# Patient Record
Sex: Female | Born: 1945
Health system: Southern US, Community
[De-identification: ages and names within clinical notes are randomized; demographics above are authoritative.]

## PROBLEM LIST (undated history)

## (undated) DIAGNOSIS — I10 Essential (primary) hypertension: Secondary | ICD-10-CM

## (undated) DIAGNOSIS — E785 Hyperlipidemia, unspecified: Secondary | ICD-10-CM

## (undated) DIAGNOSIS — S82891A Other fracture of right lower leg, initial encounter for closed fracture: Secondary | ICD-10-CM

## (undated) DIAGNOSIS — M8580 Other specified disorders of bone density and structure, unspecified site: Secondary | ICD-10-CM

## (undated) DIAGNOSIS — M109 Gout, unspecified: Secondary | ICD-10-CM

## (undated) DIAGNOSIS — E119 Type 2 diabetes mellitus without complications: Secondary | ICD-10-CM

## (undated) HISTORY — DX: Type 2 diabetes mellitus without complications: E11.9

## (undated) HISTORY — DX: Essential (primary) hypertension: I10

## (undated) HISTORY — DX: Other fracture of right lower leg, initial encounter for closed fracture: S82.891A

## (undated) HISTORY — DX: Other specified disorders of bone density and structure, unspecified site: M85.80

## (undated) HISTORY — PX: ABDOMINAL HYSTERECTOMY: SHX81

## (undated) HISTORY — DX: Hyperlipidemia, unspecified: E78.5

## (undated) HISTORY — DX: Gout, unspecified: M10.9

---

## 1998-11-10 ENCOUNTER — Other Ambulatory Visit: Admission: RE | Admit: 1998-11-10 | Discharge: 1998-11-10 | Payer: Self-pay

## 1999-05-25 ENCOUNTER — Ambulatory Visit (HOSPITAL_COMMUNITY): Admission: RE | Admit: 1999-05-25 | Discharge: 1999-05-25 | Payer: Self-pay | Admitting: General Surgery

## 1999-05-25 ENCOUNTER — Encounter: Payer: Self-pay | Admitting: General Surgery

## 2001-12-09 ENCOUNTER — Inpatient Hospital Stay (HOSPITAL_COMMUNITY): Admission: EM | Admit: 2001-12-09 | Discharge: 2001-12-12 | Payer: Self-pay | Admitting: *Deleted

## 2001-12-09 ENCOUNTER — Encounter: Payer: Self-pay | Admitting: Cardiology

## 2001-12-11 ENCOUNTER — Encounter: Payer: Self-pay | Admitting: Cardiology

## 2003-12-13 ENCOUNTER — Other Ambulatory Visit: Admission: RE | Admit: 2003-12-13 | Discharge: 2003-12-13 | Payer: Self-pay | Admitting: Family Medicine

## 2004-12-31 DIAGNOSIS — M109 Gout, unspecified: Secondary | ICD-10-CM

## 2004-12-31 HISTORY — DX: Gout, unspecified: M10.9

## 2013-05-15 ENCOUNTER — Other Ambulatory Visit: Payer: Self-pay | Admitting: *Deleted

## 2013-05-15 MED ORDER — ALLOPURINOL 300 MG PO TABS: 300.0000 mg | ORAL_TABLET | Freq: Every day | ORAL | Status: DC

## 2013-05-18 MED ORDER — ALLOPURINOL 300 MG PO TABS: 300.0000 mg | ORAL_TABLET | Freq: Every day | ORAL | Status: DC

## 2013-05-21 ENCOUNTER — Other Ambulatory Visit: Payer: Self-pay

## 2013-05-21 MED ORDER — ALLOPURINOL 300 MG PO TABS: 300.0000 mg | ORAL_TABLET | Freq: Every day | ORAL | Status: DC

## 2013-06-27 ENCOUNTER — Other Ambulatory Visit: Payer: Self-pay | Admitting: Physician Assistant

## 2013-06-29 ENCOUNTER — Other Ambulatory Visit: Payer: Self-pay

## 2013-06-29 NOTE — Telephone Encounter (Signed)
Rf denied. Pt needs appt Says seen by me on 6/13 and I was not in office that day and no record in chart

## 2013-06-29 NOTE — Telephone Encounter (Signed)
Last seen 6/13  [facility]

## 2013-07-07 ENCOUNTER — Other Ambulatory Visit: Payer: Self-pay | Admitting: *Deleted

## 2013-09-23 ENCOUNTER — Other Ambulatory Visit: Payer: Self-pay | Admitting: *Deleted

## 2013-09-23 MED ORDER — LISINOPRIL 20 MG PO TABS: ORAL_TABLET | ORAL | Status: DC

## 2013-09-23 NOTE — Telephone Encounter (Signed)
LAST OV 06/05/12. NTBS. MED NOT IN EPIC BUT IN PAPER CHART FOR ZESTRIL 20MG  ONE TABLET TWICE DAILY. THANKS.

## 2013-10-22 ENCOUNTER — Encounter: Payer: Self-pay | Admitting: Nurse Practitioner

## 2013-10-22 ENCOUNTER — Ambulatory Visit (INDEPENDENT_AMBULATORY_CARE_PROVIDER_SITE_OTHER): Payer: Medicare Other | Admitting: Nurse Practitioner

## 2013-10-22 ENCOUNTER — Encounter (INDEPENDENT_AMBULATORY_CARE_PROVIDER_SITE_OTHER): Payer: Self-pay

## 2013-10-22 VITALS — BP 156/86 | HR 70 | Temp 97.9°F | Ht 64.0 in | Wt 199.0 lb

## 2013-10-22 DIAGNOSIS — I10 Essential (primary) hypertension: Secondary | ICD-10-CM

## 2013-10-22 DIAGNOSIS — E785 Hyperlipidemia, unspecified: Secondary | ICD-10-CM

## 2013-10-22 DIAGNOSIS — R7309 Other abnormal glucose: Secondary | ICD-10-CM

## 2013-10-22 DIAGNOSIS — E559 Vitamin D deficiency, unspecified: Secondary | ICD-10-CM

## 2013-10-22 DIAGNOSIS — M109 Gout, unspecified: Secondary | ICD-10-CM

## 2013-10-22 DIAGNOSIS — R7989 Other specified abnormal findings of blood chemistry: Secondary | ICD-10-CM

## 2013-10-22 MED ORDER — LISINOPRIL 40 MG PO TABS: 40.0000 mg | ORAL_TABLET | Freq: Every day | ORAL | Status: DC

## 2013-10-22 MED ORDER — ALLOPURINOL 300 MG PO TABS: 300.0000 mg | ORAL_TABLET | Freq: Every day | ORAL | Status: DC

## 2013-10-22 MED ORDER — FENOFIBRATE 145 MG PO TABS: 145.0000 mg | ORAL_TABLET | Freq: Every day | ORAL | Status: DC

## 2013-10-22 NOTE — Progress Notes (Signed)
Subjective:    Patient ID: Katelyn Ballard, female    DOB: September 03, 1945, 68 y.o.   MRN: [id number]  Hyperlipidemia This is a chronic problem. The current episode started more than 1 year ago. The problem is uncontrolled. Recent lipid tests were reviewed and are high. Exacerbating diseases include obesity. She has no history of diabetes or liver disease. Factors aggravating her hyperlipidemia include fatty foods. Pertinent negatives include no focal sensory loss, leg pain, myalgias or shortness of breath. Current antihyperlipidemic treatment includes fibric acid derivatives. The current treatment provides mild improvement of lipids. Risk factors for coronary artery disease include hypertension, obesity, post-menopausal and dyslipidemia.  Hypertension This is a chronic problem. The current episode started more than 1 year ago. The problem has been waxing and waning since onset. The problem is uncontrolled. Pertinent negatives include no anxiety, palpitations, peripheral edema or shortness of breath. Risk factors for coronary artery disease include obesity, post-menopausal state, dyslipidemia and family history. Past treatments include ACE inhibitors (patient suppose to beon 2 tablets a day an dhas only been taking one because afraid she would run out of meds.). The current treatment provides mild improvement. There is no history of a thyroid problem. There is no history of sleep apnea.   Gout Pt takes Allopurinol daily-Pt states her left pinky finger is slightly tender and pain in left ankle that feels like her "gout acting up". Pt has been out of medication for the last two months.   Review of Systems  Respiratory: Negative for shortness of breath.   Cardiovascular: Negative for palpitations.  Musculoskeletal: Negative for myalgias.  All other systems reviewed and are negative.       Objective:   Physical Exam  Vitals reviewed. Constitutional: She is oriented to person, place, and time. She  appears well-developed and well-nourished.  HENT:  Head: Normocephalic.  Right Ear: External ear normal.  Left Ear: External ear normal.  Mouth/Throat: Oropharynx is clear and moist.  Eyes: Pupils are equal, round, and reactive to light.  Neck: Normal range of motion. Neck supple. No thyromegaly present.  Cardiovascular: Normal rate, regular rhythm, normal heart sounds and intact distal pulses.   Pulmonary/Chest: Effort normal and breath sounds normal.  Abdominal: Soft. Bowel sounds are normal. She exhibits no distension. There is no tenderness.  Musculoskeletal: Normal range of motion. She exhibits no edema and no tenderness.  Neurological: She is alert and oriented to person, place, and time.  Skin: Skin is warm and dry.  Psychiatric: She has a normal mood and affect. Her behavior is normal. Judgment and thought content normal.     BP 156/86  Pulse 70  Temp(Src) 97.9 F (36.6 C) (Oral)  Ht 5\' 4"  (1.626 m)  Wt 199 lb (90.266 kg)  BMI 34.14 kg/m2       Assessment & Plan:   1. Hypertension   2. Hyperlipidemia LDL goal < 100   3. Gout   4. Unspecified vitamin D deficiency    Orders Placed This Encounter  Procedures  . CMP14+EGFR  . NMR, lipoprofile  . Vit D  25 hydroxy (rtn osteoporosis monitoring)   Meds ordered this encounter  Medications  . DISCONTD: fenofibrate (TRICOR) 145 MG tablet    Sig: Take 145 mg by mouth daily.  Marland Kitchen allopurinol (ZYLOPRIM) 300 MG tablet    Sig: Take 1 tablet (300 mg total) by mouth daily.    Dispense:  90 tablet    Refill:  1    Order Specific Question:  Supervising  Provider    Answer:  Ernestina Penna [[id number]]  . fenofibrate (TRICOR) 145 MG tablet    Sig: Take 1 tablet (145 mg total) by mouth daily.    Dispense:  90 tablet    Refill:  1    Order Specific Question:  Supervising Provider    Answer:  Ernestina Penna [[id number]]  . lisinopril (PRINIVIL,ZESTRIL) 40 MG tablet    Sig: Take 1 tablet (40 mg total) by mouth daily.    Dispense:  90  tablet    Refill:  1    Order Specific Question:  Supervising Provider    Answer:  Deborra Medina    Continue all meds Labs pending Diet and exercise encouraged Health maintenance reviewed Follow up in 3 months  Mary-Margaret Daphine Deutscher, FNP

## 2013-10-22 NOTE — Patient Instructions (Signed)

## 2013-10-23 LAB — CMP14+EGFR
Albumin: 4.2 g/dL (ref 3.6–4.8)
Alkaline Phosphatase: 79 IU/L (ref 39–117)
BUN/Creatinine Ratio: 27 — ABNORMAL HIGH (ref 11–26)
BUN: 20 mg/dL (ref 8–27)
CO2: 21 mmol/L (ref 18–29)
Calcium: 9.6 mg/dL (ref 8.6–10.2)
Chloride: 102 mmol/L (ref 97–108)
Creatinine, Ser: 0.73 mg/dL (ref 0.57–1.00)
GFR calc Af Amer: 97 mL/min/{1.73_m2} (ref >59)
Globulin, Total: 2.9 g/dL (ref 1.5–4.5)
Glucose: 229 mg/dL — ABNORMAL HIGH (ref 65–99)
Total Bilirubin: 0.3 mg/dL (ref 0.0–1.2)
Total Protein: 7.1 g/dL (ref 6.0–8.5)

## 2013-10-23 LAB — NMR, LIPOPROFILE
Cholesterol: 239 mg/dL — ABNORMAL HIGH (ref <200)
HDL Particle Number: 33.4 umol/L (ref >=30.5)
LDLC SERPL CALC-MCNC: 152 mg/dL — ABNORMAL HIGH (ref <100)
LP-IR Score: 59 — ABNORMAL HIGH (ref <=45)
Triglycerides by NMR: 264 mg/dL — ABNORMAL HIGH (ref <150)

## 2013-10-26 ENCOUNTER — Other Ambulatory Visit: Payer: Self-pay | Admitting: Nurse Practitioner

## 2013-10-26 MED ORDER — VITAMIN D (ERGOCALCIFEROL) 1.25 MG (50000 UNIT) PO CAPS: 50000.0000 [IU] | ORAL_CAPSULE | ORAL | Status: DC

## 2013-10-26 MED ORDER — ATORVASTATIN CALCIUM 40 MG PO TABS: 40.0000 mg | ORAL_TABLET | Freq: Every day | ORAL | Status: DC

## 2013-10-27 ENCOUNTER — Telehealth: Payer: Self-pay | Admitting: Nurse Practitioner

## 2013-10-27 NOTE — Telephone Encounter (Signed)
What was on before- no other statin in chart

## 2013-10-31 ENCOUNTER — Telehealth: Payer: Self-pay | Admitting: Nurse Practitioner

## 2013-11-02 NOTE — Telephone Encounter (Signed)
Patient on tricor not antara according to chart which is she taking?

## 2013-11-03 ENCOUNTER — Other Ambulatory Visit: Payer: Self-pay | Admitting: Nurse Practitioner

## 2013-11-03 NOTE — Telephone Encounter (Signed)
Left details on vm. Ask her to call back with medication list and name of provider who prescribed for her,( Antara.)

## 2013-11-04 ENCOUNTER — Other Ambulatory Visit: Payer: Self-pay | Admitting: Nurse Practitioner

## 2013-11-04 MED ORDER — METFORMIN HCL 500 MG PO TABS: 500.0000 mg | ORAL_TABLET | Freq: Two times a day (BID) | ORAL | Status: DC

## 2013-11-04 NOTE — Addendum Note (Signed)
Addended by: Orma Render F on: 11/04/2013 08:31 AM   Modules accepted: Orders

## 2013-11-24 ENCOUNTER — Ambulatory Visit (INDEPENDENT_AMBULATORY_CARE_PROVIDER_SITE_OTHER): Payer: Medicare Other | Admitting: Pharmacist Clinician (PhC)/ Clinical Pharmacy Specialist

## 2013-11-24 DIAGNOSIS — E785 Hyperlipidemia, unspecified: Secondary | ICD-10-CM

## 2013-11-24 DIAGNOSIS — E1065 Type 1 diabetes mellitus with hyperglycemia: Secondary | ICD-10-CM

## 2013-11-24 DIAGNOSIS — IMO0002 Reserved for concepts with insufficient information to code with codable children: Secondary | ICD-10-CM

## 2013-11-24 NOTE — Progress Notes (Signed)
Subjective:    Patient ID: Katelyn Ballard, female    DOB: 29-Jun-1945, 68 y.o.   MRN: [id number]  HPI:  Diagnosed with type 2 DM one month ago.  Mother passed away from complications secondary to diabetes.  Strong family history present.    Review of Systems  Constitutional: Negative.   HENT: Negative.   Eyes: Negative.   Respiratory: Negative.   Cardiovascular: Negative.   Endocrine: Positive for polydipsia and polyuria.  Musculoskeletal: Positive for myalgias.  Skin: Negative.   Allergic/Immunologic: Negative.   Neurological: Negative.        Objective:   Physical Exam  Constitutional: She is oriented to person, place, and time. She appears well-developed and well-nourished.  Cardiovascular: Normal rate and regular rhythm.   Pulmonary/Chest: Effort normal and breath sounds normal.  Neurological: She is alert and oriented to person, place, and time.  Skin: Skin is warm and dry.  Psychiatric: She has a normal mood and affect. Her behavior is normal. Judgment and thought content normal.          Assessment & Plan:   Diabetes Follow-Up Visit Chief Complaint:  No chief complaint on file.    Exam Regularity:  RRR Edema:  neg  Polyuria:  pos  Polydipsia:  pos Polyphagia:  neg  BMI:  There is no weight on file to calculate BMI.   Weight changes:  Stable obese General Appearance:  alert, oriented, no acute distress and obese Mood/Affect:  normal  HPI:  New onset type 2 DM  Low fat/carbohydrate diet?  No Nicotine Abuse?  No Medication Compliance?  Yes Exercise?  No Alcohol Abuse?  No  Home BG Monitoring:  Checking 0 times a day. Average:  n/a  High: n/a  Low:  N/a    Lab Results  Component Value Date   HGBA1C 9.2% 11/04/2013    No results found for this basename: MICROALBUR, [id number]    Lab Results  Component Value Date   CHOL 239* 10/22/2013      Assessment: 1.  Diabetes:  Newly Diagnosed and knows very little about diabetes. 2.  Blood Pressure. At  goal and taking an ACEI 3.  Lipids.  Elevated LDL-P on fenofibrate.  Will need evaluation for statin therapy at next visit 4.  Foot Care.  Reviewed daily foot care with patient.  No current problems 5.  Dental Care.  Some missing teeth needs to see a dentist 6.  Eye Care/Exam.  Recommended annual exams  Recommendations: 1.  Patient is counseled on appropriate foot care. 2.  BP goal < 130/80. 3.  LDL goal of < 100, HDL > 40 and TG < 150. 4.  Eye Exam yearly and Dental Exam every 6 months. 5.  Dietary recommendations:  1800 calorie diet to start with.  Extensively counseled patient on CHO and meal planning for over 30 minutes. 6.  Physical Activity recommendations:  Patient has gout and foot problems.  Will do chair exercises 15 minutes bid 7.  Medication recommendations at this time are as follows:  Continue metformin 500mg  bid.  Patient to keep a food diary with blood glucose readings and bring in to me in two weeks for follow up and evaluation of changes that need to be made.  She was given a Patent examiner today with teaching. 8.  Return to clinic in 2 wks   Time spent counseling patient:  45 min  Physician time spent with patient:    Referring provider:  Daphine Deutscher   PharmD:  [facility] Pharmacist

## 2013-12-15 ENCOUNTER — Ambulatory Visit (INDEPENDENT_AMBULATORY_CARE_PROVIDER_SITE_OTHER): Payer: Medicare Other | Admitting: Pharmacist Clinician (PhC)/ Clinical Pharmacy Specialist

## 2013-12-15 ENCOUNTER — Encounter: Payer: Self-pay | Admitting: Pharmacist Clinician (PhC)/ Clinical Pharmacy Specialist

## 2013-12-15 ENCOUNTER — Telehealth: Payer: Self-pay | Admitting: Pharmacist Clinician (PhC)/ Clinical Pharmacy Specialist

## 2013-12-15 VITALS — BP 155/72 | Wt 192.0 lb

## 2013-12-15 DIAGNOSIS — E785 Hyperlipidemia, unspecified: Secondary | ICD-10-CM

## 2013-12-15 DIAGNOSIS — I10 Essential (primary) hypertension: Secondary | ICD-10-CM

## 2013-12-15 DIAGNOSIS — E119 Type 2 diabetes mellitus without complications: Secondary | ICD-10-CM

## 2013-12-15 MED ORDER — AMLODIPINE BESYLATE 2.5 MG PO TABS: 2.5000 mg | ORAL_TABLET | Freq: Every day | ORAL | Status: DC

## 2013-12-15 NOTE — Addendum Note (Signed)
Addended by: Orma Render F on: 12/15/2013 11:25 AM   Modules accepted: Orders

## 2013-12-15 NOTE — Progress Notes (Addendum)
   Subjective:    Patient ID: Katelyn Ballard, female    DOB: [birth year], 68 y.o.   MRN: [id number]  HPI:  New Diabetic follow up today.  Patient did not bring food diary or blood glucose readings.    Review of Systems  Constitutional: Negative.   HENT: Negative.   Eyes: Negative.   Respiratory: Negative.   Gastrointestinal: Negative.   Musculoskeletal: Negative.   Skin: Negative.   Neurological: Negative.   Psychiatric/Behavioral: Negative.        Objective:   Physical Exam  Constitutional: She is oriented to person, place, and time. She appears well-developed and well-nourished.  Neurological: She is alert and oriented to person, place, and time.  Skin: Skin is warm and dry.  Psychiatric: She has a normal mood and affect. Her behavior is normal. Judgment and thought content normal.          Assessment & Plan:   Diabetes Follow-Up Visit Chief Complaint:  No chief complaint on file.    Exam Regularity:  RRR Neg  Edema:  neg  Polyuria:  Neg   Polydipsia:  neg Polyphagia:  neg  BMI:  There is no weight on file to calculate BMI.   Weight changes:  stable General Appearance:  alert, oriented, no acute distress Mood/Affect:  normal  HPI:  New onset type 2 diabetes mellitus  Low fat/carbohydrate diet?  Yes Nicotine Abuse?  No Medication Compliance?  Yes Exercise?  No Alcohol Abuse?  No  Home BG Monitoring:  Checking 2 times a day. Average:  145mg /dL  High: 235mg /dL  Low:  111mg /dL   Lab Results  Component Value Date   HGBA1C 9.2% 11/04/2013    No results found for this basename: MICROALBUR, [id number]    Lab Results  Component Value Date   CHOL 239* 10/22/2013      Assessment: 1.  Diabetes.  Huge improvement in diet and CHO monitoring  2.  Blood Pressure.  At goal 3.  Lipids.  Pending needs to be checked  4.  Foot Care.  Reviewed daily care 5.  Dental Care.  annaul 6.  Eye Care/Exam.  annual  Recommendations: 1.  Patient is counseled on  appropriate foot care. 2.  BP goal < 130/80. 3.  LDL goal of < 100, HDL > 40 and TG < 150. 4.  Eye Exam yearly and Dental Exam every 6 months. 5.  Dietary recommendations:  Continue CHO meal planning 6.  Physical Activity recommendations:  15-20 minutes of exercise a day 7.  Medication recommendations at this time are as follows:  Continue same 8.  Return to clinic in 4-6 wks 9.  Will re-check lipids in January and start statin therapy if elevated.  Plan to use pravastatin since she has had myalgias with lipitor  And crestor. 10.  Added amlodipine 2.5mg  since systolic readings are staying between 155-162.  Diastolics are 70-75.   Time spent counseling patient:  35 minutes  Physician time spent with patient:  0 Referring provider:  Gennette Pac   PharmD:  [facility] Pharmacist

## 2013-12-17 LAB — NMR, LIPOPROFILE
Cholesterol: 175 mg/dL (ref <200)
HDL Cholesterol by NMR: 38 mg/dL — ABNORMAL LOW (ref >=40)
HDL Particle Number: 30.6 umol/L (ref >=30.5)
LDL Particle Number: 1905 nmol/L — ABNORMAL HIGH (ref <1000)
LDL Size: 20.7 nm (ref >20.5)
LDLC SERPL CALC-MCNC: 108 mg/dL — ABNORMAL HIGH (ref <100)
LP-IR Score: 58 — ABNORMAL HIGH (ref <=45)
Small LDL Particle Number: 873 nmol/L — ABNORMAL HIGH (ref <=527)
Triglycerides by NMR: 146 mg/dL (ref <150)

## 2013-12-17 LAB — HEMOGLOBIN A1C
Est. average glucose Bld gHb Est-mCnc: 203 mg/dL
Hgb A1c MFr Bld: 8.7 % — ABNORMAL HIGH (ref 4.8–5.6)

## 2013-12-17 LAB — BASIC METABOLIC PANEL
BUN/Creatinine Ratio: 27 — ABNORMAL HIGH (ref 11–26)
Calcium: 9.2 mg/dL (ref 8.6–10.2)
Chloride: 108 mmol/L (ref 97–108)
GFR calc non Af Amer: 70 mL/min/{1.73_m2} (ref >59)
Glucose: 119 mg/dL — ABNORMAL HIGH (ref 65–99)
Potassium: 4.1 mmol/L (ref 3.5–5.2)

## 2013-12-22 NOTE — Telephone Encounter (Signed)
Excellent improvement in all lab parameters.  A1C droppped after only one month since last check.  Lipids significantly improved since last check.  Will continue diet and medications and re-check in 10-12 weeks

## 2014-01-22 ENCOUNTER — Encounter: Payer: Self-pay | Admitting: Nurse Practitioner

## 2014-01-22 ENCOUNTER — Ambulatory Visit (INDEPENDENT_AMBULATORY_CARE_PROVIDER_SITE_OTHER): Payer: Medicare Other | Admitting: Nurse Practitioner

## 2014-01-22 ENCOUNTER — Encounter (INDEPENDENT_AMBULATORY_CARE_PROVIDER_SITE_OTHER): Payer: Self-pay

## 2014-01-22 VITALS — BP 146/79 | HR 65 | Temp 98.1°F | Ht 64.0 in | Wt 186.0 lb

## 2014-01-22 DIAGNOSIS — E785 Hyperlipidemia, unspecified: Secondary | ICD-10-CM

## 2014-01-22 DIAGNOSIS — M109 Gout, unspecified: Secondary | ICD-10-CM

## 2014-01-22 DIAGNOSIS — I10 Essential (primary) hypertension: Secondary | ICD-10-CM

## 2014-01-22 MED ORDER — ALLOPURINOL 300 MG PO TABS: 300.0000 mg | ORAL_TABLET | Freq: Every day | ORAL | Status: DC

## 2014-01-22 MED ORDER — FENOFIBRATE 145 MG PO TABS: 145.0000 mg | ORAL_TABLET | Freq: Every day | ORAL | Status: DC

## 2014-01-22 MED ORDER — METFORMIN HCL 500 MG PO TABS: 500.0000 mg | ORAL_TABLET | Freq: Two times a day (BID) | ORAL | Status: DC

## 2014-01-22 MED ORDER — AMLODIPINE BESYLATE 2.5 MG PO TABS: 2.5000 mg | ORAL_TABLET | Freq: Every day | ORAL | Status: DC

## 2014-01-22 MED ORDER — LISINOPRIL 40 MG PO TABS: 40.0000 mg | ORAL_TABLET | Freq: Every day | ORAL | Status: DC

## 2014-01-22 NOTE — Progress Notes (Signed)
Subjective:    Patient ID: Katelyn Ballard, female    DOB: 1945/02/09, 69 y.o.   MRN: [id number]  Patient here today for follow up- no complaints and no changes since last visit.  Hyperlipidemia This is a chronic problem. The current episode started more than 1 year ago. The problem is uncontrolled. Recent lipid tests were reviewed and are high. Exacerbating diseases include obesity. She has no history of diabetes or liver disease. Factors aggravating her hyperlipidemia include fatty foods. Pertinent negatives include no chest pain, focal sensory loss, leg pain, myalgias or shortness of breath. Current antihyperlipidemic treatment includes fibric acid derivatives. The current treatment provides mild improvement of lipids. Risk factors for coronary artery disease include hypertension, obesity, post-menopausal and dyslipidemia.  Hypertension This is a chronic problem. The current episode started more than 1 year ago. The problem has been waxing and waning since onset. The problem is uncontrolled. Pertinent negatives include no anxiety, chest pain, palpitations, peripheral edema or shortness of breath. Risk factors for coronary artery disease include obesity, post-menopausal state, dyslipidemia and family history. Past treatments include ACE inhibitors (patient suppose to beon 2 tablets a day an dhas only been taking one because afraid she would run out of meds.). The current treatment provides mild improvement. There is no history of a thyroid problem. There is no history of sleep apnea.  Diabetes She presents for her follow-up diabetic visit. She has type 2 diabetes mellitus. No MedicAlert identification noted. Her disease course has been stable. There are no hypoglycemic associated symptoms. Pertinent negatives for diabetes include no chest pain, no foot paresthesias, no polydipsia, no polyphagia, no polyuria, no visual change and no weight loss. There are no hypoglycemic complications. Symptoms are  stable. There are no diabetic complications. Risk factors for coronary artery disease include diabetes mellitus, dyslipidemia, family history, hypertension and post-menopausal. Current diabetic treatment includes oral agent (monotherapy). She is compliant with treatment most of the time. She has not had a previous visit with a dietician. She rarely participates in exercise. There is no change in her home blood glucose trend. Her breakfast blood glucose is taken between 8-9 am. Her breakfast blood glucose range is generally 110-130 mg/dl. An ACE inhibitor/angiotensin II receptor blocker is being taken. She does not see a podiatrist.Eye exam is not current.  GOUT Taking allopurinol daily- no recent flareups     Review of Systems  Constitutional: Negative for weight loss.  Respiratory: Negative for shortness of breath.   Cardiovascular: Negative for chest pain and palpitations.  Endocrine: Negative for polydipsia, polyphagia and polyuria.  Musculoskeletal: Negative for myalgias.  All other systems reviewed and are negative.       Objective:   Physical Exam  Vitals reviewed. Constitutional: She is oriented to person, place, and time. She appears well-developed and well-nourished.  HENT:  Head: Normocephalic.  Right Ear: External ear normal.  Left Ear: External ear normal.  Mouth/Throat: Oropharynx is clear and moist.  Eyes: Pupils are equal, round, and reactive to light.  Neck: Normal range of motion. Neck supple. No thyromegaly present.  Cardiovascular: Normal rate, regular rhythm, normal heart sounds and intact distal pulses.   Pulmonary/Chest: Effort normal and breath sounds normal.  Abdominal: Soft. Bowel sounds are normal. She exhibits no distension. There is no tenderness.  Musculoskeletal: Normal range of motion. She exhibits no edema and no tenderness.  Neurological: She is alert and oriented to person, place, and time.  Skin: Skin is warm and dry.  Psychiatric: She has a normal  mood and affect. Her behavior is normal. Judgment and thought content normal.     BP 146/79  Pulse 65  Temp(Src) 98.1 F (36.7 C) (Oral)  Ht 5\' 4"  (1.626 m)  Wt 186 lb (84.369 kg)  BMI 31.91 kg/m2       Assessment & Plan:    1. Hypertension   2. Hyperlipidemia LDL goal < 100   3. Gout     Meds ordered this encounter  Medications  . fenofibrate (TRICOR) 145 MG tablet    Sig: Take 1 tablet (145 mg total) by mouth daily.    Dispense:  90 tablet    Refill:  1    Order Specific Question:  Supervising Provider    Answer:  Ernestina PennaMOORE, DONALD W [[id number]]  . amLODipine (NORVASC) 2.5 MG tablet    Sig: Take 1 tablet (2.5 mg total) by mouth daily.    Dispense:  90 tablet    Refill:  1    Order Specific Question:  Supervising Provider    Answer:  Ernestina PennaMOORE, DONALD W [[id number]]  . allopurinol (ZYLOPRIM) 300 MG tablet    Sig: Take 1 tablet (300 mg total) by mouth daily.    Dispense:  90 tablet    Refill:  1    Order Specific Question:  Supervising Provider    Answer:  Ernestina PennaMOORE, DONALD W [[id number]]  . lisinopril (PRINIVIL,ZESTRIL) 40 MG tablet    Sig: Take 1 tablet (40 mg total) by mouth daily.    Dispense:  90 tablet    Refill:  1    Order Specific Question:  Supervising Provider    Answer:  Ernestina PennaMOORE, DONALD W [[id number]]  . metFORMIN (GLUCOPHAGE) 500 MG tablet    Sig: Take 1 tablet (500 mg total) by mouth 2 (two) times daily with a meal.    Dispense:  180 tablet    Refill:  1    Order Specific Question:  Supervising Provider    Answer:  Ernestina PennaMOORE, DONALD W [[id number]]    Labs discussed at appointment Because hgba1c trending down- no changes o meds today - will recheck in 3 months Health maintenance reviewed Diet and exercise encouraged Continue all meds Follow up  In 3 months   Mary-Margaret Daphine DeutscherMartin, FNP

## 2014-01-22 NOTE — Patient Instructions (Signed)

## 2014-02-10 ENCOUNTER — Ambulatory Visit (INDEPENDENT_AMBULATORY_CARE_PROVIDER_SITE_OTHER): Payer: Medicare Other | Admitting: Nurse Practitioner

## 2014-02-10 ENCOUNTER — Encounter (INDEPENDENT_AMBULATORY_CARE_PROVIDER_SITE_OTHER): Payer: Medicare Other

## 2014-02-10 ENCOUNTER — Encounter: Payer: Self-pay | Admitting: Nurse Practitioner

## 2014-02-10 VITALS — BP 153/83 | HR 74 | Temp 98.9°F | Ht 65.0 in | Wt 190.0 lb

## 2014-02-10 DIAGNOSIS — J209 Acute bronchitis, unspecified: Secondary | ICD-10-CM

## 2014-02-10 MED ORDER — HYDROCODONE-HOMATROPINE 5-1.5 MG/5ML PO SYRP: 5.0000 mL | ORAL_SOLUTION | Freq: Three times a day (TID) | ORAL | Status: DC | PRN

## 2014-02-10 MED ORDER — AZITHROMYCIN 250 MG PO TABS
ORAL_TABLET | ORAL | Status: DC
Start: 2014-02-10 — End: 2014-05-03

## 2014-02-10 NOTE — Progress Notes (Signed)
   Subjective:    Patient ID: Katelyn Katelyn, female    DOB: 07/27/1945, 69 y.o.   MRN: [id number]  HPI Patyient in c/o cough that is worse at night- productive at times- started 3 days ago. Mucinex OTC  Helps some.    Review of Systems  Constitutional: Negative for fever, chills and appetite change.  HENT: Positive for congestion, ear pain and rhinorrhea. Negative for sore throat.   Respiratory: Positive for cough (productive at times).   Cardiovascular: Negative.   Gastrointestinal: Negative.   All other systems reviewed and are negative.       Objective:   Physical Exam  Constitutional: She is oriented to person, place, and time. She appears well-developed and well-nourished.  HENT:  Right Ear: Hearing, tympanic membrane, external ear and ear canal normal.  Left Ear: Hearing, tympanic membrane, external ear and ear canal normal.  Nose: Mucosal edema and rhinorrhea present. Right sinus exhibits no maxillary sinus tenderness and no frontal sinus tenderness. Left sinus exhibits no maxillary sinus tenderness and no frontal sinus tenderness.  Mouth/Throat: Uvula is midline, oropharynx is clear and moist and mucous membranes are normal.  Neck: Normal range of motion. Neck supple.  Cardiovascular: Normal rate, regular rhythm and normal heart sounds.   Pulmonary/Chest: Effort normal and breath sounds normal.  Lymphadenopathy:    She has no cervical adenopathy.  Neurological: She is alert and oriented to person, place, and time.  Skin: Skin is warm and dry.  Psychiatric: She has a normal mood and affect. Her behavior is normal. Judgment and thought content normal.   BP 153/83  Pulse 74  Temp(Src) 98.9 F (37.2 C) (Oral)  Ht 5\' 5"  (1.651 m)  Wt 190 lb (86.183 kg)  BMI 31.62 kg/m2        Assessment & Plan:   1. Acute bronchitis    Meds ordered this encounter  Medications  . azithromycin (ZITHROMAX Z-PAK) 250 MG tablet    Sig: As directed    Dispense:  6 each   Refill:  0    Order Specific Question:  Supervising Provider    Answer:  Ernestina PennaMOORE, DONALD W [[id number]]  . HYDROcodone-homatropine (HYCODAN) 5-1.5 MG/5ML syrup    Sig: Take 5 mLs by mouth every 8 (eight) hours as needed for cough.    Dispense:  120 mL    Refill:  0    Order Specific Question:  Supervising Provider    Answer:  Ernestina PennaMOORE, DONALD W [[id number]]   1. Take meds as prescribed 2. Use a cool mist humidifier especially during the winter months and when heat has been humid. 3. Use saline nose sprays frequently 4. Saline irrigations of the nose can be very helpful if done frequently.  * 4X daily for 1 week*  * Use of a nettie pot can be helpful with this. Follow directions with this* 5. Drink plenty of fluids 6. Keep thermostat turn down low 7.For any cough or congestion  Use plain Mucinex- regular strength or max strength is fine   * Children- consult with Pharmacist for dosing 8. For fever or aces or pains- take tylenol or ibuprofen appropriate for age and weight.  * for fevers greater than 101 orally you may alternate ibuprofen and tylenol every  3 hours.   Mary-Margaret Daphine DeutscherMartin, FNP

## 2014-02-10 NOTE — Patient Instructions (Signed)

## 2014-04-23 ENCOUNTER — Other Ambulatory Visit: Payer: Self-pay | Admitting: Nurse Practitioner

## 2014-04-26 NOTE — Telephone Encounter (Signed)
Last seen 02/10/14 MMM  Last Vit D level 10/22/13 14.8 low

## 2014-05-03 ENCOUNTER — Encounter: Payer: Self-pay | Admitting: Nurse Practitioner

## 2014-05-03 ENCOUNTER — Ambulatory Visit (INDEPENDENT_AMBULATORY_CARE_PROVIDER_SITE_OTHER): Payer: Medicare Other | Admitting: Nurse Practitioner

## 2014-05-03 VITALS — BP 139/84 | HR 71 | Temp 98.6°F | Ht 65.0 in | Wt 193.0 lb

## 2014-05-03 DIAGNOSIS — I10 Essential (primary) hypertension: Secondary | ICD-10-CM

## 2014-05-03 DIAGNOSIS — E119 Type 2 diabetes mellitus without complications: Secondary | ICD-10-CM | POA: Insufficient documentation

## 2014-05-03 DIAGNOSIS — M109 Gout, unspecified: Secondary | ICD-10-CM

## 2014-05-03 DIAGNOSIS — E559 Vitamin D deficiency, unspecified: Secondary | ICD-10-CM

## 2014-05-03 DIAGNOSIS — E785 Hyperlipidemia, unspecified: Secondary | ICD-10-CM

## 2014-05-03 DIAGNOSIS — L659 Nonscarring hair loss, unspecified: Secondary | ICD-10-CM

## 2014-05-03 LAB — POCT GLYCOSYLATED HEMOGLOBIN (HGB A1C): Hemoglobin A1C: 6.8

## 2014-05-03 MED ORDER — ALLOPURINOL 300 MG PO TABS: 300.0000 mg | ORAL_TABLET | Freq: Every day | ORAL | Status: DC

## 2014-05-03 MED ORDER — AMLODIPINE BESYLATE 2.5 MG PO TABS: 2.5000 mg | ORAL_TABLET | Freq: Every day | ORAL | Status: DC

## 2014-05-03 MED ORDER — FENOFIBRATE 145 MG PO TABS: 145.0000 mg | ORAL_TABLET | Freq: Every day | ORAL | Status: DC

## 2014-05-03 MED ORDER — METFORMIN HCL 500 MG PO TABS: 500.0000 mg | ORAL_TABLET | Freq: Two times a day (BID) | ORAL | Status: DC

## 2014-05-03 MED ORDER — LISINOPRIL 40 MG PO TABS: 40.0000 mg | ORAL_TABLET | Freq: Every day | ORAL | Status: DC

## 2014-05-03 MED ORDER — VITAMIN D (ERGOCALCIFEROL) 1.25 MG (50000 UNIT) PO CAPS: ORAL_CAPSULE | ORAL | Status: DC

## 2014-05-03 NOTE — Patient Instructions (Signed)

## 2014-05-03 NOTE — Progress Notes (Signed)
Subjective:    Patient ID: Katelyn Ballard, female    DOB: 07/27/1945, 69 y.o.   MRN: [id number]  Patient here today for follow up- no complaints and no changes since last visit.  Hyperlipidemia This is a chronic problem. The current episode started more than 1 year ago. The problem is uncontrolled. Recent lipid tests were reviewed and are high. Exacerbating diseases include obesity. She has no history of diabetes or liver disease. Factors aggravating her hyperlipidemia include fatty foods. Pertinent negatives include no chest pain, focal sensory loss, leg pain, myalgias or shortness of breath. Current antihyperlipidemic treatment includes fibric acid derivatives. The current treatment provides mild improvement of lipids. Risk factors for coronary artery disease include hypertension, obesity, post-menopausal and dyslipidemia.  Hypertension This is a chronic problem. The current episode started more than 1 year ago. The problem has been waxing and waning since onset. The problem is uncontrolled. Pertinent negatives include no anxiety, chest pain, palpitations, peripheral edema or shortness of breath. Risk factors for coronary artery disease include obesity, post-menopausal state, dyslipidemia and family history. Past treatments include ACE inhibitors (patient suppose to beon 2 tablets a day an dhas only been taking one because afraid she would run out of meds.). The current treatment provides mild improvement. There is no history of a thyroid problem. There is no history of sleep apnea.  Diabetes She presents for her follow-up diabetic visit. She has type 2 diabetes mellitus. No MedicAlert identification noted. Her disease course has been stable. There are no hypoglycemic associated symptoms. Pertinent negatives for diabetes include no chest pain, no foot paresthesias, no polydipsia, no polyphagia, no polyuria, no visual change and no weight loss. There are no hypoglycemic complications. Symptoms are  stable. There are no diabetic complications. Risk factors for coronary artery disease include diabetes mellitus, dyslipidemia, family history, hypertension and post-menopausal. Current diabetic treatment includes oral agent (monotherapy). She is compliant with treatment most of the time. Diabetic current diet: not watching diet. She has not had a previous visit with a dietician. She rarely participates in exercise. There is no change in her home blood glucose trend. Her breakfast blood glucose is taken between 8-9 am. Her breakfast blood glucose range is generally 140-180 mg/dl. An ACE inhibitor/angiotensin II receptor blocker is being taken. She does not see a podiatrist.Eye exam is not current.  GOUT Taking allopurinol daily- no recent flareups     Review of Systems  Constitutional: Negative for weight loss.  Respiratory: Negative for shortness of breath.   Cardiovascular: Negative for chest pain and palpitations.  Endocrine: Negative for polydipsia, polyphagia and polyuria.  Musculoskeletal: Negative for myalgias.  All other systems reviewed and are negative.      Objective:   Physical Exam  Vitals reviewed. Constitutional: She is oriented to person, place, and time. She appears well-developed and well-nourished.  HENT:  Head: Normocephalic.  Right Ear: External ear normal.  Left Ear: External ear normal.  Mouth/Throat: Oropharynx is clear and moist.  Eyes: Pupils are equal, round, and reactive to light.  Neck: Normal range of motion. Neck supple. No thyromegaly present.  Cardiovascular: Normal rate, regular rhythm, normal heart sounds and intact distal pulses.   Pulmonary/Chest: Effort normal and breath sounds normal.  Abdominal: Soft. Bowel sounds are normal. She exhibits no distension. There is no tenderness.  Musculoskeletal: Normal range of motion. She exhibits no edema and no tenderness.  Neurological: She is alert and oriented to person, place, and time.  Skin: Skin is warm  and dry.  Psychiatric: She has a normal mood and affect. Her behavior is normal. Judgment and thought content normal.     BP 139/84  Pulse 71  Temp(Src) 98.6 F (37 C) (Oral)  Ht 5' 5"  (1.651 m)  Wt 193 lb (87.544 kg)  BMI 32.12 kg/m2    Results for orders placed in visit on 05/03/14  POCT GLYCOSYLATED HEMOGLOBIN (HGB A1C)      Result Value Ref Range   Hemoglobin A1C 6.8%         Assessment & Plan:    1. Type II or unspecified type diabetes mellitus without mention of complication, not stated as uncontrolled   2. Hyperlipidemia LDL goal < 100   3. Hypertension   4. Gout   5. Hair loss   6. Unspecified vitamin D deficiency    Orders Placed This Encounter  Procedures  . CMP14+EGFR  . NMR, lipoprofile  . Thyroid Panel With TSH  . POCT glycosylated hemoglobin (Hb A1C)   Meds ordered this encounter  Medications  . allopurinol (ZYLOPRIM) 300 MG tablet    Sig: Take 1 tablet (300 mg total) by mouth daily.    Dispense:  90 tablet    Refill:  1    Order Specific Question:  Supervising Provider    Answer:  Chipper Herb [[id number]]  . amLODipine (NORVASC) 2.5 MG tablet    Sig: Take 1 tablet (2.5 mg total) by mouth daily.    Dispense:  90 tablet    Refill:  1    Order Specific Question:  Supervising Provider    Answer:  Chipper Herb [[id number]]  . fenofibrate (TRICOR) 145 MG tablet    Sig: Take 1 tablet (145 mg total) by mouth daily.    Dispense:  90 tablet    Refill:  1    Order Specific Question:  Supervising Provider    Answer:  Chipper Herb [[id number]]  . lisinopril (PRINIVIL,ZESTRIL) 40 MG tablet    Sig: Take 1 tablet (40 mg total) by mouth daily.    Dispense:  90 tablet    Refill:  1    Order Specific Question:  Supervising Provider    Answer:  Chipper Herb [[id number]]  . Vitamin D, Ergocalciferol, (DRISDOL) 50000 UNITS CAPS capsule    Sig: TAKE ONE CAPSULE BY MOUTH ONCE A WEEK    Dispense:  12 capsule    Refill:  1    Order Specific Question:  Supervising  Provider    Answer:  Chipper Herb [[id number]]  . metFORMIN (GLUCOPHAGE) 500 MG tablet    Sig: Take 1 tablet (500 mg total) by mouth 2 (two) times daily with a meal.    Dispense:  180 tablet    Refill:  1    Order Specific Question:  Supervising Provider    Answer:  Chipper Herb [[id number]]   Refuses colonoscopy Patient to make appointment for eye exam Labs pending Health maintenance reviewed Diet and exercise encouraged- need to watch carbs Continue all meds Follow up  In 3 months   Nubieber, FNP

## 2014-05-04 LAB — NMR, LIPOPROFILE
CHOLESTEROL: 136 mg/dL (ref <200)
HDL CHOLESTEROL BY NMR: 37 mg/dL — AB (ref >=40)
HDL PARTICLE NUMBER: 30.5 umol/L (ref >=30.5)
LDL PARTICLE NUMBER: 1351 nmol/L — AB (ref <1000)
LDL Size: 20.8 nm (ref >20.5)
LDLC SERPL CALC-MCNC: 79 mg/dL (ref <100)
LP-IR SCORE: 84 — AB (ref <=45)
Small LDL Particle Number: 778 nmol/L — ABNORMAL HIGH (ref <=527)
Triglycerides by NMR: 100 mg/dL (ref <150)

## 2014-05-04 LAB — CMP14+EGFR
ALBUMIN: 4 g/dL (ref 3.6–4.8)
ALK PHOS: 60 IU/L (ref 39–117)
ALT: 13 IU/L (ref 0–32)
AST: 10 IU/L (ref 0–40)
Albumin/Globulin Ratio: 1.5 (ref 1.1–2.5)
BUN / CREAT RATIO: 27 — AB (ref 11–26)
BUN: 20 mg/dL (ref 8–27)
CALCIUM: 9.1 mg/dL (ref 8.7–10.3)
CHLORIDE: 111 mmol/L — AB (ref 97–108)
CO2: 22 mmol/L (ref 18–29)
Creatinine, Ser: 0.75 mg/dL (ref 0.57–1.00)
GFR calc Af Amer: 94 mL/min/{1.73_m2} (ref >59)
GFR calc non Af Amer: 82 mL/min/{1.73_m2} (ref >59)
GLOBULIN, TOTAL: 2.7 g/dL (ref 1.5–4.5)
Glucose: 144 mg/dL — ABNORMAL HIGH (ref 65–99)
Potassium: 4.1 mmol/L (ref 3.5–5.2)
Sodium: 146 mmol/L — ABNORMAL HIGH (ref 134–144)
Total Bilirubin: 0.2 mg/dL (ref 0.0–1.2)
Total Protein: 6.7 g/dL (ref 6.0–8.5)

## 2014-05-04 LAB — THYROID PANEL WITH TSH
FREE THYROXINE INDEX: 2.2 (ref 1.2–4.9)
T3 Uptake Ratio: 29 % (ref 24–39)
T4 TOTAL: 7.6 ug/dL (ref 4.5–12.0)
TSH: 1.61 u[IU]/mL (ref 0.450–4.500)

## 2014-05-10 ENCOUNTER — Telehealth: Payer: Self-pay | Admitting: Nurse Practitioner

## 2014-05-11 NOTE — Telephone Encounter (Signed)
Left message to to call back also mailled copy

## 2014-06-10 ENCOUNTER — Other Ambulatory Visit: Payer: Self-pay

## 2014-06-10 NOTE — Telephone Encounter (Signed)
Last seen 05/03/14 MMM  This med not on EPIC list

## 2014-06-11 MED ORDER — IBUPROFEN 600 MG PO TABS: 600.0000 mg | ORAL_TABLET | Freq: Four times a day (QID) | ORAL | Status: DC | PRN

## 2014-08-06 ENCOUNTER — Ambulatory Visit (INDEPENDENT_AMBULATORY_CARE_PROVIDER_SITE_OTHER): Payer: Medicare Other | Admitting: Nurse Practitioner

## 2014-08-06 ENCOUNTER — Encounter: Payer: Self-pay | Admitting: Nurse Practitioner

## 2014-08-06 VITALS — BP 139/82 | HR 69 | Temp 97.7°F | Ht 65.0 in | Wt 196.2 lb

## 2014-08-06 DIAGNOSIS — M1A00X0 Idiopathic chronic gout, unspecified site, without tophus (tophi): Secondary | ICD-10-CM

## 2014-08-06 DIAGNOSIS — I159 Secondary hypertension, unspecified: Secondary | ICD-10-CM

## 2014-08-06 DIAGNOSIS — Z713 Dietary counseling and surveillance: Secondary | ICD-10-CM

## 2014-08-06 DIAGNOSIS — M1A9XX0 Chronic gout, unspecified, without tophus (tophi): Secondary | ICD-10-CM

## 2014-08-06 DIAGNOSIS — E785 Hyperlipidemia, unspecified: Secondary | ICD-10-CM

## 2014-08-06 DIAGNOSIS — E119 Type 2 diabetes mellitus without complications: Secondary | ICD-10-CM

## 2014-08-06 DIAGNOSIS — I158 Other secondary hypertension: Secondary | ICD-10-CM

## 2014-08-06 DIAGNOSIS — Z13820 Encounter for screening for osteoporosis: Secondary | ICD-10-CM

## 2014-08-06 DIAGNOSIS — Z6832 Body mass index (BMI) 32.0-32.9, adult: Secondary | ICD-10-CM

## 2014-08-06 LAB — POCT GLYCOSYLATED HEMOGLOBIN (HGB A1C): HEMOGLOBIN A1C: 7.4

## 2014-08-06 NOTE — Patient Instructions (Signed)

## 2014-08-06 NOTE — Progress Notes (Signed)
Subjective:    Patient ID: Katelyn Ballard, female    DOB: Mar 10, 1945, 69 y.o.   MRN: [id number]  Patient here today for follow up- no complaints and no changes since last visit.  Hyperlipidemia This is a chronic problem. The current episode started more than 1 year ago. The problem is uncontrolled. Recent lipid tests were reviewed and are high. Exacerbating diseases include obesity. She has no history of diabetes or liver disease. Factors aggravating her hyperlipidemia include fatty foods. Pertinent negatives include no chest pain, focal sensory loss, leg pain, myalgias or shortness of breath. Current antihyperlipidemic treatment includes fibric acid derivatives. The current treatment provides mild improvement of lipids. Risk factors for coronary artery disease include hypertension, obesity, post-menopausal and dyslipidemia.  Hypertension This is a chronic problem. The current episode started more than 1 year ago. The problem has been waxing and waning since onset. The problem is uncontrolled. Pertinent negatives include no anxiety, chest pain, palpitations, peripheral edema or shortness of breath. Risk factors for coronary artery disease include obesity, post-menopausal state, dyslipidemia and family history. Past treatments include ACE inhibitors (patient suppose to beon 2 tablets a day an dhas only been taking one because afraid she would run out of meds.). The current treatment provides mild improvement. There is no history of a thyroid problem. There is no history of sleep apnea.  Diabetes She presents for her follow-up diabetic visit. She has type 2 diabetes mellitus. No MedicAlert identification noted. Her disease course has been stable. There are no hypoglycemic associated symptoms. Pertinent negatives for diabetes include no chest pain, no foot paresthesias, no polydipsia, no polyphagia, no polyuria, no visual change and no weight loss. There are no hypoglycemic complications. Symptoms are  stable. There are no diabetic complications. Risk factors for coronary artery disease include diabetes mellitus, dyslipidemia, family history, hypertension and post-menopausal. Current diabetic treatment includes oral agent (monotherapy). She is compliant with treatment most of the time. Diabetic current diet: not watching diet. She has not had a previous visit with a dietician. She rarely participates in exercise. There is no change in her home blood glucose trend. Her breakfast blood glucose is taken between 8-9 am. Her breakfast blood glucose range is generally 140-180 mg/dl. An ACE inhibitor/angiotensin II receptor blocker is being taken. She does not see a podiatrist.Eye exam is not current.  GOUT Taking allopurinol daily- no recent flareups     Review of Systems  Constitutional: Negative for weight loss.  Respiratory: Negative for shortness of breath.   Cardiovascular: Negative for chest pain and palpitations.  Endocrine: Negative for polydipsia, polyphagia and polyuria.  Genitourinary:       Occasional urinary incontinence - only occurs when sheis not near a restroom when she has to go.  Musculoskeletal: Negative for myalgias.  All other systems reviewed and are negative.      Objective:   Physical Exam  Vitals reviewed. Constitutional: She is oriented to person, place, and time. She appears well-developed and well-nourished.  HENT:  Head: Normocephalic.  Right Ear: External ear normal.  Left Ear: External ear normal.  Mouth/Throat: Oropharynx is clear and moist.  Eyes: Pupils are equal, round, and reactive to light.  Neck: Normal range of motion. Neck supple. No thyromegaly present.  Cardiovascular: Normal rate, regular rhythm, normal heart sounds and intact distal pulses.   Pulmonary/Chest: Effort normal and breath sounds normal.  Abdominal: Soft. Bowel sounds are normal. She exhibits no distension. There is no tenderness.  Musculoskeletal: Normal range of motion. She  exhibits no edema and no tenderness.  Neurological: She is alert and oriented to person, place, and time.  Skin: Skin is warm and dry.  Psychiatric: She has a normal mood and affect. Her behavior is normal. Judgment and thought content normal.     BP 139/82  Pulse 69  Temp(Src) 97.7 F (36.5 C) (Oral)  Ht 5' 5"  (1.651 m)  Wt 196 lb 3.2 oz (88.996 kg)  BMI 32.65 kg/m2    Results for orders placed in visit on 08/06/14  POCT GLYCOSYLATED HEMOGLOBIN (HGB A1C)      Result Value Ref Range   Hemoglobin A1C 7.4         Assessment & Plan:    1. Hyperlipidemia with target LDL less than 100   2. Secondary hypertension, unspecified   3. Type II or unspecified type diabetes mellitus without mention of complication, not stated as uncontrolled   4. Chronic gout without tophus, unspecified cause, unspecified site   5. Screening for osteoporosis   6. BMI 32.0-32.9,adult   7. Weight loss counseling, encounter for     Orders Placed This Encounter  Procedures  . DG Bone Density    Standing Status: Future     Number of Occurrences:      Standing Expiration Date: 10/06/2015    Order Specific Question:  Reason for Exam (SYMPTOM  OR DIAGNOSIS REQUIRED)    Answer:  screening    Order Specific Question:  Preferred imaging location?    Answer:  Internal  . CMP14+EGFR  . NMR, lipoprofile  . POCT glycosylated hemoglobin (Hb A1C)    hemoccult cards given to patient with directions Labs pending Health maintenance reviewed Diet and exercise encouraged- little stricter carb counting Continue all meds Follow up  In 3 months   Harnett, FNP

## 2014-08-07 ENCOUNTER — Other Ambulatory Visit: Payer: Self-pay | Admitting: Nurse Practitioner

## 2014-08-07 LAB — CMP14+EGFR
ALT: 14 IU/L (ref 0–32)
AST: 9 IU/L (ref 0–40)
Albumin/Globulin Ratio: 1.3 (ref 1.1–2.5)
Albumin: 4 g/dL (ref 3.5–4.8)
Alkaline Phosphatase: 63 IU/L (ref 39–117)
BUN/Creatinine Ratio: 23 (ref 11–26)
BUN: 21 mg/dL (ref 8–27)
CALCIUM: 9.6 mg/dL (ref 8.7–10.3)
CHLORIDE: 103 mmol/L (ref 97–108)
CO2: 24 mmol/L (ref 18–29)
Creatinine, Ser: 0.93 mg/dL (ref 0.57–1.00)
GFR calc Af Amer: 72 mL/min/{1.73_m2} (ref >59)
GFR, EST NON AFRICAN AMERICAN: 62 mL/min/{1.73_m2} (ref >59)
Globulin, Total: 3.1 g/dL (ref 1.5–4.5)
Glucose: 142 mg/dL — ABNORMAL HIGH (ref 65–99)
POTASSIUM: 4.2 mmol/L (ref 3.5–5.2)
Sodium: 143 mmol/L (ref 134–144)
Total Bilirubin: 0.3 mg/dL (ref 0.0–1.2)
Total Protein: 7.1 g/dL (ref 6.0–8.5)

## 2014-08-07 LAB — NMR, LIPOPROFILE
Cholesterol: 228 mg/dL — ABNORMAL HIGH (ref 100–199)
HDL Cholesterol by NMR: 33 mg/dL — ABNORMAL LOW (ref >39)
HDL Particle Number: 33.9 umol/L (ref >=30.5)
LDL Particle Number: 2204 nmol/L — ABNORMAL HIGH (ref <1000)
LDL Size: 19.8 nm (ref >20.5)
LDLC SERPL CALC-MCNC: 145 mg/dL — ABNORMAL HIGH (ref 0–99)
LP-IR Score: 77 — ABNORMAL HIGH (ref <=45)
Small LDL Particle Number: 1649 nmol/L — ABNORMAL HIGH (ref <=527)
Triglycerides by NMR: 252 mg/dL — ABNORMAL HIGH (ref 0–149)

## 2014-08-07 MED ORDER — ATORVASTATIN CALCIUM 40 MG PO TABS: 40.0000 mg | ORAL_TABLET | Freq: Every day | ORAL | Status: DC

## 2014-08-10 ENCOUNTER — Telehealth: Payer: Self-pay | Admitting: Family Medicine

## 2014-08-10 NOTE — Telephone Encounter (Signed)
Message copied by Azalee CourseFULP, ASHLEY on Tue Aug 10, 2014 10:06 AM ------      Message from: Bennie PieriniMARTIN, MARY-MARGARET      Created: Sat Aug 07, 2014  8:58 AM       Hgba1c discussed at appointment      Kidney and liver function stable      ldl particle numbers look terrible as well as trig- stay on fenofibrate- needs to be on statin- lipitor rx sent to pharmacy      Strict low fat diet recheck in 3 months ------

## 2014-08-12 ENCOUNTER — Encounter: Payer: Self-pay | Admitting: Family Medicine

## 2014-09-02 ENCOUNTER — Encounter: Payer: Self-pay | Admitting: *Deleted

## 2014-09-21 ENCOUNTER — Telehealth: Payer: Self-pay | Admitting: Family Medicine

## 2014-10-19 ENCOUNTER — Telehealth: Payer: Self-pay | Admitting: Family Medicine

## 2014-10-19 NOTE — Telephone Encounter (Signed)
Pt aware.

## 2014-10-19 NOTE — Telephone Encounter (Signed)
Pt still has refills at [facility].

## 2014-10-27 ENCOUNTER — Ambulatory Visit: Payer: Medicare Other

## 2014-10-27 ENCOUNTER — Other Ambulatory Visit: Payer: Medicare Other

## 2014-11-18 ENCOUNTER — Ambulatory Visit: Payer: Medicare Other | Admitting: Nurse Practitioner

## 2014-11-19 ENCOUNTER — Ambulatory Visit (INDEPENDENT_AMBULATORY_CARE_PROVIDER_SITE_OTHER): Payer: Medicare Other | Admitting: Nurse Practitioner

## 2014-11-19 ENCOUNTER — Encounter: Payer: Self-pay | Admitting: Nurse Practitioner

## 2014-11-19 VITALS — BP 138/85 | HR 68 | Temp 97.4°F | Ht 66.0 in | Wt 193.0 lb

## 2014-11-19 DIAGNOSIS — E559 Vitamin D deficiency, unspecified: Secondary | ICD-10-CM

## 2014-11-19 DIAGNOSIS — Z23 Encounter for immunization: Secondary | ICD-10-CM

## 2014-11-19 DIAGNOSIS — Z13820 Encounter for screening for osteoporosis: Secondary | ICD-10-CM

## 2014-11-19 DIAGNOSIS — E119 Type 2 diabetes mellitus without complications: Secondary | ICD-10-CM

## 2014-11-19 DIAGNOSIS — E785 Hyperlipidemia, unspecified: Secondary | ICD-10-CM

## 2014-11-19 DIAGNOSIS — M1A00X0 Idiopathic chronic gout, unspecified site, without tophus (tophi): Secondary | ICD-10-CM

## 2014-11-19 DIAGNOSIS — I10 Essential (primary) hypertension: Secondary | ICD-10-CM

## 2014-11-19 LAB — POCT GLYCOSYLATED HEMOGLOBIN (HGB A1C)

## 2014-11-19 MED ORDER — AMLODIPINE BESYLATE 2.5 MG PO TABS: 2.5000 mg | ORAL_TABLET | Freq: Every day | ORAL | Status: DC

## 2014-11-19 MED ORDER — FENOFIBRATE 145 MG PO TABS: 145.0000 mg | ORAL_TABLET | Freq: Every day | ORAL | Status: DC

## 2014-11-19 MED ORDER — ATORVASTATIN CALCIUM 40 MG PO TABS: 40.0000 mg | ORAL_TABLET | Freq: Every day | ORAL | Status: DC

## 2014-11-19 MED ORDER — VITAMIN D (ERGOCALCIFEROL) 1.25 MG (50000 UNIT) PO CAPS: ORAL_CAPSULE | ORAL | Status: DC

## 2014-11-19 MED ORDER — METFORMIN HCL 1000 MG PO TABS: 1000.0000 mg | ORAL_TABLET | Freq: Two times a day (BID) | ORAL | Status: DC

## 2014-11-19 MED ORDER — LISINOPRIL 40 MG PO TABS: 40.0000 mg | ORAL_TABLET | Freq: Every day | ORAL | Status: DC

## 2014-11-19 MED ORDER — ALLOPURINOL 300 MG PO TABS: 300.0000 mg | ORAL_TABLET | Freq: Every day | ORAL | Status: DC

## 2014-11-19 NOTE — Patient Instructions (Signed)

## 2014-11-19 NOTE — Progress Notes (Signed)
Subjective:    Patient ID: Katelyn Ballard, female    DOB: 09-05-1945, 69 y.o.   MRN: [id number]  Patient here today for follow up- no complaints and no changes since last visit.  Hyperlipidemia This is a chronic problem. The problem is controlled. Recent lipid tests were reviewed and are normal. Exacerbating diseases include diabetes and obesity. She has no history of hypothyroidism. Pertinent negatives include no chest pain, myalgias or shortness of breath. Current antihyperlipidemic treatment includes statins and fibric acid derivatives. The current treatment provides moderate improvement of lipids. Compliance problems include adherence to diet and adherence to exercise.  Risk factors for coronary artery disease include diabetes mellitus, dyslipidemia, hypertension, obesity and post-menopausal.  Hypertension This is a chronic problem. The current episode started more than 1 year ago. The problem is unchanged. The problem is controlled. Pertinent negatives include no chest pain, palpitations, peripheral edema or shortness of breath. Risk factors for coronary artery disease include diabetes mellitus, dyslipidemia, family history, obesity and post-menopausal state. Past treatments include calcium channel blockers and ACE inhibitors. The current treatment provides moderate improvement. Compliance problems include diet and exercise.   Diabetes She presents for her follow-up diabetic visit. She has type 2 diabetes mellitus. No MedicAlert identification noted. Pertinent negatives for diabetes include no chest pain, no polydipsia, no polyphagia, no polyuria and no visual change. Risk factors for coronary artery disease include diabetes mellitus, dyslipidemia, family history, obesity, post-menopausal and hypertension. Current diabetic treatment includes oral agent (monotherapy). Her weight is stable. When asked about meal planning, she reported none. She never participates in exercise. Her breakfast blood  glucose is taken between 8-9 am. Her breakfast blood glucose range is generally 140-180 mg/dl. Her highest blood glucose is >200 mg/dl. Her overall blood glucose range is 140-180 mg/dl. An ACE inhibitor/angiotensin II receptor blocker is being taken. She does not see a podiatrist.Eye exam is not current.  GOUT Taking allopurinol daily- no recent flareups     Review of Systems  Respiratory: Negative for shortness of breath.   Cardiovascular: Negative for chest pain and palpitations.  Endocrine: Negative for polydipsia, polyphagia and polyuria.  Genitourinary:       Occasional urinary incontinence - only occurs when sheis not near a restroom when she has to go.  Musculoskeletal: Negative for myalgias.  All other systems reviewed and are negative.      Objective:   Physical Exam  Constitutional: She is oriented to person, place, and time. She appears well-developed and well-nourished.  HENT:  Nose: Nose normal.  Mouth/Throat: Oropharynx is clear and moist.  Eyes: EOM are normal.  Neck: Trachea normal, normal range of motion and full passive range of motion without pain. Neck supple. No JVD present. Carotid bruit is not present. No thyromegaly present.  Cardiovascular: Normal rate, regular rhythm, normal heart sounds and intact distal pulses.  Exam reveals no gallop and no friction rub.   No murmur heard. Pulmonary/Chest: Effort normal and breath sounds normal.  Abdominal: Soft. Bowel sounds are normal. She exhibits no distension and no mass. There is no tenderness.  Musculoskeletal: Normal range of motion.  Lymphadenopathy:    She has no cervical adenopathy.  Neurological: She is alert and oriented to person, place, and time. She has normal reflexes.  Skin: Skin is warm and dry.  Psychiatric: She has a normal mood and affect. Her behavior is normal. Judgment and thought content normal.     BP 138/85 mmHg  Pulse 68  Temp(Src) 97.4 F (36.3 C) (Oral)  Ht 5' 6"  (1.676 m)  Wt 193  lb (87.544 kg)  BMI 31.17 kg/m2    Results for orders placed or performed in visit on 11/19/14  POCT glycosylated hemoglobin (Hb A1C)  Result Value Ref Range   Hemoglobin A1C 7.9%        Assessment & Plan:     ICD-9-CM ICD-10-CM   1. Hyperlipidemia with target LDL less than 100 272.4 E78.5 NMR, lipoprofile     fenofibrate (TRICOR) 145 MG tablet     atorvastatin (LIPITOR) 40 MG tablet  2. Essential hypertension 401.9 I10 CMP14+EGFR     amLODipine (NORVASC) 2.5 MG tablet     lisinopril (PRINIVIL,ZESTRIL) 40 MG tablet  3. Type 2 diabetes mellitus without complication 117.35 A70.1 POCT glycosylated hemoglobin (Hb A1C)     metFORMIN (GLUCOPHAGE) 1000 MG tablet  4. Idiopathic chronic gout without tophus, unspecified site 274.02 [id number] allopurinol (ZYLOPRIM) 300 MG tablet  5. Vitamin D deficiency 268.9 E55.9 Vitamin D, Ergocalciferol, (DRISDOL) 50000 UNITS CAPS capsule  6. Screening for osteoporosis V82.81 Z13.820 DG Bone Density  increase metformin today  Low fat diet and exercise Labs pending hemoccult cards given to patient with directions Health maintenance reviewed Follow up in 3 months  Buckner, FNP

## 2014-11-20 LAB — CMP14+EGFR
ALT: 16 IU/L (ref 0–32)
AST: 11 IU/L (ref 0–40)
Albumin/Globulin Ratio: 1.3 (ref 1.1–2.5)
Albumin: 3.9 g/dL (ref 3.5–4.8)
Alkaline Phosphatase: 72 IU/L (ref 39–117)
BUN/Creatinine Ratio: 21 (ref 11–26)
BUN: 17 mg/dL (ref 8–27)
CALCIUM: 9.3 mg/dL (ref 8.7–10.3)
CO2: 24 mmol/L (ref 18–29)
Chloride: 99 mmol/L (ref 97–108)
Creatinine, Ser: 0.81 mg/dL (ref 0.57–1.00)
GFR calc Af Amer: 85 mL/min/{1.73_m2} (ref >59)
GFR calc non Af Amer: 74 mL/min/{1.73_m2} (ref >59)
GLUCOSE: 166 mg/dL — AB (ref 65–99)
Globulin, Total: 3 g/dL (ref 1.5–4.5)
POTASSIUM: 4.1 mmol/L (ref 3.5–5.2)
Sodium: 138 mmol/L (ref 134–144)
Total Bilirubin: 0.3 mg/dL (ref 0.0–1.2)
Total Protein: 6.9 g/dL (ref 6.0–8.5)

## 2014-11-20 LAB — NMR, LIPOPROFILE
Cholesterol: 151 mg/dL (ref 100–199)
HDL CHOLESTEROL BY NMR: 33 mg/dL — AB (ref >39)
HDL Particle Number: 27.8 umol/L — ABNORMAL LOW (ref >=30.5)
LDL Particle Number: 1344 nmol/L — ABNORMAL HIGH (ref <1000)
LDL Size: 20.4 nm (ref >20.5)
LDL-C: 89 mg/dL (ref 0–99)
LP-IR Score: 79 — ABNORMAL HIGH (ref <=45)
Small LDL Particle Number: 868 nmol/L — ABNORMAL HIGH (ref <=527)
Triglycerides by NMR: 147 mg/dL (ref 0–149)

## 2014-11-22 ENCOUNTER — Telehealth: Payer: Self-pay | Admitting: Family Medicine

## 2014-11-22 NOTE — Telephone Encounter (Signed)
-----   Message from Mary-Margaret Martin, FNP sent at 11/20/2014  8:09 AM EST ----- °Hgba1c discussed at appointment °Kidney and liver function stable °Cholesterol is improving °Continue current meds- low fat diet and exercise and recheck in 3 months ° °

## 2014-11-22 NOTE — Telephone Encounter (Signed)
Patient aware.

## 2014-11-23 ENCOUNTER — Telehealth: Payer: Self-pay | Admitting: Nurse Practitioner

## 2014-11-23 NOTE — Telephone Encounter (Signed)
-----   Message from [facility] Martin, FNP sent at 11/20/2014  8:09 AM EST ----- Hgba1c discussed at appointment Kidney and liver function stable Cholesterol is improving Continue current meds- low fat diet and exercise and recheck in 3 months

## 2014-11-23 NOTE — Telephone Encounter (Signed)
-----   Message from Mary-Margaret Martin, FNP sent at 11/20/2014  8:09 AM EST ----- °Hgba1c discussed at appointment °Kidney and liver function stable °Cholesterol is improving °Continue current meds- low fat diet and exercise and recheck in 3 months ° °

## 2014-11-26 LAB — HM DIABETES EYE EXAM

## 2014-12-22 ENCOUNTER — Other Ambulatory Visit: Payer: Self-pay | Admitting: Family Medicine

## 2015-01-25 ENCOUNTER — Ambulatory Visit (INDEPENDENT_AMBULATORY_CARE_PROVIDER_SITE_OTHER): Payer: Medicare Other

## 2015-01-25 ENCOUNTER — Ambulatory Visit (INDEPENDENT_AMBULATORY_CARE_PROVIDER_SITE_OTHER): Payer: Medicare Other | Admitting: Family

## 2015-01-25 ENCOUNTER — Encounter: Payer: Self-pay | Admitting: Family

## 2015-01-25 VITALS — BP 145/80 | HR 71 | Temp 97.3°F | Ht 66.0 in | Wt 191.8 lb

## 2015-01-25 DIAGNOSIS — M25511 Pain in right shoulder: Secondary | ICD-10-CM | POA: Diagnosis not present

## 2015-01-25 DIAGNOSIS — R200 Anesthesia of skin: Secondary | ICD-10-CM

## 2015-01-25 DIAGNOSIS — M5412 Radiculopathy, cervical region: Secondary | ICD-10-CM

## 2015-01-25 MED ORDER — KETOROLAC TROMETHAMINE 30 MG/ML IJ SOLN
30.0000 mg | Freq: Once | INTRAMUSCULAR | Status: AC
  Administered 2015-01-25: 30 mg via INTRAMUSCULAR

## 2015-01-25 MED ORDER — MELOXICAM 7.5 MG PO TABS
7.5000 mg | ORAL_TABLET | Freq: Every day | ORAL | Status: DC
Start: 2015-01-25 — End: 2015-03-23

## 2015-01-25 NOTE — Patient Instructions (Signed)

## 2015-01-25 NOTE — Progress Notes (Signed)
   Subjective:    Patient ID: Katelyn Katelyn, female    DOB: 30-Mar-1945, 70 y.o.   MRN: [id number]  Neck Pain  Associated symptoms include numbness and tingling. Pertinent negatives include no headaches.  Shoulder Pain  The pain is present in the right shoulder and neck. This is a chronic problem. The current episode started 1 to 4 weeks ago. There has been no history of extremity trauma. The problem occurs intermittently. The problem has been waxing and waning. The quality of the pain is described as aching. The pain is at a severity of 7/10. The pain is moderate. Associated symptoms include numbness and tingling. Pertinent negatives include no inability to bear weight, itching, joint swelling or stiffness. The symptoms are aggravated by lying down. She has tried NSAIDS for the symptoms. The treatment provided mild relief. Her past medical history is significant for diabetes, gout and osteoarthritis.      Review of Systems  Constitutional: Negative.   HENT: Negative.   Eyes: Negative.   Respiratory: Negative.  Negative for shortness of breath.   Cardiovascular: Negative.  Negative for palpitations.  Gastrointestinal: Negative.   Endocrine: Negative.   Genitourinary: Negative.   Musculoskeletal: Positive for gout and neck pain. Negative for stiffness.  Skin: Negative for itching.  Neurological: Positive for tingling and numbness. Negative for headaches.  Hematological: Negative.   Psychiatric/Behavioral: Negative.   All other systems reviewed and are negative.      Objective:   Physical Exam  Constitutional: She is oriented to person, place, and time. She appears well-developed and well-nourished. No distress.  HENT:  Head: Normocephalic and atraumatic.  Right Ear: External ear normal.  Left Ear: External ear normal.  Nose: Nose normal.  Mouth/Throat: Oropharynx is clear and moist.  Eyes: Pupils are equal, round, and reactive to light.  Neck: Normal range of motion. Neck  supple. No thyromegaly present.  Cardiovascular: Normal rate, regular rhythm, normal heart sounds and intact distal pulses.   No murmur heard. Pulmonary/Chest: Effort normal and breath sounds normal. No respiratory distress. She has no wheezes.  Abdominal: Soft. Bowel sounds are normal. She exhibits no distension. There is no tenderness.  Musculoskeletal: Normal range of motion. She exhibits no edema or tenderness.  Neurological: She is alert and oriented to person, place, and time. She has normal reflexes. No cranial nerve deficit.  Skin: Skin is warm and dry.  Psychiatric: She has a normal mood and affect. Her behavior is normal. Judgment and thought content normal.  Vitals reviewed.    BP 145/80 mmHg  Pulse 71  Temp(Src) 97.3 F (36.3 C) (Oral)  Ht 5\' 6"  (1.676 m)  Wt 191 lb 12.8 oz (87 kg)  BMI 30.97 kg/m2  X-ray- neck show arthritis changes,  Shoulder- WNL Preliminary reading by Jannifer Rodneyhristy Harshita Bernales, FNP [facility]      Assessment & Plan:  1. Right shoulder pain - DG Shoulder Right; Future - DG Cervical Spine Complete; Future  2. Numbness of fingers - DG Shoulder Right; Future - DG Cervical Spine Complete; Future  3. Cervical radiculitis -Rest -Ice -No other NSAID's while taking Mobic- Don't start Mobic until tomorrow - ketorolac (TORADOL) 30 MG/ML injection 30 mg; Inject 1 mL (30 mg total) into the muscle once. - meloxicam (MOBIC) 7.5 MG tablet; Take 1 tablet (7.5 mg total) by mouth daily.  Dispense: 30 tablet; Refill: 0  Jannifer Rodneyhristy Navaya Wiatrek, FNP

## 2015-03-02 ENCOUNTER — Encounter: Payer: Self-pay | Admitting: Nurse Practitioner

## 2015-03-02 ENCOUNTER — Ambulatory Visit (INDEPENDENT_AMBULATORY_CARE_PROVIDER_SITE_OTHER): Payer: Medicare Other | Admitting: Nurse Practitioner

## 2015-03-02 VITALS — BP 144/83 | HR 68 | Temp 97.0°F | Ht 66.0 in | Wt 197.0 lb

## 2015-03-02 DIAGNOSIS — M1A9XX0 Chronic gout, unspecified, without tophus (tophi): Secondary | ICD-10-CM

## 2015-03-02 DIAGNOSIS — Z13820 Encounter for screening for osteoporosis: Secondary | ICD-10-CM

## 2015-03-02 DIAGNOSIS — I10 Essential (primary) hypertension: Secondary | ICD-10-CM | POA: Diagnosis not present

## 2015-03-02 DIAGNOSIS — E119 Type 2 diabetes mellitus without complications: Secondary | ICD-10-CM | POA: Diagnosis not present

## 2015-03-02 DIAGNOSIS — E785 Hyperlipidemia, unspecified: Secondary | ICD-10-CM | POA: Diagnosis not present

## 2015-03-02 LAB — POCT UA - MICROALBUMIN: Microalbumin Ur, POC: 20 mg/L

## 2015-03-02 LAB — POCT GLYCOSYLATED HEMOGLOBIN (HGB A1C): HEMOGLOBIN A1C: 7.7

## 2015-03-02 MED ORDER — GLIMEPIRIDE 2 MG PO TABS
2.0000 mg | ORAL_TABLET | Freq: Every day | ORAL | Status: DC
Start: 2015-03-02 — End: 2015-06-27

## 2015-03-02 NOTE — Patient Instructions (Signed)
Exercise to Stay Healthy Exercise helps you become and stay healthy. EXERCISE IDEAS AND TIPS Choose exercises that:  You enjoy.  Fit into your day. You do not need to exercise really hard to be healthy. You can do exercises at a slow or medium level and stay healthy. You can:  Stretch before and after working out.  Try yoga, Pilates, or tai chi.  Lift weights.  Walk fast, swim, jog, run, climb stairs, bicycle, dance, or rollerskate.  Take aerobic classes. Exercises that burn about 150 calories:  Running 1  miles in 15 minutes.  Playing volleyball for 45 to 60 minutes.  Washing and waxing a car for 45 to 60 minutes.  Playing touch football for 45 minutes.  Walking 1  miles in 35 minutes.  Pushing a stroller 1  miles in 30 minutes.  Playing basketball for 30 minutes.  Raking leaves for 30 minutes.  Bicycling 5 miles in 30 minutes.  Walking 2 miles in 30 minutes.  Dancing for 30 minutes.  Shoveling snow for 15 minutes.  Swimming laps for 20 minutes.  Walking up stairs for 15 minutes.  Bicycling 4 miles in 15 minutes.  Gardening for 30 to 45 minutes.  Jumping rope for 15 minutes.  Washing windows or floors for 45 to 60 minutes. Document Released: 01/19/2011 Document Revised: 03/10/2012 Document Reviewed: 01/19/2011 [employer] Patient Information 2015 [employer]. This information is not intended to replace advice given to you by your health care provider. Make sure you discuss any questions you have with your health care provider.  

## 2015-03-02 NOTE — Progress Notes (Signed)
Subjective:    Patient ID: Katelyn Ballard, female    DOB: 11/23/1945, 70 y.o.   MRN: [id number]  Patient here today for follow up- no complaints and no changes since last visit.  Hyperlipidemia This is a chronic problem. The problem is controlled. Recent lipid tests were reviewed and are normal. Exacerbating diseases include diabetes and obesity. She has no history of hypothyroidism. Pertinent negatives include no chest pain, myalgias or shortness of breath. Current antihyperlipidemic treatment includes statins and fibric acid derivatives. The current treatment provides moderate improvement of lipids. Compliance problems include adherence to diet and adherence to exercise.  Risk factors for coronary artery disease include diabetes mellitus, dyslipidemia, hypertension, obesity and post-menopausal.  Hypertension This is a chronic problem. The current episode started more than 1 year ago. The problem is unchanged. The problem is controlled. Pertinent negatives include no chest pain, palpitations, peripheral edema or shortness of breath. Risk factors for coronary artery disease include diabetes mellitus, dyslipidemia, family history, obesity and post-menopausal state. Past treatments include calcium channel blockers and ACE inhibitors. The current treatment provides moderate improvement. Compliance problems include diet and exercise.   Diabetes She presents for her follow-up diabetic visit. She has type 2 diabetes mellitus. No MedicAlert identification noted. Pertinent negatives for diabetes include no chest pain, no polydipsia, no polyphagia, no polyuria and no visual change. Risk factors for coronary artery disease include diabetes mellitus, dyslipidemia, family history, obesity, post-menopausal and hypertension. Current diabetic treatment includes oral agent (monotherapy). Her weight is stable. When asked about meal planning, she reported none. She never participates in exercise. Her breakfast blood  glucose is taken between 8-9 am. Her breakfast blood glucose range is generally 140-180 mg/dl. Her highest blood glucose is >200 mg/dl. Her overall blood glucose range is 140-180 mg/dl. An ACE inhibitor/angiotensin II receptor blocker is being taken. She does not see a podiatrist.Eye exam is not current.  GOUT Taking allopurinol daily- no recent flareups     Review of Systems  Constitutional: Negative.   HENT: Negative.   Respiratory: Negative for shortness of breath.   Cardiovascular: Negative for chest pain and palpitations.  Endocrine: Negative for polydipsia, polyphagia and polyuria.  Genitourinary:       Occasional urinary incontinence - only occurs when sheis not near a restroom when she has to go.  Musculoskeletal: Negative for myalgias.  All other systems reviewed and are negative.      Objective:   Physical Exam  Constitutional: She is oriented to person, place, and time. She appears well-developed and well-nourished.  HENT:  Nose: Nose normal.  Mouth/Throat: Oropharynx is clear and moist.  Eyes: EOM are normal.  Neck: Trachea normal, normal range of motion and full passive range of motion without pain. Neck supple. No JVD present. Carotid bruit is not present. No thyromegaly present.  Cardiovascular: Normal rate, regular rhythm, normal heart sounds and intact distal pulses.  Exam reveals no gallop and no friction rub.   No murmur heard. Pulmonary/Chest: Effort normal and breath sounds normal.  Abdominal: Soft. Bowel sounds are normal. She exhibits no distension and no mass. There is no tenderness.  Musculoskeletal: Normal range of motion.  Lymphadenopathy:    She has no cervical adenopathy.  Neurological: She is alert and oriented to person, place, and time. She has normal reflexes.  Skin: Skin is warm and dry.  Psychiatric: She has a normal mood and affect. Her behavior is normal. Judgment and thought content normal.    BP 144/83 mmHg  Pulse 68  Temp(Src) 97 F  (36.1 C) (Oral)  Ht 5' 6" (1.676 m)  Wt 197 lb (89.359 kg)  BMI 31.81 kg/m2      Results for orders placed or performed in visit on 03/02/15  POCT glycosylated hemoglobin (Hb A1C)  Result Value Ref Range   Hemoglobin A1C 7.7   POCT UA - Microalbumin  Result Value Ref Range   Microalbumin Ur, POC 20 mg/L   Creatinine, POC  mg/dL   Albumin/Creatinine Ratio, Urine, POC         Assessment & Plan:   1. Hyperlipidemia with target LDL less than 100 Low fat diet - NMR, lipoprofile  2. Essential hypertension Do not add slat to diet - CMP14+EGFR  3. Type 2 diabetes mellitus without complication Added amaryl today - POCT glycosylated hemoglobin (Hb A1C) - POCT UA - Microalbumin - Microalbumin, urine - glimepiride (AMARYL) 2 MG tablet; Take 1 tablet (2 mg total) by mouth daily before breakfast.  Dispense: 90 tablet; Refill: 1  4. Chronic gout without tophus, unspecified cause, unspecified site   Ordered dexascan Labs pending Health maintenance reviewed Diet and exercise encouraged Continue all meds Follow up  In 3 months   [place], FNP

## 2015-03-03 LAB — NMR, LIPOPROFILE
CHOLESTEROL: 127 mg/dL (ref 100–199)
HDL CHOLESTEROL BY NMR: 36 mg/dL — AB (ref >39)
HDL Particle Number: 30.4 umol/L — ABNORMAL LOW (ref >=30.5)
LDL Particle Number: 1133 nmol/L — ABNORMAL HIGH (ref <1000)
LDL SIZE: 19.8 nm (ref >20.5)
LDL-C: 65 mg/dL (ref 0–99)
LP-IR SCORE: 81 — AB (ref <=45)
Small LDL Particle Number: 859 nmol/L — ABNORMAL HIGH (ref <=527)
TRIGLYCERIDES BY NMR: 129 mg/dL (ref 0–149)

## 2015-03-03 LAB — MICROALBUMIN, URINE: Microalbumin, Urine: 8.7 ug/mL (ref 0.0–17.0)

## 2015-03-03 LAB — CMP14+EGFR
ALK PHOS: 69 IU/L (ref 39–117)
ALT: 12 IU/L (ref 0–32)
AST: 7 IU/L (ref 0–40)
Albumin/Globulin Ratio: 1.3 (ref 1.1–2.5)
Albumin: 3.9 g/dL (ref 3.5–4.8)
BILIRUBIN TOTAL: 0.3 mg/dL (ref 0.0–1.2)
BUN/Creatinine Ratio: 31 — ABNORMAL HIGH (ref 11–26)
BUN: 23 mg/dL (ref 8–27)
CHLORIDE: 104 mmol/L (ref 97–108)
CO2: 24 mmol/L (ref 18–29)
Calcium: 9.4 mg/dL (ref 8.7–10.3)
Creatinine, Ser: 0.74 mg/dL (ref 0.57–1.00)
GFR calc non Af Amer: 82 mL/min/{1.73_m2} (ref >59)
GFR, EST AFRICAN AMERICAN: 95 mL/min/{1.73_m2} (ref >59)
GLUCOSE: 151 mg/dL — AB (ref 65–99)
Globulin, Total: 3.1 g/dL (ref 1.5–4.5)
POTASSIUM: 4.2 mmol/L (ref 3.5–5.2)
SODIUM: 143 mmol/L (ref 134–144)
TOTAL PROTEIN: 7 g/dL (ref 6.0–8.5)

## 2015-03-04 ENCOUNTER — Telehealth: Payer: Self-pay | Admitting: Nurse Practitioner

## 2015-03-04 ENCOUNTER — Encounter: Payer: Self-pay | Admitting: Nurse Practitioner

## 2015-03-04 NOTE — Telephone Encounter (Signed)
-----   Message from [facility] IncMary-Margaret Martin, FNP sent at 03/03/2015  4:19 PM EST ----- microalbumin negative Hgba1c discussed at appointment Kidney and liver function stable Cholesterol looks ok Continue current meds- low fat diet and exercise and recheck in 3 months

## 2015-03-11 NOTE — Telephone Encounter (Signed)
Patient aware.

## 2015-03-23 ENCOUNTER — Ambulatory Visit (INDEPENDENT_AMBULATORY_CARE_PROVIDER_SITE_OTHER): Payer: Medicare Other | Admitting: Pharmacist

## 2015-03-23 ENCOUNTER — Ambulatory Visit (INDEPENDENT_AMBULATORY_CARE_PROVIDER_SITE_OTHER): Payer: Medicare Other

## 2015-03-23 VITALS — Ht 64.0 in | Wt 195.0 lb

## 2015-03-23 DIAGNOSIS — Z780 Asymptomatic menopausal state: Secondary | ICD-10-CM

## 2015-03-23 DIAGNOSIS — Z79899 Other long term (current) drug therapy: Secondary | ICD-10-CM | POA: Diagnosis not present

## 2015-03-23 DIAGNOSIS — M8580 Other specified disorders of bone density and structure, unspecified site: Secondary | ICD-10-CM | POA: Diagnosis not present

## 2015-03-23 DIAGNOSIS — Z13820 Encounter for screening for osteoporosis: Secondary | ICD-10-CM

## 2015-03-23 MED ORDER — CALCIUM CARBONATE-VITAMIN D 500-200 MG-UNIT PO TABS: 1.0000 | ORAL_TABLET | Freq: Two times a day (BID) | ORAL | Status: DC

## 2015-03-23 NOTE — Progress Notes (Signed)
Patient ID: Ballard Russellamela C Jarnigan, female   DOB: 1945-04-13, 70 y.o.   MRN: [id number]  [facility] Current Height: Height: 5\' 4"  (162.6 cm)      Max Lifetime Height:  5'  6" Current Weight: Weight: 195 lb (88.451 kg)       Ethnicity:Caucasian   HPI: Does pt already have a diagnosis of:  Osteopenia?  No Osteoporosis?  No  Back Pain?  No       Kyphosis?  Yes Prior fracture?  No Med(s) for Osteoporosis/Osteopenia:  none Med(s) previously tried for Osteoporosis/Osteopenia:  none                                                             PMH: Age at menopause:  About 70yo Hysterectomy?  Yes Oophorectomy?  Removed 1 ovary HRT? Yes - Former.  Type/duration: for 1 year - estorgen patch Steroid Use?  No Thyroid med?  No History of cancer?  No History of digestive disorders (ie Crohn's)?  No Current or previous eating disorders?  No Last Vitamin D Result:  14.8 (10/22/2013) Last GFR Result:  82 (03/02/2015)   FH/SH: Family history of osteoporosis?  No Parent with history of hip fracture?  No Family history of breast cancer?  No Exercise?  No - patient reports hurting in leg and back since starting atorvastatin 40mg  about 3 months ago.  She is also taking fenofibrate daily. Smoking?  No Alcohol?  No    Calcium Assessment Calcium Intake  # of servings/day  Calcium mg  Milk (8 oz) 0  x  300  = 0  Yogurt (4 oz) 0 x  200 = 0  Cheese (1 oz) 1 x  200 = 200mg   Other Calcium sources   250mg   Ca supplement 0 = 0   Estimated calcium intake per day 450mg     DEXA Results Date of Test T-Score for AP Spine L1-L4 T-Score for Total Left Hip T-Score for Total Right Hip  03/23/2015 -0.3 -0.6 -0.5                   FRAX 10 year estimate: Total FX risk:  9.6%  (consider medication if >/= 20%) Hip FX risk:  1.4%  (consider medication if >/= 3%)  Assessment: Osteopenia  Leg / muscle pain - possibly related to statin and/or statin + fenofibrate combination  Recommendations: 1.    Discussed BMD  / DEXA results and discussed fracture risk. 2.  recommend calcium 1200mg  daily through supplementation or diet.  3.  recommend weight bearing exercise - 30 minutes at least 4 days per week as able 4.  Counseled and educated about fall risk and prevention. 5.  Patient instructed to hold fenofibrate to see pain improves.  If no change then will consider holding statin.   6.  RTC in 2 weeks to reassess medications and do AWV  Recheck DEXA:  2 years  Time spent counseling patient:  35 minutes   Henrene Pastorammy Courtni Balash, PharmD, CPP

## 2015-03-23 NOTE — Patient Instructions (Signed)
Hold fenofibrate for next 2 weeks - if pain is getter better / improving then continue to not take fenofibrate.  If pain does not improve call me and we will discuss stopping or holding atorvastatin / Lipitor or other alternatives.  Cherre Robins [phone]               Exercise for Strong Bones  Exercise is important to build and maintain strong bones / bone density.  There are 2 types of exercises that are important to building and maintaining strong bones:  Weight- bearing and muscle-stregthening.  Weight-bearing Exercises  These exercises include activities that make you move against gravity while staying upright. Weight-bearing exercises can be high-impact or low-impact.  High-impact weight-bearing exercises help build bones and keep them strong. If you have broken a bone due to osteoporosis or are at risk of breaking a bone, you may need to avoid high-impact exercises. If you're not sure, you should check with your healthcare provider.  Examples of high-impact weight-bearing exercises are: Dancing  Doing high-impact aerobics  Hiking  Jogging/running  Jumping Rope  Stair climbing  Tennis  Low-impact weight-bearing exercises can also help keep bones strong and are a safe alternative if you cannot do high-impact exercises.   Examples of low-impact weight-bearing exercises are: Using elliptical training machines  Doing low-impact aerobics  Using stair-step machines  Fast walking on a treadmill or outside   Muscle-Strengthening Exercises These exercises include activities where you move your body, a weight or some other resistance against gravity. They are also known as resistance exercises and include: Lifting weights  Using elastic exercise bands  Using weight machines  Lifting your own body weight  Functional movements, such as standing and rising up on your toes  Yoga and Pilates can also improve strength, balance and flexibility. However, certain positions may not be  safe for people with osteoporosis or those at increased risk of broken bones. For example, exercises that have you bend forward may increase the chance of breaking a bone in the spine.   Non-Impact Exercises There are other types of exercises that can help prevent falls.  Non-impact exercises can help you to improve balance, posture and how well you move in everyday activities. Some of these exercises include: Balance exercises that strengthen your legs and test your balance, such as Tai Chi, can decrease your risk of falls.  Posture exercises that improve your posture and reduce rounded or "sloping" shoulders can help you decrease the chance of breaking a bone, especially in the spine.  Functional exercises that improve how well you move can help you with everyday activities and decrease your chance of falling and breaking a bone. For example, if you have trouble getting up from a chair or climbing stairs, you should do these activities as exercises.   **A physical therapist can teach you balance, posture and functional exercises. He/she can also help you learn which exercises are safe and appropriate for you.  [facility] has a physical therapy office in [place] in front of our office and referrals can be made for assessments and treatment as needed and strength and balance training.  If you would like to have an assessment with [facility] and our physical therapy team please let a nurse or provider know.   Fall Prevention and Home Safety Falls cause injuries and can affect all age groups. It is possible to use preventive measures to significantly decrease the likelihood of falls. There are many simple measures which can make your  home safer and prevent falls. OUTDOORS  Repair cracks and edges of walkways and driveways.  Remove high doorway thresholds.  Trim shrubbery on the main path into your home.  Have good outside lighting.  Clear walkways of tools, rocks, debris, and clutter.  Check that  handrails are not broken and are securely fastened. Both sides of steps should have handrails.  Have leaves, snow, and ice cleared regularly.  Use sand or salt on walkways during winter months.  In the garage, clean up grease or oil spills. BATHROOM  Install night lights.  Install grab bars by the toilet and in the tub and shower.  Use non-skid mats or decals in the tub or shower.  Place a plastic non-slip stool in the shower to sit on, if needed.  Keep floors dry and clean up all water on the floor immediately.  Remove soap buildup in the tub or shower on a regular basis.  Secure bath mats with non-slip, double-sided rug tape.  Remove throw rugs and tripping hazards from the floors. BEDROOMS  Install night lights.  Make sure a bedside light is easy to reach.  Do not use oversized bedding.  Keep a telephone by your bedside.  Have a firm chair with side arms to use for getting dressed.  Remove throw rugs and tripping hazards from the floor. KITCHEN  Keep handles on pots and pans turned toward the center of the stove. Use back burners when possible.  Clean up spills quickly and allow time for drying.  Avoid walking on wet floors.  Avoid hot utensils and knives.  Position shelves so they are not too high or low.  Place commonly used objects within easy reach.  If necessary, use a sturdy step stool with a grab bar when reaching.  Keep electrical cables out of the way.  Do not use floor polish or wax that makes floors slippery. If you must use wax, use non-skid floor wax.  Remove throw rugs and tripping hazards from the floor. STAIRWAYS  Never leave objects on stairs.  Place handrails on both sides of stairways and use them. Fix any loose handrails. Make sure handrails on both sides of the stairways are as long as the stairs.  Check carpeting to make sure it is firmly attached along stairs. Make repairs to worn or loose carpet promptly.  Avoid placing  throw rugs at the top or bottom of stairways, or properly secure the rug with carpet tape to prevent slippage. Get rid of throw rugs, if possible.  Have an electrician put in a light switch at the top and bottom of the stairs. OTHER FALL PREVENTION TIPS  Wear low-heel or rubber-soled shoes that are supportive and fit well. Wear closed toe shoes.  When using a stepladder, make sure it is fully opened and both spreaders are firmly locked. Do not climb a closed stepladder.  Add color or contrast paint or tape to grab bars and handrails in your home. Place contrasting color strips on first and last steps.  Learn and use mobility aids as needed. Install an electrical emergency response system.  Turn on lights to avoid dark areas. Replace light bulbs that burn out immediately. Get light switches that glow.  Arrange furniture to create clear pathways. Keep furniture in the same place.  Firmly attach carpet with non-skid or double-sided tape.  Eliminate uneven floor surfaces.  Select a carpet pattern that does not visually hide the edge of steps.  Be aware of all pets. OTHER HOME  SAFETY TIPS  Set the water temperature for 120 F (48.8 C).  Keep emergency numbers on or near the telephone.  Keep smoke detectors on every level of the home and near sleeping areas. Document Released: 12/07/2002 Document Revised: 06/17/2012 Document Reviewed: 03/07/2012 [facility] Patient Information 2015 [place], [place]. This information is not intended to replace advice given to you by your health care provider. Make sure you discuss any questions you have with your health care provider.

## 2015-04-05 ENCOUNTER — Other Ambulatory Visit: Payer: Self-pay | Admitting: Nurse Practitioner

## 2015-04-08 ENCOUNTER — Ambulatory Visit (INDEPENDENT_AMBULATORY_CARE_PROVIDER_SITE_OTHER): Payer: Medicare Other | Admitting: Pharmacist

## 2015-04-08 ENCOUNTER — Encounter: Payer: Self-pay | Admitting: Pharmacist

## 2015-04-08 VITALS — BP 136/82 | HR 72 | Ht 63.75 in | Wt 193.0 lb

## 2015-04-08 DIAGNOSIS — M1A9XX0 Chronic gout, unspecified, without tophus (tophi): Secondary | ICD-10-CM | POA: Diagnosis not present

## 2015-04-08 DIAGNOSIS — Z1211 Encounter for screening for malignant neoplasm of colon: Secondary | ICD-10-CM

## 2015-04-08 DIAGNOSIS — M791 Myalgia, unspecified site: Secondary | ICD-10-CM

## 2015-04-08 DIAGNOSIS — R7989 Other specified abnormal findings of blood chemistry: Secondary | ICD-10-CM

## 2015-04-08 DIAGNOSIS — E559 Vitamin D deficiency, unspecified: Secondary | ICD-10-CM | POA: Insufficient documentation

## 2015-04-08 DIAGNOSIS — Z0000 Encounter for general adult medical examination without abnormal findings: Secondary | ICD-10-CM | POA: Diagnosis not present

## 2015-04-08 MED ORDER — MIRABEGRON ER 25 MG PO TB24: 25.0000 mg | ORAL_TABLET | Freq: Every day | ORAL | Status: DC

## 2015-04-08 MED ORDER — METFORMIN HCL ER 500 MG PO TB24: 1000.0000 mg | ORAL_TABLET | Freq: Two times a day (BID) | ORAL | Status: DC

## 2015-04-08 NOTE — Progress Notes (Signed)
Patient ID: Katelyn Katelyn, female   DOB: 06-07-1945, 70 y.o.   MRN: [id number]    Subjective:   Katelyn Katelyn is a 70 y.o. female who presents for an Initial Medicare Annual Wellness Visit.  Patient stopped fenofibrate but did not see much a a difference in her muscle pain.  So 3 days ago she also stopped atorvastatin.  Feels that she is see some improvement.   Current Medications (verified) Outpatient Encounter Prescriptions as of 04/08/2015  Medication Sig  . allopurinol (ZYLOPRIM) 300 MG tablet Take 1 tablet (300 mg total) by mouth daily.  . calcium-vitamin D (OSCAL WITH D) 500-200 MG-UNIT per tablet Take 1 tablet by mouth 2 (two) times daily.  Marland Kitchen. glimepiride (AMARYL) 2 MG tablet Take 1 tablet (2 mg total) by mouth daily before breakfast.  . lisinopril (PRINIVIL,ZESTRIL) 40 MG tablet Take 1 tablet (40 mg total) by mouth daily.  . metFORMIN (GLUCOPHAGE) 1000 MG tablet Take 1 tablet (1,000 mg total) by mouth 2 (two) times daily with a meal.  . ONE TOUCH ULTRA TEST test strip USE ONE STRIP TO CHECK GLUCOSE TWICE DAILY TO THREE TIMES DAILY  . Vitamin D, Ergocalciferol, (DRISDOL) 50000 UNITS CAPS capsule TAKE ONE CAPSULE BY MOUTH ONCE A WEEK  . amLODipine (NORVASC) 2.5 MG tablet Take 1 tablet (2.5 mg total) by mouth daily.  . fenofibrate (TRICOR) 145 MG tablet Take 1 tablet (145 mg total) by mouth daily. (Patient not taking: Reported on 04/08/2015)  . ibuprofen (ADVIL,MOTRIN) 600 MG tablet Take 1 tablet (600 mg total) by mouth every 6 (six) hours as needed. (Patient not taking: Reported on 04/08/2015)  . [DISCONTINUED] atorvastatin (LIPITOR) 40 MG tablet Take 1 tablet (40 mg total) by mouth daily. (Patient not taking: Reported on 04/08/2015)    Allergies (verified) Penicillins   History: Past Medical History  Diagnosis Date  . Hyperlipidemia   . Hypertension   . Diabetes mellitus without complication   . Osteopenia   . Ankle fracture, right     1990's  . Gout 2006   Past Surgical  History  Procedure Laterality Date  . Abdominal hysterectomy     Family History  Problem Relation Age of Onset  . Diabetes Mother   . Heart disease Mother   . Kidney disease Mother     blockage of renal artery  . Dementia Mother   . Heart disease Father   . Heart disease Sister   . Diabetes Sister   . Diabetes Sister    Social History   Occupational History  . Not on file.   Social History Main Topics  . Smoking status: Former Smoker    Quit date: 12/31/1992  . Smokeless tobacco: Never Used  . Alcohol Use: No  . Drug Use: No  . Sexual Activity: No    Do you feel safe at home?  Yes  Dietary issues and exercise activities: Current Exercise Habits:: The patient does not participate in regular exercise at present  Current Dietary habits:  Has been limiting breads and potatoes.  Decreased sweet.   Increasing vegetables some.     Objective:    Today's Vitals   04/08/15 0841  BP: 136/82  Pulse: 72  Height: 5' 3.75" (1.619 m)  Weight: 193 lb (87.544 kg)   Body mass index is 33.4 kg/(m^2).  Activities of Daily Living In your present state of health, do you have any difficulty performing the following activities: 04/08/2015 03/02/2015  Is the patient deaf or have difficulty  hearing? N N  Hearing N N  Vision N N  Difficulty concentrating or making decisions N N  Walking or climbing stairs? N N  Doing errands, shopping? N N  Preparing Food and eating ? N -  Using the Toilet? N -  In the past six months, have you accidently leaked urine? Y -  Do you have problems with loss of bowel control? Y -  Managing your Medications? N -  Managing your Finances? N -  Housekeeping or managing your Housekeeping? N -   Patient reports leaking bladder especially at night. Also has frequent loose stools that she believes is related to metformin  bid.   Are there smokers in your home (other than you)? No   Cardiac Risk Factors include: advanced age (>33men, >72  women);diabetes mellitus;dyslipidemia;family history of premature cardiovascular disease;hypertension;obesity (BMI >30kg/m2);sedentary lifestyle  Depression Screen PHQ 2/9 Scores 04/08/2015 03/02/2015 01/25/2015 11/19/2014  PHQ - 2 Score 0 0 0 0    Fall Risk Fall Risk  04/08/2015 03/02/2015 01/25/2015 11/19/2014 08/06/2014  Falls in the past year? No No No No No    Cognitive Function: MMSE - Mini Mental State Exam 04/08/2015  Orientation to time 5  Orientation to Place 5  Registration 3  Attention/ Calculation 5  Recall 3  Language- name 2 objects 2  Language- repeat 1  Language- follow 3 step command 3  Language- read & follow direction 1  Write a sentence 1  Copy design 1  Total score 30    Immunizations and Health Maintenance Immunization History  Administered Date(s) Administered  . Pneumococcal Conjugate-13 11/19/2014   There are no preventive care reminders to display for this patient.  Patient Care Team: Bennie Pierini, FNP as PCP - General (Nurse Practitioner)  Indicate any recent Medical Services you may have received from other than [facility] providers in the past year (date may be approximate).    Assessment:    Annual Wellness Visit  Decreased bladder control Low vitamin D - last checked 2014 Gout - need UA check Type 2 DM - controlled Myalgias - related to statin   Screening Tests Health Maintenance  Topic Date Due  . ZOSTAVAX  05/20/2015 (Originally 07/25/2004)  . COLON CANCER SCREENING ANNUAL FOBT  06/02/2015 (Originally 07/25/1994)  . TETANUS/TDAP  06/02/2015 (Originally 07/26/1963)  . COLONOSCOPY  11/20/2015 (Originally 07/25/1994)  . MAMMOGRAM  03/01/2016 (Originally 01/16/2015)  . INFLUENZA VACCINE  08/01/2015  . HEMOGLOBIN A1C  09/02/2015  . PNA vac Low Risk Adult (2 of 2 - PPSV23) 11/20/2015  . OPHTHALMOLOGY EXAM  11/29/2015  . FOOT EXAM  03/01/2016  . URINE MICROALBUMIN  03/01/2016  . DEXA SCAN  03/22/2017        Plan:   During the course of  the visit Katelyn Ballard was educated and counseled about the following appropriate screening and preventive services:   Vaccines to include Pneumoccal, Influenza, Hepatitis B, Td, Zostavax - no Tdap or Zostavax today due to cost  Colorectal cancer screening - refused colonoscopy but given FOBT cards today in office (zostavax cost was $203 and Boostix was $61)  Cardiovascular disease screening  Diabetes screening  Bone Denisty / Osteoporosis Screening - UTD  Mammogram - referral made to [facility] today  Glaucoma screening / Diabetic Eye Exam- requested notes from last appt with Dr Conley Rolls 11/28/2014  Nutrition counseling - .  Increase non-starchy vegetables - carrots, green  bean, squash, zucchini, tomatoes, onions, peppers,  spinach and other green leafy vegetables, cabbage,  lettuce, cucumbers, asparagus, okra (not fried),  eggplant  limit sugar and processed foods (cakes, cookies, ice  cream, crackers and chips)  Increase fresh fruit but limit serving sizes 1/2 cup or  about the size of tennis or baseball  limit red meat to no more than 1-2 times per week  (serving size about the size of your palm)  Choose whole grains / lean proteins - whole wheat  bread, quinoa, whole grain rice (1/2 cup), fish,  chicken, [place]   Advanced Directives - packet provided.   Start myrbetriq  1 tablet daily for bladder control - patient given #14 samples to try.   Change metformin to XR  2 tablet bid with food to see if loose stools decrease  Start ASA  1 tablet daily  Continue to hold atrovastain and fenofibrate - lipid panel pending - will consider other therapy option after results available.   Patient has not had acute gout episode in over 10 years - checking UA today.  May consider decreasing allopurinol.   Orders Placed This Encounter  Procedures  . Vit D  25 hydroxy (rtn osteoporosis monitoring)  . Uric acid  . CK     Patient Instructions (the written plan) were given to the  patient.   Katelyn Katelyn, [facility]   04/08/2015

## 2015-04-08 NOTE — Patient Instructions (Addendum)
Increase non-starchy vegetables - carrots, green bean, squash, zucchini, tomatoes, onions, peppers, spinach, kale and other green leafy vegetables, cabbage, lettuce, cucumbers, asparagus, okra (not fried), eggplant limit sugar and processed foods (cakes, cookies, ice cream, crackers and chips) Increase fresh fruit but limit serving sizes 1/2 cup or about the size of tennis or baseball limit red meat to no more than 1-2 times per week (serving size about the size of your palm) Choose whole grains / lean proteins - whole wheat bread, quinoa, whole grain rice (1/2 cup), fish, chicken, [place]  Try to increase exercise - start with 5 to 10 minutes a day and work up to 30 minutes a day.   Try Myrbetriq 59m 1 tablet once a day for bladder control - i have given you 2 weeks of samples - call me if if helps and will call in prescription  Start aspirin 810m- take 1 tablet daily for stroke prevention  [facility].   Please return stool test to office after you complete.  Preventive Care for Adults A healthy lifestyle and preventive care can promote health and wellness. Preventive health guidelines for women include the following key practices.  A routine yearly physical is a good way to check with your health care provider about your health and preventive screening. It is a chance to share any concerns and updates on your health and to receive a thorough exam.  Visit your dentist for a routine exam and preventive care every 6 months. Brush your teeth twice a day and floss once a day. Good oral hygiene prevents tooth decay and gum disease.  The frequency of eye exams is based on your age, health, family medical history, use of contact lenses, and other factors. Follow your health care provider's recommendations for frequency of eye exams.  Eat a healthy diet. Foods like vegetables, fruits, whole grains, low-fat dairy products, and lean protein foods  contain the nutrients you need without too many calories. Decrease your intake of foods high in solid fats, added sugars, and salt. Eat the right amount of calories for you.Get information about a proper diet from your health care provider, if necessary.  Regular physical exercise is one of the most important things you can do for your health. Most adults should get at least 150 minutes of moderate-intensity exercise (any activity that increases your heart rate and causes you to sweat) each week. In addition, most adults need muscle-strengthening exercises on 2 or more days a week.  Maintain a healthy weight. The body mass index (BMI) is a screening tool to identify possible weight problems. It provides an estimate of body fat based on height and weight. Your health care provider can find your BMI and can help you achieve or maintain a healthy weight.For adults 20 years and older:  A BMI below 18.5 is considered underweight.  A BMI of 18.5 to 24.9 is normal.  A BMI of 25 to 29.9 is considered overweight.  A BMI of 30 and above is considered obese.  Maintain normal blood lipids and cholesterol levels by exercising and minimizing your intake of saturated fat. Eat a balanced diet with plenty of fruit and vegetables. Blood tests for lipids and cholesterol should begin at age 6427nd be repeated every 5 years. If your lipid or cholesterol levels are high, you are over 50, or you are at high risk for heart disease, you may need your cholesterol levels checked more frequently.Ongoing high  lipid and cholesterol levels should be treated with medicines if diet and exercise are not working.  If you smoke, find out from your health care provider how to quit. If you do not use tobacco, do not start.  Lung cancer screening is recommended for adults aged 89-80 years who are at high risk for developing lung cancer because of a history of smoking. A yearly low-dose CT scan of the lungs is recommended for people  who have at least a 30-pack-year history of smoking and are a current smoker or have quit within the past 15 years. A pack year of smoking is smoking an average of 1 pack of cigarettes a day for 1 year (for example: 1 pack a day for 30 years or 2 packs a day for 15 years). Yearly screening should continue until the smoker has stopped smoking for at least 15 years. Yearly screening should be stopped for people who develop a health problem that would prevent them from having lung cancer treatment.  If you are pregnant, do not drink alcohol. If you are breastfeeding, be very cautious about drinking alcohol. If you are not pregnant and choose to drink alcohol, do not have more than 1 drink per day. One drink is considered to be 12 ounces (355 mL) of beer, 5 ounces (148 mL) of wine, or 1.5 ounces (44 mL) of liquor.  Avoid use of street drugs. Do not share needles with anyone. Ask for help if you need support or instructions about stopping the use of drugs.  High blood pressure causes heart disease and increases the risk of stroke. Your blood pressure should be checked at least every 1 to 2 years. Ongoing high blood pressure should be treated with medicines if weight loss and exercise do not work.  If you are 79-5 years old, ask your health care provider if you should take aspirin to prevent strokes.  Diabetes screening involves taking a blood sample to check your fasting blood sugar level. This should be done once every 3 years, after age 27, if you are within normal weight and without risk factors for diabetes. Testing should be considered at a younger age or be carried out more frequently if you are overweight and have at least 1 risk factor for diabetes.  Breast cancer screening is essential preventive care for women. You should practice "breast self-awareness." This means understanding the normal appearance and feel of your breasts and may include breast self-examination. Any changes detected, no matter  how small, should be reported to a health care provider. Women in their 60s and 30s should have a clinical breast exam (CBE) by a health care provider as part of a regular health exam every 1 to 3 years. After age 35, women should have a CBE every year. Starting at age 54, women should consider having a mammogram (breast X-ray test) every year. Women who have a family history of breast cancer should talk to their health care provider about genetic screening. Women at a high risk of breast cancer should talk to their health care providers about having an MRI and a mammogram every year.  Breast cancer gene (BRCA)-related cancer risk assessment is recommended for women who have family members with BRCA-related cancers. BRCA-related cancers include breast, ovarian, tubal, and peritoneal cancers. Having family members with these cancers may be associated with an increased risk for harmful changes (mutations) in the breast cancer genes BRCA1 and BRCA2. Results of the assessment will determine the need for genetic counseling and  BRCA1 and BRCA2 testing.  Routine pelvic exams to screen for cancer are no longer recommended for nonpregnant women who are considered low risk for cancer of the pelvic organs (ovaries, uterus, and vagina) and who do not have symptoms. Ask your health care provider if a screening pelvic exam is right for you.  If you have had past treatment for cervical cancer or a condition that could lead to cancer, you need Pap tests and screening for cancer for at least 20 years after your treatment. If Pap tests have been discontinued, your risk factors (such as having a new sexual partner) need to be reassessed to determine if screening should be resumed. Some women have medical problems that increase the chance of getting cervical cancer. In these cases, your health care provider may recommend more frequent screening and Pap tests.  The HPV test is an additional test that may be used for cervical  cancer screening. The HPV test looks for the virus that can cause the cell changes on the cervix. The cells collected during the Pap test can be tested for HPV. The HPV test could be used to screen women aged 50 years and older, and should be used in women of any age who have unclear Pap test results. After the age of 74, women should have HPV testing at the same frequency as a Pap test.  Colorectal cancer can be detected and often prevented. Most routine colorectal cancer screening begins at the age of 43 years and continues through age 54 years. However, your health care provider may recommend screening at an earlier age if you have risk factors for colon cancer. On a yearly basis, your health care provider may provide home test kits to check for hidden blood in the stool. Use of a small camera at the end of a tube, to directly examine the colon (sigmoidoscopy or colonoscopy), can detect the earliest forms of colorectal cancer. Talk to your health care provider about this at age 42, when routine screening begins. Direct exam of the colon should be repeated every 5-10 years through age 49 years, unless early forms of pre-cancerous polyps or small growths are found.  People who are at an increased risk for hepatitis B should be screened for this virus. You are considered at high risk for hepatitis B if:  You were born in a country where hepatitis B occurs often. Talk with your health care provider about which countries are considered high risk.  Your parents were born in a high-risk country and you have not received a shot to protect against hepatitis B (hepatitis B vaccine).  You have HIV or AIDS.  You use needles to inject street drugs.  You live with, or have sex with, someone who has hepatitis B.  You get hemodialysis treatment.  You take certain medicines for conditions like cancer, organ transplantation, and autoimmune conditions.  Hepatitis C blood testing is recommended for all people  born from [birth year] through 1965 and any individual with known risks for hepatitis C.  Practice safe sex. Use condoms and avoid high-risk sexual practices to reduce the spread of sexually transmitted infections (STIs). STIs include gonorrhea, chlamydia, syphilis, trichomonas, herpes, HPV, and human immunodeficiency virus (HIV). Herpes, HIV, and HPV are viral illnesses that have no cure. They can result in disability, cancer, and death.  You should be screened for sexually transmitted illnesses (STIs) including gonorrhea and chlamydia if:  You are sexually active and are younger than 24 years.  You are older  than 24 years and your health care provider tells you that you are at risk for this type of infection.  Your sexual activity has changed since you were last screened and you are at an increased risk for chlamydia or gonorrhea. Ask your health care provider if you are at risk.  If you are at risk of being infected with HIV, it is recommended that you take a prescription medicine daily to prevent HIV infection. This is called preexposure prophylaxis (PrEP). You are considered at risk if:  You are a heterosexual woman, are sexually active, and are at increased risk for HIV infection.  You take drugs by injection.  You are sexually active with a partner who has HIV.  Talk with your health care provider about whether you are at high risk of being infected with HIV. If you choose to begin PrEP, you should first be tested for HIV. You should then be tested every 3 months for as long as you are taking PrEP.  Osteoporosis is a disease in which the bones lose minerals and strength with aging. This can result in serious bone fractures or breaks. The risk of osteoporosis can be identified using a bone density scan. Women ages 37 years and over and women at risk for fractures or osteoporosis should discuss screening with their health care providers. Ask your health care provider whether you should take a  calcium supplement or vitamin D to reduce the rate of osteoporosis.  Menopause can be associated with physical symptoms and risks. Hormone replacement therapy is available to decrease symptoms and risks. You should talk to your health care provider about whether hormone replacement therapy is right for you.  Use sunscreen. Apply sunscreen liberally and repeatedly throughout the day. You should seek shade when your shadow is shorter than you. Protect yourself by wearing long sleeves, pants, a wide-brimmed hat, and sunglasses year round, whenever you are outdoors.  Once a month, do a whole body skin exam, using a mirror to look at the skin on your back. Tell your health care provider of new moles, moles that have irregular borders, moles that are larger than a pencil eraser, or moles that have changed in shape or color.  Stay current with required vaccines (immunizations).  Influenza vaccine. All adults should be immunized every year.  Tetanus, diphtheria, and acellular pertussis (Td, Tdap) vaccine. Pregnant women should receive 1 dose of Tdap vaccine during each pregnancy. The dose should be obtained regardless of the length of time since the last dose. Immunization is preferred during the 27th-36th week of gestation. An adult who has not previously received Tdap or who does not know her vaccine status should receive 1 dose of Tdap. This initial dose should be followed by tetanus and diphtheria toxoids (Td) booster doses every 10 years. Adults with an unknown or incomplete history of completing a 3-dose immunization series with Td-containing vaccines should begin or complete a primary immunization series including a Tdap dose. Adults should receive a Td booster every 10 years.  Varicella vaccine. An adult without evidence of immunity to varicella should receive 2 doses or a second dose if she has previously received 1 dose. Pregnant females who do not have evidence of immunity should receive the first  dose after pregnancy. This first dose should be obtained before leaving the health care facility. The second dose should be obtained 4-8 weeks after the first dose.  Human papillomavirus (HPV) vaccine. Females aged 13-26 years who have not received the vaccine previously  should obtain the 3-dose series. The vaccine is not recommended for use in pregnant females. However, pregnancy testing is not needed before receiving a dose. If a female is found to be pregnant after receiving a dose, no treatment is needed. In that case, the remaining doses should be delayed until after the pregnancy. Immunization is recommended for any person with an immunocompromised condition through the age of 32 years if she did not get any or all doses earlier. During the 3-dose series, the second dose should be obtained 4-8 weeks after the first dose. The third dose should be obtained 24 weeks after the first dose and 16 weeks after the second dose.  Zoster vaccine. One dose is recommended for adults aged 55 years or older unless certain conditions are present.  Measles, mumps, and rubella (MMR) vaccine. Adults born before [birth year] generally are considered immune to measles and mumps. Adults born in [birth year] or later should have 1 or more doses of MMR vaccine unless there is a contraindication to the vaccine or there is laboratory evidence of immunity to each of the three diseases. A routine second dose of MMR vaccine should be obtained at least 28 days after the first dose for students attending postsecondary schools, health care workers, or international travelers. People who received inactivated measles vaccine or an unknown type of measles vaccine during 1963-1967 should receive 2 doses of MMR vaccine. People who received inactivated mumps vaccine or an unknown type of mumps vaccine before 1979 and are at high risk for mumps infection should consider immunization with 2 doses of MMR vaccine. For females of childbearing age, rubella  immunity should be determined. If there is no evidence of immunity, females who are not pregnant should be vaccinated. If there is no evidence of immunity, females who are pregnant should delay immunization until after pregnancy. Unvaccinated health care workers born before [birth year] who lack laboratory evidence of measles, mumps, or rubella immunity or laboratory confirmation of disease should consider measles and mumps immunization with 2 doses of MMR vaccine or rubella immunization with 1 dose of MMR vaccine.  Pneumococcal 13-valent conjugate (PCV13) vaccine. When indicated, a person who is uncertain of her immunization history and has no record of immunization should receive the PCV13 vaccine. An adult aged 32 years or older who has certain medical conditions and has not been previously immunized should receive 1 dose of PCV13 vaccine. This PCV13 should be followed with a dose of pneumococcal polysaccharide (PPSV23) vaccine. The PPSV23 vaccine dose should be obtained at least 8 weeks after the dose of PCV13 vaccine. An adult aged 77 years or older who has certain medical conditions and previously received 1 or more doses of PPSV23 vaccine should receive 1 dose of PCV13. The PCV13 vaccine dose should be obtained 1 or more years after the last PPSV23 vaccine dose.  Pneumococcal polysaccharide (PPSV23) vaccine. When PCV13 is also indicated, PCV13 should be obtained first. All adults aged 77 years and older should be immunized. An adult younger than age 48 years who has certain medical conditions should be immunized. Any person who resides in a nursing home or long-term care facility should be immunized. An adult smoker should be immunized. People with an immunocompromised condition and certain other conditions should receive both PCV13 and PPSV23 vaccines. People with human immunodeficiency virus (HIV) infection should be immunized as soon as possible after diagnosis. Immunization during chemotherapy or radiation  therapy should be avoided. Routine use of PPSV23 vaccine is not recommended for American Indians,  [place], or people younger than 65 years unless there are medical conditions that require PPSV23 vaccine. When indicated, people who have unknown immunization and have no record of immunization should receive PPSV23 vaccine. One-time revaccination 5 years after the first dose of PPSV23 is recommended for people aged 19-64 years who have chronic kidney failure, nephrotic syndrome, asplenia, or immunocompromised conditions. People who received 1-2 doses of PPSV23 before age 41 years should receive another dose of PPSV23 vaccine at age 50 years or later if at least 5 years have passed since the previous dose. Doses of PPSV23 are not needed for people immunized with PPSV23 at or after age 25 years.  Meningococcal vaccine. Adults with asplenia or persistent complement component deficiencies should receive 2 doses of quadrivalent meningococcal conjugate (MenACWY-D) vaccine. The doses should be obtained at least 2 months apart. Microbiologists working with certain meningococcal bacteria, [place] recruits, people at risk during an outbreak, and people who travel to or live in countries with a high rate of meningitis should be immunized. A first-year college student up through age 53 years who is living in a residence hall should receive a dose if she did not receive a dose on or after her 16th birthday. Adults who have certain high-risk conditions should receive one or more doses of vaccine.  Hepatitis A vaccine. Adults who wish to be protected from this disease, have certain high-risk conditions, work with hepatitis A-infected animals, work in hepatitis A research labs, or travel to or work in countries with a high rate of hepatitis A should be immunized. Adults who were previously unvaccinated and who anticipate close contact with an international adoptee during the first 60 days after arrival in the [place]  from a country with a high rate of hepatitis A should be immunized.  Hepatitis B vaccine. Adults who wish to be protected from this disease, have certain high-risk conditions, may be exposed to blood or other infectious body fluids, are household contacts or sex partners of hepatitis B positive people, are clients or workers in certain care facilities, or travel to or work in countries with a high rate of hepatitis B should be immunized.  Haemophilus influenzae type b (Hib) vaccine. A previously unvaccinated person with asplenia or sickle cell disease or having a scheduled splenectomy should receive 1 dose of Hib vaccine. Regardless of previous immunization, a recipient of a hematopoietic stem cell transplant should receive a 3-dose series 6-12 months after her successful transplant. Hib vaccine is not recommended for adults with HIV infection. Preventive Services / Frequency Ages 10 years and over  Blood pressure check.** / Every 1 to 2 years.  Lipid and cholesterol check.** / Every 5 years beginning at age 15 years.  Lung cancer screening. / Every year if you are aged 51-80 years and have a 30-pack-year history of smoking and currently smoke or have quit within the past 15 years. Yearly screening is stopped once you have quit smoking for at least 15 years or develop a health problem that would prevent you from having lung cancer treatment.  Clinical breast exam.** / Every year after age 60 years.  BRCA-related cancer risk assessment.** / For women who have family members with a BRCA-related cancer (breast, ovarian, tubal, or peritoneal cancers).  Mammogram.** / Every year beginning at age 50 years and continuing for as long as you are in good health. Consult with your health care provider.  Pap test.** / Every 3 years starting at age 75 years through age 75  or 70 years with 3 consecutive normal Pap tests. Testing can be stopped between 65 and 70 years with 3 consecutive normal Pap tests and no  abnormal Pap or HPV tests in the past 10 years.  HPV screening.** / Every 3 years from ages 44 years through ages 63 or 52 years with a history of 3 consecutive normal Pap tests. Testing can be stopped between 65 and 70 years with 3 consecutive normal Pap tests and no abnormal Pap or HPV tests in the past 10 years.  Fecal occult blood test (FOBT) of stool. / Every year beginning at age 20 years and continuing until age 50 years. You may not need to do this test if you get a colonoscopy every 10 years.  Flexible sigmoidoscopy or colonoscopy.** / Every 5 years for a flexible sigmoidoscopy or every 10 years for a colonoscopy beginning at age 39 years and continuing until age 73 years.  Hepatitis C blood test.** / For all people born from [birth year] through 1966 and any individual with known risks for hepatitis C.  Osteoporosis screening.** / A one-time screening for women ages 80 years and over and women at risk for fractures or osteoporosis.  Skin self-exam. / Monthly.  Influenza vaccine. / Every year.  Tetanus, diphtheria, and acellular pertussis (Tdap/Td) vaccine.** / 1 dose of Td every 10 years.  Varicella vaccine.** / Consult your health care provider.  Zoster vaccine.** / 1 dose for adults aged 79 years or older.  Pneumococcal 13-valent conjugate (PCV13) vaccine.** / Consult your health care provider.  Pneumococcal polysaccharide (PPSV23) vaccine.** / 1 dose for all adults aged 109 years and older.  Meningococcal vaccine.** / Consult your health care provider.  Hepatitis A vaccine.** / Consult your health care provider.  Hepatitis B vaccine.** / Consult your health care provider.  Haemophilus influenzae type b (Hib) vaccine.** / Consult your health care provider. ** Family history and personal history of risk and conditions may change your health care provider's recommendations. Document Released: 02/12/2002 Document Revised: 05/03/2014 Document Reviewed: 05/14/2011 [facility] Patient  Information 2015 [place], [place]. This information is not intended to replace advice given to you by your health care provider. Make sure you discuss any questions you have with your health care provider.

## 2015-04-09 LAB — CK: Total CK: 115 U/L (ref 24–173)

## 2015-04-09 LAB — VITAMIN D 25 HYDROXY (VIT D DEFICIENCY, FRACTURES): Vit D, 25-Hydroxy: 41.7 ng/mL (ref 30.0–100.0)

## 2015-04-09 LAB — URIC ACID: URIC ACID: 4.1 mg/dL (ref 2.5–7.1)

## 2015-04-15 ENCOUNTER — Telehealth: Payer: Self-pay | Admitting: Pharmacist

## 2015-04-15 NOTE — Telephone Encounter (Signed)
All labs were WNL.  Recommend retry another statin since could not tolerate atorvastatin.  Suggest crestor 10mg  1 tablet daily. Tried to call patient - left message to call office to discuss labs.

## 2015-04-18 NOTE — Telephone Encounter (Signed)
Tried to call again - left message to call office for lab results and to discuss starting statin therapy.

## 2015-04-22 ENCOUNTER — Other Ambulatory Visit: Payer: Self-pay | Admitting: Pharmacist

## 2015-04-22 MED ORDER — PRAVASTATIN SODIUM 40 MG PO TABS: 40.0000 mg | ORAL_TABLET | Freq: Every day | ORAL | Status: DC

## 2015-04-22 NOTE — Telephone Encounter (Signed)
Unable to reach patient - letter sent with lab results adn Rx for pravastatin sent to pharmacy.

## 2015-05-23 ENCOUNTER — Encounter: Payer: Self-pay | Admitting: *Deleted

## 2015-06-27 ENCOUNTER — Encounter: Payer: Self-pay | Admitting: Nurse Practitioner

## 2015-06-27 ENCOUNTER — Ambulatory Visit (INDEPENDENT_AMBULATORY_CARE_PROVIDER_SITE_OTHER): Payer: Medicare Other | Admitting: Nurse Practitioner

## 2015-06-27 ENCOUNTER — Ambulatory Visit (INDEPENDENT_AMBULATORY_CARE_PROVIDER_SITE_OTHER): Payer: Medicare Other

## 2015-06-27 VITALS — BP 111/70 | HR 67 | Temp 97.6°F | Ht 63.75 in | Wt 191.2 lb

## 2015-06-27 DIAGNOSIS — Z87891 Personal history of nicotine dependence: Secondary | ICD-10-CM

## 2015-06-27 DIAGNOSIS — E785 Hyperlipidemia, unspecified: Secondary | ICD-10-CM | POA: Diagnosis not present

## 2015-06-27 DIAGNOSIS — I10 Essential (primary) hypertension: Secondary | ICD-10-CM | POA: Diagnosis not present

## 2015-06-27 DIAGNOSIS — Z720 Tobacco use: Secondary | ICD-10-CM

## 2015-06-27 DIAGNOSIS — E119 Type 2 diabetes mellitus without complications: Secondary | ICD-10-CM

## 2015-06-27 DIAGNOSIS — M1A00X0 Idiopathic chronic gout, unspecified site, without tophus (tophi): Secondary | ICD-10-CM | POA: Diagnosis not present

## 2015-06-27 LAB — POCT GLYCOSYLATED HEMOGLOBIN (HGB A1C): Hemoglobin A1C: 7.8

## 2015-06-27 MED ORDER — FENOFIBRATE 145 MG PO TABS: 145.0000 mg | ORAL_TABLET | Freq: Every day | ORAL | Status: DC

## 2015-06-27 MED ORDER — GLIMEPIRIDE 4 MG PO TABS: 4.0000 mg | ORAL_TABLET | Freq: Every day | ORAL | Status: DC

## 2015-06-27 MED ORDER — ALLOPURINOL 300 MG PO TABS: 300.0000 mg | ORAL_TABLET | Freq: Every day | ORAL | Status: DC

## 2015-06-27 MED ORDER — PRAVASTATIN SODIUM 40 MG PO TABS: 40.0000 mg | ORAL_TABLET | Freq: Every day | ORAL | Status: DC

## 2015-06-27 MED ORDER — AMLODIPINE BESYLATE 2.5 MG PO TABS: 2.5000 mg | ORAL_TABLET | Freq: Every day | ORAL | Status: DC

## 2015-06-27 MED ORDER — LISINOPRIL 40 MG PO TABS: 40.0000 mg | ORAL_TABLET | Freq: Every day | ORAL | Status: DC

## 2015-06-27 MED ORDER — METFORMIN HCL 1000 MG PO TABS: 1000.0000 mg | ORAL_TABLET | Freq: Two times a day (BID) | ORAL | Status: DC

## 2015-06-27 NOTE — Progress Notes (Signed)
Subjective:    Patient ID: Katelyn Ballard, female    DOB: 1945/04/08, 70 y.o.   MRN: [id number]  Patient here today for follow up- no complaints and no changes since last visit.  Hyperlipidemia This is a chronic problem. The problem is controlled. Recent lipid tests were reviewed and are normal. Exacerbating diseases include diabetes and obesity. She has no history of hypothyroidism. Pertinent negatives include no chest pain, myalgias or shortness of breath. Current antihyperlipidemic treatment includes statins and fibric acid derivatives. The current treatment provides moderate improvement of lipids. Compliance problems include adherence to diet and adherence to exercise.  Risk factors for coronary artery disease include diabetes mellitus, dyslipidemia, hypertension, obesity and post-menopausal.  Diabetes She presents for her follow-up diabetic visit. She has type 2 diabetes mellitus. No MedicAlert identification noted. Pertinent negatives for diabetes include no chest pain, no polydipsia, no polyphagia, no polyuria and no visual change. Risk factors for coronary artery disease include diabetes mellitus, dyslipidemia, family history, obesity, post-menopausal and hypertension. Current diabetic treatment includes oral agent (monotherapy). Her weight is stable. When asked about meal planning, she reported none. She never participates in exercise. Her breakfast blood glucose is taken between 8-9 am. Her breakfast blood glucose range is generally 140-180 mg/dl. Her highest blood glucose is >200 mg/dl. Her overall blood glucose range is 140-180 mg/dl. An ACE inhibitor/angiotensin II receptor blocker is being taken. She does not see a podiatrist.Eye exam is not current.  Hypertension This is a chronic problem. The current episode started more than 1 year ago. The problem is unchanged. The problem is controlled. Pertinent negatives include no chest pain, palpitations, peripheral edema or shortness of breath.  Risk factors for coronary artery disease include diabetes mellitus, dyslipidemia, family history, obesity and post-menopausal state. Past treatments include calcium channel blockers and ACE inhibitors. The current treatment provides moderate improvement. Compliance problems include diet and exercise.   GOUT Taking allopurinol daily- no recent flareups  Urinary frequency myrbetriq working well to keep symptoms under control but makes her stomach hurt si she stopped taking- does not want anything else at this time.    Review of Systems  Constitutional: Negative.   HENT: Negative.   Respiratory: Negative for shortness of breath.   Cardiovascular: Negative for chest pain and palpitations.  Endocrine: Negative for polydipsia, polyphagia and polyuria.  Genitourinary:       Occasional urinary incontinence - only occurs when sheis not near a restroom when she has to go.  Musculoskeletal: Negative for myalgias.  All other systems reviewed and are negative.      Objective:   Physical Exam  Constitutional: She is oriented to person, place, and time. She appears well-developed and well-nourished.  HENT:  Nose: Nose normal.  Mouth/Throat: Oropharynx is clear and moist.  Eyes: EOM are normal.  Neck: Trachea normal, normal range of motion and full passive range of motion without pain. Neck supple. No JVD present. Carotid bruit is not present. No thyromegaly present.  Cardiovascular: Normal rate, regular rhythm, normal heart sounds and intact distal pulses.  Exam reveals no gallop and no friction rub.   No murmur heard. Pulmonary/Chest: Effort normal and breath sounds normal.  Abdominal: Soft. Bowel sounds are normal. She exhibits no distension and no mass. There is no tenderness.  Musculoskeletal: Normal range of motion.  Lymphadenopathy:    She has no cervical adenopathy.  Neurological: She is alert and oriented to person, place, and time. She has normal reflexes.  Skin: Skin is warm and dry.  Psychiatric: She has a normal mood and affect. Her behavior is normal. Judgment and thought content normal.    BP 111/70 mmHg  Pulse 67  Temp(Src) 97.6 F (36.4 C) (Oral)  Ht 5' 3.75" (1.619 m)  Wt 191 lb 3.2 oz (86.728 kg)  BMI 33.09 kg/m2      Results for orders placed or performed in visit on 06/27/15  POCT glycosylated hemoglobin (Hb A1C)  Result Value Ref Range   Hemoglobin A1C 7.8    Chest x ray- no acute or chronic findings-Preliminary reading by Ronnald Collum, FNP  [facility]   EKG- Kerry Hough, FNP     Assessment & Plan:   1. Hyperlipidemia with target LDL less than 100 Low fat diet - Lipid panel - fenofibrate (TRICOR) 145 MG tablet; Take 1 tablet (145 mg total) by mouth daily.  Dispense: 90 tablet; Refill: 1 - pravastatin (PRAVACHOL) 40 MG tablet; Take 1 tablet (40 mg total) by mouth daily.  Dispense: 90 tablet; Refill: 1  2. Essential hypertension Do not add slat todiet - CMP14+EGFR - EKG 12-Lead - lisinopril (PRINIVIL,ZESTRIL) 40 MG tablet; Take 1 tablet (40 mg total) by mouth daily.  Dispense: 90 tablet; Refill: 1 - amLODipine (NORVASC) 2.5 MG tablet; Take 1 tablet (2.5 mg total) by mouth daily.  Dispense: 90 tablet; Refill: 1  3. Type 2 diabetes mellitus without complication Watch carbs in diet Increased amaryl form 2 mg daily to 79m daily - POCT glycosylated hemoglobin (Hb A1C) - metFORMIN (GLUCOPHAGE) 1000 MG tablet; Take 1 tablet (1,000 mg total) by mouth 2 (two) times daily with a meal.  Dispense: 180 tablet; Refill: 1 - glimepiride (AMARYL) 4 MG tablet; Take 1 tablet (4 mg total) by mouth daily with breakfast.  Dispense: 90 tablet; Refill: 1  4. Smoking hx - DG Chest 2 View; Future  5. Idiopathic chronic gout without tophus, unspecified site - allopurinol (ZYLOPRIM) 300 MG tablet; Take 1 tablet (300 mg total) by mouth daily.  Dispense: 90 tablet; Refill: 1    Labs pending Health maintenance reviewed Diet and exercise  encouraged Continue all meds Follow up  In 3 months   [place] FNP

## 2015-06-27 NOTE — Patient Instructions (Signed)

## 2015-06-28 ENCOUNTER — Other Ambulatory Visit: Payer: Self-pay | Admitting: Nurse Practitioner

## 2015-06-28 LAB — CMP14+EGFR
ALK PHOS: 65 IU/L (ref 39–117)
ALT: 13 IU/L (ref 0–32)
AST: 9 IU/L (ref 0–40)
Albumin/Globulin Ratio: 1.3 (ref 1.1–2.5)
Albumin: 4.2 g/dL (ref 3.5–4.8)
BUN/Creatinine Ratio: 23 (ref 11–26)
BUN: 23 mg/dL (ref 8–27)
Bilirubin Total: 0.3 mg/dL (ref 0.0–1.2)
CO2: 20 mmol/L (ref 18–29)
Calcium: 9.5 mg/dL (ref 8.7–10.3)
Chloride: 105 mmol/L (ref 97–108)
Creatinine, Ser: 1.01 mg/dL — ABNORMAL HIGH (ref 0.57–1.00)
GFR calc Af Amer: 65 mL/min/{1.73_m2} (ref >59)
GFR calc non Af Amer: 57 mL/min/{1.73_m2} — ABNORMAL LOW (ref >59)
GLUCOSE: 166 mg/dL — AB (ref 65–99)
Globulin, Total: 3.3 g/dL (ref 1.5–4.5)
POTASSIUM: 4.6 mmol/L (ref 3.5–5.2)
Sodium: 141 mmol/L (ref 134–144)
Total Protein: 7.5 g/dL (ref 6.0–8.5)

## 2015-06-28 LAB — LIPID PANEL
Chol/HDL Ratio: 4.7 ratio units — ABNORMAL HIGH (ref 0.0–4.4)
Cholesterol, Total: 178 mg/dL (ref 100–199)
HDL: 38 mg/dL — ABNORMAL LOW (ref >39)
LDL Calculated: 105 mg/dL — ABNORMAL HIGH (ref 0–99)
Triglycerides: 174 mg/dL — ABNORMAL HIGH (ref 0–149)
VLDL CHOLESTEROL CAL: 35 mg/dL (ref 5–40)

## 2015-07-30 ENCOUNTER — Other Ambulatory Visit: Payer: Self-pay | Admitting: Nurse Practitioner

## 2015-08-06 ENCOUNTER — Other Ambulatory Visit: Payer: Self-pay | Admitting: Nurse Practitioner

## 2015-08-08 NOTE — Telephone Encounter (Signed)
Last seen 06/27/15 MMM   Last Vit D  04/08/15  41.7

## 2015-10-04 ENCOUNTER — Encounter: Payer: Self-pay | Admitting: Nurse Practitioner

## 2015-10-04 ENCOUNTER — Ambulatory Visit (INDEPENDENT_AMBULATORY_CARE_PROVIDER_SITE_OTHER): Payer: Medicare Other | Admitting: Nurse Practitioner

## 2015-10-04 VITALS — BP 136/84 | HR 64 | Temp 97.2°F | Ht 63.75 in | Wt 193.4 lb

## 2015-10-04 DIAGNOSIS — E119 Type 2 diabetes mellitus without complications: Secondary | ICD-10-CM

## 2015-10-04 DIAGNOSIS — I159 Secondary hypertension, unspecified: Secondary | ICD-10-CM

## 2015-10-04 DIAGNOSIS — M1A9XX0 Chronic gout, unspecified, without tophus (tophi): Secondary | ICD-10-CM | POA: Diagnosis not present

## 2015-10-04 DIAGNOSIS — R7989 Other specified abnormal findings of blood chemistry: Secondary | ICD-10-CM

## 2015-10-04 DIAGNOSIS — R0689 Other abnormalities of breathing: Secondary | ICD-10-CM | POA: Diagnosis not present

## 2015-10-04 DIAGNOSIS — R0989 Other specified symptoms and signs involving the circulatory and respiratory systems: Secondary | ICD-10-CM

## 2015-10-04 DIAGNOSIS — Z6833 Body mass index (BMI) 33.0-33.9, adult: Secondary | ICD-10-CM

## 2015-10-04 DIAGNOSIS — R350 Frequency of micturition: Secondary | ICD-10-CM

## 2015-10-04 DIAGNOSIS — E785 Hyperlipidemia, unspecified: Secondary | ICD-10-CM | POA: Diagnosis not present

## 2015-10-04 LAB — POCT GLYCOSYLATED HEMOGLOBIN (HGB A1C): HEMOGLOBIN A1C: 7.9

## 2015-10-04 MED ORDER — GLIMEPIRIDE 4 MG PO TABS: ORAL_TABLET | ORAL | Status: DC

## 2015-10-04 NOTE — Patient Instructions (Signed)
Choking °Choking occurs when a food or object gets stuck in the throat or trachea, blocking the airway. If the airway is partly blocked, coughing will usually cause the food or object to come out. If the airway is completely blocked, immediate action is needed to help it come out. A complete airway blockage is life threatening because it causes breathing to stop. Choking is a true medical emergency that requires fast, appropriate action by anyone available. °SIGNS OF AIRWAY BLOCKAGE °There is a partial airway blockage if you or the person who is choking is:  °· Able to breathe and speak. °· Coughing loudly. °· Making loud noises. °There is a complete airway blockage if you or the person who is choking is:  °· Unable to breathe. °· Making soft or high-pitched sounds while breathing. °· Unable to cough or coughing weakly, ineffectively, or silently. °· Unable to cry, speak, or make sounds. °· Turning blue. °· Holding the neck with both arms. This is the universal sign of choking. °WHAT TO DO IF CHOKING OCCURS °If there is a partial airway blockage, allow coughing to clear the airway. Do not try to drink until the food or object comes out. If someone else has a partial airway blockage, do not interfere. Stay with him or her and watch for signs of complete airway blockage until the food or object comes out.  °If there is a complete airway blockage or if there is a partial airway blockage and the food or object does not come out, perform abdominal thrusts (also referred to as the Heimlich maneuver). Abdominal thrusts are used to create an artificial cough to try to clear the airway. Performing abdominal thrusts is part of a series of steps that should be done to help someone who is choking. Abdominal thrusts are usually done by someone else, but if you are alone, you can perform abdominal thrusts on yourself. Follow the procedure below that best fits your situation.  °IF SOMEONE ELSE IS CHOKING: °For a conscious adult:    °1. Ask the person whether he or she is choking. If the person nods, continue to step 2. °2. Stand or kneel behind the person and lean him or her forward slightly. °3. Make a fist with 1 hand, put your arms around the person, and grasp your fist with your other hand. Place the thumb side of your fist in the person's abdomen, just below the ribs. °4. Press inward and upward with both hands. °5. Repeat this maneuver until the object comes out and the person is able to breathe or until the person loses consciousness. °For an unconscious adult: °1. Shout for help. If someone responds, have him or her call local emergency services (911 in [place] If no one responds, call local emergency services yourself if possible. °2. Begin CPR, starting with compressions. Every time you open the airway to give rescue breaths, open the person's mouth. If you can see the food or object and it can be easily pulled out, remove it with your fingers. °3. After 5 cycles or 2 minutes of CPR, call local emergency services (911 in [place] if you or someone else did not already call. °For a conscious adult who is obese or in the later stages of pregnancy: °Abdominal thrusts may not be effective when helping people who are in the later stages of pregnancy or who are obese. In these instances, chest thrusts can be used.  °1. Ask the person whether he or she is choking. If the person   nods and has signs of complete airway blockage, continue to step 2. °2. Stand behind the person and wrap your arms around his or her chest (with your arms under the person's armpits). °3. Make a fist with 1 hand. Place the thumb side of your fist on the middle of the person's breastbone. °4. Grab your fist with your other hand and thrust backward. Continue this until the object comes out or until the person becomes unconscious. °For an unconscious adult who is obese or in the later stages of pregnancy:  °1. Shout for help. If someone responds, have him or her call local  emergency services (911 in [place] If no one responds, call local emergency services yourself if possible. °2. Begin CPR, starting with compressions. Every time you open the airway to give rescue breaths, open the person's mouth. If you can see the food or object and it can be easily pulled out, remove it with your fingers. °3. After 5 cycles or 2 minutes of CPR, call local emergency services (911 in [place] if you or someone else did not already call. °Note that abdominal thrusts (below the rib cage) should be used for a pregnant woman if possible. This should be possible until the later stages of pregnancy when there is no longer enough room between the enlarging uterus and the rib cage to perform the maneuver. At that point, chest thrusts must be used as described. °IF YOU ARE CHOKING: °1. Call local emergency services (911 in [place] if near a landline. Do not worry about communicating what is happening. Do not hang up the phone. Someone may be sent to help you anyway. °2. Make a fist with 1 hand. Put the thumb side of the fist against your stomach, just above the belly button and well below the breastbone. If you are pregnant or obese, put your fist on your chest instead, just below the breastbone and just above your lowest ribs. °3. Hold your fist with your other hand and bend over a hard surface, such as a table or chair. °4. Forcefully push your fist in and up. °5. Continue to do this until the food or object comes out. °PREVENTION  °To be prepared if choking occurs, learn how to correctly perform abdominal thrusts and give CPR by taking a certified first-aid training course.  °SEEK IMMEDIATE MEDICAL CARE IF: °· You have a fever after choking stops. °· You have problems breathing after choking stops. °· You received the Heimlich maneuver. °MAKE SURE YOU: °· Understand these instructions. °· Watch your condition. °· Get help right away if you are not doing well or get worse. °Document Released: 01/24/2005 Document  Revised: 05/03/2014 Document Reviewed: 07/29/2012 °[employer]® Patient Information ©2015 [employer]. This information is not intended to replace advice given to you by your health care provider. Make sure you discuss any questions you have with your health care provider. ° °

## 2015-10-04 NOTE — Progress Notes (Signed)
Subjective:    Patient ID: Katelyn Ballard, female    DOB: 08/19/1945, 70 y.o.   MRN: [id number]  Patient here today for follow up- no complaints and no changes since last visit.  Diabetes She presents for her follow-up diabetic visit. She has type 2 diabetes mellitus. No MedicAlert identification noted. Pertinent negatives for diabetes include no chest pain, no polydipsia, no polyphagia, no polyuria and no visual change. Risk factors for coronary artery disease include diabetes mellitus, dyslipidemia, family history, obesity, post-menopausal and hypertension. Current diabetic treatment includes oral agent (monotherapy). Her weight is stable. When asked about meal planning, she reported none. She never participates in exercise. Her breakfast blood glucose is taken between 8-9 am. Her breakfast blood glucose range is generally 140-180 mg/dl. Her highest blood glucose is >200 mg/dl. Her overall blood glucose range is 140-180 mg/dl. An ACE inhibitor/angiotensin II receptor blocker is being taken. She does not see a podiatrist.Eye exam is not current.  Hyperlipidemia This is a chronic problem. The problem is controlled. Recent lipid tests were reviewed and are normal. Exacerbating diseases include diabetes and obesity. She has no history of hypothyroidism. Pertinent negatives include no chest pain, myalgias or shortness of breath. Current antihyperlipidemic treatment includes statins and fibric acid derivatives. The current treatment provides moderate improvement of lipids. Compliance problems include adherence to diet and adherence to exercise.  Risk factors for coronary artery disease include diabetes mellitus, dyslipidemia, hypertension, obesity and post-menopausal.  Hypertension This is a chronic problem. The current episode started more than 1 year ago. The problem is unchanged. The problem is controlled. Pertinent negatives include no chest pain, palpitations, peripheral edema or shortness of breath.  Risk factors for coronary artery disease include diabetes mellitus, dyslipidemia, family history, obesity and post-menopausal state. Past treatments include calcium channel blockers and ACE inhibitors. The current treatment provides moderate improvement. Compliance problems include diet and exercise.   GOUT Taking allopurinol daily- no recent flareups  Urinary frequency myrbetriq working well to keep symptoms under control but makes her stomach hurt si she stopped taking- does not want anything else at this time.    Review of Systems  Constitutional: Negative.   HENT: Negative.   Respiratory: Negative for shortness of breath.   Cardiovascular: Negative for chest pain and palpitations.  Endocrine: Negative for polydipsia, polyphagia and polyuria.  Genitourinary:       Occasional urinary incontinence - only occurs when sheis not near a restroom when she has to go.  Musculoskeletal: Negative for myalgias.  All other systems reviewed and are negative.      Objective:   Physical Exam  Constitutional: She is oriented to person, place, and time. She appears well-developed and well-nourished.  HENT:  Nose: Nose normal.  Mouth/Throat: Oropharynx is clear and moist.  Eyes: EOM are normal.  Neck: Trachea normal, normal range of motion and full passive range of motion without pain. Neck supple. No JVD present. Carotid bruit is not present. No thyromegaly present.  Cardiovascular: Normal rate, regular rhythm, normal heart sounds and intact distal pulses.  Exam reveals no gallop and no friction rub.   No murmur heard. Pulmonary/Chest: Effort normal and breath sounds normal.  Abdominal: Soft. Bowel sounds are normal. She exhibits no distension and no mass. There is no tenderness.  Musculoskeletal: Normal range of motion.  Lymphadenopathy:    She has no cervical adenopathy.  Neurological: She is alert and oriented to person, place, and time. She has normal reflexes.  Skin: Skin is warm and dry.  Psychiatric: She has a normal mood and affect. Her behavior is normal. Judgment and thought content normal.    BP 136/84 mmHg  Pulse 64  Temp(Src) 97.2 F (36.2 C) (Oral)  Ht 5' 3.75" (1.619 m)  Wt 193 lb 6.4 oz (87.726 kg)  BMI 33.47 kg/m2      Results for orders placed or performed in visit on 10/04/15  POCT glycosylated hemoglobin (Hb A1C)  Result Value Ref Range   Hemoglobin A1C 7.9        Assessment & Plan:   1. Secondary hypertension, unspecified Do not add salt to diet - CMP14+EGFR - Lipid panel  2. Type 2 diabetes mellitus without complication, without long-term current use of insulin (HCC) Stricter carb counting- Increased amaryl to 1 qam  And 1/2 q in evening - POCT glycosylated hemoglobin (Hb A1C) - glimepiride (AMARYL) 4 MG tablet; 1 po Qam and 1/2 po In evening  Dispense: 135 tablet; Refill: 1  3. Hyperlipidemia with target LDL less than 100 low fat diet  4. Chronic gout without tophus, unspecified cause, unspecified site Low purine diet  5. Low serum vitamin D  6. Urinary frequency  7. BMI 33.0-33.9,adult Discussed diet and exercise for person with BMI >25 Will recheck weight in 3-6 months  8. Choking episode occurring during daytime Keep diary of episodes- if start occurring frequently let me know Chew all foods well before swallowing  Keep appointment for mammogram Refuses flu shot Labs pending Health maintenance reviewed Diet and exercise encouraged Continue all meds Follow up  In 3 months   [place], FNP

## 2015-10-05 LAB — CMP14+EGFR
ALBUMIN: 4 g/dL (ref 3.5–4.8)
ALT: 16 IU/L (ref 0–32)
AST: 9 IU/L (ref 0–40)
Albumin/Globulin Ratio: 1.3 (ref 1.1–2.5)
Alkaline Phosphatase: 63 IU/L (ref 39–117)
BUN / CREAT RATIO: 24 (ref 11–26)
BUN: 19 mg/dL (ref 8–27)
Bilirubin Total: 0.3 mg/dL (ref 0.0–1.2)
CALCIUM: 9.3 mg/dL (ref 8.7–10.3)
CO2: 22 mmol/L (ref 18–29)
Chloride: 104 mmol/L (ref 97–108)
Creatinine, Ser: 0.8 mg/dL (ref 0.57–1.00)
GFR calc Af Amer: 86 mL/min/{1.73_m2} (ref >59)
GFR calc non Af Amer: 74 mL/min/{1.73_m2} (ref >59)
GLOBULIN, TOTAL: 3.2 g/dL (ref 1.5–4.5)
Glucose: 175 mg/dL — ABNORMAL HIGH (ref 65–99)
POTASSIUM: 4.4 mmol/L (ref 3.5–5.2)
SODIUM: 143 mmol/L (ref 134–144)
Total Protein: 7.2 g/dL (ref 6.0–8.5)

## 2015-10-05 LAB — LIPID PANEL
Chol/HDL Ratio: 4.9 ratio units — ABNORMAL HIGH (ref 0.0–4.4)
Cholesterol, Total: 172 mg/dL (ref 100–199)
HDL: 35 mg/dL — ABNORMAL LOW (ref >39)
LDL Calculated: 106 mg/dL — ABNORMAL HIGH (ref 0–99)
Triglycerides: 153 mg/dL — ABNORMAL HIGH (ref 0–149)
VLDL Cholesterol Cal: 31 mg/dL (ref 5–40)

## 2015-10-26 ENCOUNTER — Encounter: Payer: Self-pay | Admitting: *Deleted

## 2015-12-01 ENCOUNTER — Telehealth: Payer: Self-pay | Admitting: Nurse Practitioner

## 2015-12-01 MED ORDER — FENOFIBRATE 160 MG PO TABS: 160.0000 mg | ORAL_TABLET | Freq: Every day | ORAL | Status: DC

## 2015-12-01 NOTE — Telephone Encounter (Signed)
Insurance will cover fenofibrate 160 mg not fenofibrate 145mg . Will you approve change to 160mg ? Prescription setup to sign if approved and note made to pharmacist to discontinue 145mg .

## 2015-12-01 NOTE — Telephone Encounter (Signed)
medication changed and rx sent to pharmacy

## 2015-12-01 NOTE — Telephone Encounter (Signed)
Patient was aware that we would send in new prescription if approved.

## 2015-12-07 ENCOUNTER — Other Ambulatory Visit: Payer: Self-pay | Admitting: *Deleted

## 2015-12-07 DIAGNOSIS — Z1211 Encounter for screening for malignant neoplasm of colon: Secondary | ICD-10-CM

## 2016-01-05 ENCOUNTER — Other Ambulatory Visit: Payer: Self-pay | Admitting: Nurse Practitioner

## 2016-01-11 ENCOUNTER — Other Ambulatory Visit: Payer: Self-pay | Admitting: Nurse Practitioner

## 2016-01-13 ENCOUNTER — Encounter: Payer: Self-pay | Admitting: Nurse Practitioner

## 2016-01-13 ENCOUNTER — Other Ambulatory Visit: Payer: Self-pay | Admitting: Nurse Practitioner

## 2016-01-13 ENCOUNTER — Ambulatory Visit (INDEPENDENT_AMBULATORY_CARE_PROVIDER_SITE_OTHER): Payer: Medicare Other | Admitting: Nurse Practitioner

## 2016-01-13 DIAGNOSIS — Z6833 Body mass index (BMI) 33.0-33.9, adult: Secondary | ICD-10-CM | POA: Diagnosis not present

## 2016-01-13 DIAGNOSIS — E785 Hyperlipidemia, unspecified: Secondary | ICD-10-CM | POA: Diagnosis not present

## 2016-01-13 DIAGNOSIS — M1A9XX0 Chronic gout, unspecified, without tophus (tophi): Secondary | ICD-10-CM

## 2016-01-13 DIAGNOSIS — R7989 Other specified abnormal findings of blood chemistry: Secondary | ICD-10-CM

## 2016-01-13 DIAGNOSIS — E119 Type 2 diabetes mellitus without complications: Secondary | ICD-10-CM | POA: Diagnosis not present

## 2016-01-13 DIAGNOSIS — I10 Essential (primary) hypertension: Secondary | ICD-10-CM

## 2016-01-13 LAB — POCT GLYCOSYLATED HEMOGLOBIN (HGB A1C): HEMOGLOBIN A1C: 6.7

## 2016-01-13 MED ORDER — GLIMEPIRIDE 4 MG PO TABS: ORAL_TABLET | ORAL | Status: DC

## 2016-01-13 MED ORDER — PRAVASTATIN SODIUM 40 MG PO TABS: 40.0000 mg | ORAL_TABLET | Freq: Every day | ORAL | Status: DC

## 2016-01-13 MED ORDER — FENOFIBRATE 160 MG PO TABS: 160.0000 mg | ORAL_TABLET | Freq: Every day | ORAL | Status: DC

## 2016-01-13 MED ORDER — ALLOPURINOL 300 MG PO TABS: 300.0000 mg | ORAL_TABLET | Freq: Every day | ORAL | Status: DC

## 2016-01-13 MED ORDER — METFORMIN HCL 1000 MG PO TABS: 1000.0000 mg | ORAL_TABLET | Freq: Two times a day (BID) | ORAL | Status: DC

## 2016-01-13 MED ORDER — LISINOPRIL 40 MG PO TABS: 40.0000 mg | ORAL_TABLET | Freq: Every day | ORAL | Status: DC

## 2016-01-13 MED ORDER — AMLODIPINE BESYLATE 2.5 MG PO TABS: 2.5000 mg | ORAL_TABLET | Freq: Every day | ORAL | Status: DC

## 2016-01-13 NOTE — Patient Instructions (Signed)

## 2016-01-13 NOTE — Progress Notes (Signed)
Subjective:    Patient ID: Katelyn Ballard, female    DOB: [birth year], 71 y.o.   MRN: [id number]  Patient here today for follow up- no complaints and no changes since last visit.  Outpatient Encounter Prescriptions as of 01/13/2016  Medication Sig  . allopurinol (ZYLOPRIM) 300 MG tablet TAKE ONE TABLET BY MOUTH ONCE DAILY  . amLODipine (NORVASC) 2.5 MG tablet TAKE ONE TABLET BY MOUTH ONCE DAILY  . calcium-vitamin D (OSCAL WITH D) 500-200 MG-UNIT per tablet Take 1 tablet by mouth 2 (two) times daily.  . fenofibrate 160 MG tablet Take 1 tablet (160 mg total) by mouth daily.  Marland Kitchen glimepiride (AMARYL) 4 MG tablet 1 po Qam and 1/2 po In evening  . ibuprofen (ADVIL,MOTRIN) 600 MG tablet TAKE ONE TABLET BY MOUTH EVERY 6 HOURS AS NEEDED  . lisinopril (PRINIVIL,ZESTRIL) 40 MG tablet Take 1 tablet (40 mg total) by mouth daily.  . metFORMIN (GLUCOPHAGE) 1000 MG tablet Take 1 tablet (1,000 mg total) by mouth 2 (two) times daily with a meal.  . ONE TOUCH ULTRA TEST test strip USE ONE STRIP TO CHECK GLUCOSE 2 TO 3 TIMES DAILY  . pravastatin (PRAVACHOL) 40 MG tablet TAKE ONE TABLET BY MOUTH ONCE DAILY  . Vitamin D, Ergocalciferol, (DRISDOL) 50000 units CAPS capsule TAKE ONE CAPSULE BY MOUTH ONCE A WEEK  . [DISCONTINUED] mirabegron ER (MYRBETRIQ) 25 MG TB24 tablet Take 1 tablet (25 mg total) by mouth daily.   No facility-administered encounter medications on file as of 01/13/2016.     Hyperlipidemia This is a chronic problem. The problem is controlled. Recent lipid tests were reviewed and are normal. Exacerbating diseases include diabetes and obesity. She has no history of hypothyroidism. Pertinent negatives include no chest pain, myalgias or shortness of breath. Current antihyperlipidemic treatment includes statins and fibric acid derivatives. The current treatment provides moderate improvement of lipids. Compliance problems include adherence to diet and adherence to exercise.  Risk factors for coronary  artery disease include diabetes mellitus, dyslipidemia, hypertension, obesity and post-menopausal.  Hypertension This is a chronic problem. The current episode started more than 1 year ago. The problem is unchanged. The problem is controlled. Pertinent negatives include no chest pain, palpitations, peripheral edema or shortness of breath. Risk factors for coronary artery disease include diabetes mellitus, dyslipidemia, family history, obesity and post-menopausal state. Past treatments include calcium channel blockers and ACE inhibitors. The current treatment provides moderate improvement. Compliance problems include diet and exercise.   Diabetes She presents for her follow-up diabetic visit. She has type 2 diabetes mellitus. No MedicAlert identification noted. Pertinent negatives for diabetes include no chest pain, no polydipsia, no polyphagia, no polyuria and no visual change. Risk factors for coronary artery disease include diabetes mellitus, dyslipidemia, family history, obesity, post-menopausal and hypertension. Current diabetic treatment includes oral agent (monotherapy). Her weight is stable. When asked about meal planning, she reported none. She never participates in exercise. Her breakfast blood glucose is taken between 8-9 am. Her breakfast blood glucose range is generally 140-180 mg/dl. Her highest blood glucose is >200 mg/dl. Her overall blood glucose range is 140-180 mg/dl. An ACE inhibitor/angiotensin II receptor blocker is being taken. She does not see a podiatrist.Eye exam is not current.  GOUT Taking allopurinol daily- no recent flareups  Low vitamin d On vit d 50000iu weekly- no side effects    Review of Systems  Constitutional: Negative.   HENT: Negative.   Respiratory: Negative for shortness of breath.   Cardiovascular: Negative for chest pain  and palpitations.  Endocrine: Negative for polydipsia, polyphagia and polyuria.  Genitourinary:       Occasional urinary incontinence -  only occurs when sheis not near a restroom when she has to go.  Musculoskeletal: Negative for myalgias.  All other systems reviewed and are negative.      Objective:   Physical Exam  Constitutional: She is oriented to person, place, and time. She appears well-developed and well-nourished.  HENT:  Nose: Nose normal.  Mouth/Throat: Oropharynx is clear and moist.  Eyes: EOM are normal.  Neck: Trachea normal, normal range of motion and full passive range of motion without pain. Neck supple. No JVD present. Carotid bruit is not present. No thyromegaly present.  Cardiovascular: Normal rate, regular rhythm, normal heart sounds and intact distal pulses.  Exam reveals no gallop and no friction rub.   No murmur heard. Pulmonary/Chest: Effort normal and breath sounds normal.  Abdominal: Soft. Bowel sounds are normal. She exhibits no distension and no mass. There is no tenderness.  Musculoskeletal: Normal range of motion.  Lymphadenopathy:    She has no cervical adenopathy.  Neurological: She is alert and oriented to person, place, and time. She has normal reflexes.  Skin: Skin is warm and dry.  Psychiatric: She has a normal mood and affect. Her behavior is normal. Judgment and thought content normal.    BP 127/79 mmHg  Pulse 77  Temp(Src) 97.9 F (36.6 C) (Oral)  Ht 5' 3.75" (1.619 m)  Wt 201 lb (91.173 kg)  BMI 34.78 kg/m2            Assessment & Plan:   1. Type 2 diabetes mellitus without complication, without long-term current use of insulin (HCC) Continue carb counting - metFORMIN (GLUCOPHAGE) 1000 MG tablet; Take 1 tablet (1,000 mg total) by mouth 2 (two) times daily with a meal.  Dispense: 180 tablet; Refill: 1 - glimepiride (AMARYL) 4 MG tablet; 1 po Qam and 1/2 po In evening  Dispense: 135 tablet; Refill: 1 - POCT glycosylated hemoglobin (Hb A1C)  2. Hyperlipidemia with target LDL less than 100 Low fat diet - fenofibrate 160 MG tablet; Take 1 tablet (160 mg total) by  mouth daily.  Dispense: 90 tablet; Refill: 0 - pravastatin (PRAVACHOL) 40 MG tablet; Take 1 tablet (40 mg total) by mouth daily.  Dispense: 90 tablet; Refill: 1 - Lipid panel  3. Low serum vitamin D  4. BMI 33.0-33.9,adult Discussed diet and exercise for person with BMI >25 Will recheck weight in 3-6 months   5. Chronic gout without tophus, unspecified cause, unspecified site - allopurinol (ZYLOPRIM) 300 MG tablet; Take 1 tablet (300 mg total) by mouth daily.  Dispense: 90 tablet; Refill: 1  6. Essential hypertension Do not add salt to diet - lisinopril (PRINIVIL,ZESTRIL) 40 MG tablet; Take 1 tablet (40 mg total) by mouth daily.  Dispense: 90 tablet; Refill: 1 - amLODipine (NORVASC) 2.5 MG tablet; Take 1 tablet (2.5 mg total) by mouth daily.  Dispense: 90 tablet; Refill: 1 - CMP14+EGFR    Labs pending Health maintenance reviewed Diet and exercise encouraged Continue all meds Follow up  In 3 month   [place], FNP

## 2016-01-14 LAB — CMP14+EGFR
ALK PHOS: 54 IU/L (ref 39–117)
ALT: 16 IU/L (ref 0–32)
AST: 13 IU/L (ref 0–40)
Albumin/Globulin Ratio: 1.3 (ref 1.1–2.5)
Albumin: 4 g/dL (ref 3.5–4.8)
BUN/Creatinine Ratio: 25 (ref 11–26)
BUN: 21 mg/dL (ref 8–27)
Bilirubin Total: 0.2 mg/dL (ref 0.0–1.2)
CALCIUM: 9.2 mg/dL (ref 8.7–10.3)
CO2: 21 mmol/L (ref 18–29)
CREATININE: 0.85 mg/dL (ref 0.57–1.00)
Chloride: 105 mmol/L (ref 96–106)
GFR calc Af Amer: 80 mL/min/{1.73_m2} (ref >59)
GFR, EST NON AFRICAN AMERICAN: 69 mL/min/{1.73_m2} (ref >59)
GLOBULIN, TOTAL: 3 g/dL (ref 1.5–4.5)
GLUCOSE: 123 mg/dL — AB (ref 65–99)
Potassium: 4.4 mmol/L (ref 3.5–5.2)
Sodium: 143 mmol/L (ref 134–144)
Total Protein: 7 g/dL (ref 6.0–8.5)

## 2016-01-14 LAB — LIPID PANEL
CHOL/HDL RATIO: 4 ratio (ref 0.0–4.4)
CHOLESTEROL TOTAL: 155 mg/dL (ref 100–199)
HDL: 39 mg/dL — AB (ref >39)
LDL CALC: 87 mg/dL (ref 0–99)
TRIGLYCERIDES: 143 mg/dL (ref 0–149)
VLDL CHOLESTEROL CAL: 29 mg/dL (ref 5–40)

## 2016-02-03 ENCOUNTER — Telehealth: Payer: Self-pay | Admitting: Nurse Practitioner

## 2016-02-03 MED ORDER — FENOFIBRATE 160 MG PO TABS: 160.0000 mg | ORAL_TABLET | Freq: Every day | ORAL | Status: DC

## 2016-02-03 NOTE — Telephone Encounter (Signed)
Stp and advised new rx sent as requested.

## 2016-02-03 NOTE — Telephone Encounter (Signed)
Fenofibrate changed

## 2016-02-06 ENCOUNTER — Ambulatory Visit (INDEPENDENT_AMBULATORY_CARE_PROVIDER_SITE_OTHER): Payer: Medicare Other | Admitting: Family Medicine

## 2016-02-06 ENCOUNTER — Encounter: Payer: Self-pay | Admitting: Family Medicine

## 2016-02-06 VITALS — BP 122/73 | HR 87 | Temp 98.3°F | Ht 63.75 in | Wt 196.4 lb

## 2016-02-06 DIAGNOSIS — L03115 Cellulitis of right lower limb: Secondary | ICD-10-CM

## 2016-02-06 DIAGNOSIS — L409 Psoriasis, unspecified: Secondary | ICD-10-CM

## 2016-02-06 MED ORDER — CLOBETASOL PROPIONATE 0.05 % EX OINT: 1.0000 "application " | TOPICAL_OINTMENT | Freq: Two times a day (BID) | CUTANEOUS | Status: DC

## 2016-02-06 MED ORDER — DOXYCYCLINE HYCLATE 100 MG PO TABS: 100.0000 mg | ORAL_TABLET | Freq: Two times a day (BID) | ORAL | Status: DC

## 2016-02-06 NOTE — Progress Notes (Signed)
Subjective:  Patient ID: Katelyn Ballard, female    DOB: 1945/09/28  Age: 71 y.o. MRN: [id number]  CC: Foot Pain   HPI Katelyn Ballard presents for swelling in the right foot. Has had a peeling eruption on the sole not responding to fungal tx for about 8 years   History Katelyn Ballard has a past medical history of Hyperlipidemia; Hypertension; Diabetes mellitus without complication (HCC); Osteopenia; Ankle fracture, right; and Gout (2006).   She has past surgical history that includes Abdominal hysterectomy.   Her family history includes Dementia in her mother; Diabetes in her mother, sister, and sister; Heart disease in her father, mother, and sister; Kidney disease in her mother.She reports that she quit smoking about 23 years ago. She has never used smokeless tobacco. She reports that she does not drink alcohol or use illicit drugs.    ROS Review of Systems  Constitutional: Negative for fever.  HENT: Negative for congestion, rhinorrhea and sore throat.   Respiratory: Negative for cough and shortness of breath.   Cardiovascular: Negative for chest pain and palpitations.  Gastrointestinal: Negative for abdominal pain.  Musculoskeletal: Negative for myalgias and arthralgias.    Objective:  BP 122/73 mmHg  Pulse 87  Temp(Src) 98.3 F (36.8 C) (Oral)  Ht 5' 3.75" (1.619 m)  Wt 196 lb 6.4 oz (89.086 kg)  BMI 33.99 kg/m2  SpO2 96%  BP Readings from Last 3 Encounters:  02/06/16 122/73  01/13/16 127/79  10/04/15 136/84    Wt Readings from Last 3 Encounters:  02/06/16 196 lb 6.4 oz (89.086 kg)  01/13/16 201 lb (91.173 kg)  10/04/15 193 lb 6.4 oz (87.726 kg)     Physical Exam  Constitutional: She is oriented to person, place, and time. She appears well-developed and well-nourished. No distress.  HENT:  Head: Normocephalic and atraumatic.  Eyes: Conjunctivae are normal. Pupils are equal, round, and reactive to light.  Neck: Normal range of motion. Neck supple. No  thyromegaly present.  Cardiovascular: Normal rate, regular rhythm and normal heart sounds.   No murmur heard. Pulmonary/Chest: Effort normal and breath sounds normal. No respiratory distress. She has no wheezes. She has no rales.  Abdominal: Soft. Bowel sounds are normal. She exhibits no distension. There is no tenderness. Guarding: scaly eruption on sole of both feet. Right has level  1 decubitus with erythemaat arch 4 cm.  Musculoskeletal: Normal range of motion.  Lymphadenopathy:    She has no cervical adenopathy.  Neurological: She is alert and oriented to person, place, and time.  Skin: Skin is warm and dry. Rash noted.  Psychiatric: She has a normal mood and affect. Her behavior is normal. Judgment and thought content normal.     Lab Results  Component Value Date   GLUCOSE 123* 01/13/2016   CHOL 155 01/13/2016   TRIG 143 01/13/2016   HDL 39* 01/13/2016   LDLCALC 87 01/13/2016   ALT 16 01/13/2016   AST 13 01/13/2016   NA 143 01/13/2016   K 4.4 01/13/2016   CL 105 01/13/2016   CREATININE 0.85 01/13/2016   BUN 21 01/13/2016   CO2 21 01/13/2016   TSH 1.610 05/03/2014   HGBA1C 6.7 01/13/2016    No results found.  Assessment & Plan:   Katelyn Ballard was seen today for foot pain.  Diagnoses and all orders for this visit:  Psoriasis (a type of skin inflammation)  Cellulitis of foot, right  Other orders -     clobetasol ointment (TEMOVATE) 0.05 %; Apply  1 application topically 2 (two) times daily. -     doxycycline (VIBRA-TABS) 100 MG tablet; Take 1 tablet (100 mg total) by mouth 2 (two) times daily.    Apply eucerin twice daily between applications of the clobetasol cream.   I am having Katelyn Ballard start on clobetasol ointment and doxycycline. I am also having her maintain her calcium-vitamin D, ibuprofen, Vitamin D (Ergocalciferol), ONE TOUCH ULTRA TEST, lisinopril, metFORMIN, glimepiride, pravastatin, amLODipine, allopurinol, and fenofibrate.  Meds ordered this  encounter  Medications  . clobetasol ointment (TEMOVATE) 0.05 %    Sig: Apply 1 application topically 2 (two) times daily.    Dispense:  60 g    Refill:  1  . doxycycline (VIBRA-TABS) 100 MG tablet    Sig: Take 1 tablet (100 mg total) by mouth 2 (two) times daily.    Dispense:  20 tablet    Refill:  0     Follow-up: No Follow-up on file.  Mechele Claude, M.D.

## 2016-02-06 NOTE — Patient Instructions (Signed)
Apply eucerin twice daily between applications of the clobetasol cream.

## 2016-02-07 ENCOUNTER — Telehealth: Payer: Self-pay | Admitting: Family Medicine

## 2016-02-20 ENCOUNTER — Encounter: Payer: Self-pay | Admitting: Family Medicine

## 2016-02-20 ENCOUNTER — Ambulatory Visit (INDEPENDENT_AMBULATORY_CARE_PROVIDER_SITE_OTHER): Payer: Medicare Other | Admitting: Family Medicine

## 2016-02-20 VITALS — BP 152/82 | HR 88 | Temp 97.0°F | Ht 63.75 in | Wt 198.4 lb

## 2016-02-20 DIAGNOSIS — L409 Psoriasis, unspecified: Secondary | ICD-10-CM

## 2016-02-20 DIAGNOSIS — M1A9XX0 Chronic gout, unspecified, without tophus (tophi): Secondary | ICD-10-CM | POA: Diagnosis not present

## 2016-02-20 DIAGNOSIS — I159 Secondary hypertension, unspecified: Secondary | ICD-10-CM

## 2016-02-20 DIAGNOSIS — E1142 Type 2 diabetes mellitus with diabetic polyneuropathy: Secondary | ICD-10-CM | POA: Diagnosis not present

## 2016-02-20 DIAGNOSIS — M10071 Idiopathic gout, right ankle and foot: Secondary | ICD-10-CM

## 2016-02-20 MED ORDER — FLUCONAZOLE 100 MG PO TABS: 100.0000 mg | ORAL_TABLET | Freq: Every day | ORAL | Status: DC

## 2016-02-20 NOTE — Progress Notes (Signed)
Subjective:  Patient ID: Katelyn Ballard, female    DOB: [birth year]  Age: 71 y.o. MRN: [id number]  CC: Foot Pain   HPI Katelyn Ballard presents for recheck of foot lesion. The redness has gone down significantly and the itching is much better but both remain somewhat. She does not wear diabetic shoes. She tried some several years ago and didn't like them. However she is willing to try again.   History Katelyn Ballard has a past medical history of Hyperlipidemia; Hypertension; Diabetes mellitus without complication (HCC); Osteopenia; Ankle fracture, right; and Gout (2006).   She has past surgical history that includes Abdominal hysterectomy.   Her family history includes Dementia in her mother; Diabetes in her mother, sister, and sister; Heart disease in her father, mother, and sister; Kidney disease in her mother.She reports that she quit smoking about 23 years ago. She has never used smokeless tobacco. She reports that she does not drink alcohol or use illicit drugs.    ROS Review of Systems  Constitutional: Negative for fever.  HENT: Negative for congestion and sore throat.   Respiratory: Negative for cough and shortness of breath.   Cardiovascular: Negative for chest pain and palpitations.  Musculoskeletal: Negative for myalgias, joint swelling and arthralgias.    Objective:  BP 152/82 mmHg  Pulse 88  Temp(Src) 97 F (36.1 C) (Oral)  Ht 5' 3.75" (1.619 m)  Wt 198 lb 6.4 oz (89.994 kg)  BMI 34.33 kg/m2  SpO2 95%  BP Readings from Last 3 Encounters:  02/20/16 152/82  02/06/16 122/73  01/13/16 127/79    Wt Readings from Last 3 Encounters:  02/20/16 198 lb 6.4 oz (89.994 kg)  02/06/16 196 lb 6.4 oz (89.086 kg)  01/13/16 201 lb (91.173 kg)     Physical Exam  Constitutional: She is oriented to person, place, and time. She appears well-developed and well-nourished. No distress.  HENT:  Head: Normocephalic and atraumatic.  Eyes: Conjunctivae are normal. Pupils are equal,  round, and reactive to light.  Neck: Normal range of motion. Neck supple. No thyromegaly present.  Cardiovascular: Normal rate, regular rhythm and normal heart sounds.   No murmur heard. Pulmonary/Chest: Effort normal and breath sounds normal. No respiratory distress. She has no wheezes. She has no rales.  Abdominal: Soft. Bowel sounds are normal. She exhibits no distension. There is no tenderness.  Musculoskeletal: Normal range of motion.  Lymphadenopathy:    She has no cervical adenopathy.  Neurological: She is alert and oriented to person, place, and time.  Skin: Skin is warm and dry. Rash (through the arch of thright foot there is some mild areas of erythema. This is diminished fromm previous exam. Additionally, there is resolution of the scaling noted before. The foot has a full range of motion.) noted. There is erythema.  Psychiatric: She has a normal mood and affect. Her behavior is normal. Judgment and thought content normal.     Lab Results  Component Value Date   GLUCOSE 123* 01/13/2016   CHOL 155 01/13/2016   TRIG 143 01/13/2016   HDL 39* 01/13/2016   LDLCALC 87 01/13/2016   ALT 16 01/13/2016   AST 13 01/13/2016   NA 143 01/13/2016   K 4.4 01/13/2016   CL 105 01/13/2016   CREATININE 0.85 01/13/2016   BUN 21 01/13/2016   CO2 21 01/13/2016   TSH 1.610 05/03/2014   HGBA1C 6.7 01/13/2016    No results found.  Assessment & Plan:   Katelyn Ballard was seen today for  foot pain.  Diagnoses and all orders for this visit:  Acute idiopathic gout of right foot  Chronic gout without tophus, unspecified cause, unspecified site  Secondary hypertension, unspecified  Psoriasis (a type of skin inflammation)  Type 2 diabetes mellitus with diabetic polyneuropathy, without long-term current use of insulin (HCC) -     PR DIABETIC CUSTOM MOLDED SHOE -     DME Other see comment  Other orders -     fluconazole (DIFLUCAN) 100 MG tablet; Take 1 tablet (100 mg total) by mouth  daily.   Improving rash on foot from psoriasis. Still some secondary yeast   I have discontinued Katelyn Ballard's doxycycline. I am also having her start on fluconazole. Additionally, I am having her maintain her calcium-vitamin D, ibuprofen, Vitamin D (Ergocalciferol), ONE TOUCH ULTRA TEST, lisinopril, metFORMIN, glimepiride, pravastatin, amLODipine, allopurinol, fenofibrate, and clobetasol ointment.  Meds ordered this encounter  Medications  . fluconazole (DIFLUCAN) 100 MG tablet    Sig: Take 1 tablet (100 mg total) by mouth daily.    Dispense:  15 tablet    Refill:  0     Follow-up: Return in about 2 months (around 04/19/2016).  Mechele Claude, M.D.

## 2016-02-20 NOTE — Patient Instructions (Signed)
Decrease clobetasol to once a day

## 2016-03-26 ENCOUNTER — Other Ambulatory Visit: Payer: Self-pay | Admitting: Nurse Practitioner

## 2016-04-25 ENCOUNTER — Encounter: Payer: Self-pay | Admitting: Nurse Practitioner

## 2016-04-25 ENCOUNTER — Ambulatory Visit (INDEPENDENT_AMBULATORY_CARE_PROVIDER_SITE_OTHER): Payer: Medicare Other | Admitting: Nurse Practitioner

## 2016-04-25 VITALS — BP 127/79 | HR 80 | Temp 97.7°F | Ht 63.0 in | Wt 202.0 lb

## 2016-04-25 DIAGNOSIS — Z6833 Body mass index (BMI) 33.0-33.9, adult: Secondary | ICD-10-CM | POA: Diagnosis not present

## 2016-04-25 DIAGNOSIS — E785 Hyperlipidemia, unspecified: Secondary | ICD-10-CM

## 2016-04-25 DIAGNOSIS — Z1159 Encounter for screening for other viral diseases: Secondary | ICD-10-CM | POA: Diagnosis not present

## 2016-04-25 DIAGNOSIS — M1A9XX0 Chronic gout, unspecified, without tophus (tophi): Secondary | ICD-10-CM

## 2016-04-25 DIAGNOSIS — I159 Secondary hypertension, unspecified: Secondary | ICD-10-CM

## 2016-04-25 DIAGNOSIS — E1142 Type 2 diabetes mellitus with diabetic polyneuropathy: Secondary | ICD-10-CM | POA: Diagnosis not present

## 2016-04-25 DIAGNOSIS — Z1212 Encounter for screening for malignant neoplasm of rectum: Secondary | ICD-10-CM | POA: Diagnosis not present

## 2016-04-25 DIAGNOSIS — E559 Vitamin D deficiency, unspecified: Secondary | ICD-10-CM | POA: Diagnosis not present

## 2016-04-25 LAB — BAYER DCA HB A1C WAIVED: HB A1C: 6.7 % (ref <7.0)

## 2016-04-25 MED ORDER — ALLOPURINOL 300 MG PO TABS: 300.0000 mg | ORAL_TABLET | Freq: Every day | ORAL | Status: DC

## 2016-04-25 MED ORDER — LISINOPRIL 40 MG PO TABS: 40.0000 mg | ORAL_TABLET | Freq: Every day | ORAL | Status: DC

## 2016-04-25 MED ORDER — METFORMIN HCL 1000 MG PO TABS: 1000.0000 mg | ORAL_TABLET | Freq: Two times a day (BID) | ORAL | Status: DC

## 2016-04-25 MED ORDER — AMLODIPINE BESYLATE 2.5 MG PO TABS: 2.5000 mg | ORAL_TABLET | Freq: Every day | ORAL | Status: DC

## 2016-04-25 MED ORDER — GLIMEPIRIDE 4 MG PO TABS: ORAL_TABLET | ORAL | Status: DC

## 2016-04-25 MED ORDER — PRAVASTATIN SODIUM 40 MG PO TABS: 40.0000 mg | ORAL_TABLET | Freq: Every day | ORAL | Status: DC

## 2016-04-25 MED ORDER — FENOFIBRATE 160 MG PO TABS: 160.0000 mg | ORAL_TABLET | Freq: Every day | ORAL | Status: DC

## 2016-04-25 NOTE — Patient Instructions (Signed)
Health Maintenance, Female Adopting a healthy lifestyle and getting preventive care can go a long way to promote health and wellness. Talk with your health care provider about what schedule of regular examinations is right for you. This is a good chance for you to check in with your provider about disease prevention and staying healthy. In between checkups, there are plenty of things you can do on your own. Experts have done a lot of research about which lifestyle changes and preventive measures are most likely to keep you healthy. Ask your health care provider for more information. WEIGHT AND DIET  Eat a healthy diet  Be sure to include plenty of vegetables, fruits, low-fat dairy products, and lean protein.  Do not eat a lot of foods high in solid fats, added sugars, or salt.  Get regular exercise. This is one of the most important things you can do for your health.  Most adults should exercise for at least 150 minutes each week. The exercise should increase your heart rate and make you sweat (moderate-intensity exercise).  Most adults should also do strengthening exercises at least twice a week. This is in addition to the moderate-intensity exercise.  Maintain a healthy weight  Body mass index (BMI) is a measurement that can be used to identify possible weight problems. It estimates body fat based on height and weight. Your health care provider can help determine your BMI and help you achieve or maintain a healthy weight.  For females 20 years of age and older:   A BMI below 18.5 is considered underweight.  A BMI of 18.5 to 24.9 is normal.  A BMI of 25 to 29.9 is considered overweight.  A BMI of 30 and above is considered obese.  Watch levels of cholesterol and blood lipids  You should start having your blood tested for lipids and cholesterol at 72 years of age, then have this test every 5 years.  You may need to have your cholesterol levels checked more often if:  Your lipid  or cholesterol levels are high.  You are older than 72 years of age.  You are at high risk for heart disease.  CANCER SCREENING   Lung Cancer  Lung cancer screening is recommended for adults 55-80 years old who are at high risk for lung cancer because of a history of smoking.  A yearly low-dose CT scan of the lungs is recommended for people who:  Currently smoke.  Have quit within the past 15 years.  Have at least a 30-pack-year history of smoking. A pack year is smoking an average of one pack of cigarettes a day for 1 year.  Yearly screening should continue until it has been 15 years since you quit.  Yearly screening should stop if you develop a health problem that would prevent you from having lung cancer treatment.  Breast Cancer  Practice breast self-awareness. This means understanding how your breasts normally appear and feel.  It also means doing regular breast self-exams. Let your health care provider know about any changes, no matter how small.  If you are in your 20s or 30s, you should have a clinical breast exam (CBE) by a health care provider every 1-3 years as part of a regular health exam.  If you are 40 or older, have a CBE every year. Also consider having a breast X-ray (mammogram) every year.  If you have a family history of breast cancer, talk to your health care provider about genetic screening.  If you   are at high risk for breast cancer, talk to your health care provider about having an MRI and a mammogram every year.  Breast cancer gene (BRCA) assessment is recommended for women who have family members with BRCA-related cancers. BRCA-related cancers include:  Breast.  Ovarian.  Tubal.  Peritoneal cancers.  Results of the assessment will determine the need for genetic counseling and BRCA1 and BRCA2 testing. Cervical Cancer Your health care provider may recommend that you be screened regularly for cancer of the pelvic organs (ovaries, uterus, and  vagina). This screening involves a pelvic examination, including checking for microscopic changes to the surface of your cervix (Pap test). You may be encouraged to have this screening done every 3 years, beginning at age 21.  For women ages 30-65, health care providers may recommend pelvic exams and Pap testing every 3 years, or they may recommend the Pap and pelvic exam, combined with testing for human papilloma virus (HPV), every 5 years. Some types of HPV increase your risk of cervical cancer. Testing for HPV may also be done on women of any age with unclear Pap test results.  Other health care providers may not recommend any screening for nonpregnant women who are considered low risk for pelvic cancer and who do not have symptoms. Ask your health care provider if a screening pelvic exam is right for you.  If you have had past treatment for cervical cancer or a condition that could lead to cancer, you need Pap tests and screening for cancer for at least 20 years after your treatment. If Pap tests have been discontinued, your risk factors (such as having a new sexual partner) need to be reassessed to determine if screening should resume. Some women have medical problems that increase the chance of getting cervical cancer. In these cases, your health care provider may recommend more frequent screening and Pap tests. Colorectal Cancer  This type of cancer can be detected and often prevented.  Routine colorectal cancer screening usually begins at 72 years of age and continues through 72 years of age.  Your health care provider may recommend screening at an earlier age if you have risk factors for colon cancer.  Your health care provider may also recommend using home test kits to check for hidden blood in the stool.  A small camera at the end of a tube can be used to examine your colon directly (sigmoidoscopy or colonoscopy). This is done to check for the earliest forms of colorectal  cancer.  Routine screening usually begins at age 50.  Direct examination of the colon should be repeated every 5-10 years through 72 years of age. However, you may need to be screened more often if early forms of precancerous polyps or small growths are found. Skin Cancer  Check your skin from head to toe regularly.  Tell your health care provider about any new moles or changes in moles, especially if there is a change in a mole's shape or color.  Also tell your health care provider if you have a mole that is larger than the size of a pencil eraser.  Always use sunscreen. Apply sunscreen liberally and repeatedly throughout the day.  Protect yourself by wearing long sleeves, pants, a wide-brimmed hat, and sunglasses whenever you are outside. HEART DISEASE, DIABETES, AND HIGH BLOOD PRESSURE   High blood pressure causes heart disease and increases the risk of stroke. High blood pressure is more likely to develop in:  People who have blood pressure in the high end   of the normal range (130-139/85-89 mm Hg).  People who are overweight or obese.  People who are African American.  If you are 59-40 years of age, have your blood pressure checked every 3-5 years. If you are 53 years of age or older, have your blood pressure checked every year. You should have your blood pressure measured twice--once when you are at a hospital or clinic, and once when you are not at a hospital or clinic. Record the average of the two measurements. To check your blood pressure when you are not at a hospital or clinic, you can use:  An automated blood pressure machine at a pharmacy.  A home blood pressure monitor.  If you are between 41 years and 39 years old, ask your health care provider if you should take aspirin to prevent strokes.  Have regular diabetes screenings. This involves taking a blood sample to check your fasting blood sugar level.  If you are at a normal weight and have a low risk for diabetes,  have this test once every three years after 72 years of age.  If you are overweight and have a high risk for diabetes, consider being tested at a younger age or more often. PREVENTING INFECTION  Hepatitis B  If you have a higher risk for hepatitis B, you should be screened for this virus. You are considered at high risk for hepatitis B if:  You were born in a country where hepatitis B is common. Ask your health care provider which countries are considered high risk.  Your parents were born in a high-risk country, and you have not been immunized against hepatitis B (hepatitis B vaccine).  You have HIV or AIDS.  You use needles to inject street drugs.  You live with someone who has hepatitis B.  You have had sex with someone who has hepatitis B.  You get hemodialysis treatment.  You take certain medicines for conditions, including cancer, organ transplantation, and autoimmune conditions. Hepatitis C  Blood testing is recommended for:  Everyone born from [birth year] through 1966.  Anyone with known risk factors for hepatitis C. Sexually transmitted infections (STIs)  You should be screened for sexually transmitted infections (STIs) including gonorrhea and chlamydia if:  You are sexually active and are younger than 72 years of age.  You are older than 72 years of age and your health care provider tells you that you are at risk for this type of infection.  Your sexual activity has changed since you were last screened and you are at an increased risk for chlamydia or gonorrhea. Ask your health care provider if you are at risk.  If you do not have HIV, but are at risk, it may be recommended that you take a prescription medicine daily to prevent HIV infection. This is called pre-exposure prophylaxis (PrEP). You are considered at risk if:  You are sexually active and do not regularly use condoms or know the HIV status of your partner(s).  You take drugs by injection.  You are sexually  active with a partner who has HIV. Talk with your health care provider about whether you are at high risk of being infected with HIV. If you choose to begin PrEP, you should first be tested for HIV. You should then be tested every 3 months for as long as you are taking PrEP.  PREGNANCY   If you are premenopausal and you may become pregnant, ask your health care provider about preconception counseling.  If you may  become pregnant, take 400 to 800 micrograms (mcg) of folic acid every day.  If you want to prevent pregnancy, talk to your health care provider about birth control (contraception). OSTEOPOROSIS AND MENOPAUSE   Osteoporosis is a disease in which the bones lose minerals and strength with aging. This can result in serious bone fractures. Your risk for osteoporosis can be identified using a bone density scan.  If you are 61 years of age or older, or if you are at risk for osteoporosis and fractures, ask your health care provider if you should be screened.  Ask your health care provider whether you should take a calcium or vitamin D supplement to lower your risk for osteoporosis.  Menopause may have certain physical symptoms and risks.  Hormone replacement therapy may reduce some of these symptoms and risks. Talk to your health care provider about whether hormone replacement therapy is right for you.  HOME CARE INSTRUCTIONS   Schedule regular health, dental, and eye exams.  Stay current with your immunizations.   Do not use any tobacco products including cigarettes, chewing tobacco, or electronic cigarettes.  If you are pregnant, do not drink alcohol.  If you are breastfeeding, limit how much and how often you drink alcohol.  Limit alcohol intake to no more than 1 drink per day for nonpregnant women. One drink equals 12 ounces of beer, 5 ounces of wine, or 1 ounces of hard liquor.  Do not use street drugs.  Do not share needles.  Ask your health care provider for help if  you need support or information about quitting drugs.  Tell your health care provider if you often feel depressed.  Tell your health care provider if you have ever been abused or do not feel safe at home.   This information is not intended to replace advice given to you by your health care provider. Make sure you discuss any questions you have with your health care provider.   Document Released: 07/02/2011 Document Revised: 01/07/2015 Document Reviewed: 11/18/2013 Elsevier Interactive Patient Education [employer].

## 2016-04-25 NOTE — Progress Notes (Signed)
Subjective:    Patient ID: Katelyn Ballard, female    DOB: 08/08/1945, 71 y.o.   MRN: [id number]  Patient here today for follow up- no complaints and no changes since last visit.  Outpatient Encounter Prescriptions as of 04/25/2016  Medication Sig  . allopurinol (ZYLOPRIM) 300 MG tablet Take 1 tablet (300 mg total) by mouth daily.  Marland Kitchen amLODipine (NORVASC) 2.5 MG tablet Take 1 tablet (2.5 mg total) by mouth daily.  . calcium-vitamin D (OSCAL WITH D) 500-200 MG-UNIT per tablet Take 1 tablet by mouth 2 (two) times daily.  . clobetasol ointment (TEMOVATE) 2.45 % Apply 1 application topically 2 (two) times daily.  . fenofibrate 160 MG tablet Take 1 tablet (160 mg total) by mouth daily.  . fluconazole (DIFLUCAN) 100 MG tablet Take 1 tablet (100 mg total) by mouth daily.  Marland Kitchen glimepiride (AMARYL) 4 MG tablet 1 po Qam and 1/2 po In evening  . ibuprofen (ADVIL,MOTRIN) 600 MG tablet TAKE ONE TABLET BY MOUTH EVERY 6 HOURS AS NEEDED  . lisinopril (PRINIVIL,ZESTRIL) 40 MG tablet Take 1 tablet (40 mg total) by mouth daily.  . metFORMIN (GLUCOPHAGE) 1000 MG tablet Take 1 tablet (1,000 mg total) by mouth 2 (two) times daily with a meal.  . ONE TOUCH ULTRA TEST test strip USE ONE STRIP TO CHECK GLUCOSE 2 TO 3 TIMES DAILY  . pravastatin (PRAVACHOL) 40 MG tablet Take 1 tablet (40 mg total) by mouth daily.  . Vitamin D, Ergocalciferol, (DRISDOL) 50000 units CAPS capsule TAKE ONE CAPSULE BY MOUTH ONCE A WEEK   No facility-administered encounter medications on file as of 04/25/2016.     Hyperlipidemia This is a chronic problem. The problem is controlled. Recent lipid tests were reviewed and are normal. Exacerbating diseases include diabetes and obesity. She has no history of hypothyroidism. Pertinent negatives include no chest pain, myalgias or shortness of breath. Current antihyperlipidemic treatment includes statins and fibric acid derivatives. The current treatment provides moderate improvement of lipids.  Compliance problems include adherence to diet and adherence to exercise.  Risk factors for coronary artery disease include diabetes mellitus, dyslipidemia, hypertension, obesity and post-menopausal.  Hypertension This is a chronic problem. The current episode started more than 1 year ago. The problem is unchanged. The problem is controlled. Pertinent negatives include no chest pain, palpitations, peripheral edema or shortness of breath. Risk factors for coronary artery disease include diabetes mellitus, dyslipidemia, family history, obesity and post-menopausal state. Past treatments include calcium channel blockers and ACE inhibitors. The current treatment provides moderate improvement. Compliance problems include diet and exercise.   Diabetes She presents for her follow-up diabetic visit. She has type 2 diabetes mellitus. No MedicAlert identification noted. Pertinent negatives for diabetes include no chest pain, no polydipsia, no polyphagia, no polyuria and no visual change. Risk factors for coronary artery disease include diabetes mellitus, dyslipidemia, family history, obesity, post-menopausal and hypertension. Current diabetic treatment includes oral agent (monotherapy). Her weight is stable. When asked about meal planning, she reported none. She never participates in exercise. Her breakfast blood glucose is taken between 8-9 am. Her breakfast blood glucose range is generally 140-180 mg/dl. Her highest blood glucose is >200 mg/dl. Her overall blood glucose range is 140-180 mg/dl. An ACE inhibitor/angiotensin II receptor blocker is being taken. She does not see a podiatrist.Eye exam is not current.  GOUT Taking allopurinol daily- no recent flareups  Low vitamin d On vit d 50000iu weekly- no side effects    Review of Systems  Constitutional: Negative.  HENT: Negative.   Respiratory: Negative for shortness of breath.   Cardiovascular: Negative for chest pain and palpitations.  Endocrine: Negative  for polydipsia, polyphagia and polyuria.  Genitourinary:       Occasional urinary incontinence - only occurs when sheis not near a restroom when she has to go.  Musculoskeletal: Negative for myalgias.  All other systems reviewed and are negative.      Objective:   Physical Exam  Constitutional: She is oriented to person, place, and time. She appears well-developed and well-nourished.  HENT:  Nose: Nose normal.  Mouth/Throat: Oropharynx is clear and moist.  Eyes: EOM are normal.  Neck: Trachea normal, normal range of motion and full passive range of motion without pain. Neck supple. No JVD present. Carotid bruit is not present. No thyromegaly present.  Cardiovascular: Normal rate, regular rhythm, normal heart sounds and intact distal pulses.  Exam reveals no gallop and no friction rub.   No murmur heard. Pulmonary/Chest: Effort normal and breath sounds normal.  Abdominal: Soft. Bowel sounds are normal. She exhibits no distension and no mass. There is no tenderness.  Musculoskeletal: Normal range of motion.  Lymphadenopathy:    She has no cervical adenopathy.  Neurological: She is alert and oriented to person, place, and time. She has normal reflexes.  Skin: Skin is warm and dry.  Psychiatric: She has a normal mood and affect. Her behavior is normal. Judgment and thought content normal.   BP 127/79 mmHg  Pulse 80  Temp(Src) 97.7 F (36.5 C) (Oral)  Ht 5' 3"  (1.6 m)  Wt 202 lb (91.627 kg)  BMI 35.79 kg/m2   hgba1c 6.7%       Assessment & Plan:   1. Secondary hypertension, unspecified Do not add salt to diet - CMP14+EGFR - amLODipine (NORVASC) 2.5 MG tablet; Take 1 tablet (2.5 mg total) by mouth daily.  Dispense: 90 tablet; Refill: 1 - lisinopril (PRINIVIL,ZESTRIL) 40 MG tablet; Take 1 tablet (40 mg total) by mouth daily.  Dispense: 90 tablet; Refill: 1  2. Type 2 diabetes mellitus with diabetic polyneuropathy, without long-term current use of insulin (HCC) Continue to  watch carbs in diet - Bayer DCA Hb A1c Waived - Microalbumin / creatinine urine ratio - metFORMIN (GLUCOPHAGE) 1000 MG tablet; Take 1 tablet (1,000 mg total) by mouth 2 (two) times daily with a meal.  Dispense: 180 tablet; Refill: 1 - glimepiride (AMARYL) 4 MG tablet; 1 po Qam and 1/2 po In evening  Dispense: 135 tablet; Refill: 1   3. Hyperlipidemia with target LDL less than 100 Low fta diet - Lipid panel - pravastatin (PRAVACHOL) 40 MG tablet; Take 1 tablet (40 mg total) by mouth daily.  Dispense: 90 tablet; Refill: 1 - fenofibrate 160 MG tablet; Take 1 tablet (160 mg total) by mouth daily.  Dispense: 30 tablet; Refill: 5  4. Chronic gout without tophus, unspecified cause, unspecified site - allopurinol (ZYLOPRIM) 300 MG tablet; Take 1 tablet (300 mg total) by mouth daily.  Dispense: 90 tablet; Refill: 1  5. Vitamin D deficiency  6. BMI 33.0-33.9,adult Discussed diet and exercise for person with BMI >25 Will recheck weight in 3-6 months  7. Screening for malignant neoplasm of the rectum - Fecal occult blood, imunochemical; Future  8. Need for hepatitis C screening test - Hepatitis C antibody     Labs pending Health maintenance reviewed Diet and exercise encouraged Continue all meds Follow up  In 3 month   [place], FNP

## 2016-04-26 LAB — CMP14+EGFR
A/G RATIO: 1.5 (ref 1.2–2.2)
ALBUMIN: 4.2 g/dL (ref 3.5–4.8)
ALT: 17 IU/L (ref 0–32)
AST: 10 IU/L (ref 0–40)
Alkaline Phosphatase: 55 IU/L (ref 39–117)
BILIRUBIN TOTAL: 0.3 mg/dL (ref 0.0–1.2)
BUN / CREAT RATIO: 26 (ref 12–28)
BUN: 23 mg/dL (ref 8–27)
CHLORIDE: 103 mmol/L (ref 96–106)
CO2: 23 mmol/L (ref 18–29)
Calcium: 9.6 mg/dL (ref 8.7–10.3)
Creatinine, Ser: 0.89 mg/dL (ref 0.57–1.00)
GFR calc non Af Amer: 65 mL/min/{1.73_m2} (ref >59)
GFR, EST AFRICAN AMERICAN: 75 mL/min/{1.73_m2} (ref >59)
Globulin, Total: 2.8 g/dL (ref 1.5–4.5)
Glucose: 183 mg/dL — ABNORMAL HIGH (ref 65–99)
POTASSIUM: 4.5 mmol/L (ref 3.5–5.2)
Sodium: 143 mmol/L (ref 134–144)
TOTAL PROTEIN: 7 g/dL (ref 6.0–8.5)

## 2016-04-26 LAB — LIPID PANEL
Chol/HDL Ratio: 4.3 ratio units (ref 0.0–4.4)
Cholesterol, Total: 164 mg/dL (ref 100–199)
HDL: 38 mg/dL — ABNORMAL LOW (ref >39)
LDL Calculated: 92 mg/dL (ref 0–99)
Triglycerides: 168 mg/dL — ABNORMAL HIGH (ref 0–149)
VLDL Cholesterol Cal: 34 mg/dL (ref 5–40)

## 2016-04-26 LAB — MICROALBUMIN / CREATININE URINE RATIO
Creatinine, Urine: 93.6 mg/dL
MICROALB/CREAT RATIO: 9.1 mg/g{creat} (ref 0.0–30.0)
Microalbumin, Urine: 8.5 ug/mL

## 2016-04-26 LAB — HEPATITIS C ANTIBODY: Hep C Virus Ab: <0.1 s/co ratio (ref 0.0–0.9)

## 2016-05-03 ENCOUNTER — Other Ambulatory Visit: Payer: Self-pay | Admitting: Nurse Practitioner

## 2016-05-07 ENCOUNTER — Encounter: Payer: Self-pay | Admitting: Family Medicine

## 2016-05-07 ENCOUNTER — Ambulatory Visit (INDEPENDENT_AMBULATORY_CARE_PROVIDER_SITE_OTHER): Payer: Medicare Other | Admitting: Family Medicine

## 2016-05-07 VITALS — BP 134/79 | HR 86 | Temp 97.7°F | Ht 63.0 in | Wt 204.0 lb

## 2016-05-07 DIAGNOSIS — M7631 Iliotibial band syndrome, right leg: Secondary | ICD-10-CM

## 2016-05-07 MED ORDER — MELOXICAM 15 MG PO TABS: 15.0000 mg | ORAL_TABLET | Freq: Every day | ORAL | Status: DC

## 2016-05-07 NOTE — Progress Notes (Signed)
BP 134/79 mmHg  Pulse 86  Temp(Src) 97.7 F (36.5 C) (Oral)  Ht 5' 3"  (1.6 m)  Wt 204 lb (92.534 kg)  BMI 36.15 kg/m2   Subjective:    Patient ID: Katelyn Ballard, female    DOB: 1945/11/08, 71 y.o.   MRN: [id number]  HPI: Katelyn Ballard is a 71 y.o. female presenting on 05/07/2016 for Pain and numbness in right foot and leg and Right hip pain   HPI Right leg pain and some numbness shooting down into her lower leg. Patient comes in because she's been having right leg pain that can worsen over the past 10 days starting from her lateral hip and then extended down into her knee and over the past couple days she started having some tingling and burning going down in the lateral aspect of her lower leg as well. She denies any known trauma that she's done specifically and she does admit that it hurts a lot more when she sleeps on that side. She denies any fevers or chills or overlying skin changes. There is no swelling in the area that she can note over her skin. She has been the little more unsteady with her gait while this is been going on.  Relevant past medical, surgical, family and social history reviewed and updated as indicated. Interim medical history since our last visit reviewed. Allergies and medications reviewed and updated.  Review of Systems  Constitutional: Negative for fever and chills.  HENT: Negative for congestion, ear discharge and ear pain.   Eyes: Negative for redness and visual disturbance.  Respiratory: Negative for chest tightness and shortness of breath.   Cardiovascular: Negative for chest pain and leg swelling.  Genitourinary: Negative for dysuria and difficulty urinating.  Musculoskeletal: Positive for myalgias, arthralgias and gait problem. Negative for back pain.  Skin: Negative for rash.  Neurological: Negative for light-headedness and headaches.  Psychiatric/Behavioral: Negative for behavioral problems and agitation.  All other systems reviewed and are  negative.   Per HPI unless specifically indicated above     Medication List       This list is accurate as of: 05/07/16 11:38 AM.  Always use your most recent med list.               allopurinol 300 MG tablet  Commonly known as:  ZYLOPRIM  Take 1 tablet (300 mg total) by mouth daily.     amLODipine 2.5 MG tablet  Commonly known as:  NORVASC  Take 1 tablet (2.5 mg total) by mouth daily.     calcium-vitamin D 500-200 MG-UNIT tablet  Commonly known as:  OSCAL WITH D  Take 1 tablet by mouth 2 (two) times daily.     fenofibrate 160 MG tablet  Take 1 tablet (160 mg total) by mouth daily.     glimepiride 4 MG tablet  Commonly known as:  AMARYL  1 po Qam and 1/2 po In evening     lisinopril 40 MG tablet  Commonly known as:  PRINIVIL,ZESTRIL  Take 1 tablet (40 mg total) by mouth daily.     meloxicam 15 MG tablet  Commonly known as:  MOBIC  Take 1 tablet (15 mg total) by mouth daily.     metFORMIN 1000 MG tablet  Commonly known as:  GLUCOPHAGE  Take 1 tablet (1,000 mg total) by mouth 2 (two) times daily with a meal.     ONE TOUCH ULTRA TEST test strip  Generic drug:  glucose blood  USE TO CHECK GLUCOSE TWICE TO THREE TIMES DAILY     pravastatin 40 MG tablet  Commonly known as:  PRAVACHOL  Take 1 tablet (40 mg total) by mouth daily.     Vitamin D (Ergocalciferol) 50000 units Caps capsule  Commonly known as:  DRISDOL  TAKE ONE CAPSULE BY MOUTH ONCE A WEEK           Objective:    BP 134/79 mmHg  Pulse 86  Temp(Src) 97.7 F (36.5 C) (Oral)  Ht 5' 3"  (1.6 m)  Wt 204 lb (92.534 kg)  BMI 36.15 kg/m2  Wt Readings from Last 3 Encounters:  05/07/16 204 lb (92.534 kg)  04/25/16 202 lb (91.627 kg)  02/20/16 198 lb 6.4 oz (89.994 kg)    Physical Exam  Constitutional: She is oriented to person, place, and time. She appears well-developed and well-nourished. No distress.  Eyes: Conjunctivae and EOM are normal. Pupils are equal, round, and reactive to light.    Cardiovascular: Normal rate, regular rhythm, normal heart sounds and intact distal pulses.   No murmur heard. Pulmonary/Chest: Effort normal and breath sounds normal. No respiratory distress. She has no wheezes.  Musculoskeletal: Normal range of motion. She exhibits no edema.       Right hip: She exhibits tenderness (Tenderness laterally that extends down her IT band.). She exhibits normal range of motion, normal strength, no bony tenderness and no swelling.  Neurological: She is alert and oriented to person, place, and time. She has normal strength. Gait (Limping gait) abnormal. Coordination normal.  Tingling sensation and numbness on lateral lower leg likely from compression of the superficial peroneal nerves fibers that innervate that skin as they passed under the IT band  Skin: Skin is warm and dry. No rash noted. She is not diaphoretic.  Psychiatric: She has a normal mood and affect. Her behavior is normal.  Nursing note and vitals reviewed.   Results for orders placed or performed in visit on 04/25/16  Bayer DCA Hb A1c Waived  Result Value Ref Range   Bayer DCA Hb A1c Waived 6.7 <7.0 %  CMP14+EGFR  Result Value Ref Range   Glucose 183 (H) 65 - 99 mg/dL   BUN 23 8 - 27 mg/dL   Creatinine, Ser 0.89 0.57 - 1.00 mg/dL   GFR calc non Af Amer 65 >59 mL/min/1.73   GFR calc Af Amer 75 >59 mL/min/1.73   BUN/Creatinine Ratio 26 12 - 28   Sodium 143 134 - 144 mmol/L   Potassium 4.5 3.5 - 5.2 mmol/L   Chloride 103 96 - 106 mmol/L   CO2 23 18 - 29 mmol/L   Calcium 9.6 8.7 - 10.3 mg/dL   Total Protein 7.0 6.0 - 8.5 g/dL   Albumin 4.2 3.5 - 4.8 g/dL   Globulin, Total 2.8 1.5 - 4.5 g/dL   Albumin/Globulin Ratio 1.5 1.2 - 2.2   Bilirubin Total 0.3 0.0 - 1.2 mg/dL   Alkaline Phosphatase 55 39 - 117 IU/L   AST 10 0 - 40 IU/L   ALT 17 0 - 32 IU/L  Lipid panel  Result Value Ref Range   Cholesterol, Total 164 100 - 199 mg/dL   Triglycerides 168 (H) 0 - 149 mg/dL   HDL 38 (L) >39 mg/dL    VLDL Cholesterol Cal 34 5 - 40 mg/dL   LDL Calculated 92 0 - 99 mg/dL   Chol/HDL Ratio 4.3 0.0 - 4.4 ratio units  Microalbumin / creatinine urine ratio  Result Value  Ref Range   Creatinine, Urine 93.6 Not Estab. mg/dL   Microalbum.,U,Random 8.5 Not Estab. ug/mL   MICROALB/CREAT RATIO 9.1 0.0 - 30.0 mg/g creat  Hepatitis C antibody  Result Value Ref Range   Hep C Virus Ab <0.1 0.0 - 0.9 s/co ratio      Assessment & Plan:   Problem List Items Addressed This Visit    None    Visit Diagnoses    IT band syndrome, right    -  Primary    Don't use Mobic with Aleve or Profen, may use Tylenol, may need prednisone and pt in future, do stretches and exercises    Relevant Medications    meloxicam (MOBIC) 15 MG tablet        Follow up plan: Return if symptoms worsen or fail to improve.  Counseling provided for all of the vaccine components No orders of the defined types were placed in this encounter.    Caryl Pina, MD [facility] 05/07/2016, 11:38 AM

## 2016-07-14 ENCOUNTER — Other Ambulatory Visit: Payer: Self-pay | Admitting: Nurse Practitioner

## 2016-07-26 ENCOUNTER — Ambulatory Visit (INDEPENDENT_AMBULATORY_CARE_PROVIDER_SITE_OTHER): Payer: Medicare Other | Admitting: Nurse Practitioner

## 2016-07-26 ENCOUNTER — Encounter: Payer: Self-pay | Admitting: Nurse Practitioner

## 2016-07-26 VITALS — BP 121/75 | HR 72 | Temp 97.8°F | Ht 63.0 in | Wt 194.0 lb

## 2016-07-26 DIAGNOSIS — Z6833 Body mass index (BMI) 33.0-33.9, adult: Secondary | ICD-10-CM | POA: Diagnosis not present

## 2016-07-26 DIAGNOSIS — I10 Essential (primary) hypertension: Secondary | ICD-10-CM | POA: Diagnosis not present

## 2016-07-26 DIAGNOSIS — E785 Hyperlipidemia, unspecified: Secondary | ICD-10-CM

## 2016-07-26 DIAGNOSIS — E559 Vitamin D deficiency, unspecified: Secondary | ICD-10-CM

## 2016-07-26 DIAGNOSIS — E1142 Type 2 diabetes mellitus with diabetic polyneuropathy: Secondary | ICD-10-CM

## 2016-07-26 LAB — BAYER DCA HB A1C WAIVED: HB A1C: 6.4 % (ref <7.0)

## 2016-07-26 NOTE — Patient Instructions (Signed)

## 2016-07-26 NOTE — Progress Notes (Addendum)
Subjective:    Patient ID: Katelyn Ballard, female    DOB: [birth year], 71 y.o.   MRN: [id number]  Patient here today for follow up- no complaints and no changes since last visit.  Outpatient Encounter Prescriptions as of 07/26/2016  Medication Sig  . allopurinol (ZYLOPRIM) 300 MG tablet Take 1 tablet (300 mg total) by mouth daily.  Marland Kitchen amLODipine (NORVASC) 2.5 MG tablet Take 1 tablet (2.5 mg total) by mouth daily.  . calcium-vitamin D (OSCAL WITH D) 500-200 MG-UNIT per tablet Take 1 tablet by mouth 2 (two) times daily.  . fenofibrate 160 MG tablet Take 1 tablet (160 mg total) by mouth daily.  Marland Kitchen glimepiride (AMARYL) 4 MG tablet 1 po Qam and 1/2 po In evening  . lisinopril (PRINIVIL,ZESTRIL) 40 MG tablet Take 1 tablet (40 mg total) by mouth daily.  . meloxicam (MOBIC) 15 MG tablet Take 1 tablet (15 mg total) by mouth daily.  . metFORMIN (GLUCOPHAGE) 1000 MG tablet Take 1 tablet (1,000 mg total) by mouth 2 (two) times daily with a meal.  . ONE TOUCH ULTRA TEST test strip USE TO CHECK GLUCOSE TWICE TO THREE TIMES DAILY  . pravastatin (PRAVACHOL) 40 MG tablet Take 1 tablet (40 mg total) by mouth daily.  . Vitamin D, Ergocalciferol, (DRISDOL) 50000 units CAPS capsule TAKE ONE CAPSULE BY MOUTH ONCE A WEEK   No facility-administered encounter medications on file as of 07/26/2016.      Hyperlipidemia  This is a chronic problem. The problem is controlled. Recent lipid tests were reviewed and are normal. Exacerbating diseases include diabetes and obesity. She has no history of hypothyroidism. Pertinent negatives include no chest pain, myalgias or shortness of breath. Current antihyperlipidemic treatment includes statins and fibric acid derivatives. The current treatment provides moderate improvement of lipids. Compliance problems include adherence to diet and adherence to exercise.  Risk factors for coronary artery disease include diabetes mellitus, dyslipidemia, hypertension, obesity and post-menopausal.   Hypertension  This is a chronic problem. The current episode started more than 1 year ago. The problem is unchanged. The problem is controlled. Pertinent negatives include no chest pain, palpitations, peripheral edema or shortness of breath. Risk factors for coronary artery disease include diabetes mellitus, dyslipidemia, family history, obesity and post-menopausal state. Past treatments include calcium channel blockers and ACE inhibitors. The current treatment provides moderate improvement. Compliance problems include diet and exercise.   Diabetes  She presents for her follow-up diabetic visit. She has type 2 diabetes mellitus. No MedicAlert identification noted. Pertinent negatives for diabetes include no chest pain, no polydipsia, no polyphagia, no polyuria and no visual change. Risk factors for coronary artery disease include diabetes mellitus, dyslipidemia, family history, obesity, post-menopausal and hypertension. Current diabetic treatment includes oral agent (monotherapy). Her weight is stable. When asked about meal planning, she reported none. She never participates in exercise. Home blood sugar record trend: patient does not check =blood sugars everyday. Her breakfast blood glucose is taken between 8-9 am. Her breakfast blood glucose range is generally 130-140 mg/dl. Her highest blood glucose is >200 mg/dl. Her overall blood glucose range is 130-140 mg/dl. An ACE inhibitor/angiotensin II receptor blocker is being taken. She does not see a podiatrist.Eye exam is not current.  GOUT Taking allopurinol daily- no recent flareups  Low vitamin d On vit d 50000iu weekly- no side effects    Review of Systems  Constitutional: Negative.   HENT: Negative.   Respiratory: Negative for shortness of breath.   Cardiovascular: Negative for chest pain  and palpitations.  Endocrine: Negative for polydipsia, polyphagia and polyuria.  Genitourinary:       Occasional urinary incontinence - only occurs when  sheis not near a restroom when she has to go.  Musculoskeletal: Negative for myalgias.  All other systems reviewed and are negative.      Objective:   Physical Exam  Constitutional: She is oriented to person, place, and time. She appears well-developed and well-nourished.  HENT:  Nose: Nose normal.  Mouth/Throat: Oropharynx is clear and moist.  Eyes: EOM are normal.  Neck: Trachea normal, normal range of motion and full passive range of motion without pain. Neck supple. No JVD present. Carotid bruit is not present. No thyromegaly present.  Cardiovascular: Normal rate, regular rhythm, normal heart sounds and intact distal pulses.  Exam reveals no gallop and no friction rub.   No murmur heard. Pulmonary/Chest: Effort normal and breath sounds normal.  Abdominal: Soft. Bowel sounds are normal. She exhibits no distension and no mass. There is no tenderness.  Musculoskeletal: Normal range of motion.  Lymphadenopathy:    She has no cervical adenopathy.  Neurological: She is alert and oriented to person, place, and time. She has normal reflexes.  Skin: Skin is warm and dry.  Psychiatric: She has a normal mood and affect. Her behavior is normal. Judgment and thought content normal.   BP 121/75 (BP Location: Left Arm, Patient Position: Sitting, Cuff Size: Normal)   Pulse 72   Temp 97.8 F (36.6 C) (Oral)   Ht 5' 3" (1.6 m)   Wt 194 lb (88 kg)   BMI 34.37 kg/m    hgba1c 6.4% down from 6.7% at last visit       Assessment & Plan:  1. Type 2 diabetes mellitus with diabetic polyneuropathy, without long-term current use of insulin (HCC) Stricter carb counting Keep diary of blood sugars Patient wants to work on it herself - Bayer DCA Hb A1c Waived  2. Hyperlipidemia with target LDL less than 100 Low fta diet - Lipid panel  3. Essential hypertension Do not add salt to diet - CMP14+EGFR  4. Vitamin D deficiency  5. BMI 33.0-33.9,adult Discussed diet and exercise for person with  BMI >25 Will recheck weight in 3-6 months    Labs pending Health maintenance reviewed Diet and exercise encouraged Continue all meds Follow up  In 3 months   [place], FNP

## 2016-07-27 LAB — LIPID PANEL
CHOLESTEROL TOTAL: 129 mg/dL (ref 100–199)
Chol/HDL Ratio: 3.5 ratio units (ref 0.0–4.4)
HDL: 37 mg/dL — ABNORMAL LOW (ref >39)
LDL Calculated: 65 mg/dL (ref 0–99)
TRIGLYCERIDES: 133 mg/dL (ref 0–149)
VLDL Cholesterol Cal: 27 mg/dL (ref 5–40)

## 2016-07-27 LAB — CMP14+EGFR
ALBUMIN: 3.8 g/dL (ref 3.5–4.8)
ALK PHOS: 84 IU/L (ref 39–117)
ALT: 15 IU/L (ref 0–32)
AST: 7 IU/L (ref 0–40)
Albumin/Globulin Ratio: 1.2 (ref 1.2–2.2)
BUN / CREAT RATIO: 29 — AB (ref 12–28)
BUN: 26 mg/dL (ref 8–27)
Bilirubin Total: 0.2 mg/dL (ref 0.0–1.2)
CALCIUM: 9.7 mg/dL (ref 8.7–10.3)
CO2: 18 mmol/L (ref 18–29)
CREATININE: 0.9 mg/dL (ref 0.57–1.00)
Chloride: 103 mmol/L (ref 96–106)
GFR, EST AFRICAN AMERICAN: 74 mL/min/{1.73_m2} (ref >59)
GFR, EST NON AFRICAN AMERICAN: 64 mL/min/{1.73_m2} (ref >59)
GLUCOSE: 141 mg/dL — AB (ref 65–99)
Globulin, Total: 3.2 g/dL (ref 1.5–4.5)
Potassium: 4.9 mmol/L (ref 3.5–5.2)
SODIUM: 141 mmol/L (ref 134–144)
Total Protein: 7 g/dL (ref 6.0–8.5)

## 2016-08-06 ENCOUNTER — Telehealth: Payer: Self-pay | Admitting: Nurse Practitioner

## 2016-08-06 NOTE — Progress Notes (Signed)
Patient aware.

## 2016-10-06 ENCOUNTER — Other Ambulatory Visit: Payer: Self-pay | Admitting: Nurse Practitioner

## 2016-10-10 ENCOUNTER — Other Ambulatory Visit: Payer: Self-pay | Admitting: Nurse Practitioner

## 2016-10-11 ENCOUNTER — Other Ambulatory Visit: Payer: Self-pay | Admitting: Nurse Practitioner

## 2016-11-02 ENCOUNTER — Encounter: Payer: Self-pay | Admitting: Nurse Practitioner

## 2016-11-02 ENCOUNTER — Ambulatory Visit (INDEPENDENT_AMBULATORY_CARE_PROVIDER_SITE_OTHER): Payer: Medicare Other | Admitting: Nurse Practitioner

## 2016-11-02 VITALS — BP 144/84 | HR 67 | Temp 98.3°F | Ht 63.0 in | Wt 203.0 lb

## 2016-11-02 DIAGNOSIS — G629 Polyneuropathy, unspecified: Secondary | ICD-10-CM | POA: Diagnosis not present

## 2016-11-02 DIAGNOSIS — Z6833 Body mass index (BMI) 33.0-33.9, adult: Secondary | ICD-10-CM

## 2016-11-02 DIAGNOSIS — I10 Essential (primary) hypertension: Secondary | ICD-10-CM

## 2016-11-02 DIAGNOSIS — M1A00X0 Idiopathic chronic gout, unspecified site, without tophus (tophi): Secondary | ICD-10-CM | POA: Diagnosis not present

## 2016-11-02 DIAGNOSIS — E1142 Type 2 diabetes mellitus with diabetic polyneuropathy: Secondary | ICD-10-CM | POA: Diagnosis not present

## 2016-11-02 DIAGNOSIS — E559 Vitamin D deficiency, unspecified: Secondary | ICD-10-CM

## 2016-11-02 DIAGNOSIS — M1A9XX0 Chronic gout, unspecified, without tophus (tophi): Secondary | ICD-10-CM

## 2016-11-02 DIAGNOSIS — E785 Hyperlipidemia, unspecified: Secondary | ICD-10-CM | POA: Diagnosis not present

## 2016-11-02 LAB — CMP14+EGFR
ALT: 18 IU/L (ref 0–32)
AST: 7 IU/L (ref 0–40)
Albumin/Globulin Ratio: 1.2 (ref 1.2–2.2)
Albumin: 3.8 g/dL (ref 3.5–4.8)
Alkaline Phosphatase: 84 IU/L (ref 39–117)
BUN/Creatinine Ratio: 22 (ref 12–28)
BUN: 16 mg/dL (ref 8–27)
Bilirubin Total: 0.3 mg/dL (ref 0.0–1.2)
CALCIUM: 9.4 mg/dL (ref 8.7–10.3)
CO2: 23 mmol/L (ref 18–29)
CREATININE: 0.72 mg/dL (ref 0.57–1.00)
Chloride: 103 mmol/L (ref 96–106)
GFR, EST AFRICAN AMERICAN: 97 mL/min/{1.73_m2} (ref >59)
GFR, EST NON AFRICAN AMERICAN: 84 mL/min/{1.73_m2} (ref >59)
GLUCOSE: 170 mg/dL — AB (ref 65–99)
Globulin, Total: 3.1 g/dL (ref 1.5–4.5)
Potassium: 4.3 mmol/L (ref 3.5–5.2)
Sodium: 143 mmol/L (ref 134–144)
TOTAL PROTEIN: 6.9 g/dL (ref 6.0–8.5)

## 2016-11-02 LAB — LIPID PANEL
CHOL/HDL RATIO: 5.5 ratio — AB (ref 0.0–4.4)
Cholesterol, Total: 193 mg/dL (ref 100–199)
HDL: 35 mg/dL — AB (ref >39)
LDL Calculated: 98 mg/dL (ref 0–99)
Triglycerides: 301 mg/dL — ABNORMAL HIGH (ref 0–149)
VLDL CHOLESTEROL CAL: 60 mg/dL — AB (ref 5–40)

## 2016-11-02 LAB — BAYER DCA HB A1C WAIVED: HB A1C: 6.5 % (ref <7.0)

## 2016-11-02 MED ORDER — LISINOPRIL 40 MG PO TABS: 40.0000 mg | ORAL_TABLET | Freq: Every day | ORAL | 1 refills | Status: DC

## 2016-11-02 MED ORDER — METFORMIN HCL 1000 MG PO TABS: 1000.0000 mg | ORAL_TABLET | Freq: Two times a day (BID) | ORAL | 1 refills | Status: DC

## 2016-11-02 MED ORDER — PRAVASTATIN SODIUM 40 MG PO TABS: 40.0000 mg | ORAL_TABLET | Freq: Every day | ORAL | 1 refills | Status: DC

## 2016-11-02 MED ORDER — ALLOPURINOL 300 MG PO TABS: 300.0000 mg | ORAL_TABLET | Freq: Every day | ORAL | 1 refills | Status: DC

## 2016-11-02 MED ORDER — AMLODIPINE BESYLATE 2.5 MG PO TABS: 2.5000 mg | ORAL_TABLET | Freq: Every day | ORAL | 1 refills | Status: DC

## 2016-11-02 MED ORDER — GLIMEPIRIDE 4 MG PO TABS: ORAL_TABLET | ORAL | 1 refills | Status: DC

## 2016-11-02 MED ORDER — FENOFIBRATE 160 MG PO TABS: 160.0000 mg | ORAL_TABLET | Freq: Every day | ORAL | 5 refills | Status: DC

## 2016-11-02 NOTE — Progress Notes (Signed)
Subjective:    Patient ID: Katelyn Ballard, female    DOB: 20-Feb-1945, 71 y.o.   MRN: [id number]  Patient here today for follow up- no complaints and no changes since last visit. No complaints today  Outpatient Encounter Prescriptions as of 11/02/2016  Medication Sig  . allopurinol (ZYLOPRIM) 300 MG tablet Take 1 tablet (300 mg total) by mouth daily.  Marland Kitchen amLODipine (NORVASC) 2.5 MG tablet Take 1 tablet (2.5 mg total) by mouth daily.  . calcium-vitamin D (OSCAL WITH D) 500-200 MG-UNIT per tablet Take 1 tablet by mouth 2 (two) times daily.  . fenofibrate 160 MG tablet Take 1 tablet (160 mg total) by mouth daily.  Marland Kitchen glimepiride (AMARYL) 4 MG tablet 1 po Qam and 1/2 po In evening  . lisinopril (PRINIVIL,ZESTRIL) 40 MG tablet Take 1 tablet (40 mg total) by mouth daily.  . meloxicam (MOBIC) 15 MG tablet Take 1 tablet (15 mg total) by mouth daily.  . metFORMIN (GLUCOPHAGE) 1000 MG tablet Take 1 tablet (1,000 mg total) by mouth 2 (two) times daily with a meal.  . ONE TOUCH ULTRA TEST test strip USE TO CHECK GLUCOSE TWICE TO THREE TIMES DAILY  . pravastatin (PRAVACHOL) 40 MG tablet Take 1 tablet (40 mg total) by mouth daily.  . Vitamin D, Ergocalciferol, (DRISDOL) 50000 units CAPS capsule TAKE ONE CAPSULE BY MOUTH ONCE A WEEK   No facility-administered encounter medications on file as of 11/02/2016.      Hyperlipidemia  This is a chronic problem. The problem is controlled. Recent lipid tests were reviewed and are normal. Exacerbating diseases include diabetes and obesity. She has no history of hypothyroidism. Pertinent negatives include no chest pain, myalgias or shortness of breath. Current antihyperlipidemic treatment includes statins and fibric acid derivatives. The current treatment provides moderate improvement of lipids. Compliance problems include adherence to diet and adherence to exercise.  Risk factors for coronary artery disease include diabetes mellitus, dyslipidemia, hypertension, obesity  and post-menopausal.  Hypertension  This is a chronic problem. The current episode started more than 1 year ago. The problem is unchanged. The problem is controlled. Pertinent negatives include no chest pain, palpitations, peripheral edema or shortness of breath. Risk factors for coronary artery disease include diabetes mellitus, dyslipidemia, family history, obesity and post-menopausal state. Past treatments include calcium channel blockers and ACE inhibitors. The current treatment provides moderate improvement. Compliance problems include diet and exercise.   Diabetes  She presents for her follow-up diabetic visit. She has type 2 diabetes mellitus. No MedicAlert identification noted. Pertinent negatives for diabetes include no chest pain, no polydipsia, no polyphagia, no polyuria and no visual change. Risk factors for coronary artery disease include diabetes mellitus, dyslipidemia, family history, obesity, post-menopausal and hypertension. Current diabetic treatment includes oral agent (monotherapy). Her weight is stable. When asked about meal planning, she reported none. She never participates in exercise. Home blood sugar record trend: patient has been checking blood sugars everyday and they seem to be running a little higher lately. Her breakfast blood glucose is taken between 8-9 am. Her breakfast blood glucose range is generally 140-180 mg/dl. Her highest blood glucose is >200 mg/dl. Her overall blood glucose range is 140-180 mg/dl. An ACE inhibitor/angiotensin II receptor blocker is being taken. She does not see a podiatrist.Eye exam is not current.  GOUT Taking allopurinol daily- no recent flareups  Low vitamin d On vit d 50000iu weekly- no side effects  *Left leg tingling and feels numb at times- goes the the way down to  foot.   Review of Systems  Constitutional: Negative.   HENT: Negative.   Respiratory: Negative for shortness of breath.   Cardiovascular: Negative for chest pain and  palpitations.  Endocrine: Negative for polydipsia, polyphagia and polyuria.  Genitourinary:       Occasional urinary incontinence - only occurs when sheis not near a restroom when she has to go.  Musculoskeletal: Negative for myalgias.  All other systems reviewed and are negative.      Objective:   Physical Exam  Constitutional: She is oriented to person, place, and time. She appears well-developed and well-nourished.  HENT:  Nose: Nose normal.  Mouth/Throat: Oropharynx is clear and moist.  Eyes: EOM are normal.  Neck: Trachea normal, normal range of motion and full passive range of motion without pain. Neck supple. No JVD present. Carotid bruit is not present. No thyromegaly present.  Cardiovascular: Normal rate, regular rhythm, normal heart sounds and intact distal pulses.  Exam reveals no gallop and no friction rub.   No murmur heard. Pulmonary/Chest: Effort normal and breath sounds normal.  Abdominal: Soft. Bowel sounds are normal. She exhibits no distension and no mass. There is no tenderness.  Musculoskeletal: Normal range of motion.  Lymphadenopathy:    She has no cervical adenopathy.  Neurological: She is alert and oriented to person, place, and time. She has normal reflexes.  Skin: Skin is warm and dry.  Psychiatric: She has a normal mood and affect. Her behavior is normal. Judgment and thought content normal.   BP (!) 144/84 (BP Location: Left Arm, Cuff Size: Normal)   Pulse 67   Temp 98.3 F (36.8 C) (Oral)   Ht 5' 3"  (1.6 m)   Wt 203 lb (92.1 kg)   BMI 35.96 kg/m   hgba1c  6.5%     Assessment & Plan:  1. Type 2 diabetes mellitus with diabetic polyneuropathy, without long-term current use of insulin (HCC) Continue to watch carbs in diet - Bayer DCA Hb A1c Waived - lisinopril (PRINIVIL,ZESTRIL) 40 MG tablet; Take 1 tablet (40 mg total) by mouth daily.  Dispense: 90 tablet; Refill: 1 - amLODipine (NORVASC) 2.5 MG tablet; Take 1 tablet (2.5 mg total) by mouth  daily.  Dispense: 90 tablet; Refill: 1 - metFORMIN (GLUCOPHAGE) 1000 MG tablet; Take 1 tablet (1,000 mg total) by mouth 2 (two) times daily with a meal.  Dispense: 180 tablet; Refill: 1 - glimepiride (AMARYL) 4 MG tablet; 1 po Qam and 1/2 po In evening  Dispense: 135 tablet; Refill: 1  2. Hyperlipidemia with target LDL less than 100 Low fat diet - Lipid panel - pravastatin (PRAVACHOL) 40 MG tablet; Take 1 tablet (40 mg total) by mouth daily.  Dispense: 90 tablet; Refill: 1 - fenofibrate 160 MG tablet; Take 1 tablet (160 mg total) by mouth daily.  Dispense: 30 tablet; Refill: 5  3. Essential hypertension Low sodium diet.mmmb - CMP14+EGFR  4. BMI 33.0-33.9,adult    5. Chronic gout without tophus, unspecified cause, unspecified site - allopurinol (ZYLOPRIM) 300 MG tablet; Take 1 tablet (300 mg total) by mouth daily.  Dispense: 90 tablet; Refill: 1  6. Vitamin D deficiency   7. Neuropathy [facility]) Patient does not want o take anything at this time. - She will let me know- suggested gabapentin    Labs pending Health maintenance reviewed Diet and exercise encouraged Continue all meds Follow up  In 3 month  [place], FNP

## 2016-11-02 NOTE — Patient Instructions (Signed)
Diabetes and Foot Care Diabetes may cause you to have problems because of poor blood supply (circulation) to your feet and legs. This may cause the skin on your feet to become thinner, break easier, and heal more slowly. Your skin may become dry, and the skin may peel and crack. You may also have nerve damage in your legs and feet causing decreased feeling in them. You may not notice minor injuries to your feet that could lead to infections or more serious problems. Taking care of your feet is one of the most important things you can do for yourself.  HOME CARE INSTRUCTIONS  Wear shoes at all times, even in the house. Do not go barefoot. Bare feet are easily injured.  Check your feet daily for blisters, cuts, and redness. If you cannot see the bottom of your feet, use a mirror or ask someone for help.  Wash your feet with warm water (do not use hot water) and mild soap. Then pat your feet and the areas between your toes until they are completely dry. Do not soak your feet as this can dry your skin.  Apply a moisturizing lotion or petroleum jelly (that does not contain alcohol and is unscented) to the skin on your feet and to dry, brittle toenails. Do not apply lotion between your toes.  Trim your toenails straight across. Do not dig under them or around the cuticle. File the edges of your nails with an emery board or nail file.  Do not cut corns or calluses or try to remove them with medicine.  Wear clean socks or stockings every day. Make sure they are not too tight. Do not wear knee-high stockings since they may decrease blood flow to your legs.  Wear shoes that fit properly and have enough cushioning. To break in new shoes, wear them for just a few hours a day. This prevents you from injuring your feet. Always look in your shoes before you put them on to be sure there are no objects inside.  Do not cross your legs. This may decrease the blood flow to your feet.  If you find a minor scrape,  cut, or break in the skin on your feet, keep it and the skin around it clean and dry. These areas may be cleansed with mild soap and water. Do not cleanse the area with peroxide, alcohol, or iodine.  When you remove an adhesive bandage, be sure not to damage the skin around it.  If you have a wound, look at it several times a day to make sure it is healing.  Do not use heating pads or hot water bottles. They may burn your skin. If you have lost feeling in your feet or legs, you may not know it is happening until it is too late.  Make sure your health care provider performs a complete foot exam at least annually or more often if you have foot problems. Report any cuts, sores, or bruises to your health care provider immediately. SEEK MEDICAL CARE IF:   You have an injury that is not healing.  You have cuts or breaks in the skin.  You have an ingrown nail.  You notice redness on your legs or feet.  You feel burning or tingling in your legs or feet.  You have pain or cramps in your legs and feet.  Your legs or feet are numb.  Your feet always feel cold. SEEK IMMEDIATE MEDICAL CARE IF:   There is increasing redness,   swelling, or pain in or around a wound.  There is a red line that goes up your leg.  Pus is coming from a wound.  You develop a fever or as directed by your health care provider.  You notice a bad smell coming from an ulcer or wound.   This information is not intended to replace advice given to you by your health care provider. Make sure you discuss any questions you have with your health care provider.   Document Released: 12/14/2000 Document Revised: 08/19/2013 Document Reviewed: 05/26/2013 Elsevier Interactive Patient Education 2016 [employer]  

## 2017-01-04 ENCOUNTER — Telehealth: Payer: Self-pay

## 2017-01-04 NOTE — Telephone Encounter (Signed)
-----   Message from Mary-Margaret Martin, FNP sent at 01/04/2017 11:02 AM EST ----- Remind patient to please do hemoccult cards given at appointment  

## 2017-01-04 NOTE — Telephone Encounter (Signed)
Patient states she was not given a FOBT and states she will get one at follow up with MMM In February.

## 2017-01-23 ENCOUNTER — Other Ambulatory Visit: Payer: Self-pay | Admitting: Nurse Practitioner

## 2017-02-07 ENCOUNTER — Encounter: Payer: Self-pay | Admitting: Nurse Practitioner

## 2017-02-07 ENCOUNTER — Ambulatory Visit (INDEPENDENT_AMBULATORY_CARE_PROVIDER_SITE_OTHER): Payer: Medicare Other | Admitting: Nurse Practitioner

## 2017-02-07 VITALS — BP 152/86 | HR 74 | Temp 97.5°F | Ht 63.0 in | Wt 211.0 lb

## 2017-02-07 DIAGNOSIS — I10 Essential (primary) hypertension: Secondary | ICD-10-CM

## 2017-02-07 DIAGNOSIS — M79605 Pain in left leg: Secondary | ICD-10-CM

## 2017-02-07 DIAGNOSIS — E1142 Type 2 diabetes mellitus with diabetic polyneuropathy: Secondary | ICD-10-CM | POA: Diagnosis not present

## 2017-02-07 DIAGNOSIS — Z1211 Encounter for screening for malignant neoplasm of colon: Secondary | ICD-10-CM | POA: Diagnosis not present

## 2017-02-07 DIAGNOSIS — E785 Hyperlipidemia, unspecified: Secondary | ICD-10-CM

## 2017-02-07 DIAGNOSIS — M1A9XX0 Chronic gout, unspecified, without tophus (tophi): Secondary | ICD-10-CM

## 2017-02-07 LAB — BAYER DCA HB A1C WAIVED: HB A1C: 7.1 % — AB (ref <7.0)

## 2017-02-07 MED ORDER — AMLODIPINE BESYLATE 2.5 MG PO TABS: 2.5000 mg | ORAL_TABLET | Freq: Every day | ORAL | 1 refills | Status: DC

## 2017-02-07 MED ORDER — ALLOPURINOL 300 MG PO TABS: 300.0000 mg | ORAL_TABLET | Freq: Every day | ORAL | 1 refills | Status: DC

## 2017-02-07 MED ORDER — LISINOPRIL 40 MG PO TABS: 40.0000 mg | ORAL_TABLET | Freq: Every day | ORAL | 1 refills | Status: DC

## 2017-02-07 MED ORDER — PRAVASTATIN SODIUM 40 MG PO TABS: 40.0000 mg | ORAL_TABLET | Freq: Every day | ORAL | 1 refills | Status: DC

## 2017-02-07 MED ORDER — GLIMEPIRIDE 4 MG PO TABS: ORAL_TABLET | ORAL | 1 refills | Status: DC

## 2017-02-07 MED ORDER — MELOXICAM 15 MG PO TABS: 15.0000 mg | ORAL_TABLET | Freq: Every day | ORAL | 0 refills | Status: DC

## 2017-02-07 MED ORDER — FENOFIBRATE 160 MG PO TABS: 160.0000 mg | ORAL_TABLET | Freq: Every day | ORAL | 5 refills | Status: DC

## 2017-02-07 MED ORDER — METFORMIN HCL 1000 MG PO TABS: 1000.0000 mg | ORAL_TABLET | Freq: Two times a day (BID) | ORAL | 1 refills | Status: DC

## 2017-02-07 NOTE — Progress Notes (Addendum)
Subjective:    Patient ID: Katelyn Ballard, female    DOB: 03-20-1945, 72 y.o.   MRN: [id number]  Patient here today for follow up- no complaints and no changes since last visit. No complaints today  Outpatient Encounter Prescriptions as of 02/07/2017  Medication Sig  . allopurinol (ZYLOPRIM) 300 MG tablet Take 1 tablet (300 mg total) by mouth daily.  Marland Kitchen amLODipine (NORVASC) 2.5 MG tablet Take 1 tablet (2.5 mg total) by mouth daily.  . calcium-vitamin D (OSCAL WITH D) 500-200 MG-UNIT per tablet Take 1 tablet by mouth 2 (two) times daily.  . fenofibrate 160 MG tablet Take 1 tablet (160 mg total) by mouth daily.  Marland Kitchen glimepiride (AMARYL) 4 MG tablet 1 po Qam and 1/2 po In evening  . lisinopril (PRINIVIL,ZESTRIL) 40 MG tablet Take 1 tablet (40 mg total) by mouth daily.  . meloxicam (MOBIC) 15 MG tablet Take 1 tablet (15 mg total) by mouth daily.  . metFORMIN (GLUCOPHAGE) 1000 MG tablet Take 1 tablet (1,000 mg total) by mouth 2 (two) times daily with a meal.  . ONE TOUCH ULTRA TEST test strip USE TO CHECK GLUCOSE TWICE TO THREE TIMES DAILY  . pravastatin (PRAVACHOL) 40 MG tablet Take 1 tablet (40 mg total) by mouth daily.  . Vitamin D, Ergocalciferol, (DRISDOL) 50000 units CAPS capsule TAKE ONE CAPSULE BY MOUTH ONCE A WEEK   No facility-administered encounter medications on file as of 02/07/2017.      Hyperlipidemia  This is a chronic problem. The problem is controlled. Recent lipid tests were reviewed and are normal. Exacerbating diseases include diabetes and obesity. She has no history of hypothyroidism. Pertinent negatives include no chest pain, myalgias or shortness of breath. Current antihyperlipidemic treatment includes statins and fibric acid derivatives. The current treatment provides moderate improvement of lipids. Compliance problems include adherence to diet and adherence to exercise.  Risk factors for coronary artery disease include diabetes mellitus, dyslipidemia, hypertension, obesity  and post-menopausal.  Hypertension  This is a chronic problem. The current episode started more than 1 year ago. The problem is unchanged. The problem is controlled. Pertinent negatives include no chest pain, palpitations, peripheral edema or shortness of breath. Risk factors for coronary artery disease include diabetes mellitus, dyslipidemia, family history, obesity and post-menopausal state. Past treatments include calcium channel blockers and ACE inhibitors. The current treatment provides moderate improvement. Compliance problems include diet and exercise.   Diabetes  She presents for her follow-up diabetic visit. She has type 2 diabetes mellitus. No MedicAlert identification noted. Pertinent negatives for diabetes include no chest pain, no polydipsia, no polyphagia, no polyuria and no visual change. Risk factors for coronary artery disease include diabetes mellitus, dyslipidemia, family history, obesity, post-menopausal and hypertension. Current diabetic treatment includes oral agent (monotherapy). Her weight is stable. When asked about meal planning, she reported none. She never participates in exercise. Home blood sugar record trend: patient has been checking blood sugars everyday and they seem to be running a little higher lately. Her breakfast blood glucose is taken between 8-9 am. Her breakfast blood glucose range is generally 180-200 mg/dl. Her highest blood glucose is >200 mg/dl. Her overall blood glucose range is 180-200 mg/dl. An ACE inhibitor/angiotensin II receptor blocker is being taken. She does not see a podiatrist.Eye exam is not current.  GOUT Taking allopurinol daily- no recent flareups  Low vitamin d On vit d 50000iu weekly- no side effects  *Left leg tingling and feels numb at times- goes the the way down to  foot. Seems to be getting worse.   Review of Systems  Constitutional: Negative.   HENT: Negative.   Respiratory: Negative for shortness of breath.   Cardiovascular:  Negative for chest pain and palpitations.  Endocrine: Negative for polydipsia, polyphagia and polyuria.  Genitourinary:       Occasional urinary incontinence - only occurs when sheis not near a restroom when she has to go.  Musculoskeletal: Negative for myalgias.  All other systems reviewed and are negative.      Objective:   Physical Exam  Constitutional: She is oriented to person, place, and time. She appears well-developed and well-nourished.  HENT:  Nose: Nose normal.  Mouth/Throat: Oropharynx is clear and moist.  Eyes: EOM are normal.  Neck: Trachea normal, normal range of motion and full passive range of motion without pain. Neck supple. No JVD present. Carotid bruit is not present. No thyromegaly present.  Cardiovascular: Normal rate, regular rhythm, normal heart sounds and intact distal pulses.  Exam reveals no gallop and no friction rub.   No murmur heard. Pulmonary/Chest: Effort normal and breath sounds normal.  Abdominal: Soft. Bowel sounds are normal. She exhibits no distension and no mass. There is no tenderness.  Large nontender umbilical hernia   Musculoskeletal: Normal range of motion.  Lymphadenopathy:    She has no cervical adenopathy.  Neurological: She is alert and oriented to person, place, and time. She has normal reflexes.  Skin: Skin is warm and dry.  Psychiatric: She has a normal mood and affect. Her behavior is normal. Judgment and thought content normal.   BP (!) 152/86   Pulse 74   Temp 97.5 F (36.4 C) (Oral)   Ht _0  (1.6 m)   Wt 211 lb (95.7 kg)   BMI 37.38 kg/m   hgba1c  7.1% which is up from 6.5% at last visit     Assessment & Plan:  1. Type 2 diabetes mellitus with diabetic polyneuropathy, without long-term current use of insulin ([place]) Get back to watching carbs in diet - Bayer DCA Hb A1c Waived - lisinopril (PRINIVIL,ZESTRIL) 40 MG tablet; Take 1 tablet (40 mg total) by mouth daily.  Dispense: 90 tablet; Refill: 1 - amLODipine  (NORVASC) 2.5 MG tablet; Take 1 tablet (2.5 mg total) by mouth daily.  Dispense: 90 tablet; Refill: 1 - metFORMIN (GLUCOPHAGE) 1000 MG tablet; Take 1 tablet (1,000 mg total) by mouth 2 (two) times daily with a meal.  Dispense: 180 tablet; Refill: 1 - glimepiride (AMARYL) 4 MG tablet; 1 po Qam and 1/2 po In evening  Dispense: 135 tablet; Refill: 1  2. Hyperlipidemia with target LDL less than 100 Low fat diet - Lipid panel - pravastatin (PRAVACHOL) 40 MG tablet; Take 1 tablet (40 mg total) by mouth daily.  Dispense: 90 tablet; Refill: 1 - fenofibrate 160 MG tablet; Take 1 tablet (160 mg total) by mouth daily.  Dispense: 30 tablet; Refill: 5  3. Essential hypertension Low sodium diet - CMP14+EGFR  4. Chronic gout without tophus, unspecified cause, unspecified site - allopurinol (ZYLOPRIM) 300 MG tablet; Take 1 tablet (300 mg total) by mouth daily.  Dispense: 90 tablet; Refill: 1  5. Left leg pain - meloxicam (MOBIC) 15 MG tablet; Take 1 tablet (15 mg total) by mouth daily.  Dispense: 20 tablet; Refill: 0   Patient will make appointment for eye eam Labs pending Health maintenance reviewed Diet and exercise encouraged Continue all meds Follow up  In 3 months   [place], FNP

## 2017-02-07 NOTE — Patient Instructions (Signed)
Carbohydrate Counting for Diabetes Mellitus, Adult Carbohydrate counting is a method for keeping track of how many carbohydrates you eat. Eating carbohydrates naturally increases the amount of sugar (glucose) in the blood. Counting how many carbohydrates you eat helps keep your blood glucose within normal limits, which helps you manage your diabetes (diabetes mellitus). It is important to know how many carbohydrates you can safely have in each meal. This is different for every person. A diet and nutrition specialist (registered dietitian) can help you make a meal plan and calculate how many carbohydrates you should have at each meal and snack. Carbohydrates are found in the following foods:  Grains, such as breads and cereals.  Dried beans and soy products.  Starchy vegetables, such as potatoes, peas, and corn.  Fruit and fruit juices.  Milk and yogurt.  Sweets and snack foods, such as cake, cookies, candy, chips, and soft drinks. How do I count carbohydrates? There are two ways to count carbohydrates in food. You can use either of the methods or a combination of both. Reading "Nutrition Facts" on packaged food  The "Nutrition Facts" list is included on the labels of almost all packaged foods and beverages in the [place] It includes:  The serving size.  Information about nutrients in each serving, including the grams (g) of carbohydrate per serving. To use the "Nutrition Facts":  Decide how many servings you will have.  Multiply the number of servings by the number of carbohydrates per serving.  The resulting number is the total amount of carbohydrates that you will be having. Learning standard serving sizes of other foods  When you eat foods containing carbohydrates that are not packaged or do not include "Nutrition Facts" on the label, you need to measure the servings in order to count the amount of carbohydrates:  Measure the foods that you will eat with a food scale or measuring  cup, if needed.  Decide how many standard-size servings you will eat.  Multiply the number of servings by 15. Most carbohydrate-rich foods have about 15 g of carbohydrates per serving.  For example, if you eat 8 oz (170 g) of strawberries, you will have eaten 2 servings and 30 g of carbohydrates (2 servings x 15 g = 30 g).  For foods that have more than one food mixed, such as soups and casseroles, you must count the carbohydrates in each food that is included. The following list contains standard serving sizes of common carbohydrate-rich foods. Each of these servings has about 15 g of carbohydrates:   hamburger bun or  English muffin.   oz (15 mL) syrup.   oz (14 g) jelly.  1 slice of bread.  1 six-inch tortilla.  3 oz (85 g) cooked rice or pasta.  4 oz (113 g) cooked dried beans.  4 oz (113 g) starchy vegetable, such as peas, corn, or potatoes.  4 oz (113 g) hot cereal.  4 oz (113 g) mashed potatoes or  of a large baked potato.  4 oz (113 g) canned or frozen fruit.  4 oz (120 mL) fruit juice.  4-6 crackers.  6 chicken nuggets.  6 oz (170 g) unsweetened dry cereal.  6 oz (170 g) plain fat-free yogurt or yogurt sweetened with artificial sweeteners.  8 oz (240 mL) milk.  8 oz (170 g) fresh fruit or one small piece of fruit.  24 oz (680 g) popped popcorn. Example of carbohydrate counting Sample meal  3 oz (85 g) chicken breast.  6 oz (  170 g) brown rice.  4 oz (113 g) corn.  8 oz (240 mL) milk.  8 oz (170 g) strawberries with sugar-free whipped topping. Carbohydrate calculation 1. Identify the foods that contain carbohydrates:  Rice.  Corn.  Milk.  Strawberries. 2. Calculate how many servings you have of each food:  2 servings rice.  1 serving corn.  1 serving milk.  1 serving strawberries. 3. Multiply each number of servings by 15 g:  2 servings rice x 15 g = 30 g.  1 serving corn x 15 g = 15 g.  1 serving milk x 15 g = 15  g.  1 serving strawberries x 15 g = 15 g. 4. Add together all of the amounts to find the total grams of carbohydrates eaten:  30 g + 15 g + 15 g + 15 g = 75 g of carbohydrates total. This information is not intended to replace advice given to you by your health care provider. Make sure you discuss any questions you have with your health care provider. Document Released: 12/17/2005 Document Revised: 07/06/2016 Document Reviewed: 05/30/2016 Elsevier Interactive Patient Education  2017 Else[employer] Inc[employer]  

## 2017-02-08 LAB — LIPID PANEL
CHOL/HDL RATIO: 4.2 ratio (ref 0.0–4.4)
Cholesterol, Total: 156 mg/dL (ref 100–199)
HDL: 37 mg/dL — ABNORMAL LOW (ref >39)
LDL CALC: 84 mg/dL (ref 0–99)
Triglycerides: 175 mg/dL — ABNORMAL HIGH (ref 0–149)
VLDL CHOLESTEROL CAL: 35 mg/dL (ref 5–40)

## 2017-02-08 LAB — CMP14+EGFR
ALBUMIN: 4 g/dL (ref 3.5–4.8)
ALT: 18 IU/L (ref 0–32)
AST: 11 IU/L (ref 0–40)
Albumin/Globulin Ratio: 1.4 (ref 1.2–2.2)
Alkaline Phosphatase: 72 IU/L (ref 39–117)
BILIRUBIN TOTAL: 0.2 mg/dL (ref 0.0–1.2)
BUN / CREAT RATIO: 27 (ref 12–28)
BUN: 21 mg/dL (ref 8–27)
CO2: 23 mmol/L (ref 18–29)
CREATININE: 0.79 mg/dL (ref 0.57–1.00)
Calcium: 9 mg/dL (ref 8.7–10.3)
Chloride: 105 mmol/L (ref 96–106)
GFR calc non Af Amer: 75 mL/min/{1.73_m2} (ref >59)
GFR, EST AFRICAN AMERICAN: 86 mL/min/{1.73_m2} (ref >59)
GLOBULIN, TOTAL: 2.9 g/dL (ref 1.5–4.5)
GLUCOSE: 170 mg/dL — AB (ref 65–99)
Potassium: 4.2 mmol/L (ref 3.5–5.2)
SODIUM: 145 mmol/L — AB (ref 134–144)
TOTAL PROTEIN: 6.9 g/dL (ref 6.0–8.5)

## 2017-03-19 ENCOUNTER — Other Ambulatory Visit: Payer: Self-pay | Admitting: Nurse Practitioner

## 2017-03-19 DIAGNOSIS — M79605 Pain in left leg: Secondary | ICD-10-CM

## 2017-03-21 ENCOUNTER — Other Ambulatory Visit: Payer: Self-pay | Admitting: Nurse Practitioner

## 2017-03-21 DIAGNOSIS — M79605 Pain in left leg: Secondary | ICD-10-CM

## 2017-04-02 ENCOUNTER — Encounter: Payer: Self-pay | Admitting: Family Medicine

## 2017-04-02 ENCOUNTER — Other Ambulatory Visit: Payer: Self-pay

## 2017-04-02 ENCOUNTER — Ambulatory Visit (INDEPENDENT_AMBULATORY_CARE_PROVIDER_SITE_OTHER): Payer: Medicare Other | Admitting: Family Medicine

## 2017-04-02 VITALS — BP 158/76 | HR 72 | Temp 97.5°F | Ht 63.0 in | Wt 210.0 lb

## 2017-04-02 DIAGNOSIS — M5432 Sciatica, left side: Secondary | ICD-10-CM | POA: Diagnosis not present

## 2017-04-02 DIAGNOSIS — R262 Difficulty in walking, not elsewhere classified: Secondary | ICD-10-CM

## 2017-04-02 MED ORDER — PREDNISONE 10 MG PO TABS: ORAL_TABLET | ORAL | 0 refills | Status: DC

## 2017-04-02 NOTE — Progress Notes (Signed)
Subjective:  Patient ID: Katelyn Ballard, female    DOB: 07-31-1945  Age: 72 y.o. MRN: [id number]  CC: Leg Pain (pt here today c/o left leg pain )   HPI Katelyn Ballard presents for Left lower extremity pain going from the buttocks into the posterior and lateral thigh. She says it radiates into the calf all the way to the ankle. She knows of no injury. On a good day the pain is between 4 and 5 but on bad days he can be between 8 and 9. Been present for a couple of months.   History Katelyn Ballard has a past medical history of Ankle fracture, right; Diabetes mellitus without complication (HCC); Gout (2006); Hyperlipidemia; Hypertension; and Osteopenia.   She has a past surgical history that includes Abdominal hysterectomy.   Her family history includes Dementia in her mother; Diabetes in her mother, sister, and sister; Heart disease in her father, mother, and sister; Kidney disease in her mother.She reports that she quit smoking about 24 years ago. She has never used smokeless tobacco. She reports that she does not drink alcohol or use drugs.    ROS Review of Systems  Constitutional: Positive for activity change.  Musculoskeletal: Negative for back pain.  Psychiatric/Behavioral: Negative for agitation.    Objective:  BP (!) 158/76   Pulse 72   Temp 97.5 F (36.4 C) (Oral)   Ht  (1.6 m)   Wt 210 lb (95.3 kg)   BMI 37.20 kg/m   BP Readings from Last 3 Encounters:  04/02/17 (!) 158/76  02/07/17 (!) 152/86  11/02/16 (!) 144/84    Wt Readings from Last 3 Encounters:  04/02/17 210 lb (95.3 kg)  02/07/17 211 lb (95.7 kg)  11/02/16 203 lb (92.1 kg)     Physical Exam  Musculoskeletal: Normal range of motion. She exhibits tenderness (left sciatic notch).      Assessment & Plan:   Katelyn Ballard was seen today for leg pain.  Diagnoses and all orders for this visit:  Sciatica, left side  Other orders -     predniSONE (DELTASONE) 10 MG tablet; Take 5 daily for 3 days  followed by 4,3,2 and 1 for 3 days each.       I am having Katelyn Ballard start on predniSONE. I am also having her maintain her calcium-vitamin D, ONE TOUCH ULTRA TEST, Vitamin D (Ergocalciferol), allopurinol, pravastatin, fenofibrate, lisinopril, amLODipine, metFORMIN, glimepiride, and meloxicam.  Allergies as of 04/02/2017      Reactions   Atorvastatin Other (See Comments)   myalgias   Penicillins       Medication List       Accurate as of 04/02/17 10:21 PM. Always use your most recent med list.          allopurinol 300 MG tablet Commonly known as:  ZYLOPRIM Take 1 tablet (300 mg total) by mouth daily.   amLODipine 2.5 MG tablet Commonly known as:  NORVASC Take 1 tablet (2.5 mg total) by mouth daily.   calcium-vitamin D 500-200 MG-UNIT tablet Commonly known as:  OSCAL WITH D Take 1 tablet by mouth 2 (two) times daily.   fenofibrate 160 MG tablet Take 1 tablet (160 mg total) by mouth daily.   glimepiride 4 MG tablet Commonly known as:  AMARYL 1 po Qam and 1/2 po In evening   lisinopril 40 MG tablet Commonly known as:  PRINIVIL,ZESTRIL Take 1 tablet (40 mg total) by mouth daily.   meloxicam 15 MG tablet Commonly known  as:  MOBIC TAKE 1 TABLET BY MOUTH ONCE DAILY   metFORMIN 1000 MG tablet Commonly known as:  GLUCOPHAGE Take 1 tablet (1,000 mg total) by mouth 2 (two) times daily with a meal.   ONE TOUCH ULTRA TEST test strip Generic drug:  glucose blood USE TO CHECK GLUCOSE TWICE TO THREE TIMES DAILY   pravastatin 40 MG tablet Commonly known as:  PRAVACHOL Take 1 tablet (40 mg total) by mouth daily.   predniSONE 10 MG tablet Commonly known as:  DELTASONE Take 5 daily for 3 days followed by 4,3,2 and 1 for 3 days each.   Vitamin D (Ergocalciferol) 50000 units Caps capsule Commonly known as:  DRISDOL TAKE ONE CAPSULE BY MOUTH ONCE A WEEK        Follow-up: Return in about 2 weeks (around 04/16/2017), or with Paulene Floor, for diabetes, Pain.  Mechele Claude, M.D.

## 2017-04-24 ENCOUNTER — Other Ambulatory Visit: Payer: Self-pay | Admitting: Nurse Practitioner

## 2017-05-09 ENCOUNTER — Encounter: Payer: Self-pay | Admitting: Nurse Practitioner

## 2017-05-09 ENCOUNTER — Ambulatory Visit (INDEPENDENT_AMBULATORY_CARE_PROVIDER_SITE_OTHER): Payer: Medicare Other | Admitting: Nurse Practitioner

## 2017-05-09 VITALS — BP 167/104 | HR 79 | Temp 97.2°F | Ht 63.0 in | Wt 209.0 lb

## 2017-05-09 DIAGNOSIS — Z6833 Body mass index (BMI) 33.0-33.9, adult: Secondary | ICD-10-CM

## 2017-05-09 DIAGNOSIS — M1A9XX0 Chronic gout, unspecified, without tophus (tophi): Secondary | ICD-10-CM | POA: Diagnosis not present

## 2017-05-09 DIAGNOSIS — E1142 Type 2 diabetes mellitus with diabetic polyneuropathy: Secondary | ICD-10-CM

## 2017-05-09 DIAGNOSIS — E785 Hyperlipidemia, unspecified: Secondary | ICD-10-CM

## 2017-05-09 DIAGNOSIS — E559 Vitamin D deficiency, unspecified: Secondary | ICD-10-CM

## 2017-05-09 DIAGNOSIS — M5432 Sciatica, left side: Secondary | ICD-10-CM | POA: Diagnosis not present

## 2017-05-09 DIAGNOSIS — I10 Essential (primary) hypertension: Secondary | ICD-10-CM | POA: Diagnosis not present

## 2017-05-09 LAB — CMP14+EGFR
ALBUMIN: 3.9 g/dL (ref 3.5–4.8)
ALT: 23 IU/L (ref 0–32)
AST: 12 IU/L (ref 0–40)
Albumin/Globulin Ratio: 1.4 (ref 1.2–2.2)
Alkaline Phosphatase: 87 IU/L (ref 39–117)
BUN/Creatinine Ratio: 19 (ref 12–28)
BUN: 13 mg/dL (ref 8–27)
Bilirubin Total: 0.4 mg/dL (ref 0.0–1.2)
CALCIUM: 9.3 mg/dL (ref 8.7–10.3)
CO2: 22 mmol/L (ref 18–29)
CREATININE: 0.68 mg/dL (ref 0.57–1.00)
Chloride: 102 mmol/L (ref 96–106)
GFR calc Af Amer: 101 mL/min/{1.73_m2} (ref >59)
GFR calc non Af Amer: 88 mL/min/{1.73_m2} (ref >59)
GLUCOSE: 159 mg/dL — AB (ref 65–99)
Globulin, Total: 2.8 g/dL (ref 1.5–4.5)
Potassium: 4.2 mmol/L (ref 3.5–5.2)
Sodium: 142 mmol/L (ref 134–144)
TOTAL PROTEIN: 6.7 g/dL (ref 6.0–8.5)

## 2017-05-09 LAB — BAYER DCA HB A1C WAIVED: HB A1C: 7.1 % — AB (ref <7.0)

## 2017-05-09 LAB — LIPID PANEL
CHOLESTEROL TOTAL: 165 mg/dL (ref 100–199)
Chol/HDL Ratio: 4.9 ratio — ABNORMAL HIGH (ref 0.0–4.4)
HDL: 34 mg/dL — ABNORMAL LOW (ref >39)
LDL CALC: 88 mg/dL (ref 0–99)
Triglycerides: 217 mg/dL — ABNORMAL HIGH (ref 0–149)
VLDL CHOLESTEROL CAL: 43 mg/dL — AB (ref 5–40)

## 2017-05-09 LAB — PLEASE NOTE

## 2017-05-09 MED ORDER — VITAMIN D (ERGOCALCIFEROL) 1.25 MG (50000 UNIT) PO CAPS: 50000.0000 [IU] | ORAL_CAPSULE | ORAL | 1 refills | Status: DC

## 2017-05-09 NOTE — Progress Notes (Signed)
Subjective:    Patient ID: Katelyn Ballard, female    DOB: 07-31-1945, 72 y.o.   MRN: [id number]  HPI Katelyn Ballard is here today for follow up of chronic medical problem   Outpatient Encounter Prescriptions as of 05/09/2017  Medication Sig  . allopurinol (ZYLOPRIM) 300 MG tablet Take 1 tablet (300 mg total) by mouth daily.  Marland Kitchen amLODipine (NORVASC) 2.5 MG tablet Take 1 tablet (2.5 mg total) by mouth daily.  . calcium-vitamin D (OSCAL WITH D) 500-200 MG-UNIT per tablet Take 1 tablet by mouth 2 (two) times daily.  . fenofibrate 160 MG tablet Take 1 tablet (160 mg total) by mouth daily.  Marland Kitchen glimepiride (AMARYL) 4 MG tablet 1 po Qam and 1/2 po In evening  . lisinopril (PRINIVIL,ZESTRIL) 40 MG tablet Take 1 tablet (40 mg total) by mouth daily.  . meloxicam (MOBIC) 15 MG tablet TAKE 1 TABLET BY MOUTH ONCE DAILY  . metFORMIN (GLUCOPHAGE) 1000 MG tablet Take 1 tablet (1,000 mg total) by mouth 2 (two) times daily with a meal.  . ONE TOUCH ULTRA TEST test strip USE TO CHECK GLUCOSE TWICE TO THREE TIMES DAILY  . pravastatin (PRAVACHOL) 40 MG tablet Take 1 tablet (40 mg total) by mouth daily.  . predniSONE (DELTASONE) 10 MG tablet Take 5 daily for 3 days followed by 4,3,2 and 1 for 3 days each.  . Vitamin D, Ergocalciferol, (DRISDOL) 50000 units CAPS capsule TAKE ONE CAPSULE BY MOUTH ONCE A WEEK   No facility-administered encounter medications on file as of 05/09/2017.     1. Type 2 diabetes mellitus with diabetic polyneuropathy, without long-term current use of insulin ([name])  Patient taking Amaryl and metformin for management and checking glucose level regularly 2-3 times per day.  2. Hyperlipidemia with target LDL less than 100  Patient managed with pravastatin and low fat diet.  3. Essential hypertension  Following current medication regimen (amlodipine).  Checking BP at home.  4. BMI 33.0-33.9,adult  No recent weight change or loss.  5. Chronic gout without tophus, unspecified cause,  unspecified site  Patient takes allopurinol for management.    New complaints: Patient complains of left hip and leg pain, numbness, and tingling that has been present for about 2-3 months and gradually worsening.  She states in the morning she has a difficult time getting out of bed due to the pain.Patient was prescribed prednisone taper, but never took it because she was concerned it would cause her blood glucose to be elevated.    Review of Systems  Constitutional: Negative for activity change, appetite change and fever.  HENT: Negative for sinus pressure and sore throat.   Eyes: Negative.   Respiratory: Negative for cough and shortness of breath.   Cardiovascular: Negative for chest pain.  Gastrointestinal: Negative for abdominal distention and abdominal pain.  Endocrine: Negative for polydipsia, polyphagia and polyuria.  Genitourinary: Negative.   Musculoskeletal: Positive for arthralgias (left leg and hip x2-3 months).  Skin: Negative.   Neurological: Negative for dizziness and headaches.  Psychiatric/Behavioral: Negative.    Patient stated blood glucose is 130-140 in the morning fasting and usually 150s during the day with meals.     Objective:   Physical Exam  Constitutional: She is oriented to person, place, and time. She appears well-developed and well-nourished.  HENT:  Head: Normocephalic.  Right Ear: External ear normal.  Left Ear: External ear normal.  Mouth/Throat: Oropharynx is clear and moist.  Eyes: Pupils are equal, round, and reactive to  light.  Neck: Normal range of motion. Neck supple.  Cardiovascular: Normal rate, regular rhythm and normal heart sounds.   No murmur heard. Pulmonary/Chest: Effort normal and breath sounds normal.  Abdominal: Soft. Bowel sounds are normal. There is no tenderness. There is no guarding.  Musculoskeletal: Normal range of motion. She exhibits no edema.  Neurological: She is alert and oriented to person, place, and time.    Skin: Skin is warm and dry.  Psychiatric: She has a normal mood and affect. Her behavior is normal. Judgment and thought content normal.    BP (!) 167/104   Pulse 79   Temp 97.2 F (36.2 C) (Oral)   Ht 5' 3"  (1.6 m)   Wt 209 lb (94.8 kg)   BMI 37.02 kg/m  A1C: 7.1%      Assessment & Plan:  1. Type 2 diabetes mellitus with diabetic polyneuropathy, without long-term current use of insulin (HCC) Continue to watch carbs in diet Can take extra metformin in afternoon while on prednisone - Bayer DCA Hb A1c Waived - Microalbumin / creatinine urine ratio  2. Hyperlipidemia with target LDL less than 100 Low fat diet - Lipid panel  3. Essential hypertension Low sodium diet - CMP14+EGFR  4. BMI 33.0-33.9,adult Discussed diet and exercise for person with BMI >25 Will recheck weight in 3-6 months  5. Chronic gout without tophus, unspecified cause, unspecified site  6. Vitamin D deficiency - Vitamin D, Ergocalciferol, (DRISDOL) 50000 units CAPS capsule; Take 1 capsule (50,000 Units total) by mouth once a week.  Dispense: 12 capsule; Refill: 1  7. Sciatica, left side Go ahead and take prednisone that was rx- know that blood sugars will go up some- watch blood sugars    Labs pending Health maintenance reviewed Diet and exercise encouraged Continue all meds Follow up  In 3 months   [place], FNP

## 2017-05-09 NOTE — Patient Instructions (Signed)

## 2017-05-10 LAB — MICROALBUMIN / CREATININE URINE RATIO
CREATININE, UR: 90.1 mg/dL
Microalb/Creat Ratio: 49.4 mg/g creat — ABNORMAL HIGH (ref 0.0–30.0)
Microalbumin, Urine: 44.5 ug/mL

## 2017-06-07 ENCOUNTER — Other Ambulatory Visit: Payer: Self-pay | Admitting: Nurse Practitioner

## 2017-06-07 DIAGNOSIS — M79605 Pain in left leg: Secondary | ICD-10-CM

## 2017-07-08 ENCOUNTER — Ambulatory Visit (INDEPENDENT_AMBULATORY_CARE_PROVIDER_SITE_OTHER): Payer: Medicare Other | Admitting: Family

## 2017-07-08 ENCOUNTER — Other Ambulatory Visit: Payer: Self-pay | Admitting: Family Medicine

## 2017-07-08 ENCOUNTER — Encounter: Payer: Self-pay | Admitting: Family

## 2017-07-08 ENCOUNTER — Ambulatory Visit (INDEPENDENT_AMBULATORY_CARE_PROVIDER_SITE_OTHER): Payer: Medicare Other

## 2017-07-08 VITALS — BP 138/85 | HR 81 | Temp 98.5°F | Ht 63.0 in | Wt 206.8 lb

## 2017-07-08 DIAGNOSIS — M79641 Pain in right hand: Secondary | ICD-10-CM

## 2017-07-08 DIAGNOSIS — Z8739 Personal history of other diseases of the musculoskeletal system and connective tissue: Secondary | ICD-10-CM

## 2017-07-08 MED ORDER — METHYLPREDNISOLONE ACETATE 80 MG/ML IJ SUSP
80.0000 mg | Freq: Once | INTRAMUSCULAR | Status: AC
  Administered 2017-07-08: 80 mg via INTRAMUSCULAR

## 2017-07-08 NOTE — Patient Instructions (Signed)

## 2017-07-08 NOTE — Progress Notes (Signed)
   Subjective:    Patient ID: Katelyn Katelyn, female    DOB: 02/27/1945, 72 y.o.   MRN: [id number]  Pt presents to the office today with right hand and wrist pain. Pt states the pain started over a month ago, but the swelling started over the last few days. PT has hx of gout and taking Allopurinol 300 mg daily. Pt taking Mobic 15 mg daily with no relief.  Hand Pain   The incident occurred more than 1 week ago. There was no injury mechanism. The pain is present in the right hand. The quality of the pain is described as aching. The pain does not radiate. The pain is at a severity of 9/10. The pain is moderate. The pain has been worsening since the incident. Pertinent negatives include no muscle weakness, numbness or tingling. The symptoms are aggravated by movement. She has tried nothing for the symptoms. The treatment provided no relief.      Review of Systems  Musculoskeletal: Positive for arthralgias.  Neurological: Negative for tingling and numbness.  All other systems reviewed and are negative.      Objective:   Physical Exam  Constitutional: She is oriented to person, place, and time. She appears well-developed and well-nourished. No distress.  HENT:  Head: Normocephalic.  Eyes: Pupils are equal, round, and reactive to light.  Neck: Normal range of motion. Neck supple. No thyromegaly present.  Cardiovascular: Normal rate, regular rhythm, normal heart sounds and intact distal pulses.   No murmur heard. Pulmonary/Chest: Effort normal and breath sounds normal. No respiratory distress. She has no wheezes.  Abdominal: Soft. Bowel sounds are normal. She exhibits no distension. There is no tenderness.  Musculoskeletal: Normal range of motion. She exhibits edema (moderate in right hand and wrist ). She exhibits no tenderness.  Full ROM of right hand, pain in right wrist with flexion and extension, slightly erythemas and warmth present  Neurological: She is alert and oriented to person,  place, and time.  Skin: Skin is warm and dry.  Psychiatric: She has a normal mood and affect. Her behavior is normal. Judgment and thought content normal.  Vitals reviewed.  X-ray- Negative Preliminary reading by Jannifer Rodneyhristy Kikue Gerhart, FNP [facility]   BP 138/85 (BP Location: Left Arm, Patient Position: Sitting, Cuff Size: Normal)   Pulse 81   Temp 98.5 F (36.9 C) (Oral)   Ht 5\' 3"  (1.6 m)   Wt 206 lb 12.8 oz (93.8 kg)   BMI 36.63 kg/m      Assessment & Plan:  1. Pain of right hand - Arthritis Panel - DG Hand Complete Right; Future - methylPREDNISolone acetate (DEPO-MEDROL) injection 80 mg; Inject 1 mL (80 mg total) into the muscle once.  2. Hx of gout - Arthritis Panel - DG Hand Complete Right; Future - methylPREDNISolone acetate (DEPO-MEDROL) injection 80 mg; Inject 1 mL (80 mg total) into the muscle once.  I believe this may be related to gout.  Labs pending Strict Low carb diet Continue Mobic and allopurinol  Low purine diet  Jannifer Rodneyhristy Chellsie Gomer, [place]

## 2017-07-09 LAB — ARTHRITIS PANEL
Basophils Absolute: 0 10*3/uL (ref 0.0–0.2)
Basos: 1 %
EOS (ABSOLUTE): 0.2 10*3/uL (ref 0.0–0.4)
Eos: 2 %
HEMATOCRIT: 38.6 % (ref 34.0–46.6)
HEMOGLOBIN: 12.1 g/dL (ref 11.1–15.9)
Immature Grans (Abs): 0 10*3/uL (ref 0.0–0.1)
Immature Granulocytes: 0 %
LYMPHS ABS: 1.8 10*3/uL (ref 0.7–3.1)
Lymphs: 20 %
MCH: 26 pg — ABNORMAL LOW (ref 26.6–33.0)
MCHC: 31.3 g/dL — AB (ref 31.5–35.7)
MCV: 83 fL (ref 79–97)
MONOCYTES: 7 %
Monocytes Absolute: 0.6 10*3/uL (ref 0.1–0.9)
NEUTROS ABS: 6.2 10*3/uL (ref 1.4–7.0)
Neutrophils: 70 %
Platelets: 348 10*3/uL (ref 150–379)
RBC: 4.66 x10E6/uL (ref 3.77–5.28)
RDW: 16.3 % — AB (ref 12.3–15.4)
Rhuematoid fact SerPl-aCnc: 10.4 IU/mL (ref 0.0–13.9)
Sed Rate: 39 mm/hr (ref 0–40)
Uric Acid: 4.8 mg/dL (ref 2.5–7.1)
WBC: 8.9 10*3/uL (ref 3.4–10.8)

## 2017-07-10 ENCOUNTER — Telehealth: Payer: Self-pay | Admitting: *Deleted

## 2017-07-10 NOTE — Telephone Encounter (Signed)
Pt notified of results Verbalizes understanding 

## 2017-07-18 ENCOUNTER — Ambulatory Visit (INDEPENDENT_AMBULATORY_CARE_PROVIDER_SITE_OTHER): Payer: Medicare Other

## 2017-07-18 ENCOUNTER — Encounter: Payer: Self-pay | Admitting: Family

## 2017-07-18 ENCOUNTER — Ambulatory Visit (INDEPENDENT_AMBULATORY_CARE_PROVIDER_SITE_OTHER): Payer: Medicare Other | Admitting: Family

## 2017-07-18 VITALS — BP 134/81 | HR 82 | Temp 97.4°F | Ht 63.0 in | Wt 199.0 lb

## 2017-07-18 DIAGNOSIS — M778 Other enthesopathies, not elsewhere classified: Secondary | ICD-10-CM | POA: Diagnosis not present

## 2017-07-18 DIAGNOSIS — M25531 Pain in right wrist: Secondary | ICD-10-CM | POA: Diagnosis not present

## 2017-07-18 DIAGNOSIS — M79641 Pain in right hand: Secondary | ICD-10-CM

## 2017-07-18 MED ORDER — TRAMADOL HCL 50 MG PO TABS: 50.0000 mg | ORAL_TABLET | Freq: Three times a day (TID) | ORAL | 0 refills | Status: DC | PRN

## 2017-07-18 MED ORDER — PREDNISONE 10 MG (21) PO TBPK: ORAL_TABLET | ORAL | 0 refills | Status: DC

## 2017-07-18 NOTE — Progress Notes (Signed)
   Subjective:    Patient ID: Katelyn Katelyn, female    DOB: 10-14-1945, 72 y.o.   MRN: [id number]    HPI Pt presents to the office today with recurrent right hand pain. PT was seen in the office on 07/08/17 and had negative x-ray and her Uric acid was normal. Pt has hx of gout and currently taking allopurinol 300 mg. PT states her hand had improved after the steroid injection.  Pt believes she may have injured her hand while mowing her yard over a month ago. PT states she was riding a Surveyor, mininglawn mower, but it was very hard to turn the wheel and believes she strained something.  PT reports the pain intermittent 10 out 10. Pt states the steroid and mobic helped, but she continues to have pain and swelling.   Review of Systems     Objective:   Physical Exam  Constitutional: She is oriented to person, place, and time. She appears well-developed and well-nourished. No distress.  HENT:  Head: Normocephalic.  Eyes: Pupils are equal, round, and reactive to light.  Neck: Normal range of motion. Neck supple. No thyromegaly present.  Cardiovascular: Normal rate, regular rhythm, normal heart sounds and intact distal pulses.   No murmur heard. Pulmonary/Chest: Effort normal and breath sounds normal. No respiratory distress. She has no wheezes.  Abdominal: Soft. Bowel sounds are normal. She exhibits no distension. There is no tenderness.  Musculoskeletal: Normal range of motion. She exhibits edema (right wrist swelling and tenderness). She exhibits no tenderness.  Limited ROM with flexion or extension  Neurological: She is alert and oriented to person, place, and time.  Skin: Skin is warm and dry.  Psychiatric: She has a normal mood and affect. Her behavior is normal. Judgment and thought content normal.  Vitals reviewed.  X-ray- Negative Preliminary reading by Jannifer Rodneyhristy Herve Haug, FNP [facility]   Temp (!) 97.4 F (36.3 C) (Oral)   Ht 5\' 3"  (1.6 m)   Wt 199 lb (90.3 kg)   BMI 35.25 kg/m        Assessment & Plan:  1. Right hand pain  2. Right wrist pain - DG Wrist Complete Right; Future - predniSONE (STERAPRED UNI-PAK 21 TAB) 10 MG (21) TBPK tablet; Use as directed  Dispense: 21 tablet; Refill: 0 - traMADol (ULTRAM) 50 MG tablet; Take 1 tablet (50 mg total) by mouth every 8 (eight) hours as needed.  Dispense: 30 tablet; Refill: 0  3. Right wrist tendinitis   If  does not improve will send to [facility]  Avoid re-injury  RTO prn   Jannifer Rodneyhristy Devontaye Ground, FNP

## 2017-07-18 NOTE — Patient Instructions (Signed)
Wrist Pain, Adult There are many things that can cause wrist pain. Some common causes include:  An injury to the wrist area.  Overuse of the joint.  A condition that causes too much pressure to be put on a nerve in the wrist (carpal tunnel syndrome).  Wear and tear of the joints that happens as a person gets older (osteoarthritis).  Other types of arthritis.  Sometimes, the cause of wrist pain is not known. Often, the pain goes away when you follow your doctor's instructions for helping pain at home, such as resting or icing your wrist. If your wrist pain does not go away, it is important to tell your doctor. Follow these instructions at home:  Rest the wrist area for 48 hours or more, or as long as told by your doctor.  If a splint or elastic bandage has been put on your wrist, use it as told by your doctor. ? Take off the splint or bandage only as told by your doctor. ? Loosen the splint or bandage if your fingers tingle, lose feeling (get numb), or turn cold or blue.  If directed, apply ice to the injured area: ? If you have a removable splint or elastic bandage, remove it as told by your doctor. ? Put ice in a plastic bag. ? Place a towel between your skin and the bag or between your splint or bandage and the bag. ? Leave the ice on for 20 minutes, 2-3 times a day.  Keep your arm raised (elevated) above the level of your heart while you are sitting or lying down.  Take over-the-counter and prescription medicines only as told by your doctor.  Keep all follow-up visits as told by your doctor. This is important. Contact a doctor if:  You have a sudden sharp pain in the wrist, hand, or arm that is different or new.  The swelling or bruising on your wrist or hand gets worse.  Your skin becomes red, gets a rash, or has open sores.  Your pain does not get better or it gets worse. Get help right away if:  You lose feeling in your fingers or hand.  Your fingers turn white,  very red, or cold and blue.  You cannot move your fingers.  You have a fever or chills. This information is not intended to replace advice given to you by your health care provider. Make sure you discuss any questions you have with your health care provider. Document Released: 06/04/2008 Document Revised: 07/12/2016 Document Reviewed: 07/05/2016 Elsevier Interactive Patient Education  2017 [employer] Inc[employer]  

## 2017-08-13 ENCOUNTER — Encounter: Payer: Self-pay | Admitting: Nurse Practitioner

## 2017-08-13 ENCOUNTER — Ambulatory Visit (INDEPENDENT_AMBULATORY_CARE_PROVIDER_SITE_OTHER): Payer: Medicare Other | Admitting: Nurse Practitioner

## 2017-08-13 VITALS — BP 154/88 | HR 79 | Temp 97.4°F | Ht 63.0 in | Wt 206.0 lb

## 2017-08-13 DIAGNOSIS — Z6833 Body mass index (BMI) 33.0-33.9, adult: Secondary | ICD-10-CM

## 2017-08-13 DIAGNOSIS — M25531 Pain in right wrist: Secondary | ICD-10-CM | POA: Diagnosis not present

## 2017-08-13 DIAGNOSIS — E1142 Type 2 diabetes mellitus with diabetic polyneuropathy: Secondary | ICD-10-CM

## 2017-08-13 DIAGNOSIS — M1A9XX0 Chronic gout, unspecified, without tophus (tophi): Secondary | ICD-10-CM

## 2017-08-13 DIAGNOSIS — E559 Vitamin D deficiency, unspecified: Secondary | ICD-10-CM | POA: Diagnosis not present

## 2017-08-13 DIAGNOSIS — I10 Essential (primary) hypertension: Secondary | ICD-10-CM | POA: Diagnosis not present

## 2017-08-13 DIAGNOSIS — E785 Hyperlipidemia, unspecified: Secondary | ICD-10-CM

## 2017-08-13 LAB — CMP14+EGFR
A/G RATIO: 1.6 (ref 1.2–2.2)
ALBUMIN: 4 g/dL (ref 3.5–4.8)
ALK PHOS: 55 IU/L (ref 39–117)
ALT: 24 IU/L (ref 0–32)
AST: 12 IU/L (ref 0–40)
BILIRUBIN TOTAL: 0.3 mg/dL (ref 0.0–1.2)
BUN / CREAT RATIO: 23 (ref 12–28)
BUN: 22 mg/dL (ref 8–27)
CHLORIDE: 107 mmol/L — AB (ref 96–106)
CO2: 22 mmol/L (ref 20–29)
Calcium: 9.3 mg/dL (ref 8.7–10.3)
Creatinine, Ser: 0.97 mg/dL (ref 0.57–1.00)
GFR calc non Af Amer: 58 mL/min/{1.73_m2} — ABNORMAL LOW (ref >59)
GFR, EST AFRICAN AMERICAN: 67 mL/min/{1.73_m2} (ref >59)
GLUCOSE: 140 mg/dL — AB (ref 65–99)
Globulin, Total: 2.5 g/dL (ref 1.5–4.5)
POTASSIUM: 4.1 mmol/L (ref 3.5–5.2)
Sodium: 144 mmol/L (ref 134–144)
TOTAL PROTEIN: 6.5 g/dL (ref 6.0–8.5)

## 2017-08-13 LAB — LIPID PANEL
CHOLESTEROL TOTAL: 175 mg/dL (ref 100–199)
Chol/HDL Ratio: 5.1 ratio — ABNORMAL HIGH (ref 0.0–4.4)
HDL: 34 mg/dL — ABNORMAL LOW (ref >39)
LDL Calculated: 96 mg/dL (ref 0–99)
Triglycerides: 223 mg/dL — ABNORMAL HIGH (ref 0–149)
VLDL Cholesterol Cal: 45 mg/dL — ABNORMAL HIGH (ref 5–40)

## 2017-08-13 LAB — BAYER DCA HB A1C WAIVED: HB A1C: 7.1 % — AB (ref <7.0)

## 2017-08-13 MED ORDER — LISINOPRIL 40 MG PO TABS: 40.0000 mg | ORAL_TABLET | Freq: Every day | ORAL | 1 refills | Status: DC

## 2017-08-13 MED ORDER — METFORMIN HCL 1000 MG PO TABS: 1000.0000 mg | ORAL_TABLET | Freq: Two times a day (BID) | ORAL | 1 refills | Status: DC

## 2017-08-13 MED ORDER — GLIMEPIRIDE 4 MG PO TABS: ORAL_TABLET | ORAL | 1 refills | Status: DC

## 2017-08-13 MED ORDER — AMLODIPINE BESYLATE 2.5 MG PO TABS: 2.5000 mg | ORAL_TABLET | Freq: Every day | ORAL | 1 refills | Status: DC

## 2017-08-13 MED ORDER — ALLOPURINOL 300 MG PO TABS: 300.0000 mg | ORAL_TABLET | Freq: Every day | ORAL | 1 refills | Status: DC

## 2017-08-13 MED ORDER — FENOFIBRATE 160 MG PO TABS: 160.0000 mg | ORAL_TABLET | Freq: Every day | ORAL | 5 refills | Status: DC

## 2017-08-13 MED ORDER — PRAVASTATIN SODIUM 40 MG PO TABS: 40.0000 mg | ORAL_TABLET | Freq: Every day | ORAL | 1 refills | Status: DC

## 2017-08-13 NOTE — Progress Notes (Signed)
Subjective:    Patient ID: Katelyn Ballard, female    DOB: 03-13-1945, 72 y.o.   MRN: [id number]  HPI  Katelyn Ballard is here today for follow up of chronic medical problem.  Outpatient Encounter Prescriptions as of 08/13/2017  Medication Sig  . allopurinol (ZYLOPRIM) 300 MG tablet Take 1 tablet (300 mg total) by mouth daily.  Marland Kitchen amLODipine (NORVASC) 2.5 MG tablet Take 1 tablet (2.5 mg total) by mouth daily.  . fenofibrate 160 MG tablet Take 1 tablet (160 mg total) by mouth daily.  Marland Kitchen glimepiride (AMARYL) 4 MG tablet 1 po Qam and 1/2 po In evening  . lisinopril (PRINIVIL,ZESTRIL) 40 MG tablet Take 1 tablet (40 mg total) by mouth daily.  . meloxicam (MOBIC) 15 MG tablet TAKE 1 TABLET BY MOUTH ONCE DAILY  . metFORMIN (GLUCOPHAGE) 1000 MG tablet Take 1 tablet (1,000 mg total) by mouth 2 (two) times daily with a meal.  . ONE TOUCH ULTRA TEST test strip USE ONE STRIP TO CHECK GLUCOSE TWICE DAILY TO THREE TIMES DAILY  . pravastatin (PRAVACHOL) 40 MG tablet Take 1 tablet (40 mg total) by mouth daily.  . predniSONE (STERAPRED UNI-PAK 21 TAB) 10 MG (21) TBPK tablet Use as directed  . traMADol (ULTRAM) 50 MG tablet Take 1 tablet (50 mg total) by mouth every 8 (eight) hours as needed.   No facility-administered encounter medications on file as of 08/13/2017.     1. Type 2 diabetes mellitus with diabetic polyneuropathy, without long-term current use of insulin ([place]) last hgba1c was 7.1%. SHe does not check blood sugar every day but when she does it is usually between 130-140. SHe denies any hypoglycemia  2. Hyperlipidemia with target LDL less than 100  Tries to avoid fried foods but other then that she eats what she wants to  3. Essential hypertension  No c/o chest pain, SOB or headache. DOes not have a way of checking blood pressures at home  4. Vitamin D deficiency  Currently not taking vitamin D- forgets  5. Chronic gout without tophus, unspecified cause, unspecified site   no recent flare up   6. BMI 33.0-33.9,adult  No recent weight changes    New complaints: She has been seen 2 times with hand pain- last visit was given steroids and ultram which has helped. She says pain is actually more in her wrist then in her hand.  Social history:     Review of Systems  Constitutional: Negative for activity change and appetite change.  HENT: Negative.   Eyes: Negative for pain.  Respiratory: Negative for shortness of breath.   Cardiovascular: Negative for chest pain, palpitations and leg swelling.  Gastrointestinal: Negative for abdominal pain.  Endocrine: Negative for polydipsia.  Genitourinary: Negative.   Skin: Negative for rash.  Neurological: Negative for dizziness, weakness and headaches.  Hematological: Does not bruise/bleed easily.  Psychiatric/Behavioral: Negative.   All other systems reviewed and are negative.      Objective:   Physical Exam  Constitutional: She is oriented to person, place, and time. She appears well-developed and well-nourished.  HENT:  Nose: Nose normal.  Mouth/Throat: Oropharynx is clear and moist.  Multiple lower dental carries  Eyes: EOM are normal.  Neck: Trachea normal, normal range of motion and full passive range of motion without pain. Neck supple. No JVD present. Carotid bruit is not present. No thyromegaly present.  Cardiovascular: Normal rate, regular rhythm, normal heart sounds and intact distal pulses.  Exam reveals no gallop and  no friction rub.   No murmur heard. Pulmonary/Chest: Effort normal and breath sounds normal.  Abdominal: Soft. Bowel sounds are normal. She exhibits no distension and no mass. There is no tenderness.  Musculoskeletal: Normal range of motion.  Lymphadenopathy:    She has no cervical adenopathy.  Neurological: She is alert and oriented to person, place, and time. She has normal reflexes.  Skin: Skin is warm and dry.  Psychiatric: She has a normal mood and affect. Her behavior is normal. Judgment and  thought content normal.   BP (!) 154/88   Pulse 79   Temp (!) 97.4 F (36.3 C) (Oral)   Ht 5' 3"  (1.6 m)   Wt 206 lb (93.4 kg)   BMI 36.49 kg/m   hgba1c 7.1%     Assessment & Plan:  1. Type 2 diabetes mellitus with diabetic polyneuropathy, without long-term current use of insulin (HCC) Continue to watch carbs in diet - Bayer DCA Hb A1c Waived - lisinopril (PRINIVIL,ZESTRIL) 40 MG tablet; Take 1 tablet (40 mg total) by mouth daily.  Dispense: 90 tablet; Refill: 1 - amLODipine (NORVASC) 2.5 MG tablet; Take 1 tablet (2.5 mg total) by mouth daily.  Dispense: 90 tablet; Refill: 1 - metFORMIN (GLUCOPHAGE) 1000 MG tablet; Take 1 tablet (1,000 mg total) by mouth 2 (two) times daily with a meal.  Dispense: 180 tablet; Refill: 1 - glimepiride (AMARYL) 4 MG tablet; 1 po Qam and 1/2 po In evening  Dispense: 135 tablet; Refill: 1  2. Hyperlipidemia with target LDL less than 100 Low fta diet - Lipid panel - pravastatin (PRAVACHOL) 40 MG tablet; Take 1 tablet (40 mg total) by mouth daily.  Dispense: 90 tablet; Refill: 1 - fenofibrate 160 MG tablet; Take 1 tablet (160 mg total) by mouth daily.  Dispense: 30 tablet; Refill: 5  3. Essential hypertension Low sodium diet - CMP14+EGFR  4. Vitamin D deficiency Vitamin d 1000 iu OTC daily  5. Chronic gout without tophus, unspecified cause, unspecified site - allopurinol (ZYLOPRIM) 300 MG tablet; Take 1 tablet (300 mg total) by mouth daily.  Dispense: 90 tablet; Refill: 1  6. BMI 33.0-33.9,adult Discussed diet and exercise for person with BMI >25 Will recheck weight in 3-6 months  7. Right wrist pain Wrist brace prn   encouraged to get eye exam Labs pending Health maintenance reviewed Diet and exercise encouraged Continue all meds Follow up  In 3 months   [place], FNP

## 2017-08-13 NOTE — Patient Instructions (Signed)
Wrist Pain, Adult There are many things that can cause wrist pain. Some common causes include:  An injury to the wrist area.  Overuse of the joint.  A condition that causes too much pressure to be put on a nerve in the wrist (carpal tunnel syndrome).  Wear and tear of the joints that happens as a person gets older (osteoarthritis).  Other types of arthritis.  Sometimes, the cause of wrist pain is not known. Often, the pain goes away when you follow your doctor's instructions for helping pain at home, such as resting or icing your wrist. If your wrist pain does not go away, it is important to tell your doctor. Follow these instructions at home:  Rest the wrist area for 48 hours or more, or as long as told by your doctor.  If a splint or elastic bandage has been put on your wrist, use it as told by your doctor. ? Take off the splint or bandage only as told by your doctor. ? Loosen the splint or bandage if your fingers tingle, lose feeling (get numb), or turn cold or blue.  If directed, apply ice to the injured area: ? If you have a removable splint or elastic bandage, remove it as told by your doctor. ? Put ice in a plastic bag. ? Place a towel between your skin and the bag or between your splint or bandage and the bag. ? Leave the ice on for 20 minutes, 2-3 times a day.  Keep your arm raised (elevated) above the level of your heart while you are sitting or lying down.  Take over-the-counter and prescription medicines only as told by your doctor.  Keep all follow-up visits as told by your doctor. This is important. Contact a doctor if:  You have a sudden sharp pain in the wrist, hand, or arm that is different or new.  The swelling or bruising on your wrist or hand gets worse.  Your skin becomes red, gets a rash, or has open sores.  Your pain does not get better or it gets worse. Get help right away if:  You lose feeling in your fingers or hand.  Your fingers turn white,  very red, or cold and blue.  You cannot move your fingers.  You have a fever or chills. This information is not intended to replace advice given to you by your health care provider. Make sure you discuss any questions you have with your health care provider. Document Released: 06/04/2008 Document Revised: 07/12/2016 Document Reviewed: 07/05/2016 Elsevier Interactive Patient Education  2017 [employer] Inc[employer]  

## 2017-08-14 ENCOUNTER — Telehealth: Payer: Self-pay | Admitting: *Deleted

## 2017-08-14 NOTE — Telephone Encounter (Signed)
Patient did not have FOBT cards.  She may take some at next visit but does not wish to come to office for  Cards.

## 2017-08-14 NOTE — Telephone Encounter (Signed)
-----   Message from Katelyn PieriniMary-Margaret Martin, FNP sent at 08/13/2017  2:03 PM EDT ----- Remind patient to please do hemoccult cards given at appointment

## 2017-08-21 ENCOUNTER — Other Ambulatory Visit: Payer: Self-pay | Admitting: Nurse Practitioner

## 2017-08-21 DIAGNOSIS — M79605 Pain in left leg: Secondary | ICD-10-CM

## 2017-08-24 LAB — HM DIABETES EYE EXAM

## 2017-11-14 ENCOUNTER — Encounter: Payer: Self-pay | Admitting: Nurse Practitioner

## 2017-11-14 ENCOUNTER — Ambulatory Visit (INDEPENDENT_AMBULATORY_CARE_PROVIDER_SITE_OTHER): Payer: Medicare Other | Admitting: Nurse Practitioner

## 2017-11-14 VITALS — BP 145/82 | HR 73 | Temp 97.1°F | Ht 63.0 in | Wt 194.0 lb

## 2017-11-14 DIAGNOSIS — Z6833 Body mass index (BMI) 33.0-33.9, adult: Secondary | ICD-10-CM

## 2017-11-14 DIAGNOSIS — I10 Essential (primary) hypertension: Secondary | ICD-10-CM

## 2017-11-14 DIAGNOSIS — E785 Hyperlipidemia, unspecified: Secondary | ICD-10-CM

## 2017-11-14 DIAGNOSIS — E1142 Type 2 diabetes mellitus with diabetic polyneuropathy: Secondary | ICD-10-CM | POA: Diagnosis not present

## 2017-11-14 DIAGNOSIS — E559 Vitamin D deficiency, unspecified: Secondary | ICD-10-CM

## 2017-11-14 DIAGNOSIS — M1A9XX0 Chronic gout, unspecified, without tophus (tophi): Secondary | ICD-10-CM

## 2017-11-14 LAB — BAYER DCA HB A1C WAIVED: HB A1C: 7.3 % — AB (ref <7.0)

## 2017-11-14 NOTE — Progress Notes (Signed)
Subjective:    Patient ID: Katelyn Ballard, female    DOB: Sep 08, 1945, 72 y.o.   MRN: [id number]  HPI   TATIYANNA LASHLEY is here today for follow up of chronic medical problem.  Outpatient Encounter Medications as of 11/14/2017  Medication Sig  . allopurinol (ZYLOPRIM) 300 MG tablet Take 1 tablet (300 mg total) by mouth daily.  Marland Kitchen amLODipine (NORVASC) 2.5 MG tablet Take 1 tablet (2.5 mg total) by mouth daily.  . fenofibrate 160 MG tablet Take 1 tablet (160 mg total) by mouth daily.  Marland Kitchen glimepiride (AMARYL) 4 MG tablet 1 po Qam and 1/2 po In evening  . lisinopril (PRINIVIL,ZESTRIL) 40 MG tablet Take 1 tablet (40 mg total) by mouth daily.  . meloxicam (MOBIC) 15 MG tablet TAKE 1 TABLET BY MOUTH ONCE DAILY  . meloxicam (MOBIC) 15 MG tablet TAKE 1 TABLET BY MOUTH ONCE DAILY  . metFORMIN (GLUCOPHAGE) 1000 MG tablet Take 1 tablet (1,000 mg total) by mouth 2 (two) times daily with a meal.  . ONE TOUCH ULTRA TEST test strip USE ONE STRIP TO CHECK GLUCOSE TWICE DAILY TO THREE TIMES DAILY  . pravastatin (PRAVACHOL) 40 MG tablet Take 1 tablet (40 mg total) by mouth daily.  . traMADol (ULTRAM) 50 MG tablet Take 1 tablet (50 mg total) by mouth every 8 (eight) hours as needed.   No facility-administered encounter medications on file as of 11/14/2017.     1. Type 2 diabetes mellitus with diabetic polyneuropathy, without long-term current use of insulin ([place]) last hgba1c was 7.1%. Not checking blood sugars every day, but have been running high when checking . Denies any hypoglycemic symptoms  2. Hyperlipidemia with target LDL less than 100  Not really watching diet all that much  3. Essential hypertension  No c/o chest pain, sob or headache. Does not check blood pressures at home. BP Readings from Last 3 Encounters:  08/13/17 (!) 154/88  07/18/17 134/81  07/08/17 138/85     4. Chronic gout without tophus, unspecified cause, unspecified site   no flare up in last 3  months  5. Vitamin D  deficiency  Does not always remember to take vit d at home  6. BMI 33.0-33.9,adult  Discussed diet and exercise for person with BMI >25 Will recheck weight in 3-6 months     New complaints: None today  Social history: Lives alone- retired  Review of Systems  Constitutional: Negative for activity change and appetite change.  HENT: Negative.   Eyes: Negative for pain.  Respiratory: Negative for shortness of breath.   Cardiovascular: Negative for chest pain, palpitations and leg swelling.  Gastrointestinal: Negative for abdominal pain.  Endocrine: Negative for polydipsia.  Genitourinary: Negative.   Skin: Negative for rash.  Neurological: Negative for dizziness, weakness and headaches.  Hematological: Does not bruise/bleed easily.  Psychiatric/Behavioral: Negative.   All other systems reviewed and are negative.      Objective:   Physical Exam  Constitutional: She is oriented to person, place, and time. She appears well-developed and well-nourished.  HENT:  Nose: Nose normal.  Mouth/Throat: Oropharynx is clear and moist.  Eyes: EOM are normal.  Neck: Trachea normal, normal range of motion and full passive range of motion without pain. Neck supple. No JVD present. Carotid bruit is not present. No thyromegaly present.  Cardiovascular: Normal rate, regular rhythm, normal heart sounds and intact distal pulses. Exam reveals no gallop and no friction rub.  No murmur heard. Pulmonary/Chest: Effort normal and breath  sounds normal.  Abdominal: Soft. Bowel sounds are normal. She exhibits no distension and no mass. There is no tenderness.  Musculoskeletal: Normal range of motion.  Lymphadenopathy:    She has no cervical adenopathy.  Neurological: She is alert and oriented to person, place, and time. She has normal reflexes.  Skin: Skin is warm and dry.  Psychiatric: She has a normal mood and affect. Her behavior is normal. Judgment and thought content normal.    BP (!) 145/82    Pulse 73   Temp (!) 97.1 F (36.2 C) (Oral)   Ht 5' 3"  (1.6 m)   Wt 194 lb (88 kg)   BMI 34.37 kg/m   hgba1c 7.3%      Assessment & Plan:  1. Type 2 diabetes mellitus with diabetic polyneuropathy, without long-term current use of insulin (HCC) Stricter carb oaunting - Bayer DCA Hb A1c Waived  2. Hyperlipidemia with target LDL less than 100 Low fat diet - Lipid panel  3. Essential hypertension Low sodium diet - CMP14+EGFR  4. Chronic gout without tophus, unspecified cause, unspecified site Low purine diet  5. Vitamin D deficiency  6. BMI 33.0-33.9,adult Discussed diet and exercise for person with BMI >25 Will recheck weight in 3-6 months    Labs pending Health maintenance reviewed Diet and exercise encouraged Continue all meds Follow up  In 3 diet   [place], FNP

## 2017-11-14 NOTE — Patient Instructions (Signed)

## 2017-11-15 LAB — CMP14+EGFR
A/G RATIO: 1.2 (ref 1.2–2.2)
ALBUMIN: 3.9 g/dL (ref 3.5–4.8)
ALT: 11 IU/L (ref 0–32)
AST: 8 IU/L (ref 0–40)
Alkaline Phosphatase: 83 IU/L (ref 39–117)
BILIRUBIN TOTAL: 0.3 mg/dL (ref 0.0–1.2)
BUN / CREAT RATIO: 17 (ref 12–28)
BUN: 13 mg/dL (ref 8–27)
CALCIUM: 9.4 mg/dL (ref 8.7–10.3)
CHLORIDE: 104 mmol/L (ref 96–106)
CO2: 23 mmol/L (ref 20–29)
Creatinine, Ser: 0.77 mg/dL (ref 0.57–1.00)
GFR, EST AFRICAN AMERICAN: 89 mL/min/{1.73_m2} (ref >59)
GFR, EST NON AFRICAN AMERICAN: 77 mL/min/{1.73_m2} (ref >59)
GLOBULIN, TOTAL: 3.3 g/dL (ref 1.5–4.5)
Glucose: 154 mg/dL — ABNORMAL HIGH (ref 65–99)
POTASSIUM: 4.2 mmol/L (ref 3.5–5.2)
Sodium: 142 mmol/L (ref 134–144)
TOTAL PROTEIN: 7.2 g/dL (ref 6.0–8.5)

## 2017-11-15 LAB — LIPID PANEL
CHOL/HDL RATIO: 4.2 ratio (ref 0.0–4.4)
Cholesterol, Total: 152 mg/dL (ref 100–199)
HDL: 36 mg/dL — AB (ref >39)
LDL Calculated: 79 mg/dL (ref 0–99)
Triglycerides: 187 mg/dL — ABNORMAL HIGH (ref 0–149)
VLDL Cholesterol Cal: 37 mg/dL (ref 5–40)

## 2017-12-19 ENCOUNTER — Other Ambulatory Visit: Payer: Self-pay | Admitting: Nurse Practitioner

## 2018-01-22 DIAGNOSIS — R69 Illness, unspecified: Secondary | ICD-10-CM | POA: Diagnosis not present

## 2018-02-14 ENCOUNTER — Ambulatory Visit (INDEPENDENT_AMBULATORY_CARE_PROVIDER_SITE_OTHER): Payer: Medicare HMO | Admitting: Nurse Practitioner

## 2018-02-14 ENCOUNTER — Encounter: Payer: Self-pay | Admitting: Nurse Practitioner

## 2018-02-14 VITALS — BP 152/88 | HR 83 | Temp 98.1°F | Ht 63.0 in | Wt 205.0 lb

## 2018-02-14 DIAGNOSIS — E785 Hyperlipidemia, unspecified: Secondary | ICD-10-CM | POA: Diagnosis not present

## 2018-02-14 DIAGNOSIS — Z6833 Body mass index (BMI) 33.0-33.9, adult: Secondary | ICD-10-CM

## 2018-02-14 DIAGNOSIS — E1142 Type 2 diabetes mellitus with diabetic polyneuropathy: Secondary | ICD-10-CM | POA: Diagnosis not present

## 2018-02-14 DIAGNOSIS — M1A9XX0 Chronic gout, unspecified, without tophus (tophi): Secondary | ICD-10-CM

## 2018-02-14 DIAGNOSIS — E559 Vitamin D deficiency, unspecified: Secondary | ICD-10-CM

## 2018-02-14 DIAGNOSIS — I10 Essential (primary) hypertension: Secondary | ICD-10-CM

## 2018-02-14 LAB — BAYER DCA HB A1C WAIVED: HB A1C (BAYER DCA - WAIVED): 7 % — ABNORMAL HIGH (ref <7.0)

## 2018-02-14 MED ORDER — GLIMEPIRIDE 4 MG PO TABS: ORAL_TABLET | ORAL | 1 refills | Status: DC

## 2018-02-14 MED ORDER — LISINOPRIL 40 MG PO TABS: 40.0000 mg | ORAL_TABLET | Freq: Every day | ORAL | 1 refills | Status: DC

## 2018-02-14 MED ORDER — FENOFIBRATE 160 MG PO TABS: 160.0000 mg | ORAL_TABLET | Freq: Every day | ORAL | 5 refills | Status: DC

## 2018-02-14 MED ORDER — AMLODIPINE BESYLATE 5 MG PO TABS: 5.0000 mg | ORAL_TABLET | Freq: Every day | ORAL | 1 refills | Status: DC

## 2018-02-14 MED ORDER — METFORMIN HCL 1000 MG PO TABS: 1000.0000 mg | ORAL_TABLET | Freq: Two times a day (BID) | ORAL | 1 refills | Status: DC

## 2018-02-14 MED ORDER — PRAVASTATIN SODIUM 40 MG PO TABS: 40.0000 mg | ORAL_TABLET | Freq: Every day | ORAL | 1 refills | Status: DC

## 2018-02-14 NOTE — Patient Instructions (Signed)

## 2018-02-14 NOTE — Progress Notes (Addendum)
Subjective:    Patient ID: Katelyn Ballard, female    DOB: 11/01/1945, 73 y.o.   MRN: [id number]  HPI  AURIEL KIST is here today for follow up of chronic medical problem.  Outpatient Encounter Medications as of 02/14/2018  Medication Sig  . allopurinol (ZYLOPRIM) 300 MG tablet Take 1 tablet (300 mg total) by mouth daily.  Marland Kitchen amLODipine (NORVASC) 2.5 MG tablet Take 1 tablet (2.5 mg total) by mouth daily.  . fenofibrate 160 MG tablet Take 1 tablet (160 mg total) by mouth daily.  Marland Kitchen glimepiride (AMARYL) 4 MG tablet 1 po Qam and 1/2 po In evening  . lisinopril (PRINIVIL,ZESTRIL) 40 MG tablet Take 1 tablet (40 mg total) by mouth daily.  . meloxicam (MOBIC) 15 MG tablet TAKE 1 TABLET BY MOUTH ONCE DAILY  . meloxicam (MOBIC) 15 MG tablet TAKE 1 TABLET BY MOUTH ONCE DAILY  . metFORMIN (GLUCOPHAGE) 1000 MG tablet Take 1 tablet (1,000 mg total) by mouth 2 (two) times daily with a meal.  . ONE TOUCH ULTRA TEST test strip USE 1 STRIP TO CHECK GLUCOSE 2 TO 3 TIMES DAILY  . pravastatin (PRAVACHOL) 40 MG tablet Take 1 tablet (40 mg total) by mouth daily.  . traMADol (ULTRAM) 50 MG tablet Take 1 tablet (50 mg total) by mouth every 8 (eight) hours as needed.     1. Type 2 diabetes mellitus with diabetic polyneuropathy, without long-term current use of insulin ([place]) last hgba1c was 7.3%. She does not check blood sugar everyday  2. Hyperlipidemia with target LDL less than 100  Does not watch diet  3. Essential hypertension  No c/o chest pain, sob or headache. Does not check blood pressure at home. BP Readings from Last 3 Encounters:  11/14/17 (!) 145/82  08/13/17 (!) 154/88  07/18/17 134/81     4. BMI 33.0-33.9,adult  No recent weight change  5. Chronic gout without tophus, unspecified cause, unspecified site  No recent flare up  6. Vitamin D deficiency  Does not always take her vitamin d    New complaints: None today  Social history: Lives alone- has friends and family that come and  check on her.     Review of Systems  Constitutional: Negative for activity change and appetite change.  HENT: Negative.   Eyes: Negative for pain.  Respiratory: Negative for shortness of breath.   Cardiovascular: Negative for chest pain, palpitations and leg swelling.  Gastrointestinal: Negative for abdominal pain.  Endocrine: Negative for polydipsia.  Genitourinary: Negative.   Skin: Negative for rash.  Neurological: Negative for dizziness, weakness and headaches.  Hematological: Does not bruise/bleed easily.  Psychiatric/Behavioral: Negative.   All other systems reviewed and are negative.      Objective:   Physical Exam  Constitutional: She is oriented to person, place, and time. She appears well-developed and well-nourished.  HENT:  Nose: Nose normal.  Mouth/Throat: Oropharynx is clear and moist.  Eyes: EOM are normal.  Neck: Trachea normal, normal range of motion and full passive range of motion without pain. Neck supple. No JVD present. Carotid bruit is not present. No thyromegaly present.  Cardiovascular: Normal rate, regular rhythm, normal heart sounds and intact distal pulses. Exam reveals no gallop and no friction rub.  No murmur heard. Pulmonary/Chest: Effort normal and breath sounds normal.  Abdominal: Soft. Bowel sounds are normal. She exhibits no distension and no mass. There is no tenderness.  Musculoskeletal: Normal range of motion.  Lymphadenopathy:    She has no  cervical adenopathy.  Neurological: She is alert and oriented to person, place, and time. She has normal reflexes.  Skin: Skin is warm and dry.  Psychiatric: She has a normal mood and affect. Her behavior is normal. Judgment and thought content normal.    BP (!) 152/88   Pulse 83   Temp 98.1 F (36.7 C) (Oral)   Ht 5' 3"  (1.6 m)   Wt 205 lb (93 kg)   BMI 36.31 kg/m   hgba1c 7.0%        Assessment & Plan:  1. Type 2 diabetes mellitus with diabetic polyneuropathy, without long-term  current use of insulin (HCC) continue to watch carbs in diet Only need to chack blood sugar fasting in mornings and if sick feeling- do not need to check 3x a day - Bayer DCA Hb A1c Waived  - metFORMIN (GLUCOPHAGE) 1000 MG tablet; Take 1 tablet (1,000 mg total) by mouth 2 (two) times daily with a meal.  Dispense: 180 tablet; Refill: 1 - glimepiride (AMARYL) 4 MG tablet; 1 po Qam and 1/2 po In evening  Dispense: 135 tablet; Refill: 1  2. Hyperlipidemia with target LDL less than 100 Low fat diet - Lipid panel - pravastatin (PRAVACHOL) 40 MG tablet; Take 1 tablet (40 mg total) by mouth daily.  Dispense: 90 tablet; Refill: 1 - fenofibrate 160 MG tablet; Take 1 tablet (160 mg total) by mouth daily.  Dispense: 30 tablet; Refill: 5  3. Essential hypertension Low sodium diet Increased amlodipine from 2.72m daily to 510mdaily - CMP14+EGFR - amLODipine (NORVASC) 5 MG tablet; Take 1 tablet (5 mg total) by mouth daily.  Dispense: 90 tablet; Refill: 1 - lisinopril (PRINIVIL,ZESTRIL) 40 MG tablet; Take 1 tablet (40 mg total) by mouth daily.  Dispense: 90 tablet; Refill: 1  4. BMI 33.0-33.9,adult Discussed diet and exercise for person with BMI >25 Will recheck weight in 3-6 months  5. Chronic gout without tophus, unspecified cause, unspecified site  6. Vitamin D deficiency Take vitamin d supplements OTC daily    Labs pending Health maintenance reviewed Diet and exercise encouraged Continue all meds Follow up  In 3 months   Ma[place]croftFNP

## 2018-02-15 LAB — CMP14+EGFR
A/G RATIO: 1.5 (ref 1.2–2.2)
ALT: 17 IU/L (ref 0–32)
AST: 9 IU/L (ref 0–40)
Albumin: 4.3 g/dL (ref 3.5–4.8)
Alkaline Phosphatase: 76 IU/L (ref 39–117)
BUN/Creatinine Ratio: 24 (ref 12–28)
BUN: 22 mg/dL (ref 8–27)
CHLORIDE: 108 mmol/L — AB (ref 96–106)
CO2: 19 mmol/L — ABNORMAL LOW (ref 20–29)
Calcium: 9.2 mg/dL (ref 8.7–10.3)
Creatinine, Ser: 0.91 mg/dL (ref 0.57–1.00)
GFR calc non Af Amer: 63 mL/min/{1.73_m2} (ref >59)
GFR, EST AFRICAN AMERICAN: 72 mL/min/{1.73_m2} (ref >59)
GLOBULIN, TOTAL: 2.8 g/dL (ref 1.5–4.5)
Glucose: 169 mg/dL — ABNORMAL HIGH (ref 65–99)
POTASSIUM: 4.4 mmol/L (ref 3.5–5.2)
SODIUM: 144 mmol/L (ref 134–144)
TOTAL PROTEIN: 7.1 g/dL (ref 6.0–8.5)

## 2018-02-15 LAB — LIPID PANEL
CHOLESTEROL TOTAL: 170 mg/dL (ref 100–199)
Chol/HDL Ratio: 4.7 ratio — ABNORMAL HIGH (ref 0.0–4.4)
HDL: 36 mg/dL — ABNORMAL LOW (ref >39)
LDL CALC: 91 mg/dL (ref 0–99)
Triglycerides: 215 mg/dL — ABNORMAL HIGH (ref 0–149)
VLDL CHOLESTEROL CAL: 43 mg/dL — AB (ref 5–40)

## 2018-02-25 ENCOUNTER — Encounter: Payer: Self-pay | Admitting: *Deleted

## 2018-03-20 DIAGNOSIS — R69 Illness, unspecified: Secondary | ICD-10-CM | POA: Diagnosis not present

## 2018-04-07 ENCOUNTER — Ambulatory Visit: Payer: Medicare HMO | Admitting: *Deleted

## 2018-04-14 ENCOUNTER — Other Ambulatory Visit: Payer: Self-pay | Admitting: Nurse Practitioner

## 2018-04-14 DIAGNOSIS — R69 Illness, unspecified: Secondary | ICD-10-CM | POA: Diagnosis not present

## 2018-05-15 ENCOUNTER — Ambulatory Visit (INDEPENDENT_AMBULATORY_CARE_PROVIDER_SITE_OTHER): Payer: Medicare HMO | Admitting: Nurse Practitioner

## 2018-05-15 ENCOUNTER — Encounter: Payer: Self-pay | Admitting: Nurse Practitioner

## 2018-05-15 VITALS — BP 142/88 | HR 74 | Temp 97.4°F | Ht 63.0 in | Wt 205.0 lb

## 2018-05-15 DIAGNOSIS — Z1211 Encounter for screening for malignant neoplasm of colon: Secondary | ICD-10-CM

## 2018-05-15 DIAGNOSIS — I10 Essential (primary) hypertension: Secondary | ICD-10-CM

## 2018-05-15 DIAGNOSIS — Z1212 Encounter for screening for malignant neoplasm of rectum: Secondary | ICD-10-CM

## 2018-05-15 DIAGNOSIS — E559 Vitamin D deficiency, unspecified: Secondary | ICD-10-CM

## 2018-05-15 DIAGNOSIS — M1A9XX0 Chronic gout, unspecified, without tophus (tophi): Secondary | ICD-10-CM

## 2018-05-15 DIAGNOSIS — E785 Hyperlipidemia, unspecified: Secondary | ICD-10-CM

## 2018-05-15 DIAGNOSIS — E1142 Type 2 diabetes mellitus with diabetic polyneuropathy: Secondary | ICD-10-CM

## 2018-05-15 DIAGNOSIS — Z6833 Body mass index (BMI) 33.0-33.9, adult: Secondary | ICD-10-CM | POA: Diagnosis not present

## 2018-05-15 LAB — BAYER DCA HB A1C WAIVED: HB A1C (BAYER DCA - WAIVED): 7.5 % — ABNORMAL HIGH (ref <7.0)

## 2018-05-15 MED ORDER — AMLODIPINE BESYLATE 5 MG PO TABS: 5.0000 mg | ORAL_TABLET | Freq: Every day | ORAL | 1 refills | Status: DC

## 2018-05-15 MED ORDER — LISINOPRIL 40 MG PO TABS: 40.0000 mg | ORAL_TABLET | Freq: Every day | ORAL | 1 refills | Status: DC

## 2018-05-15 MED ORDER — METFORMIN HCL 1000 MG PO TABS: 1000.0000 mg | ORAL_TABLET | Freq: Two times a day (BID) | ORAL | 1 refills | Status: DC

## 2018-05-15 MED ORDER — FENOFIBRATE 160 MG PO TABS: 160.0000 mg | ORAL_TABLET | Freq: Every day | ORAL | 1 refills | Status: DC

## 2018-05-15 MED ORDER — PRAVASTATIN SODIUM 40 MG PO TABS: 40.0000 mg | ORAL_TABLET | Freq: Every day | ORAL | 1 refills | Status: DC

## 2018-05-15 MED ORDER — ALLOPURINOL 300 MG PO TABS: 300.0000 mg | ORAL_TABLET | Freq: Every day | ORAL | 1 refills | Status: DC

## 2018-05-15 MED ORDER — GLIMEPIRIDE 4 MG PO TABS: ORAL_TABLET | ORAL | 1 refills | Status: DC

## 2018-05-15 NOTE — Progress Notes (Addendum)
Subjective:    Patient ID: Katelyn Ballard, female    DOB: May 30, 1945, 73 y.o.   MRN: [id number]   Chief Complaint: Medical Management of CHronic issues  HPI:  1. Type 2 diabetes mellitus with diabetic polyneuropathy, without long-term current use of insulin (HCC) last HGBA1c was 7.0%. Fasting blood sugars not checked everyday but have been averaging around 120-170. No hypoglycemia  2. Hyperlipidemia with target LDL less than 100  Does not watch diet very much. Does very little exercise.  3. Essential hypertension  No c/o chest pain, sob or headache. Does not check blood pressure at home. BP Readings from Last 3 Encounters:  02/14/18 (!) 152/88  11/14/17 (!) 145/82  08/13/17 (!) 154/88     4. Vitamin D deficiency  Is currently not taking any supplements  5. Chronic gout without tophus, unspecified cause, unspecified site  No recent flare up until yesterday and right big toe started hurting,  6. BMI 33.0-33.9,adult  No recent weight changes    Outpatient Encounter Medications as of 05/15/2018  Medication Sig  . allopurinol (ZYLOPRIM) 300 MG tablet Take 1 tablet (300 mg total) by mouth daily.  Marland Kitchen amLODipine (NORVASC) 5 MG tablet Take 1 tablet (5 mg total) by mouth daily.  . fenofibrate 160 MG tablet Take 1 tablet (160 mg total) by mouth daily.  Marland Kitchen glimepiride (AMARYL) 4 MG tablet 1 po Qam and 1/2 po In evening  . lisinopril (PRINIVIL,ZESTRIL) 40 MG tablet Take 1 tablet (40 mg total) by mouth daily.  . meloxicam (MOBIC) 15 MG tablet TAKE 1 TABLET BY MOUTH ONCE DAILY  . metFORMIN (GLUCOPHAGE) 1000 MG tablet Take 1 tablet (1,000 mg total) by mouth 2 (two) times daily with a meal.  . ONE TOUCH ULTRA TEST test strip USE 1 STRIP TO CHECK GLUCOSE TWICE DAILY TO THREE TIMES DAILY  . pravastatin (PRAVACHOL) 40 MG tablet Take 1 tablet (40 mg total) by mouth daily.  . traMADol (ULTRAM) 50 MG tablet Take 1 tablet (50 mg total) by mouth every 8 (eight) hours as needed.       New  complaints: None today  Social history: Lives alone- family and friends check on her often   Review of Systems  Constitutional: Negative for activity change and appetite change.  HENT: Negative.   Eyes: Negative for pain.  Respiratory: Negative for shortness of breath.   Cardiovascular: Negative for chest pain, palpitations and leg swelling.  Gastrointestinal: Negative for abdominal pain.  Endocrine: Negative for polydipsia.  Genitourinary: Negative.   Skin: Negative for rash.  Neurological: Negative for dizziness, weakness and headaches.  Hematological: Does not bruise/bleed easily.  Psychiatric/Behavioral: Negative.   All other systems reviewed and are negative.      Objective:   Physical Exam  Constitutional: She is oriented to person, place, and time.  HENT:  Head: Normocephalic.  Nose: Nose normal.  Mouth/Throat: Oropharynx is clear and moist.  Eyes: Pupils are equal, round, and reactive to light. EOM are normal.  Neck: Normal range of motion. Neck supple. No JVD present. Carotid bruit is not present.  Cardiovascular: Normal rate, regular rhythm, normal heart sounds and intact distal pulses.  Pulmonary/Chest: Effort normal and breath sounds normal. No respiratory distress. She has no wheezes. She has no rales. She exhibits no tenderness.  Abdominal: Soft. Normal appearance, normal aorta and bowel sounds are normal. She exhibits no distension, no abdominal bruit, no pulsatile midline mass and no mass. There is no splenomegaly or hepatomegaly. There is no  tenderness.  Musculoskeletal: Normal range of motion. She exhibits no edema.  Lymphadenopathy:    She has no cervical adenopathy.  Neurological: She is alert and oriented to person, place, and time. She has normal reflexes.  Skin: Skin is warm and dry.  Psychiatric: Judgment normal.    HGBA1c 7.5%  BP (!) 142/88 (BP Location: Left Arm, Cuff Size: Normal)   Pulse 74   Temp (!) 97.4 F (36.3 C) (Oral)   Ht _0   (1.6 m)   Wt 205 lb (93 kg)   BMI 36.31 kg/m       Assessment & Plan:  Katelyn Ballard comes in today with chief complaint of Medical Management of Chronic Issues   Diagnosis and orders addressed:  1. Type 2 diabetes mellitus with diabetic polyneuropathy, without long-term current use of insulin (HCC) Stricter carb counting - Bayer DCA Hb A1c Waived - Microalbumin / creatinine urine ratio - metFORMIN (GLUCOPHAGE) 1000 MG tablet; Take 1 tablet (1,000 mg total) by mouth 2 (two) times daily with a meal.  Dispense: 180 tablet; Refill: 1 - glimepiride (AMARYL) 4 MG tablet; 1 po Qam and 1/2 po In evening  Dispense: 135 tablet; Refill: 1  2. Hyperlipidemia with target LDL less than 100 Low fat diet - Lipid panel - pravastatin (PRAVACHOL) 40 MG tablet; Take 1 tablet (40 mg total) by mouth daily.  Dispense: 90 tablet; Refill: 1 - fenofibrate 160 MG tablet; Take 1 tablet (160 mg total) by mouth daily.  Dispense: 90 tablet; Refill: 1  3. Essential hypertension Low sodium diet - CMP14+EGFR - amLODipine (NORVASC) 5 MG tablet; Take 1 tablet (5 mg total) by mouth daily.  Dispense: 90 tablet; Refill: 1 - lisinopril (PRINIVIL,ZESTRIL) 40 MG tablet; Take 1 tablet (40 mg total) by mouth daily.  Dispense: 90 tablet; Refill: 1  4. Vitamin D deficiency continue daily Vitamin d supplements  5. Chronic gout without tophus, unspecified cause, unspecified site Does not need colchicine right now. Will call if she needs it - allopurinol (ZYLOPRIM) 300 MG tablet; Take 1 tablet (300 mg total) by mouth daily.  Dispense: 90 tablet; Refill: 1  6. BMI 33.0-33.9,adult Discussed diet and exercise for person with BMI >25 Will recheck weight in 3-6 months    Labs pending Health Maintenance reviewed Diet and exercise encouraged  Follow up plan: 3 months   Mary-Margaret Hassell Done, FNP

## 2018-05-15 NOTE — Patient Instructions (Signed)

## 2018-05-16 ENCOUNTER — Ambulatory Visit: Payer: Medicare HMO | Admitting: Nurse Practitioner

## 2018-05-16 LAB — LIPID PANEL
CHOLESTEROL TOTAL: 171 mg/dL (ref 100–199)
Chol/HDL Ratio: 4.6 ratio — ABNORMAL HIGH (ref 0.0–4.4)
HDL: 37 mg/dL — ABNORMAL LOW (ref >39)
LDL Calculated: 101 mg/dL — ABNORMAL HIGH (ref 0–99)
Triglycerides: 163 mg/dL — ABNORMAL HIGH (ref 0–149)
VLDL CHOLESTEROL CAL: 33 mg/dL (ref 5–40)

## 2018-05-16 LAB — CMP14+EGFR
ALBUMIN: 4 g/dL (ref 3.5–4.8)
ALK PHOS: 66 IU/L (ref 39–117)
ALT: 21 IU/L (ref 0–32)
AST: 10 IU/L (ref 0–40)
Albumin/Globulin Ratio: 1.3 (ref 1.2–2.2)
BUN/Creatinine Ratio: 23 (ref 12–28)
BUN: 21 mg/dL (ref 8–27)
Bilirubin Total: 0.2 mg/dL (ref 0.0–1.2)
CHLORIDE: 105 mmol/L (ref 96–106)
CO2: 21 mmol/L (ref 20–29)
CREATININE: 0.9 mg/dL (ref 0.57–1.00)
Calcium: 9.6 mg/dL (ref 8.7–10.3)
GFR calc non Af Amer: 64 mL/min/{1.73_m2} (ref >59)
GFR, EST AFRICAN AMERICAN: 73 mL/min/{1.73_m2} (ref >59)
GLOBULIN, TOTAL: 3.1 g/dL (ref 1.5–4.5)
Glucose: 169 mg/dL — ABNORMAL HIGH (ref 65–99)
Potassium: 4.3 mmol/L (ref 3.5–5.2)
Sodium: 142 mmol/L (ref 134–144)
TOTAL PROTEIN: 7.1 g/dL (ref 6.0–8.5)

## 2018-06-05 ENCOUNTER — Encounter: Payer: Self-pay | Admitting: *Deleted

## 2018-06-09 ENCOUNTER — Ambulatory Visit: Payer: Medicare HMO

## 2018-06-25 ENCOUNTER — Other Ambulatory Visit: Payer: Self-pay | Admitting: Nurse Practitioner

## 2018-06-26 DIAGNOSIS — R69 Illness, unspecified: Secondary | ICD-10-CM | POA: Diagnosis not present

## 2018-07-15 ENCOUNTER — Encounter: Payer: Self-pay | Admitting: *Deleted

## 2018-08-19 ENCOUNTER — Encounter: Payer: Self-pay | Admitting: Nurse Practitioner

## 2018-08-19 ENCOUNTER — Ambulatory Visit (INDEPENDENT_AMBULATORY_CARE_PROVIDER_SITE_OTHER): Payer: Medicare HMO | Admitting: Nurse Practitioner

## 2018-08-19 VITALS — BP 139/78 | HR 75 | Temp 98.2°F | Ht 63.0 in | Wt 202.0 lb

## 2018-08-19 DIAGNOSIS — I159 Secondary hypertension, unspecified: Secondary | ICD-10-CM | POA: Diagnosis not present

## 2018-08-19 DIAGNOSIS — E1142 Type 2 diabetes mellitus with diabetic polyneuropathy: Secondary | ICD-10-CM | POA: Diagnosis not present

## 2018-08-19 DIAGNOSIS — E559 Vitamin D deficiency, unspecified: Secondary | ICD-10-CM | POA: Diagnosis not present

## 2018-08-19 DIAGNOSIS — E785 Hyperlipidemia, unspecified: Secondary | ICD-10-CM | POA: Diagnosis not present

## 2018-08-19 DIAGNOSIS — M25512 Pain in left shoulder: Secondary | ICD-10-CM

## 2018-08-19 DIAGNOSIS — M1A9XX0 Chronic gout, unspecified, without tophus (tophi): Secondary | ICD-10-CM

## 2018-08-19 DIAGNOSIS — M25531 Pain in right wrist: Secondary | ICD-10-CM | POA: Diagnosis not present

## 2018-08-19 DIAGNOSIS — Z6833 Body mass index (BMI) 33.0-33.9, adult: Secondary | ICD-10-CM | POA: Diagnosis not present

## 2018-08-19 LAB — CMP14+EGFR
ALT: 17 IU/L (ref 0–32)
AST: 11 IU/L (ref 0–40)
Albumin/Globulin Ratio: 1.3 (ref 1.2–2.2)
Albumin: 3.8 g/dL (ref 3.5–4.8)
Alkaline Phosphatase: 78 IU/L (ref 39–117)
BUN/Creatinine Ratio: 18 (ref 12–28)
BUN: 19 mg/dL (ref 8–27)
Bilirubin Total: 0.3 mg/dL (ref 0.0–1.2)
CALCIUM: 9.3 mg/dL (ref 8.7–10.3)
CO2: 22 mmol/L (ref 20–29)
CREATININE: 1.06 mg/dL — AB (ref 0.57–1.00)
Chloride: 109 mmol/L — ABNORMAL HIGH (ref 96–106)
GFR calc non Af Amer: 52 mL/min/{1.73_m2} — ABNORMAL LOW (ref >59)
GFR, EST AFRICAN AMERICAN: 60 mL/min/{1.73_m2} (ref >59)
Globulin, Total: 3 g/dL (ref 1.5–4.5)
Glucose: 179 mg/dL — ABNORMAL HIGH (ref 65–99)
Potassium: 4.3 mmol/L (ref 3.5–5.2)
Sodium: 145 mmol/L — ABNORMAL HIGH (ref 134–144)
TOTAL PROTEIN: 6.8 g/dL (ref 6.0–8.5)

## 2018-08-19 LAB — LIPID PANEL
CHOL/HDL RATIO: 4.9 ratio — AB (ref 0.0–4.4)
Cholesterol, Total: 156 mg/dL (ref 100–199)
HDL: 32 mg/dL — AB (ref >39)
LDL Calculated: 85 mg/dL (ref 0–99)
TRIGLYCERIDES: 196 mg/dL — AB (ref 0–149)
VLDL CHOLESTEROL CAL: 39 mg/dL (ref 5–40)

## 2018-08-19 LAB — BAYER DCA HB A1C WAIVED: HB A1C (BAYER DCA - WAIVED): 6.6 % (ref <7.0)

## 2018-08-19 MED ORDER — MELOXICAM 15 MG PO TABS: 15.0000 mg | ORAL_TABLET | Freq: Every day | ORAL | 5 refills | Status: DC

## 2018-08-19 NOTE — Patient Instructions (Signed)
Shoulder Pain Many things can cause shoulder pain, including:  An injury.  Moving the arm in the same way again and again (overuse).  Joint pain (arthritis).  Follow these instructions at home: Take these actions to help with your pain:  Squeeze a soft ball or a foam pad as much as you can. This helps to prevent swelling. It also makes the arm stronger.  Take over-the-counter and prescription medicines only as told by your doctor.  If told, put ice on the area: ? Put ice in a plastic bag. ? Place a towel between your skin and the bag. ? Leave the ice on for 20 minutes, 2-3 times per day. Stop putting on ice if it does not help with the pain.  If you were given a shoulder sling or immobilizer: ? Wear it as told. ? Remove it to shower or bathe. ? Move your arm as little as possible. ? Keep your hand moving. This helps prevent swelling.  Contact a doctor if:  Your pain gets worse.  Medicine does not help your pain.  You have new pain in your arm, hand, or fingers. Get help right away if:  Your arm, hand, or fingers: ? Tingle. ? Are numb. ? Are swollen. ? Are painful. ? Turn white or blue. This information is not intended to replace advice given to you by your health care provider. Make sure you discuss any questions you have with your health care provider. Document Released: 06/04/2008 Document Revised: 08/12/2016 Document Reviewed: 04/11/2015 Elsevier Interactive Patient Education  2018 [employer] Inc[employer]  

## 2018-08-19 NOTE — Progress Notes (Signed)
Subjective:    Patient ID: Katelyn Ballard, female    DOB: 09/22/1945, 73 y.o.   MRN: [id number]   Chief Complaint: Medical Management of Chronic Issues  HPI:  1. Secondary hypertension  nio c/o chest pain, sob or headaches. Does not check blood pressure at home BP Readings from Last 3 Encounters:  05/15/18 (!) 142/88  02/14/18 (!) 152/88  11/14/17 (!) 145/82     2. Type 2 diabetes mellitus with diabetic polyneuropathy, without long-term current use of insulin ([place]) last hgba1c was 7.5%. Fasting blood sugars running around 120-140. No hypoglycemia.  3. Vitamin D deficiency  Does not take a supplement  4. Hyperlipidemia with target LDL less than 100  Does not really watching diet  5. Chronic gout without tophus, unspecified cause, unspecified site  Denies any recent gout flare ups  6. BMI 33.0-33.9,adult  No recent weight changes    Outpatient Encounter Medications as of 08/19/2018  Medication Sig  . allopurinol (ZYLOPRIM) 300 MG tablet Take 1 tablet (300 mg total) by mouth daily.  Marland Kitchen amLODipine (NORVASC) 5 MG tablet Take 1 tablet (5 mg total) by mouth daily.  . fenofibrate 160 MG tablet Take 1 tablet (160 mg total) by mouth daily.  Marland Kitchen glimepiride (AMARYL) 4 MG tablet 1 po Qam and 1/2 po In evening  . lisinopril (PRINIVIL,ZESTRIL) 40 MG tablet Take 1 tablet (40 mg total) by mouth daily.  . meloxicam (MOBIC) 15 MG tablet TAKE 1 TABLET BY MOUTH ONCE DAILY  . metFORMIN (GLUCOPHAGE) 1000 MG tablet Take 1 tablet (1,000 mg total) by mouth 2 (two) times daily with a meal.  . ONE TOUCH ULTRA TEST test strip USE 1 STRIP TO CHECK GLUCOSE TWICE DAILY TO THREE TIMES DAILY  . pravastatin (PRAVACHOL) 40 MG tablet Take 1 tablet (40 mg total) by mouth daily.  . traMADol (ULTRAM) 50 MG tablet Take 1 tablet (50 mg total) by mouth every 8 (eight) hours as needed.       New complaints: - c/o left shoulder pain that started last week. hurts to lay down and sleep rates shoulder pain 8/10  intermittently  Social history: Lives alone   Review of Systems  Constitutional: Negative for activity change and appetite change.  HENT: Negative.   Eyes: Negative for pain.  Respiratory: Negative for shortness of breath.   Cardiovascular: Negative for chest pain, palpitations and leg swelling.  Gastrointestinal: Negative for abdominal pain.  Endocrine: Negative for polydipsia.  Genitourinary: Negative.   Musculoskeletal: Positive for arthralgias (right wrist and left shoulder).  Skin: Negative for rash.  Neurological: Negative for dizziness, weakness and headaches.  Hematological: Does not bruise/bleed easily.  Psychiatric/Behavioral: Negative.   All other systems reviewed and are negative.      Objective:   Physical Exam  Constitutional: She is oriented to person, place, and time.  HENT:  Head: Normocephalic.  Nose: Nose normal.  Mouth/Throat: Oropharynx is clear and moist.  Eyes: Pupils are equal, round, and reactive to light. EOM are normal.  Neck: Normal range of motion. Neck supple. No JVD present. Carotid bruit is not present.  Cardiovascular: Normal rate, regular rhythm, normal heart sounds and intact distal pulses.  Pulmonary/Chest: Effort normal and breath sounds normal. No respiratory distress. She has no wheezes. She has no rales. She exhibits no tenderness.  Abdominal: Soft. Normal appearance, normal aorta and bowel sounds are normal. She exhibits no distension, no abdominal bruit, no pulsatile midline mass and no mass. There is no splenomegaly or hepatomegaly.  There is no tenderness.  Musculoskeletal: Normal range of motion. She exhibits no edema.  Right wrist pain on palpation- FROM without pain Left periscapular pain on palpation with FROM of left shoulder with slight pain on internal rotation. Grips equal bil.  Lymphadenopathy:    She has no cervical adenopathy.  Neurological: She is alert and oriented to person, place, and time. She has normal reflexes.    Skin: Skin is warm and dry.  Psychiatric: Judgment normal.  Nursing note and vitals reviewed.   BP 139/78   Pulse 75   Temp 98.2 F (36.8 C) (Other (Comment))   Ht 5' 3"  (1.6 m)   Wt 202 lb (91.6 kg)   BMI 35.78 kg/m   hgba1c 6.6% today    Assessment & Plan:  YANIA BOGIE comes in today with chief complaint of Hyperlipidemia; Hypertension; and Diabetes   Diagnosis and orders addressed:  1. Secondary hypertension Low sodium diet  2. Type 2 diabetes mellitus with diabetic polyneuropathy, without long-term current use of insulin (HCC) Continue to watch carbs in diet - Bayer DCA Hb A1c Waived - CMP14+EGFR  3. Vitamin D deficiency Need to gt on daily vitamin d supplement  4. Hyperlipidemia with target LDL less than 100 Low fat diet - CMP14+EGFR - Lipid panel  5. Chronic gout without tophus, unspecified cause, unspecified site Low purine diet  6. BMI 33.0-33.9,adult Discussed diet and exercise for person with BMI >25 Will recheck weight in 3-6 months  7. Right wrist pain Wear wrist brace refeses to see ortho  8. Acute pain of left shoulder Rest Moist heat to area - meloxicam (MOBIC) 15 MG tablet; Take 1 tablet (15 mg total) by mouth daily.  Dispense: 30 tablet; Refill: 5   Labs pending Health Maintenance reviewed Diet and exercise encouraged  Follow up plan: 3 months   Mary-Margaret Hassell Done, FNP

## 2018-11-21 ENCOUNTER — Ambulatory Visit: Payer: Medicare HMO | Admitting: Nurse Practitioner

## 2018-12-02 ENCOUNTER — Encounter: Payer: Self-pay | Admitting: Nurse Practitioner

## 2018-12-03 ENCOUNTER — Ambulatory Visit (INDEPENDENT_AMBULATORY_CARE_PROVIDER_SITE_OTHER): Payer: Medicare HMO | Admitting: Nurse Practitioner

## 2018-12-03 ENCOUNTER — Encounter: Payer: Self-pay | Admitting: Nurse Practitioner

## 2018-12-03 VITALS — BP 134/79 | HR 70 | Temp 97.6°F | Ht 63.0 in | Wt 204.0 lb

## 2018-12-03 DIAGNOSIS — E785 Hyperlipidemia, unspecified: Secondary | ICD-10-CM

## 2018-12-03 DIAGNOSIS — Z6833 Body mass index (BMI) 33.0-33.9, adult: Secondary | ICD-10-CM

## 2018-12-03 DIAGNOSIS — M1A9XX0 Chronic gout, unspecified, without tophus (tophi): Secondary | ICD-10-CM

## 2018-12-03 DIAGNOSIS — I159 Secondary hypertension, unspecified: Secondary | ICD-10-CM | POA: Diagnosis not present

## 2018-12-03 DIAGNOSIS — E1142 Type 2 diabetes mellitus with diabetic polyneuropathy: Secondary | ICD-10-CM

## 2018-12-03 DIAGNOSIS — E559 Vitamin D deficiency, unspecified: Secondary | ICD-10-CM | POA: Diagnosis not present

## 2018-12-03 LAB — LIPID PANEL
CHOL/HDL RATIO: 5 ratio — AB (ref 0.0–4.4)
CHOLESTEROL TOTAL: 169 mg/dL (ref 100–199)
HDL: 34 mg/dL — ABNORMAL LOW (ref >39)
LDL CALC: 83 mg/dL (ref 0–99)
TRIGLYCERIDES: 262 mg/dL — AB (ref 0–149)
VLDL CHOLESTEROL CAL: 52 mg/dL — AB (ref 5–40)

## 2018-12-03 LAB — CMP14+EGFR
ALBUMIN: 3.8 g/dL (ref 3.5–4.8)
ALK PHOS: 92 IU/L (ref 39–117)
ALT: 14 IU/L (ref 0–32)
AST: 7 IU/L (ref 0–40)
Albumin/Globulin Ratio: 1.3 (ref 1.2–2.2)
BUN / CREAT RATIO: 20 (ref 12–28)
BUN: 18 mg/dL (ref 8–27)
Bilirubin Total: 0.4 mg/dL (ref 0.0–1.2)
CALCIUM: 9.1 mg/dL (ref 8.7–10.3)
CO2: 21 mmol/L (ref 20–29)
CREATININE: 0.91 mg/dL (ref 0.57–1.00)
Chloride: 104 mmol/L (ref 96–106)
GFR calc Af Amer: 72 mL/min/{1.73_m2} (ref >59)
GFR, EST NON AFRICAN AMERICAN: 62 mL/min/{1.73_m2} (ref >59)
GLOBULIN, TOTAL: 2.9 g/dL (ref 1.5–4.5)
GLUCOSE: 118 mg/dL — AB (ref 65–99)
Potassium: 4.2 mmol/L (ref 3.5–5.2)
SODIUM: 142 mmol/L (ref 134–144)
Total Protein: 6.7 g/dL (ref 6.0–8.5)

## 2018-12-03 LAB — BAYER DCA HB A1C WAIVED: HB A1C (BAYER DCA - WAIVED): 6.7 % (ref <7.0)

## 2018-12-03 MED ORDER — ALLOPURINOL 300 MG PO TABS: 300.0000 mg | ORAL_TABLET | Freq: Every day | ORAL | 1 refills | Status: DC

## 2018-12-03 MED ORDER — PRAVASTATIN SODIUM 40 MG PO TABS: 40.0000 mg | ORAL_TABLET | Freq: Every day | ORAL | 1 refills | Status: DC

## 2018-12-03 MED ORDER — AMLODIPINE BESYLATE 5 MG PO TABS: 5.0000 mg | ORAL_TABLET | Freq: Every day | ORAL | 1 refills | Status: DC

## 2018-12-03 MED ORDER — LISINOPRIL 40 MG PO TABS: 40.0000 mg | ORAL_TABLET | Freq: Every day | ORAL | 1 refills | Status: DC

## 2018-12-03 MED ORDER — GLIMEPIRIDE 4 MG PO TABS: ORAL_TABLET | ORAL | 1 refills | Status: DC

## 2018-12-03 MED ORDER — FENOFIBRATE 160 MG PO TABS: 160.0000 mg | ORAL_TABLET | Freq: Every day | ORAL | 1 refills | Status: DC

## 2018-12-03 MED ORDER — METFORMIN HCL 1000 MG PO TABS: 1000.0000 mg | ORAL_TABLET | Freq: Two times a day (BID) | ORAL | 1 refills | Status: DC

## 2018-12-03 NOTE — Progress Notes (Signed)
Subjective:    Patient ID: Katelyn Ballard, female    DOB: 1945/02/02, 73 y.o.   MRN: [id number]   Chief Complaint: medical management of chronic issues HPI:  1. Secondary hypertension  Mo c/o chest pain, sob or headache. Does not check blood pressure at home.  BP Readings from Last 3 Encounters:  08/19/18 139/78  05/15/18 (!) 142/88  02/14/18 (!) 152/88     2. Type 2 diabetes mellitus with diabetic polyneuropathy, without long-term current use of insulin ([place]) last hgba1c was 6.6%. She  checks blood sugars very often at home. Denies any symptoms of hypoglycemia.  3. BMI 33.0-33.9,adult  No recent weight changes  4. Chronic gout without tophus, unspecified cause, unspecified site  no recent gout flare ups  5. Hyperlipidemia with target LDL less than 100  Does not watch diet and does not exercise  6. Vitamin D deficiency  Takes daily vitamin d supplement    Outpatient Encounter Medications as of 12/03/2018  Medication Sig  . allopurinol (ZYLOPRIM) 300 MG tablet Take 1 tablet (300 mg total) by mouth daily.  Marland Kitchen amLODipine (NORVASC) 5 MG tablet Take 1 tablet (5 mg total) by mouth daily.  . fenofibrate 160 MG tablet Take 1 tablet (160 mg total) by mouth daily.  Marland Kitchen glimepiride (AMARYL) 4 MG tablet 1 po Qam and 1/2 po In evening  . lisinopril (PRINIVIL,ZESTRIL) 40 MG tablet Take 1 tablet (40 mg total) by mouth daily.  . meloxicam (MOBIC) 15 MG tablet Take 1 tablet (15 mg total) by mouth daily.  . metFORMIN (GLUCOPHAGE) 1000 MG tablet Take 1 tablet (1,000 mg total) by mouth 2 (two) times daily with a meal.  . ONE TOUCH ULTRA TEST test strip USE 1 STRIP TO CHECK GLUCOSE TWICE DAILY TO THREE TIMES DAILY  . pravastatin (PRAVACHOL) 40 MG tablet Take 1 tablet (40 mg total) by mouth daily.  . traMADol (ULTRAM) 50 MG tablet Take 1 tablet (50 mg total) by mouth every 8 (eight) hours as needed.     New complaints: Just says she feels "blah " today  Social history: Lives by herself.  Family checks on her daily   Review of Systems  Constitutional: Negative for activity change and appetite change.  HENT: Negative.   Eyes: Negative for pain.  Respiratory: Negative for shortness of breath.   Cardiovascular: Negative for chest pain, palpitations and leg swelling.  Gastrointestinal: Negative for abdominal pain.  Endocrine: Negative for polydipsia.  Genitourinary: Negative.   Skin: Negative for rash.  Neurological: Negative for dizziness, weakness and headaches.  Hematological: Does not bruise/bleed easily.  Psychiatric/Behavioral: Negative.   All other systems reviewed and are negative.      Objective:   Physical Exam  Constitutional: She is oriented to person, place, and time. She appears well-developed and well-nourished. No distress.  HENT:  Head: Normocephalic.  Nose: Nose normal.  Mouth/Throat: Oropharynx is clear and moist.  Eyes: Pupils are equal, round, and reactive to light. EOM are normal.  Neck: Normal range of motion. Neck supple. No JVD present. Carotid bruit is not present.  Cardiovascular: Normal rate, regular rhythm, normal heart sounds and intact distal pulses.  Pulmonary/Chest: Effort normal and breath sounds normal. No respiratory distress. She has no wheezes. She has no rales. She exhibits no tenderness.  Abdominal: Soft. Normal appearance, normal aorta and bowel sounds are normal. She exhibits no distension, no abdominal bruit, no pulsatile midline mass and no mass. There is no splenomegaly or hepatomegaly. There is no  tenderness.  Musculoskeletal: Normal range of motion. She exhibits no edema.  Lymphadenopathy:    She has no cervical adenopathy.  Neurological: She is alert and oriented to person, place, and time. She has normal reflexes.  Skin: Skin is warm and dry.  Psychiatric: She has a normal mood and affect. Her behavior is normal. Judgment and thought content normal.  Tearful some during appointment  Nursing note and vitals  reviewed.   BP 134/79   Pulse 70   Temp 97.6 F (36.4 C) (Oral)   Ht _0  (1.6 m)   Wt 204 lb (92.5 kg)   SpO2 95%   BMI 36.14 kg/m   hgba1c 6.7% today     Assessment & Plan:  Katelyn Ballard comes in today with chief complaint of Medical Management of Chronic Issues ([facility])   Diagnosis and orders addressed:  1. Secondary hypertension Low sodium diet - CMP14+EGFR amLODipine (NORVASC) 5 MG tablet; Take 1 tablet (5 mg total) by mouth daily.  Dispense: 90 tablet; Refill: 1 - lisinopril (PRINIVIL,ZESTRIL) 40 MG tablet; Take 1 tablet (40 mg total) by mouth daily.  Dispense: 90 tablet; Refill: 1    2. Type 2 diabetes mellitus with diabetic polyneuropathy, without long-term current use of insulin (HCC) Watch carbsi ndiet - Bayer DCA Hb A1c Waived - Microalbumin / creatinine urine ratio - metFORMIN (GLUCOPHAGE) 1000 MG tablet; Take 1 tablet (1,000 mg total) by mouth 2 (two) times daily with a meal.  Dispense: 180 tablet; Refill: 1 - glimepiride (AMARYL) 4 MG tablet; 1 po Qam and 1/2 po In evening  Dispense: 135 tablet; Refill: 1  3. BMI 33.0-33.9,adult Discussed diet and exercise for person with BMI >25 Will recheck weight in 3-6 months  4. Chronic gout without tophus, unspecified cause, unspecified site Low purine diet - allopurinol (ZYLOPRIM) 300 MG tablet; Take 1 tablet (300 mg total) by mouth daily.  Dispense: 90 tablet; Refill: 1  5. Hyperlipidemia with target LDL less than 100 Low fat diet - Lipid panel - pravastatin (PRAVACHOL) 40 MG tablet; Take 1 tablet (40 mg total) by mouth daily.  Dispense: 90 tablet; Refill: 1 - fenofibrate 160 MG tablet; Take 1 tablet (160 mg total) by mouth daily.  Dispense: 90 tablet; Refill: 1  6. Vitamin D deficiency Continue vitamin d supplement   Labs pending Health Maintenance reviewed Diet and exercise encouraged  Follow up plan: 3 months   Mary-Margaret Hassell Done, FNP

## 2018-12-03 NOTE — Patient Instructions (Signed)
Stress and Stress Management Stress is a normal reaction to life events. It is what you feel when life demands more than you are used to or more than you can handle. Some stress can be useful. For example, the stress reaction can help you catch the last bus of the day, study for a test, or meet a deadline at work. But stress that occurs too often or for too long can cause problems. It can affect your emotional health and interfere with relationships and normal daily activities. Too much stress can weaken your immune system and increase your risk for physical illness. If you already have a medical problem, stress can make it worse. What are the causes? All sorts of life events may cause stress. An event that causes stress for one person may not be stressful for another person. Major life events commonly cause stress. These may be positive or negative. Examples include losing your job, moving into a new home, getting married, having a baby, or losing a loved one. Less obvious life events may also cause stress, especially if they occur day after day or in combination. Examples include working long hours, driving in traffic, caring for children, being in debt, or being in a difficult relationship. What are the signs or symptoms? Stress may cause emotional symptoms including, the following:  Anxiety. This is feeling worried, afraid, on edge, overwhelmed, or out of control.  Anger. This is feeling irritated or impatient.  Depression. This is feeling sad, down, helpless, or guilty.  Difficulty focusing, remembering, or making decisions.  Stress may cause physical symptoms, including the following:  Aches and pains. These may affect your head, neck, back, stomach, or other areas of your body.  Tight muscles or clenched jaw.  Low energy or trouble sleeping.  Stress may cause unhealthy behaviors, including the following:  Eating to feel better (overeating) or skipping meals.  Sleeping too little,  too much, or both.  Working too much or putting off tasks (procrastination).  Smoking, drinking alcohol, or using drugs to feel better.  How is this diagnosed? Stress is diagnosed through an assessment by your health care provider. Your health care provider will ask questions about your symptoms and any stressful life events.Your health care provider will also ask about your medical history and may order blood tests or other tests. Certain medical conditions and medicine can cause physical symptoms similar to stress. Mental illness can cause emotional symptoms and unhealthy behaviors similar to stress. Your health care provider may refer you to a mental health professional for further evaluation. How is this treated? Stress management is the recommended treatment for stress.The goals of stress management are reducing stressful life events and coping with stress in healthy ways. Techniques for reducing stressful life events include the following:  Stress identification. Self-monitor for stress and identify what causes stress for you. These skills may help you to avoid some stressful events.  Time management. Set your priorities, keep a calendar of events, and learn to say "no." These tools can help you avoid making too many commitments.  Techniques for coping with stress include the following:  Rethinking the problem. Try to think realistically about stressful events rather than ignoring them or overreacting. Try to find the positives in a stressful situation rather than focusing on the negatives.  Exercise. Physical exercise can release both physical and emotional tension. The key is to find a form of exercise you enjoy and do it regularly.  Relaxation techniques. These relax the body and  mind. Examples include yoga, meditation, tai chi, biofeedback, deep breathing, progressive muscle relaxation, listening to music, being out in nature, journaling, and other hobbies. Again, the key is to find  one or more that you enjoy and can do regularly.  Healthy lifestyle. Eat a balanced diet, get plenty of sleep, and do not smoke. Avoid using alcohol or drugs to relax.  Strong support network. Spend time with family, friends, or other people you enjoy being around.Express your feelings and talk things over with someone you trust.  Counseling or talktherapy with a mental health professional may be helpful if you are having difficulty managing stress on your own. Medicine is typically not recommended for the treatment of stress.Talk to your health care provider if you think you need medicine for symptoms of stress. Follow these instructions at home:  Keep all follow-up visits as directed by your health care provider.  Take all medicines as directed by your health care provider. Contact a health care provider if:  Your symptoms get worse or you start having new symptoms.  You feel overwhelmed by your problems and can no longer manage them on your own. Get help right away if:  You feel like hurting yourself or someone else. This information is not intended to replace advice given to you by your health care provider. Make sure you discuss any questions you have with your health care provider. Document Released: 06/12/2001 Document Revised: 05/24/2016 Document Reviewed: 08/11/2013 Elsevier Interactive Patient Education  2017 [employer]  

## 2019-01-26 ENCOUNTER — Ambulatory Visit (INDEPENDENT_AMBULATORY_CARE_PROVIDER_SITE_OTHER): Payer: Medicare Other | Admitting: Family Medicine

## 2019-01-26 ENCOUNTER — Encounter: Payer: Self-pay | Admitting: Family Medicine

## 2019-01-26 ENCOUNTER — Ambulatory Visit (INDEPENDENT_AMBULATORY_CARE_PROVIDER_SITE_OTHER): Payer: Medicare Other

## 2019-01-26 VITALS — BP 130/82 | HR 76 | Temp 98.3°F | Ht 63.0 in | Wt 205.0 lb

## 2019-01-26 DIAGNOSIS — M79642 Pain in left hand: Secondary | ICD-10-CM

## 2019-01-26 DIAGNOSIS — M65332 Trigger finger, left middle finger: Secondary | ICD-10-CM | POA: Diagnosis not present

## 2019-01-26 MED ORDER — PREDNISONE 20 MG PO TABS: ORAL_TABLET | ORAL | 0 refills | Status: DC

## 2019-01-26 MED ORDER — NAPROXEN 500 MG PO TABS: 500.0000 mg | ORAL_TABLET | Freq: Two times a day (BID) | ORAL | 0 refills | Status: AC

## 2019-01-26 NOTE — Patient Instructions (Signed)
Trigger Finger    Trigger finger (stenosing tenosynovitis) is a condition that causes a finger to get stuck in a bent position. Each finger has a tough, cord-like tissue that connects muscle to bone (tendon), and each tendon is surrounded by a tunnel of tissue (tendon sheath). To move your finger, your tendon needs to slide freely through the sheath. Trigger finger happens when the tendon or the sheath thickens, making it difficult to move your finger.  Trigger finger can affect any finger or a thumb. It may affect more than one finger. Mild cases may clear up with rest and medicine. Severe cases require more treatment.  What are the causes?  Trigger finger is caused by a thickened finger tendon or tendon sheath. The cause of this thickening is not known.  What increases the risk?  The following factors may make you more likely to develop this condition:   Doing activities that require a strong grip.   Having rheumatoid arthritis, gout, or diabetes.   Being 40-60 years old.   Being a woman.  What are the signs or symptoms?  Symptoms of this condition include:   Pain when bending or straightening your finger.   Tenderness or swelling where your finger attaches to the palm of your hand.   A lump in the palm of your hand or on the inside of your finger.   Hearing a popping sound when you try to straighten your finger.   Feeling a popping, catching, or locking sensation when you try to straighten your finger.   Being unable to straighten your finger.  How is this diagnosed?  This condition is diagnosed based on your symptoms and a physical exam.  How is this treated?  This condition may be treated by:   Resting your finger and avoiding activities that make symptoms worse.   Wearing a finger splint to keep your finger in a slightly bent position.   Taking NSAIDs to relieve pain and swelling.   Injecting medicine (steroids) into the tendon sheath to reduce swelling and irritation. Injections may need to be  repeated.   Having surgery to open the tendon sheath. This may be done if other treatments do not work and you cannot straighten your finger. You may need physical therapy after surgery.  Follow these instructions at home:     Use moist heat to help reduce pain and swelling as told by your health care provider.   Rest your finger and avoid activities that make pain worse. Return to normal activities as told by your health care provider.   If you have a splint, wear it as told by your health care provider.   Take over-the-counter and prescription medicines only as told by your health care provider.   Keep all follow-up visits as told by your health care provider. This is important.  Contact a health care provider if:   Your symptoms are not improving with home care.  Summary   Trigger finger (stenosing tenosynovitis) causes your finger to get stuck in a bent position, and it can make it difficult and painful to straighten your finger.   This condition develops when a finger tendon or tendon sheath thickens.   Treatment starts with resting, wearing a splint, and taking NSAIDs.   In severe cases, surgery to open the tendon sheath may be needed.  This information is not intended to replace advice given to you by your health care provider. Make sure you discuss any questions you have with your   health care provider.  Document Released: 10/06/2004 Document Revised: 11/27/2016 Document Reviewed: 11/27/2016  Elsevier Interactive Patient Education  2019 [employer]vier Inc[employer]

## 2019-01-26 NOTE — Progress Notes (Signed)
Subjective:    Patient ID: Katelyn Ballard, female    DOB: 10/21/1945, 74 y.o.   MRN: [id number]  Chief Complaint:  Pain and swelling in left  hand/wrist, worse in third digit (no known injury; ongoing for last several weeks but worsening)   HPI: Katelyn Ballard is a 74 y.o. female presenting on 01/26/2019 for Pain and swelling in left  hand/wrist, worse in third digit (no known injury; ongoing for last several weeks but worsening)  Pt presents today with left index finger pain and swelling. Pt states several weeks ago and is getting worse. Pt states she has tenderness to the palm of her hand also. Pt states the pain is worse with bending and trying to pick up objects. She states at times her finger gets stuck. She denies injury. No fever or chills. No open wounds.   Relevant past medical, surgical, family, and social history reviewed and updated as indicated.  Allergies and medications reviewed and updated.   Past Medical History:  Diagnosis Date  . Ankle fracture, right    1990's  . Diabetes mellitus without complication ([place])   . Gout 2006  . Hyperlipidemia   . Hypertension   . Osteopenia     Past Surgical History:  Procedure Laterality Date  . ABDOMINAL HYSTERECTOMY      Social History   Socioeconomic History  . Marital status: Divorced    Spouse name: Not on file  . Number of children: Not on file  . Years of education: Not on file  . Highest education level: Not on file  Occupational History  . Not on file  Social Needs  . Financial resource strain: Not on file  . Food insecurity:    Worry: Not on file    Inability: Not on file  . Transportation needs:    Medical: Not on file    Non-medical: Not on file  Tobacco Use  . Smoking status: Former Smoker    Last attempt to quit: 12/31/1992    Years since quitting: 26.0  . Smokeless tobacco: Never Used  Substance and Sexual Activity  . Alcohol use: No  . Drug use: No  . Sexual activity: Never  Lifestyle    . Physical activity:    Days per week: Not on file    Minutes per session: Not on file  . Stress: Not on file  Relationships  . Social connections:    Talks on phone: Not on file    Gets together: Not on file    Attends religious service: Not on file    Active member of club or organization: Not on file    Attends meetings of clubs or organizations: Not on file    Relationship status: Not on file  . Intimate partner violence:    Fear of current or ex partner: Not on file    Emotionally abused: Not on file    Physically abused: Not on file    Forced sexual activity: Not on file  Other Topics Concern  . Not on file  Social History Narrative  . Not on file    Outpatient Encounter Medications as of 01/26/2019  Medication Sig  . allopurinol (ZYLOPRIM) 300 MG tablet Take 1 tablet (300 mg total) by mouth daily.  Marland Kitchen amLODipine (NORVASC) 5 MG tablet Take 1 tablet (5 mg total) by mouth daily.  . fenofibrate 160 MG tablet Take 1 tablet (160 mg total) by mouth daily.  Marland Kitchen glimepiride (AMARYL) 4 MG  tablet 1 po Qam and 1/2 po In evening  . lisinopril (PRINIVIL,ZESTRIL) 40 MG tablet Take 1 tablet (40 mg total) by mouth daily.  . meloxicam (MOBIC) 15 MG tablet Take 1 tablet (15 mg total) by mouth daily.  . metFORMIN (GLUCOPHAGE) 1000 MG tablet Take 1 tablet (1,000 mg total) by mouth 2 (two) times daily with a meal.  . ONE TOUCH ULTRA TEST test strip USE 1 STRIP TO CHECK GLUCOSE TWICE DAILY TO THREE TIMES DAILY  . pravastatin (PRAVACHOL) 40 MG tablet Take 1 tablet (40 mg total) by mouth daily.  . [DISCONTINUED] traMADol (ULTRAM) 50 MG tablet Take 1 tablet (50 mg total) by mouth every 8 (eight) hours as needed.  . naproxen (NAPROSYN) 500 MG tablet Take 1 tablet (500 mg total) by mouth 2 (two) times daily with a meal for 14 days.  . predniSONE (DELTASONE) 20 MG tablet 2 po at sametime daily for 5 days   No facility-administered encounter medications on file as of 01/26/2019.     Allergies   Allergen Reactions  . Atorvastatin Other (See Comments)    myalgias  . Penicillins     Review of Systems  Constitutional: Negative for chills, fatigue and fever.  Musculoskeletal: Positive for arthralgias (left middle finger).  Skin: Negative for color change, pallor, rash and wound.  Neurological: Negative for weakness and numbness.  Psychiatric/Behavioral: Negative for confusion.  All other systems reviewed and are negative.       Objective:    BP 130/82   Pulse 76   Temp 98.3 F (36.8 C) (Oral)   Ht 5' 3"  (1.6 m)   Wt 205 lb (93 kg)   BMI 36.31 kg/m    Wt Readings from Last 3 Encounters:  01/26/19 205 lb (93 kg)  12/03/18 204 lb (92.5 kg)  08/19/18 202 lb (91.6 kg)    Physical Exam Vitals signs and nursing note reviewed.  Constitutional:      General: She is in acute distress (mild).     Appearance: Normal appearance.  HENT:     Head: Normocephalic and atraumatic.     Mouth/Throat:     Mouth: Mucous membranes are moist.     Pharynx: Oropharynx is clear.  Eyes:     Conjunctiva/sclera: Conjunctivae normal.     Pupils: Pupils are equal, round, and reactive to light.  Cardiovascular:     Rate and Rhythm: Normal rate and regular rhythm.     Pulses: Normal pulses.     Heart sounds: Normal heart sounds. No murmur. No friction rub. No gallop.   Pulmonary:     Effort: Pulmonary effort is normal.     Breath sounds: Normal breath sounds.  Musculoskeletal:     Left hand: She exhibits decreased range of motion (middle finger), tenderness and swelling. She exhibits no bony tenderness, normal two-point discrimination, normal capillary refill, no deformity and no laceration. Normal sensation noted. Decreased sensation is not present in the ulnar distribution, is not present in the medial redistribution and is not present in the radial distribution. Decreased strength noted. She exhibits finger abduction (middle finger). She exhibits no wrist extension trouble.        Hands:  Skin:    General: Skin is warm and dry.     Capillary Refill: Capillary refill takes less than 2 seconds.  Neurological:     General: No focal deficit present.     Mental Status: She is alert and oriented to person, place, and time.  Psychiatric:  Mood and Affect: Mood normal.        Behavior: Behavior normal. Behavior is cooperative.        Thought Content: Thought content normal.        Judgment: Judgment normal.     Results for orders placed or performed in visit on 12/03/18  Bayer DCA Hb A1c Waived  Result Value Ref Range   HB A1C (BAYER DCA - WAIVED) 6.7 <7.0 %  CMP14+EGFR  Result Value Ref Range   Glucose 118 (H) 65 - 99 mg/dL   BUN 18 8 - 27 mg/dL   Creatinine, Ser 0.91 0.57 - 1.00 mg/dL   GFR calc non Af Amer 62 >59 mL/min/1.73   GFR calc Af Amer 72 >59 mL/min/1.73   BUN/Creatinine Ratio 20 12 - 28   Sodium 142 134 - 144 mmol/L   Potassium 4.2 3.5 - 5.2 mmol/L   Chloride 104 96 - 106 mmol/L   CO2 21 20 - 29 mmol/L   Calcium 9.1 8.7 - 10.3 mg/dL   Total Protein 6.7 6.0 - 8.5 g/dL   Albumin 3.8 3.5 - 4.8 g/dL   Globulin, Total 2.9 1.5 - 4.5 g/dL   Albumin/Globulin Ratio 1.3 1.2 - 2.2   Bilirubin Total 0.4 0.0 - 1.2 mg/dL   Alkaline Phosphatase 92 39 - 117 IU/L   AST 7 0 - 40 IU/L   ALT 14 0 - 32 IU/L  Lipid panel  Result Value Ref Range   Cholesterol, Total 169 100 - 199 mg/dL   Triglycerides 262 (H) 0 - 149 mg/dL   HDL 34 (L) >39 mg/dL   VLDL Cholesterol Cal 52 (H) 5 - 40 mg/dL   LDL Calculated 83 0 - 99 mg/dL   Chol/HDL Ratio 5.0 (H) 0.0 - 4.4 ratio     X-Ray: left hand: No acute findings. Preliminary x-ray reading by Monia Pouch, FNP-C, [facility].   Pertinent labs & imaging results that were available during my care of the patient were reviewed by me and considered in my medical decision making.  Assessment & Plan:  Giordana was seen today for pain and swelling in left  hand/wrist, worse in third digit.  Diagnoses and all orders for this  visit:  Left hand pain -     DG Hand Complete Left; Future  Trigger finger, left middle finger Declined steroid injection into site today. Will trial conservative measures with NSAIDs and steroids for 2 weeks. Report any new or worsening symptoms.  -     predniSONE (DELTASONE) 20 MG tablet; 2 po at sametime daily for 5 days -     naproxen (NAPROSYN) 500 MG tablet; Take 1 tablet (500 mg total) by mouth 2 (two) times daily with a meal for 14 days.     Continue all other maintenance medications.  Follow up plan: Return in about 2 weeks (around 02/09/2019), or if symptoms worsen or fail to improve.  Educational handout given for trigger finger  The above assessment and management plan was discussed with the patient. The patient verbalized understanding of and has agreed to the management plan. Patient is aware to call the clinic if symptoms persist or worsen. Patient is aware when to return to the clinic for a follow-up visit. Patient educated on when it is appropriate to go to the emergency department.   Monia Pouch, FNP-C [facility] [phone]

## 2019-02-26 ENCOUNTER — Encounter: Payer: Self-pay | Admitting: Family Medicine

## 2019-02-26 ENCOUNTER — Ambulatory Visit (INDEPENDENT_AMBULATORY_CARE_PROVIDER_SITE_OTHER): Payer: Medicare Other | Admitting: Family Medicine

## 2019-02-26 VITALS — BP 165/85 | HR 81 | Temp 97.9°F | Ht 63.0 in | Wt 209.0 lb

## 2019-02-26 DIAGNOSIS — M65332 Trigger finger, left middle finger: Secondary | ICD-10-CM

## 2019-02-26 MED ORDER — LIDOCAINE HCL (PF) 1 % IJ SOLN
0.3000 mL | Freq: Once | INTRAMUSCULAR | Status: AC
  Administered 2019-02-26: 0.3 mL

## 2019-02-26 MED ORDER — METHYLPREDNISOLONE ACETATE 40 MG/ML IJ SUSP
20.0000 mg | Freq: Once | INTRAMUSCULAR | Status: AC
  Administered 2019-02-26: 20 mg

## 2019-02-26 MED ORDER — NAPROXEN 500 MG PO TABS: 500.0000 mg | ORAL_TABLET | Freq: Two times a day (BID) | ORAL | 1 refills | Status: DC

## 2019-02-26 NOTE — Patient Instructions (Signed)
Trigger Finger    Trigger finger (stenosing tenosynovitis) is a condition that causes a finger to get stuck in a bent position. Each finger has a tough, cord-like tissue that connects muscle to bone (tendon), and each tendon is surrounded by a tunnel of tissue (tendon sheath). To move your finger, your tendon needs to slide freely through the sheath. Trigger finger happens when the tendon or the sheath thickens, making it difficult to move your finger.  Trigger finger can affect any finger or a thumb. It may affect more than one finger. Mild cases may clear up with rest and medicine. Severe cases require more treatment.  What are the causes?  Trigger finger is caused by a thickened finger tendon or tendon sheath. The cause of this thickening is not known.  What increases the risk?  The following factors may make you more likely to develop this condition:   Doing activities that require a strong grip.   Having rheumatoid arthritis, gout, or diabetes.   Being 40-60 years old.   Being a woman.  What are the signs or symptoms?  Symptoms of this condition include:   Pain when bending or straightening your finger.   Tenderness or swelling where your finger attaches to the palm of your hand.   A lump in the palm of your hand or on the inside of your finger.   Hearing a popping sound when you try to straighten your finger.   Feeling a popping, catching, or locking sensation when you try to straighten your finger.   Being unable to straighten your finger.  How is this diagnosed?  This condition is diagnosed based on your symptoms and a physical exam.  How is this treated?  This condition may be treated by:   Resting your finger and avoiding activities that make symptoms worse.   Wearing a finger splint to keep your finger in a slightly bent position.   Taking NSAIDs to relieve pain and swelling.   Injecting medicine (steroids) into the tendon sheath to reduce swelling and irritation. Injections may need to be  repeated.   Having surgery to open the tendon sheath. This may be done if other treatments do not work and you cannot straighten your finger. You may need physical therapy after surgery.  Follow these instructions at home:     Use moist heat to help reduce pain and swelling as told by your health care provider.   Rest your finger and avoid activities that make pain worse. Return to normal activities as told by your health care provider.   If you have a splint, wear it as told by your health care provider.   Take over-the-counter and prescription medicines only as told by your health care provider.   Keep all follow-up visits as told by your health care provider. This is important.  Contact a health care provider if:   Your symptoms are not improving with home care.  Summary   Trigger finger (stenosing tenosynovitis) causes your finger to get stuck in a bent position, and it can make it difficult and painful to straighten your finger.   This condition develops when a finger tendon or tendon sheath thickens.   Treatment starts with resting, wearing a splint, and taking NSAIDs.   In severe cases, surgery to open the tendon sheath may be needed.  This information is not intended to replace advice given to you by your health care provider. Make sure you discuss any questions you have with your   health care provider.  Document Released: 10/06/2004 Document Revised: 11/27/2016 Document Reviewed: 11/27/2016  Elsevier Interactive Patient Education  2019 [employer]vier Inc[employer]

## 2019-02-26 NOTE — Progress Notes (Signed)
Subjective:  Patient ID: Katelyn Ballard, female    DOB: 05/05/1945, 74 y.o.   MRN: [id number]  Chief Complaint:  Follow up hand/finger pain   HPI: Katelyn Ballard is a 74 y.o. female presenting on 02/26/2019 for Follow up hand/finger pain   1. Trigger finger, left middle finger   Pt presents today for follow up of left middle finger pain. She has completed the steroids and naproxen without complete relief of her symptoms. States the stiffness is not as severe but the pain and swelling are still present. States the pain is aching to sharp and 8/10 at worst. States she has not injured the finger. States it is hard to grasp objects due to the pain, stiffness, and swelling.    Relevant past medical, surgical, family, and social history reviewed and updated as indicated.  Allergies and medications reviewed and updated.   Past Medical History:  Diagnosis Date  . Ankle fracture, right    1990's  . Diabetes mellitus without complication ([place])   . Gout 2006  . Hyperlipidemia   . Hypertension   . Osteopenia     Past Surgical History:  Procedure Laterality Date  . ABDOMINAL HYSTERECTOMY      Social History   Socioeconomic History  . Marital status: Divorced    Spouse name: Not on file  . Number of children: Not on file  . Years of education: Not on file  . Highest education level: Not on file  Occupational History  . Not on file  Social Needs  . Financial resource strain: Not on file  . Food insecurity:    Worry: Not on file    Inability: Not on file  . Transportation needs:    Medical: Not on file    Non-medical: Not on file  Tobacco Use  . Smoking status: Former Smoker    Last attempt to quit: 12/31/1992    Years since quitting: 26.1  . Smokeless tobacco: Never Used  Substance and Sexual Activity  . Alcohol use: No  . Drug use: No  . Sexual activity: Never  Lifestyle  . Physical activity:    Days per week: Not on file    Minutes per session: Not on file  .  Stress: Not on file  Relationships  . Social connections:    Talks on phone: Not on file    Gets together: Not on file    Attends religious service: Not on file    Active member of club or organization: Not on file    Attends meetings of clubs or organizations: Not on file    Relationship status: Not on file  . Intimate partner violence:    Fear of current or ex partner: Not on file    Emotionally abused: Not on file    Physically abused: Not on file    Forced sexual activity: Not on file  Other Topics Concern  . Not on file  Social History Narrative  . Not on file    Outpatient Encounter Medications as of 02/26/2019  Medication Sig  . allopurinol (ZYLOPRIM) 300 MG tablet Take 1 tablet (300 mg total) by mouth daily.  Marland Kitchen amLODipine (NORVASC) 5 MG tablet Take 1 tablet (5 mg total) by mouth daily.  . fenofibrate 160 MG tablet Take 1 tablet (160 mg total) by mouth daily.  Marland Kitchen glimepiride (AMARYL) 4 MG tablet 1 po Qam and 1/2 po In evening  . lisinopril (PRINIVIL,ZESTRIL) 40 MG tablet Take 1 tablet (  40 mg total) by mouth daily.  . meloxicam (MOBIC) 15 MG tablet Take 1 tablet (15 mg total) by mouth daily.  . metFORMIN (GLUCOPHAGE) 1000 MG tablet Take 1 tablet (1,000 mg total) by mouth 2 (two) times daily with a meal.  . ONE TOUCH ULTRA TEST test strip USE 1 STRIP TO CHECK GLUCOSE TWICE DAILY TO THREE TIMES DAILY  . pravastatin (PRAVACHOL) 40 MG tablet Take 1 tablet (40 mg total) by mouth daily.  . naproxen (NAPROSYN) 500 MG tablet Take 1 tablet (500 mg total) by mouth 2 (two) times daily with a meal.  . [DISCONTINUED] predniSONE (DELTASONE) 20 MG tablet 2 po at sametime daily for 5 days   Facility-Administered Encounter Medications as of 02/26/2019  Medication  . lidocaine (PF) (XYLOCAINE) 1 % injection 0.3 mL  . methylPREDNISolone acetate (DEPO-MEDROL) injection 20 mg    Allergies  Allergen Reactions  . Atorvastatin Other (See Comments)    myalgias  . Penicillins     Review of  Systems  Constitutional: Negative for chills, fatigue and fever.  Respiratory: Negative for shortness of breath.   Cardiovascular: Negative for chest pain and palpitations.  Musculoskeletal: Positive for arthralgias (left middle finger) and joint swelling (left middle finger).  Skin: Negative for color change and wound.  Neurological: Negative for weakness.  Psychiatric/Behavioral: Negative for confusion.  All other systems reviewed and are negative.       Objective:  BP (!) 165/85   Pulse 81   Temp 97.9 F (36.6 C) (Oral)   Ht _0  (1.6 m)   Wt 209 lb (94.8 kg)   BMI 37.02 kg/m    Wt Readings from Last 3 Encounters:  02/26/19 209 lb (94.8 kg)  01/26/19 205 lb (93 kg)  12/03/18 204 lb (92.5 kg)    Physical Exam Vitals signs and nursing note reviewed.  Constitutional:      General: She is in acute distress (mild).     Appearance: Normal appearance. She is not ill-appearing or toxic-appearing.  HENT:     Head: Normocephalic and atraumatic.  Eyes:     Conjunctiva/sclera: Conjunctivae normal.     Pupils: Pupils are equal, round, and reactive to light.  Cardiovascular:     Rate and Rhythm: Normal rate and regular rhythm.     Pulses: Normal pulses.     Heart sounds: Normal heart sounds. No murmur. No friction rub. No gallop.   Pulmonary:     Effort: Pulmonary effort is normal. No respiratory distress.     Breath sounds: Normal breath sounds.  Musculoskeletal:     Left forearm: Normal.     Left hand: She exhibits decreased range of motion (middle finger), tenderness and swelling (middle finger). She exhibits no bony tenderness, normal two-point discrimination, normal capillary refill, no deformity and no laceration. Normal sensation noted. Decreased strength noted. She exhibits finger abduction (middle finger). She exhibits no thumb/finger opposition and no wrist extension trouble.       Hands:  Skin:    General: Skin is warm and dry.     Capillary Refill: Capillary  refill takes less than 2 seconds.  Neurological:     Mental Status: She is alert and oriented to person, place, and time.  Psychiatric:        Mood and Affect: Mood normal.        Behavior: Behavior normal.        Thought Content: Thought content normal.        Judgment: Judgment normal.  Results for orders placed or performed in visit on 12/03/18  Bayer DCA Hb A1c Waived  Result Value Ref Range   HB A1C (BAYER DCA - WAIVED) 6.7 <7.0 %  CMP14+EGFR  Result Value Ref Range   Glucose 118 (H) 65 - 99 mg/dL   BUN 18 8 - 27 mg/dL   Creatinine, Ser 0.91 0.57 - 1.00 mg/dL   GFR calc non Af Amer 62 >59 mL/min/1.73   GFR calc Af Amer 72 >59 mL/min/1.73   BUN/Creatinine Ratio 20 12 - 28   Sodium 142 134 - 144 mmol/L   Potassium 4.2 3.5 - 5.2 mmol/L   Chloride 104 96 - 106 mmol/L   CO2 21 20 - 29 mmol/L   Calcium 9.1 8.7 - 10.3 mg/dL   Total Protein 6.7 6.0 - 8.5 g/dL   Albumin 3.8 3.5 - 4.8 g/dL   Globulin, Total 2.9 1.5 - 4.5 g/dL   Albumin/Globulin Ratio 1.3 1.2 - 2.2   Bilirubin Total 0.4 0.0 - 1.2 mg/dL   Alkaline Phosphatase 92 39 - 117 IU/L   AST 7 0 - 40 IU/L   ALT 14 0 - 32 IU/L  Lipid panel  Result Value Ref Range   Cholesterol, Total 169 100 - 199 mg/dL   Triglycerides 262 (H) 0 - 149 mg/dL   HDL 34 (L) >39 mg/dL   VLDL Cholesterol Cal 52 (H) 5 - 40 mg/dL   LDL Calculated 83 0 - 99 mg/dL   Chol/HDL Ratio 5.0 (H) 0.0 - 4.4 ratio    Procedure Note:  Left middle finger trigger point injection. Pts name, DOB, allergies, and procedure verified. Verbal consent obtained prior to procedure.   Left middle finger trigger point area prepped with betadine and injected with lidocaine 1% plain and methylprednisolone 20 mg with 25 guage needle x 1/2 inch. Patient tolerated well. Bandage applied post procedure.   Pertinent labs & imaging results that were available during my care of the patient were reviewed by me and considered in my medical decision making.  Assessment &  Plan:  Danniell was seen today for follow up hand/finger pain.  Diagnoses and all orders for this visit:  Trigger finger, left middle finger Failed conservative therapy. Trigger point injection in office today. Aftercare discussed. Follow up in 4-6 weeks. Referral to ortho per pts request. Report any new or worsening symptoms.  -     naproxen (NAPROSYN) 500 MG tablet; Take 1 tablet (500 mg total) by mouth 2 (two) times daily with a meal. -     Ambulatory referral to Orthopedic Surgery -     Inject trigger point, 1 or 2 -     methylPREDNISolone acetate (DEPO-MEDROL) injection 20 mg -     lidocaine (PF) (XYLOCAINE) 1 % injection 0.3 mL     Continue all other maintenance medications.  Follow up plan: Return in about 4 weeks (around 03/26/2019), or if symptoms worsen or fail to improve.  Educational handout given for trigger finger  The above assessment and management plan was discussed with the patient. The patient verbalized understanding of and has agreed to the management plan. Patient is aware to call the clinic if symptoms persist or worsen. Patient is aware when to return to the clinic for a follow-up visit. Patient educated on when it is appropriate to go to the emergency department.   Monia Pouch, FNP-C [facility] [phone]

## 2019-03-09 ENCOUNTER — Encounter: Payer: Self-pay | Admitting: Nurse Practitioner

## 2019-03-09 ENCOUNTER — Encounter: Payer: Medicare HMO | Admitting: Nurse Practitioner

## 2019-03-09 NOTE — Progress Notes (Signed)
   Subjective:    Patient ID: Katelyn Ballard, female    DOB: July 08, 1945, 74 y.o.   MRN: [id number]

## 2019-05-19 ENCOUNTER — Other Ambulatory Visit: Payer: Self-pay | Admitting: Nurse Practitioner

## 2019-05-19 DIAGNOSIS — E1142 Type 2 diabetes mellitus with diabetic polyneuropathy: Secondary | ICD-10-CM

## 2019-05-19 DIAGNOSIS — E785 Hyperlipidemia, unspecified: Secondary | ICD-10-CM

## 2019-05-19 DIAGNOSIS — I159 Secondary hypertension, unspecified: Secondary | ICD-10-CM

## 2019-05-19 DIAGNOSIS — M1A9XX0 Chronic gout, unspecified, without tophus (tophi): Secondary | ICD-10-CM

## 2019-05-20 ENCOUNTER — Other Ambulatory Visit: Payer: Self-pay | Admitting: Nurse Practitioner

## 2019-05-20 DIAGNOSIS — E1142 Type 2 diabetes mellitus with diabetic polyneuropathy: Secondary | ICD-10-CM

## 2019-05-26 ENCOUNTER — Other Ambulatory Visit: Payer: Self-pay | Admitting: Family Medicine

## 2019-05-26 DIAGNOSIS — M65332 Trigger finger, left middle finger: Secondary | ICD-10-CM

## 2019-06-01 ENCOUNTER — Other Ambulatory Visit: Payer: Self-pay | Admitting: Nurse Practitioner

## 2019-06-01 DIAGNOSIS — E1142 Type 2 diabetes mellitus with diabetic polyneuropathy: Secondary | ICD-10-CM

## 2019-06-04 ENCOUNTER — Other Ambulatory Visit: Payer: Self-pay | Admitting: Nurse Practitioner

## 2019-06-04 DIAGNOSIS — E785 Hyperlipidemia, unspecified: Secondary | ICD-10-CM

## 2019-06-05 ENCOUNTER — Ambulatory Visit (INDEPENDENT_AMBULATORY_CARE_PROVIDER_SITE_OTHER): Payer: Medicare Other | Admitting: Nurse Practitioner

## 2019-06-05 ENCOUNTER — Encounter: Payer: Self-pay | Admitting: Nurse Practitioner

## 2019-06-05 ENCOUNTER — Other Ambulatory Visit: Payer: Self-pay

## 2019-06-05 DIAGNOSIS — M1A9XX0 Chronic gout, unspecified, without tophus (tophi): Secondary | ICD-10-CM

## 2019-06-05 DIAGNOSIS — E559 Vitamin D deficiency, unspecified: Secondary | ICD-10-CM

## 2019-06-05 DIAGNOSIS — E785 Hyperlipidemia, unspecified: Secondary | ICD-10-CM | POA: Diagnosis not present

## 2019-06-05 DIAGNOSIS — I159 Secondary hypertension, unspecified: Secondary | ICD-10-CM

## 2019-06-05 DIAGNOSIS — E1142 Type 2 diabetes mellitus with diabetic polyneuropathy: Secondary | ICD-10-CM | POA: Diagnosis not present

## 2019-06-05 DIAGNOSIS — Z6833 Body mass index (BMI) 33.0-33.9, adult: Secondary | ICD-10-CM

## 2019-06-05 MED ORDER — FENOFIBRATE 160 MG PO TABS: 160.0000 mg | ORAL_TABLET | Freq: Every day | ORAL | 5 refills | Status: DC

## 2019-06-05 MED ORDER — AMLODIPINE BESYLATE 5 MG PO TABS: 5.0000 mg | ORAL_TABLET | Freq: Every day | ORAL | 5 refills | Status: DC

## 2019-06-05 MED ORDER — PRAVASTATIN SODIUM 40 MG PO TABS: 40.0000 mg | ORAL_TABLET | Freq: Every day | ORAL | 5 refills | Status: DC

## 2019-06-05 MED ORDER — METFORMIN HCL 1000 MG PO TABS: ORAL_TABLET | ORAL | 5 refills | Status: DC

## 2019-06-05 MED ORDER — LISINOPRIL 40 MG PO TABS: 40.0000 mg | ORAL_TABLET | Freq: Every day | ORAL | 5 refills | Status: DC

## 2019-06-05 MED ORDER — ALLOPURINOL 300 MG PO TABS: 300.0000 mg | ORAL_TABLET | Freq: Every day | ORAL | 5 refills | Status: DC

## 2019-06-05 NOTE — Progress Notes (Signed)
Virtual Visit via telephone Note  I connected with@ on 06/05/19 at 8:00 AM by video and verified that I am speaking with the correct person using two identifiers. Katelyn Katelyn is currently located at home and no one is currently with her during visit. The provider, Mary-Margaret Daphine DeutscherMartin, FNP is located in their office at time of visit.  I discussed the limitations, risks, security and privacy concerns of performing an evaluation and management service by telephone and the availability of in person appointments. I also discussed with the patient that there may be a patient responsible charge related to this service. The patient expressed understanding and agreed to proceed.   History and Present Illness:   Chief Complaint: Medical Management of Chronic Issues    HPI:  1. Secondary hypertension No c/o chest pain, SOB or headache. Does not check blood pressure at home. BP Readings from Last 3 Encounters:  02/26/19 (!) 165/85  01/26/19 130/82  12/03/18 134/79     2. Hyperlipidemia with target LDL less than 100 Not really watching diet. Does no exercise  3. Type 2 diabetes mellitus with diabetic polyneuropathy, without long-term current use of insulin (HCC) Last HGBA1c was 6.7%. Her fasting blood sugars have been running 120-140,but the last week has been running close to 200. She denies any low blood sugars.  4. Chronic gout without tophus, unspecified cause, unspecified site No recent gout flare ups  5. Vitamin D deficiency Has not been taking her vitamin D supplements daily  6. BMI 33.0-33.9,adult Weight has gone up about 5lbs according to patient    Outpatient Encounter Medications as of 06/05/2019  Medication Sig  . allopurinol (ZYLOPRIM) 300 MG tablet Take 1 tablet (300 mg total) by mouth daily. (Needs to be seen before next refill)  . amLODipine (NORVASC) 5 MG tablet Take 1 tablet (5 mg total) by mouth daily. (Needs to be seen before next refill)  . fenofibrate 160  MG tablet Take 1 tablet (160 mg total) by mouth daily.  Marland Kitchen. glimepiride (AMARYL) 4 MG tablet 1 po Qam and 1/2 po In evening  . lisinopril (ZESTRIL) 40 MG tablet Take 1 tablet (40 mg total) by mouth daily. (Needs to be seen before next refill)  . meloxicam (MOBIC) 15 MG tablet Take 1 tablet (15 mg total) by mouth daily.  . metFORMIN (GLUCOPHAGE) 1000 MG tablet TAKE 1/2 (ONE-HALF) TABLET BY MOUTH TWICE DAILY WITH MEALS (NEEDS TO BE SEEN BEFORE NEXT REFILL)  . naproxen (NAPROSYN) 500 MG tablet TAKE 1 TABLET BY MOUTH TWICE DAILY WITH MEALS  . ONE TOUCH ULTRA TEST test strip USE 1 STRIP TO CHECK GLUCOSE TWICE DAILY TO THREE TIMES DAILY  . pravastatin (PRAVACHOL) 40 MG tablet Take 1 tablet (40 mg total) by mouth daily. (Needs to be seen before next refill)      New complaints: None today  Social history: Lives by herself, but she has a son that livs next door.     Review of Systems  Constitutional: Negative for diaphoresis and weight loss.  Eyes: Negative for blurred vision, double vision and pain.  Respiratory: Negative for shortness of breath.   Cardiovascular: Negative for chest pain, palpitations, orthopnea and leg swelling.  Gastrointestinal: Negative for abdominal pain.  Musculoskeletal: Positive for joint pain (knees ).  Skin: Negative for rash.  Neurological: Negative for dizziness, sensory change, loss of consciousness, weakness and headaches.  Endo/Heme/Allergies: Negative for polydipsia. Does not bruise/bleed easily.  Psychiatric/Behavioral: Negative for memory loss. The patient does not  have insomnia.   All other systems reviewed and are negative.      Observations/Objective: Alert and oriented No distress noted  Assessment and Plan: Katelyn Katelyn comes in today with chief complaint of Medical Management of Chronic Issues   Diagnosis and orders addressed:  1. Secondary hypertension Low sodium diet - amLODipine (NORVASC) 5 MG tablet; Take 1 tablet (5 mg total)  by mouth daily. (Needs to be seen before next refill)  Dispense: 30 tablet; Refill: 5 - lisinopril (ZESTRIL) 40 MG tablet; Take 1 tablet (40 mg total) by mouth daily. (Needs to be seen before next refill)  Dispense: 30 tablet; Refill: 5  2. Hyperlipidemia with target LDL less than 100 Low fat diet - pravastatin (PRAVACHOL) 40 MG tablet; Take 1 tablet (40 mg total) by mouth daily. (Needs to be seen before next refill)  Dispense: 30 tablet; Refill: 5 - fenofibrate 160 MG tablet; Take 1 tablet (160 mg total) by mouth daily.  Dispense: 30 tablet; Refill: 5  3. Type 2 diabetes mellitus with diabetic polyneuropathy, without long-term current use of insulin (HCC) Watch carbs in diet - metFORMIN (GLUCOPHAGE) 1000 MG tablet; TAKE 1/2 (ONE-HALF) TABLET BY MOUTH TWICE DAILY WITH MEALS (NEEDS TO BE SEEN BEFORE NEXT REFILL)  Dispense: 60 tablet; Refill: 5  4. Chronic gout without tophus, unspecified cause, unspecified site - allopurinol (ZYLOPRIM) 300 MG tablet; Take 1 tablet (300 mg total) by mouth daily. (Needs to be seen before next refill)  Dispense: 30 tablet; Refill: 5  5. Vitamin D deficiency Needs daily vitamin d supplement  6. BMI 33.0-33.9,adult Discussed diet and exercise for person with BMI >25 Will recheck weight in 3-6 months  Needs eye exam- patient will make follow up Labs pending Health Maintenance reviewed Diet and exercise encouraged  Follow up plan: 3 months     I discussed the assessment and treatment plan with the patient. The patient was provided an opportunity to ask questions and all were answered. The patient agreed with the plan and demonstrated an understanding of the instructions.   The patient was advised to call back or seek an in-person evaluation if the symptoms worsen or if the condition fails to improve as anticipated.  The above assessment and management plan was discussed with the patient. The patient verbalized understanding of and has agreed to the  management plan. Patient is aware to call the clinic if symptoms persist or worsen. Patient is aware when to return to the clinic for a follow-up visit. Patient educated on when it is appropriate to go to the emergency department.   Time call ended: 8:15  I provided 15 minutes of face-to-face time during this encounter.    Mary-Margaret Daphine Deutscher, FNP

## 2019-07-17 ENCOUNTER — Other Ambulatory Visit: Payer: Self-pay | Admitting: Nurse Practitioner

## 2019-08-25 ENCOUNTER — Ambulatory Visit (INDEPENDENT_AMBULATORY_CARE_PROVIDER_SITE_OTHER): Payer: Medicare Other | Admitting: *Deleted

## 2019-08-25 DIAGNOSIS — Z0000 Encounter for general adult medical examination without abnormal findings: Secondary | ICD-10-CM

## 2019-08-25 NOTE — Progress Notes (Addendum)
MEDICARE ANNUAL WELLNESS VISIT  08/25/2019  Telephone Visit Disclaimer This Medicare AWV was conducted by telephone due to national recommendations for restrictions regarding the COVID-19 Pandemic (e.g. social distancing).  I verified, using two identifiers, that I am speaking with Katelyn Ballard or their authorized healthcare agent. I discussed the limitations, risks, security, and privacy concerns of performing an evaluation and management service by telephone and the potential availability of an in-person appointment in the future. The patient expressed understanding and agreed to proceed.   Subjective:  Katelyn Katelyn is a 74 y.o. female patient of Katelyn Ballard, Cochran who had a Medicare Annual Wellness Visit today via telephone. Katelyn Ballard is Retired and lives alone. she has 5 children. she reports that she is socially active and does interact with friends/family regularly. she is not physically active and enjoys doing word search puzzles and sitting on her front porch.  Patient Care Team: Katelyn Pretty, FNP as PCP - General (Family Medicine) Katelyn Ballard as Referring Physician (Optometry) Katelyn Ballard, DPM as Consulting Physician (Podiatry)  Advanced Directives 08/25/2019 06/27/2015 04/08/2015 11/19/2014  Does Patient Have a Medical Advance Directive? No No No No  Would patient like information on creating a medical advance directive? No - Patient declined Yes - Scientist, clinical (histocompatibility and immunogenetics) given Yes - Scientist, clinical (histocompatibility and immunogenetics) given Yes - Educational materials given    Hospital Utilization Over the Past 12 Months: # of hospitalizations or ER visits: 0 # of surgeries: 0  Review of Systems    Patient reports that her overall health is unchanged compared to last year.  Patient Reported Readings (BP, Pulse, CBG, Weight, etc) none  Review of Systems: History obtained from chart review  All other systems negative.  Pain Assessment Pain : No/denies pain      Current Medications & Allergies (verified) Allergies as of 08/25/2019      Reactions   Atorvastatin Other (See Comments)   myalgias   Penicillins       Medication List       Accurate as of August 25, 2019 10:35 AM. If you have any questions, ask your nurse or doctor.        allopurinol 300 MG tablet Commonly known as: ZYLOPRIM Take 1 tablet (300 mg total) by mouth daily. (Needs to be seen before next refill)   amLODipine 5 MG tablet Commonly known as: NORVASC Take 1 tablet (5 mg total) by mouth daily. (Needs to be seen before next refill)   fenofibrate 160 MG tablet Take 1 tablet (160 mg total) by mouth daily.   glimepiride 4 MG tablet Commonly known as: AMARYL 1 po Qam and 1/2 po In evening   lisinopril 40 MG tablet Commonly known as: ZESTRIL Take 1 tablet (40 mg total) by mouth daily. (Needs to be seen before next refill)   meloxicam 15 MG tablet Commonly known as: MOBIC Take 1 tablet (15 mg total) by mouth daily.   metFORMIN 1000 MG tablet Commonly known as: GLUCOPHAGE TAKE 1/2 (ONE-HALF) TABLET BY MOUTH TWICE DAILY WITH MEALS (NEEDS TO BE SEEN BEFORE NEXT REFILL)   naproxen 500 MG tablet Commonly known as: NAPROSYN TAKE 1 TABLET BY MOUTH TWICE DAILY WITH MEALS   OneTouch Ultra test strip Generic drug: glucose blood USE 1 STRIP TO CHECK GLUCOSE TWICE DAILY TO THREE TIMES DAILY   pravastatin 40 MG tablet Commonly known as: PRAVACHOL Take 1 tablet (40 mg total) by mouth daily. (Needs to be seen before next refill)  History (reviewed): Past Medical History:  Diagnosis Date  . Ankle fracture, right    1990's  . Diabetes mellitus without complication (HCC)   . Gout 2006  . Hyperlipidemia   . Hypertension   . Osteopenia    Past Surgical History:  Procedure Laterality Date  . ABDOMINAL HYSTERECTOMY     Family History  Problem Relation Age of Onset  . Diabetes Mother   . Heart disease Mother   . Kidney disease Mother        blockage of  renal artery  . Dementia Mother   . Heart disease Father   . Heart disease Sister   . Diabetes Sister   . Diabetes Sister    Social History   Socioeconomic History  . Marital status: Divorced    Spouse name: Not on file  . Number of children: 5  . Years of education: 7611  . Highest education level: 11th grade  Occupational History  . Occupation: retired  Engineer, productionocial Needs  . Financial resource strain: Not hard at all  . Food insecurity    Worry: Never true    Inability: Never true  . Transportation needs    Medical: No    Non-medical: No  Tobacco Use  . Smoking status: Former Smoker    Quit date: 12/31/1992    Years since quitting: 26.6  . Smokeless tobacco: Never Used  Substance and Sexual Activity  . Alcohol use: No  . Drug use: No  . Sexual activity: Not Currently  Lifestyle  . Physical activity    Days per week: 0 days    Minutes per session: 0 min  . Stress: Not at all  Relationships  . Social connections    Talks on phone: More than three times a week    Gets together: Three times a week    Attends religious service: More than 4 times per year    Active member of club or organization: Yes    Attends meetings of clubs or organizations: More than 4 times per year    Relationship status: Divorced  Other Topics Concern  . Not on file  Social History Narrative  . Not on file    Activities of Daily Living In your present state of health, do you have any difficulty performing the following activities: 08/25/2019  Hearing? N  Vision? Y  Comment has cataracts, surgery was postponed due to COVID-19 Pandemic  Difficulty concentrating or making decisions? N  Walking or climbing stairs? N  Dressing or bathing? N  Doing errands, shopping? N  Preparing Food and eating ? N  Using the Toilet? N  In the past six months, have you accidently leaked urine? Y  Comment pt has to wear a pad all the time  Do you have problems with loss of bowel control? N  Managing your  Medications? N  Managing your Finances? N  Housekeeping or managing your Housekeeping? N  Some recent data might be hidden    Patient Education/ Literacy How often do you need to have someone help you when you read instructions, pamphlets, or other written materials from your doctor or pharmacy?: 1 - Never What is the last grade level you completed in school?: 11th grade  Exercise Current Exercise Habits: The patient does not participate in regular exercise at present, Exercise limited by: Other - see comments(pt states she "just gives out" can't do too much)  Diet Patient reports consuming 3 meals a day and 1 snack(s) a day Patient  reports that her primary diet is: Regular Patient reports that she does have regular access to food.   Depression Screen PHQ 2/9 Scores 08/25/2019 06/05/2019 02/26/2019 01/26/2019 12/03/2018 08/19/2018 05/15/2018  PHQ - 2 Score 0 0 0 0 0 0 0     Fall Risk Fall Risk  08/25/2019 02/26/2019 01/26/2019 12/03/2018 08/19/2018  Falls in the past year? 0 0 0 0 No  Number falls in past yr: 0 - - - -  Injury with Fall? 0 - - - -  Follow up Falls prevention discussed - - - -  Comment discussed getting rid of any throw rugs, having adequate lighting in the walkways and grabrails in the bathroom - - - -     Objective:  Katelyn Katelyn seemed alert and oriented and she participated appropriately during our telephone visit.  Blood Pressure Weight BMI  BP Readings from Last 3 Encounters:  02/26/19 (!) 165/85  01/26/19 130/82  12/03/18 134/79   Wt Readings from Last 3 Encounters:  02/26/19 209 lb (94.8 kg)  01/26/19 205 lb (93 kg)  12/03/18 204 lb (92.5 kg)   BMI Readings from Last 1 Encounters:  02/26/19 37.02 kg/m    *Unable to obtain current vital signs, weight, and BMI due to telephone visit type  Hearing/Vision  . Ben did not seem to have difficulty with hearing/understanding during the telephone conversation . Reports that she has not had a formal eye exam  by an eye care professional within the past year . Reports that she has not had a formal hearing evaluation within the past year *Unable to fully assess hearing and vision during telephone visit type  Cognitive Function: 6CIT Screen 08/25/2019  What Year? 0 points  What month? 0 points  What time? 0 points  Count back from 20 0 points  Months in reverse 0 points  Repeat phrase 6 points  Total Score 6   (Normal:0-7, Significant for Dysfunction: >8)  Normal Cognitive Function Screening: Yes   Immunization & Health Maintenance Record Immunization History  Administered Date(s) Administered  . Pneumococcal Conjugate-13 11/19/2014  . Pneumococcal Polysaccharide-23 09/12/2010    Health Maintenance  Topic Date Due  . OPHTHALMOLOGY EXAM  08/24/2018  . HEMOGLOBIN A1C  06/04/2019  . INFLUENZA VACCINE  08/01/2019  . FOOT EXAM  08/20/2019  . Fecal DNA (Cologuard)  09/05/2019 (Originally 07/25/1994)  . DEXA SCAN  06/04/2020 (Originally 03/22/2017)  . TETANUS/TDAP  06/04/2020 (Originally 07/26/1963)  . Hepatitis C Screening  Completed  . PNA vac Low Risk Adult  Completed       Assessment  This is a routine wellness examination for Katelyn Katelyn.  Health Maintenance: Due or Overdue Health Maintenance Due  Topic Date Due  . OPHTHALMOLOGY EXAM  08/24/2018  . HEMOGLOBIN A1C  06/04/2019  . INFLUENZA VACCINE  08/01/2019  . FOOT EXAM  08/20/2019    Katelyn Katelyn does not need a referral for Community Assistance: Care Management:   no Social Work:    no Prescription Assistance:  no Nutrition/Diabetes Education:  no   Plan:  Personalized Goals Goals Addressed            This Visit's Progress   . DIET - INCREASE WATER INTAKE       Try to drink 6-8 glasses of water daily.      Personalized Health Maintenance & Screening Recommendations  Influenza vaccine Td vaccine Shingles vaccine  Lung Cancer Screening Recommended: no (Low Dose CT Chest recommended if Age 11-80  years, 30 pack-year currently smoking OR have quit w/in past 15 years) Hepatitis C Screening recommended: no HIV Screening recommended: no  Advanced Directives: Written information was not prepared per patient's request.  Referrals & Orders No orders of the defined types were placed in this encounter.   Follow-up Plan . Follow-up with Katelyn Katelyn, Mary-Margaret, FNP as planned . Consider Shingles, TDAP and Flu vaccines at your next visit with your PCP . Keep your eye exam appointment for next week as scheduled   I have personally reviewed and noted the following in the patient's chart:   . Medical and social history . Use of alcohol, tobacco or illicit drugs  . Current medications and supplements . Functional ability and status . Nutritional status . Physical activity . Advanced directives . List of other physicians . Hospitalizations, surgeries, and ER visits in previous 12 months . Vitals . Screenings to include cognitive, depression, and falls . Referrals and appointments  In addition, I have reviewed and discussed with Katelyn Katelyn certain preventive protocols, quality metrics, and best practice recommendations. A written personalized care plan for preventive services as well as general preventive health recommendations is available and can be mailed to the patient at her request.      Margurite AuerbachCompton, Sami Roes G, LPN  1/61/09608/25/2020  I have reviewed and agree with the above AWV documentation.   Katelyn Daphine DeutscherMartin, FNP

## 2019-08-25 NOTE — Patient Instructions (Signed)
Preventive Care 38 Years and Older, Female Preventive care refers to lifestyle choices and visits with your health care provider that can promote health and wellness. This includes:  A yearly physical exam. This is also called an annual well check.  Regular dental and eye exams.  Immunizations.  Screening for certain conditions.  Healthy lifestyle choices, such as diet and exercise. What can I expect for my preventive care visit? Physical exam Your health care provider will check:  Height and weight. These may be used to calculate body mass index (BMI), which is a measurement that tells if you are at a healthy weight.  Heart rate and blood pressure.  Your skin for abnormal spots. Counseling Your health care provider may ask you questions about:  Alcohol, tobacco, and drug use.  Emotional well-being.  Home and relationship well-being.  Sexual activity.  Eating habits.  History of falls.  Memory and ability to understand (cognition).  Work and work Statistician.  Pregnancy and menstrual history. What immunizations do I need?  Influenza (flu) vaccine  This is recommended every year. Tetanus, diphtheria, and pertussis (Tdap) vaccine  You may need a Td booster every 10 years. Varicella (chickenpox) vaccine  You may need this vaccine if you have not already been vaccinated. Zoster (shingles) vaccine  You may need this after age 33. Pneumococcal conjugate (PCV13) vaccine  One dose is recommended after age 33. Pneumococcal polysaccharide (PPSV23) vaccine  One dose is recommended after age 72. Measles, mumps, and rubella (MMR) vaccine  You may need at least 75 dose of MMR if you were born in 1957 or later. You may also need a second dose. Meningococcal conjugate (MenACWY) vaccine  You may need this if you have certain conditions. Hepatitis A vaccine  You may need this if you have certain conditions or if you travel or work in places where you may be exposed  to hepatitis A. Hepatitis B vaccine  You may need this if you have certain conditions or if you travel or work in places where you may be exposed to hepatitis B. Haemophilus influenzae type b (Hib) vaccine  You may need this if you have certain conditions. You may receive vaccines as individual doses or as more than one vaccine together in one shot (combination vaccines). Talk with your health care provider about the risks and benefits of combination vaccines. What tests do I need? Blood tests  Lipid and cholesterol levels. These may be checked every 5 years, or more frequently depending on your overall health.  Hepatitis C test.  Hepatitis B test. Screening  Lung cancer screening. You may have this screening every year starting at age 75 if you have a 30-pack-year history of smoking and currently smoke or have quit within the past 15 years.  Colorectal cancer screening. All adults should have this screening starting at age 75 and continuing until age 15. Your health care provider may recommend screening at age 23 if you are at increased risk. You will have tests every 1-10 years, depending on your results and the type of screening test.  Diabetes screening. This is done by checking your blood sugar (glucose) after you have not eaten for a while (fasting). You may have this done every 1-3 years.  Mammogram. This may be done every 1-2 years. Talk with your health care provider about how often you should have regular mammograms.  BRCA-related cancer screening. This may be done if you have a family history of breast, ovarian, tubal, or peritoneal cancers.  Other tests  Sexually transmitted disease (STD) testing.  Bone density scan. This is done to screen for osteoporosis. You may have this done starting at age 75. Follow these instructions at home: Eating and drinking  Eat a diet that includes fresh fruits and vegetables, whole grains, lean protein, and low-fat dairy products. Limit  your intake of foods with high amounts of sugar, saturated fats, and salt.  Take vitamin and mineral supplements as recommended by your health care provider.  Do not drink alcohol if your health care provider tells you not to drink.  If you drink alcohol: ? Limit how much you have to 0-1 drink a day. ? Be aware of how much alcohol is in your drink. In the [place] one drink equals one 12 oz bottle of beer (355 mL), one 5 oz glass of wine (148 mL), or one 1 oz glass of hard liquor (44 mL). Lifestyle  Take daily care of your teeth and gums.  Stay active. Exercise for at least 30 minutes on 5 or more days each week.  Do not use any products that contain nicotine or tobacco, such as cigarettes, e-cigarettes, and chewing tobacco. If you need help quitting, ask your health care provider.  If you are sexually active, practice safe sex. Use a condom or other form of protection in order to prevent STIs (sexually transmitted infections).  Talk with your health care provider about taking a low-dose aspirin or statin. What's next?  Go to your health care provider once a year for a well check visit.  Ask your health care provider how often you should have your eyes and teeth checked.  Stay up to date on all vaccines. This information is not intended to replace advice given to you by your health care provider. Make sure you discuss any questions you have with your health care provider. Document Released: 01/13/2016 Document Revised: 12/11/2018 Document Reviewed: 12/11/2018 Elsevier Patient Education  2020 [employer].

## 2019-09-08 ENCOUNTER — Encounter: Payer: Self-pay | Admitting: Nurse Practitioner

## 2019-09-08 ENCOUNTER — Other Ambulatory Visit: Payer: Self-pay

## 2019-09-08 ENCOUNTER — Ambulatory Visit (INDEPENDENT_AMBULATORY_CARE_PROVIDER_SITE_OTHER): Payer: Medicare Other

## 2019-09-08 ENCOUNTER — Ambulatory Visit (INDEPENDENT_AMBULATORY_CARE_PROVIDER_SITE_OTHER): Payer: Medicare Other | Admitting: Nurse Practitioner

## 2019-09-08 VITALS — BP 166/86 | HR 76 | Temp 98.0°F | Ht 63.0 in | Wt 208.5 lb

## 2019-09-08 DIAGNOSIS — E785 Hyperlipidemia, unspecified: Secondary | ICD-10-CM | POA: Diagnosis not present

## 2019-09-08 DIAGNOSIS — I159 Secondary hypertension, unspecified: Secondary | ICD-10-CM

## 2019-09-08 DIAGNOSIS — M1A9XX0 Chronic gout, unspecified, without tophus (tophi): Secondary | ICD-10-CM | POA: Diagnosis not present

## 2019-09-08 DIAGNOSIS — R0602 Shortness of breath: Secondary | ICD-10-CM | POA: Diagnosis not present

## 2019-09-08 DIAGNOSIS — E559 Vitamin D deficiency, unspecified: Secondary | ICD-10-CM

## 2019-09-08 DIAGNOSIS — E1142 Type 2 diabetes mellitus with diabetic polyneuropathy: Secondary | ICD-10-CM

## 2019-09-08 DIAGNOSIS — R0600 Dyspnea, unspecified: Secondary | ICD-10-CM

## 2019-09-08 DIAGNOSIS — Z6833 Body mass index (BMI) 33.0-33.9, adult: Secondary | ICD-10-CM

## 2019-09-08 DIAGNOSIS — R0609 Other forms of dyspnea: Secondary | ICD-10-CM

## 2019-09-08 DIAGNOSIS — Z1211 Encounter for screening for malignant neoplasm of colon: Secondary | ICD-10-CM

## 2019-09-08 LAB — BAYER DCA HB A1C WAIVED: HB A1C (BAYER DCA - WAIVED): 7.7 % — ABNORMAL HIGH (ref <7.0)

## 2019-09-08 MED ORDER — FUROSEMIDE 20 MG PO TABS: 20.0000 mg | ORAL_TABLET | Freq: Every day | ORAL | 3 refills | Status: DC

## 2019-09-08 MED ORDER — METFORMIN HCL 1000 MG PO TABS: 1000.0000 mg | ORAL_TABLET | Freq: Two times a day (BID) | ORAL | 1 refills | Status: DC

## 2019-09-08 NOTE — Patient Instructions (Signed)
Carbohydrate Counting for Diabetes Mellitus, Adult  Carbohydrate counting is a method of keeping track of how many carbohydrates you eat. Eating carbohydrates naturally increases the amount of sugar (glucose) in the blood. Counting how many carbohydrates you eat helps keep your blood glucose within normal limits, which helps you manage your diabetes (diabetes mellitus). It is important to know how many carbohydrates you can safely have in each meal. This is different for every person. A diet and nutrition specialist (registered dietitian) can help you make a meal plan and calculate how many carbohydrates you should have at each meal and snack. Carbohydrates are found in the following foods:  Grains, such as breads and cereals.  Dried beans and soy products.  Starchy vegetables, such as potatoes, peas, and corn.  Fruit and fruit juices.  Milk and yogurt.  Sweets and snack foods, such as cake, cookies, candy, chips, and soft drinks. How do I count carbohydrates? There are two ways to count carbohydrates in food. You can use either of the methods or a combination of both. Reading "Nutrition Facts" on packaged food The "Nutrition Facts" list is included on the labels of almost all packaged foods and beverages in the [place] It includes:  The serving size.  Information about nutrients in each serving, including the grams (g) of carbohydrate per serving. To use the "Nutrition Facts":  Decide how many servings you will have.  Multiply the number of servings by the number of carbohydrates per serving.  The resulting number is the total amount of carbohydrates that you will be having. Learning standard serving sizes of other foods When you eat carbohydrate foods that are not packaged or do not include "Nutrition Facts" on the label, you need to measure the servings in order to count the amount of carbohydrates:  Measure the foods that you will eat with a food scale or measuring cup, if needed.   Decide how many standard-size servings you will eat.  Multiply the number of servings by 15. Most carbohydrate-rich foods have about 15 g of carbohydrates per serving. ? For example, if you eat 8 oz (170 g) of strawberries, you will have eaten 2 servings and 30 g of carbohydrates (2 servings x 15 g = 30 g).  For foods that have more than one food mixed, such as soups and casseroles, you must count the carbohydrates in each food that is included. The following list contains standard serving sizes of common carbohydrate-rich foods. Each of these servings has about 15 g of carbohydrates:   hamburger bun or  English muffin.   oz (15 mL) syrup.   oz (14 g) jelly.  1 slice of bread.  1 six-inch tortilla.  3 oz (85 g) cooked rice or pasta.  4 oz (113 g) cooked dried beans.  4 oz (113 g) starchy vegetable, such as peas, corn, or potatoes.  4 oz (113 g) hot cereal.  4 oz (113 g) mashed potatoes or  of a large baked potato.  4 oz (113 g) canned or frozen fruit.  4 oz (120 mL) fruit juice.  4-6 crackers.  6 chicken nuggets.  6 oz (170 g) unsweetened dry cereal.  6 oz (170 g) plain fat-free yogurt or yogurt sweetened with artificial sweeteners.  8 oz (240 mL) milk.  8 oz (170 g) fresh fruit or one small piece of fruit.  24 oz (680 g) popped popcorn. Example of carbohydrate counting Sample meal  3 oz (85 g) chicken breast.  6 oz (170 g)   brown rice.  4 oz (113 g) corn.  8 oz (240 mL) milk.  8 oz (170 g) strawberries with sugar-free whipped topping. Carbohydrate calculation 1. Identify the foods that contain carbohydrates: ? Rice. ? Corn. ? Milk. ? Strawberries. 2. Calculate how many servings you have of each food: ? 2 servings rice. ? 1 serving corn. ? 1 serving milk. ? 1 serving strawberries. 3. Multiply each number of servings by 15 g: ? 2 servings rice x 15 g = 30 g. ? 1 serving corn x 15 g = 15 g. ? 1 serving milk x 15 g = 15 g. ? 1 serving  strawberries x 15 g = 15 g. 4. Add together all of the amounts to find the total grams of carbohydrates eaten: ? 30 g + 15 g + 15 g + 15 g = 75 g of carbohydrates total. Summary  Carbohydrate counting is a method of keeping track of how many carbohydrates you eat.  Eating carbohydrates naturally increases the amount of sugar (glucose) in the blood.  Counting how many carbohydrates you eat helps keep your blood glucose within normal limits, which helps you manage your diabetes.  A diet and nutrition specialist (registered dietitian) can help you make a meal plan and calculate how many carbohydrates you should have at each meal and snack. This information is not intended to replace advice given to you by your health care provider. Make sure you discuss any questions you have with your health care provider. Document Released: 12/17/2005 Document Revised: 07/11/2017 Document Reviewed: 05/30/2016 Elsevier Patient Education  2020 [employer]  

## 2019-09-08 NOTE — Addendum Note (Signed)
Addended by: Chevis Pretty on: 09/08/2019 11:35 AM   Modules accepted: Orders

## 2019-09-08 NOTE — Progress Notes (Addendum)
Subjective:    Patient ID: Katelyn Ballard, female    DOB: 1945-10-28, 74 y.o.   MRN: [id number]   Chief Complaint: Medical Management of Chronic Issues    HPI:  1. Secondary hypertension No c/o chest pain, or headache. Does not  check blood pressure at home. BP Readings from Last 3 Encounters:  02/26/19 (!) 165/85  01/26/19 130/82  12/03/18 134/79     2. Hyperlipidemia with target LDL less than 100 Does not watch diet and does no exercise. Lab Results  Component Value Date   CHOL 169 12/03/2018   HDL 34 (L) 12/03/2018   LDLCALC 83 12/03/2018   TRIG 262 (H) 12/03/2018   CHOLHDL 5.0 (H) 12/03/2018     3. Type 2 diabetes mellitus with diabetic polyneuropathy, without long-term current use of insulin (HCC) She does not watch diet very closely and only checks blood sugar every now and then Lab Results  Component Value Date   HGBA1C 6.7 12/03/2018     4. Chronic gout without tophus, unspecified cause, unspecified site No recent gout flare ups.  5. Vitamin D deficiency Forgets to take vitamin d supplement most days  6. BMI 33.0-33.9,adult No recent weight changes    Outpatient Encounter Medications as of 09/08/2019  Medication Sig  . allopurinol (ZYLOPRIM) 300 MG tablet Take 1 tablet (300 mg total) by mouth daily. (Needs to be seen before next refill)  . amLODipine (NORVASC) 5 MG tablet Take 1 tablet (5 mg total) by mouth daily. (Needs to be seen before next refill)  . fenofibrate 160 MG tablet Take 1 tablet (160 mg total) by mouth daily.  Marland Kitchen glimepiride (AMARYL) 4 MG tablet 1 po Qam and 1/2 po In evening  . lisinopril (ZESTRIL) 40 MG tablet Take 1 tablet (40 mg total) by mouth daily. (Needs to be seen before next refill)  . meloxicam (MOBIC) 15 MG tablet Take 1 tablet (15 mg total) by mouth daily.  . metFORMIN (GLUCOPHAGE) 1000 MG tablet TAKE 1/2 (ONE-HALF) TABLET BY MOUTH TWICE DAILY WITH MEALS (NEEDS TO BE SEEN BEFORE NEXT REFILL)  . naproxen (NAPROSYN) 500 MG  tablet TAKE 1 TABLET BY MOUTH TWICE DAILY WITH MEALS  . ONETOUCH ULTRA test strip USE 1 STRIP TO CHECK GLUCOSE TWICE DAILY TO THREE TIMES DAILY  . pravastatin (PRAVACHOL) 40 MG tablet Take 1 tablet (40 mg total) by mouth daily. (Needs to be seen before next refill)     Past Surgical History:  Procedure Laterality Date  . ABDOMINAL HYSTERECTOMY      Family History  Problem Relation Age of Onset  . Diabetes Mother   . Heart disease Mother   . Kidney disease Mother        blockage of renal artery  . Dementia Mother   . Heart disease Father   . Heart disease Sister   . Diabetes Sister   . Diabetes Sister     New complaints: Has been having SB with activity for greater then 6 months  Social history: Lives by herself- her son lives next door and checks on her daily  Controlled substance contract: n/a    Review of Systems  Constitutional: Negative for activity change and appetite change.  HENT: Negative.   Eyes: Negative for pain.  Respiratory: Negative for shortness of breath.   Cardiovascular: Negative for chest pain, palpitations and leg swelling.  Gastrointestinal: Negative for abdominal pain.  Endocrine: Negative for polydipsia.  Genitourinary: Negative.   Skin: Negative for rash.  Neurological:  Negative for dizziness, weakness and headaches.  Hematological: Does not bruise/bleed easily.  Psychiatric/Behavioral: Negative.   All other systems reviewed and are negative.      Objective:   Physical Exam Vitals signs and nursing note reviewed.  Constitutional:      General: She is not in acute distress.    Appearance: Normal appearance. She is well-developed.  HENT:     Head: Normocephalic.     Nose: Nose normal.  Eyes:     Pupils: Pupils are equal, round, and reactive to light.  Neck:     Musculoskeletal: Normal range of motion and neck supple.     Vascular: No carotid bruit or JVD.  Cardiovascular:     Rate and Rhythm: Normal rate and regular rhythm.      Heart sounds: Normal heart sounds.  Pulmonary:     Effort: Pulmonary effort is normal. No respiratory distress.     Breath sounds: Normal breath sounds. No wheezing or rales.  Chest:     Chest wall: No tenderness.  Abdominal:     General: Bowel sounds are normal. There is no distension or abdominal bruit.     Palpations: Abdomen is soft. There is no hepatomegaly, splenomegaly, mass or pulsatile mass.     Tenderness: There is no abdominal tenderness.  Musculoskeletal: Normal range of motion.  Lymphadenopathy:     Cervical: No cervical adenopathy.  Skin:    General: Skin is warm and dry.  Neurological:     Mental Status: She is alert and oriented to person, place, and time.     Deep Tendon Reflexes: Reflexes are normal and symmetric.  Psychiatric:        Behavior: Behavior normal.        Thought Content: Thought content normal.        Judgment: Judgment normal.     BP (!) 166/86 (BP Location: Left Arm, Cuff Size: Normal)   Pulse 76   Temp 98 F (36.7 C) (Oral)   Ht '5\' 3"'$  (1.6 m)   Wt 208 lb 8 oz (94.6 kg)   SpO2 96%   BMI 36.93 kg/m   hgba1c 7.7%    EKG- NSR- Mary-Margaret Hassell Done, FNP Chest - possible CHf-Preliminary reading by Ronnald Collum, FNP  [facility]  Assessment & Plan:  Katelyn Ballard comes in today with chief complaint of Medical Management of Chronic Issues   Diagnosis and orders addressed:  1. Secondary hypertension Low fat diet - CMP14+EGFR - Lipid panel - EKG 12-Lead - DG Chest 2 View; Future  2. Hyperlipidemia with target LDL less than 100 Low fat diet - CMP14+EGFR - Lipid panel  3. Type 2 diabetes mellitus with diabetic polyneuropathy, without long-term current use of insulin (HCC) Stricter carb counting - if worsenes will need to add new meds - CMP14+EGFR - Lipid panel - Bayer DCA Hb A1c Waived - metFORMIN (GLUCOPHAGE) 1000 MG tablet; Take 1 tablet (1,000 mg total) by mouth 2 (two) times daily with a meal.  Dispense: 180 tablet; Refill: 1  4.  Chronic gout without tophus, unspecified cause, unspecified site Low purine diet  5. Vitamin D deficiency Daily vitamin d supplement  6. BMI 33.0-33.9,adult Discussed diet and exercise for person with BMI >25 Will recheck weight in 3-6 months  7. Screening for colon cancer - Cologuard  8. Doe Needs to follow up with cardiology Lasix '20mg'$  1 po daily #90 1 refill  Labs pending Health Maintenance reviewed Diet and exercise encouraged  Follow up plan: 3  months   Mary-Margaret Hassell Done, FNP

## 2019-09-09 LAB — CMP14+EGFR
ALT: 24 IU/L (ref 0–32)
AST: 12 IU/L (ref 0–40)
Albumin/Globulin Ratio: 1.5 (ref 1.2–2.2)
Albumin: 3.9 g/dL (ref 3.7–4.7)
Alkaline Phosphatase: 65 IU/L (ref 39–117)
BUN/Creatinine Ratio: 13 (ref 12–28)
BUN: 13 mg/dL (ref 8–27)
Bilirubin Total: 0.3 mg/dL (ref 0.0–1.2)
CO2: 22 mmol/L (ref 20–29)
Calcium: 9.3 mg/dL (ref 8.7–10.3)
Chloride: 104 mmol/L (ref 96–106)
Creatinine, Ser: 1.03 mg/dL — ABNORMAL HIGH (ref 0.57–1.00)
GFR calc Af Amer: 61 mL/min/{1.73_m2} (ref >59)
GFR calc non Af Amer: 53 mL/min/{1.73_m2} — ABNORMAL LOW (ref >59)
Globulin, Total: 2.6 g/dL (ref 1.5–4.5)
Glucose: 186 mg/dL — ABNORMAL HIGH (ref 65–99)
Potassium: 4.3 mmol/L (ref 3.5–5.2)
Sodium: 142 mmol/L (ref 134–144)
Total Protein: 6.5 g/dL (ref 6.0–8.5)

## 2019-09-09 LAB — LIPID PANEL
Chol/HDL Ratio: 5.3 ratio — ABNORMAL HIGH (ref 0.0–4.4)
Cholesterol, Total: 170 mg/dL (ref 100–199)
HDL: 32 mg/dL — ABNORMAL LOW (ref >39)
LDL Chol Calc (NIH): 99 mg/dL (ref 0–99)
Triglycerides: 230 mg/dL — ABNORMAL HIGH (ref 0–149)
VLDL Cholesterol Cal: 39 mg/dL (ref 5–40)

## 2019-09-11 ENCOUNTER — Encounter: Payer: Self-pay | Admitting: *Deleted

## 2019-09-21 DIAGNOSIS — H25811 Combined forms of age-related cataract, right eye: Secondary | ICD-10-CM | POA: Diagnosis not present

## 2019-09-21 DIAGNOSIS — Z01818 Encounter for other preprocedural examination: Secondary | ICD-10-CM | POA: Diagnosis not present

## 2019-09-21 DIAGNOSIS — H2511 Age-related nuclear cataract, right eye: Secondary | ICD-10-CM | POA: Diagnosis not present

## 2019-10-05 DIAGNOSIS — H25812 Combined forms of age-related cataract, left eye: Secondary | ICD-10-CM | POA: Diagnosis not present

## 2019-10-05 DIAGNOSIS — H2512 Age-related nuclear cataract, left eye: Secondary | ICD-10-CM | POA: Diagnosis not present

## 2019-10-05 LAB — HM DIABETES EYE EXAM

## 2019-10-20 ENCOUNTER — Telehealth: Payer: Self-pay | Admitting: Nurse Practitioner

## 2019-10-20 DIAGNOSIS — M1A9XX0 Chronic gout, unspecified, without tophus (tophi): Secondary | ICD-10-CM

## 2019-10-20 MED ORDER — ALLOPURINOL 300 MG PO TABS: 300.0000 mg | ORAL_TABLET | Freq: Every day | ORAL | 1 refills | Status: DC

## 2019-10-20 NOTE — Telephone Encounter (Signed)
Patient aware 90 day supply sent in.

## 2019-11-05 ENCOUNTER — Other Ambulatory Visit: Payer: Self-pay | Admitting: Nurse Practitioner

## 2019-11-05 DIAGNOSIS — E1142 Type 2 diabetes mellitus with diabetic polyneuropathy: Secondary | ICD-10-CM

## 2019-11-16 ENCOUNTER — Other Ambulatory Visit: Payer: Self-pay | Admitting: Nurse Practitioner

## 2019-12-08 ENCOUNTER — Ambulatory Visit (INDEPENDENT_AMBULATORY_CARE_PROVIDER_SITE_OTHER): Payer: Medicare Other | Admitting: Nurse Practitioner

## 2019-12-08 ENCOUNTER — Encounter: Payer: Self-pay | Admitting: Nurse Practitioner

## 2019-12-08 DIAGNOSIS — E785 Hyperlipidemia, unspecified: Secondary | ICD-10-CM | POA: Diagnosis not present

## 2019-12-08 DIAGNOSIS — E559 Vitamin D deficiency, unspecified: Secondary | ICD-10-CM | POA: Diagnosis not present

## 2019-12-08 DIAGNOSIS — R0609 Other forms of dyspnea: Secondary | ICD-10-CM

## 2019-12-08 DIAGNOSIS — M1A9XX0 Chronic gout, unspecified, without tophus (tophi): Secondary | ICD-10-CM | POA: Diagnosis not present

## 2019-12-08 DIAGNOSIS — E1142 Type 2 diabetes mellitus with diabetic polyneuropathy: Secondary | ICD-10-CM

## 2019-12-08 DIAGNOSIS — I159 Secondary hypertension, unspecified: Secondary | ICD-10-CM | POA: Diagnosis not present

## 2019-12-08 DIAGNOSIS — Z6833 Body mass index (BMI) 33.0-33.9, adult: Secondary | ICD-10-CM

## 2019-12-08 DIAGNOSIS — R0600 Dyspnea, unspecified: Secondary | ICD-10-CM

## 2019-12-08 MED ORDER — FUROSEMIDE 20 MG PO TABS: 20.0000 mg | ORAL_TABLET | Freq: Every day | ORAL | 3 refills | Status: DC

## 2019-12-08 MED ORDER — GLIMEPIRIDE 4 MG PO TABS: ORAL_TABLET | ORAL | 0 refills | Status: DC

## 2019-12-08 MED ORDER — PRAVASTATIN SODIUM 40 MG PO TABS: 40.0000 mg | ORAL_TABLET | Freq: Every day | ORAL | 5 refills | Status: DC

## 2019-12-08 MED ORDER — ALLOPURINOL 300 MG PO TABS: 300.0000 mg | ORAL_TABLET | Freq: Every day | ORAL | 1 refills | Status: DC

## 2019-12-08 MED ORDER — AMLODIPINE BESYLATE 5 MG PO TABS: 5.0000 mg | ORAL_TABLET | Freq: Every day | ORAL | 5 refills | Status: DC

## 2019-12-08 MED ORDER — METFORMIN HCL 1000 MG PO TABS: 1000.0000 mg | ORAL_TABLET | Freq: Two times a day (BID) | ORAL | 1 refills | Status: DC

## 2019-12-08 MED ORDER — LISINOPRIL 40 MG PO TABS: 40.0000 mg | ORAL_TABLET | Freq: Every day | ORAL | 5 refills | Status: DC

## 2019-12-08 MED ORDER — FENOFIBRATE 160 MG PO TABS: 160.0000 mg | ORAL_TABLET | Freq: Every day | ORAL | 5 refills | Status: DC

## 2019-12-08 NOTE — Progress Notes (Signed)
Virtual Visit via telephone Note Due to COVID-19 pandemic this visit was conducted virtually. This visit type was conducted due to national recommendations for restrictions regarding the COVID-19 Pandemic (e.g. social distancing, sheltering in place) in an effort to limit this patient's exposure and mitigate transmission in our community. All issues noted in this document were discussed and addressed.  A physical exam was not performed with this format.  I connected with Katelyn Ballard on 12/08/19 at 9:40 by telephone and verified that I am speaking with the correct person using two identifiers. Katelyn Ballard is currently located at home and no one is currently with  her during visit. The provider, Mary-Margaret Hassell Done, FNP is located in their office at time of visit.  I discussed the limitations, risks, security and privacy concerns of performing an evaluation and management service by telephone and the availability of in person appointments. I also discussed with the patient that there may be a patient responsible charge related to this service. The patient expressed understanding and agreed to proceed.   History and Present Illness:   Chief Complaint: Medical Management of Chronic Issues    HPI:  1. Secondary hypertension No c/o chest pain, sob or headache. Does not check blood pressure at home. BP Readings from Last 3 Encounters:  09/08/19 (!) 166/86  02/26/19 (!) 165/85  01/26/19 130/82     2. Hyperlipidemia with target LDL less than 100 Does not watch diet and does o exercise. Lab Results  Component Value Date   CHOL 170 09/08/2019   HDL 32 (L) 09/08/2019   LDLCALC 99 09/08/2019   TRIG 230 (H) 09/08/2019   CHOLHDL 5.3 (H) 09/08/2019     3. Type 2 diabetes mellitus with diabetic polyneuropathy, without long-term current use of insulin (HCC) Fasting blood sugars are running around 130-over 200. She does not watch diet. Lab Results  Component Value Date   HGBA1C  7.7 (H) 09/08/2019     4. Chronic gout without tophus, unspecified cause, unspecified site Has not had any recent flare ups. sya sit has been over a year since she had flare up.  5. Vitamin D deficiency she takes a daily vitamin d suplement  6. SOB only happens when she is to active. She takes daily lasix which helps. probably has some CHF.  7. BMI 33.0-33.9,adult patient says she has gained weight but has not weighed Wt Readings from Last 3 Encounters:  09/08/19 208 lb 8 oz (94.6 kg)  02/26/19 209 lb (94.8 kg)  01/26/19 205 lb (93 kg)   BMI Readings from Last 3 Encounters:  09/08/19 36.93 kg/m  02/26/19 37.02 kg/m  01/26/19 36.31 kg/m       Outpatient Encounter Medications as of 12/08/2019  Medication Sig  . allopurinol (ZYLOPRIM) 300 MG tablet Take 1 tablet (300 mg total) by mouth daily. (Needs to be seen before next refill)  . amLODipine (NORVASC) 5 MG tablet Take 1 tablet (5 mg total) by mouth daily. (Needs to be seen before next refill)  . fenofibrate 160 MG tablet Take 1 tablet (160 mg total) by mouth daily.  . furosemide (LASIX) 20 MG tablet Take 1 tablet (20 mg total) by mouth daily.  Marland Kitchen glimepiride (AMARYL) 4 MG tablet TAKE 1 TABLET BY MOUTH IN THE MORNING AND 1/2 (ONE-HALF) IN THE EVENING  . glucose blood (ONETOUCH ULTRA) test strip Test glucose 2-3 times daily Dx E11.9  . lisinopril (ZESTRIL) 40 MG tablet Take 1 tablet (40 mg total) by mouth  daily. (Needs to be seen before next refill)  . metFORMIN (GLUCOPHAGE) 1000 MG tablet Take 1 tablet (1,000 mg total) by mouth 2 (two) times daily with a meal.  . pravastatin (PRAVACHOL) 40 MG tablet Take 1 tablet (40 mg total) by mouth daily. (Needs to be seen before next refill)     Past Surgical History:  Procedure Laterality Date  . ABDOMINAL HYSTERECTOMY      Family History  Problem Relation Age of Onset  . Diabetes Mother   . Heart disease Mother   . Kidney disease Mother        blockage of renal artery  .  Dementia Mother   . Heart disease Father   . Heart disease Sister   . Diabetes Sister   . Diabetes Sister     New complaints: Says her energy level is decreasing. Just cant go like she use to.  Social history: Lives by herself and family checks on her daily. Has a son that is on drugs that scares her sometimes.  Controlled substance contract: n/a     Review of Systems  Constitutional: Negative for diaphoresis and weight loss.  Eyes: Negative for blurred vision, double vision and pain.  Respiratory: Positive for shortness of breath.   Cardiovascular: Positive for leg swelling. Negative for chest pain, palpitations and orthopnea.  Gastrointestinal: Negative for abdominal pain.  Skin: Negative for rash.  Neurological: Negative for dizziness, sensory change, loss of consciousness, weakness and headaches.  Endo/Heme/Allergies: Negative for polydipsia. Does not bruise/bleed easily.  Psychiatric/Behavioral: Negative for memory loss. The patient does not have insomnia.   All other systems reviewed and are negative.    Observations/Objective: Alert and oriented- answers all questions appropriately No distress    Assessment and Plan: Katelyn Ballard comes in today with chief complaint of Medical Management of Chronic Issues   Diagnosis and orders addressed:  1. Secondary hypertension Low sodium diet - CMP14+EGFR; Future - amLODipine (NORVASC) 5 MG tablet; Take 1 tablet (5 mg total) by mouth daily. (Needs to be seen before next refill)  Dispense: 30 tablet; Refill: 5 - lisinopril (ZESTRIL) 40 MG tablet; Take 1 tablet (40 mg total) by mouth daily. (Needs to be seen before next refill)  Dispense: 30 tablet; Refill: 5  2. Hyperlipidemia with target LDL less than 100 Low fta diet - Lipid panel; Future - pravastatin (PRAVACHOL) 40 MG tablet; Take 1 tablet (40 mg total) by mouth daily. (Needs to be seen before next refill)  Dispense: 30 tablet; Refill: 5 - fenofibrate 160 MG  tablet; Take 1 tablet (160 mg total) by mouth daily.  Dispense: 30 tablet; Refill: 5  3. Type 2 diabetes mellitus with diabetic polyneuropathy, without long-term current use of insulin ([place]) Stricter carb counting - hgba1c; Future - glimepiride (AMARYL) 4 MG tablet; TAKE 1 TABLET BY MOUTH IN THE MORNING AND 1/2 (ONE-HALF) IN THE EVENING  Dispense: 135 tablet; Refill: 0 - metFORMIN (GLUCOPHAGE) 1000 MG tablet; Take 1 tablet (1,000 mg total) by mouth 2 (two) times daily with a meal.  Dispense: 180 tablet; Refill: 1  4. Chronic gout without tophus, unspecified cause, unspecified site Low purine diet - allopurinol (ZYLOPRIM) 300 MG tablet; Take 1 tablet (300 mg total) by mouth daily. (Needs to be seen before next refill)  Dispense: 90 tablet; Refill: 1  5. Vitamin D deficiency Continue daily vitamin d supplement  6. BMI 33.0-33.9,adult Discussed diet and exercise for person with BMI >25 Will recheck weight in 3-6 months  7.  DOE (dyspnea on exertion) rest - furosemide (LASIX) 20 MG tablet; Take 1 tablet (20 mg total) by mouth daily.  Dispense: 30 tablet; Refill: 3   Patient will come in and have labs drawn Health Maintenance reviewed Diet and exercise encouraged  Follow up plan: 3 months      I discussed the assessment and treatment plan with the patient. The patient was provided an opportunity to ask questions and all were answered. The patient agreed with the plan and demonstrated an understanding of the instructions.   The patient was advised to call back or seek an in-person evaluation if the symptoms worsen or if the condition fails to improve as anticipated.  The above assessment and management plan was discussed with the patient. The patient verbalized understanding of and has agreed to the management plan. Patient is aware to call the clinic if symptoms persist or worsen. Patient is aware when to return to the clinic for a follow-up visit. Patient educated on when it is  appropriate to go to the emergency department.   Time call ended:  9:52  I provided 12 minutes of non-face-to-face time during this encounter.    Mary-Margaret Hassell Done, FNP

## 2020-03-10 ENCOUNTER — Ambulatory Visit (INDEPENDENT_AMBULATORY_CARE_PROVIDER_SITE_OTHER): Payer: Medicare Other | Admitting: Nurse Practitioner

## 2020-03-10 ENCOUNTER — Encounter: Payer: Self-pay | Admitting: Nurse Practitioner

## 2020-03-10 DIAGNOSIS — E559 Vitamin D deficiency, unspecified: Secondary | ICD-10-CM

## 2020-03-10 DIAGNOSIS — E785 Hyperlipidemia, unspecified: Secondary | ICD-10-CM | POA: Diagnosis not present

## 2020-03-10 DIAGNOSIS — I10 Essential (primary) hypertension: Secondary | ICD-10-CM | POA: Diagnosis not present

## 2020-03-10 DIAGNOSIS — I159 Secondary hypertension, unspecified: Secondary | ICD-10-CM

## 2020-03-10 DIAGNOSIS — M1A9XX0 Chronic gout, unspecified, without tophus (tophi): Secondary | ICD-10-CM | POA: Diagnosis not present

## 2020-03-10 DIAGNOSIS — E1142 Type 2 diabetes mellitus with diabetic polyneuropathy: Secondary | ICD-10-CM

## 2020-03-10 DIAGNOSIS — R0609 Other forms of dyspnea: Secondary | ICD-10-CM

## 2020-03-10 DIAGNOSIS — Z6833 Body mass index (BMI) 33.0-33.9, adult: Secondary | ICD-10-CM

## 2020-03-10 DIAGNOSIS — R0600 Dyspnea, unspecified: Secondary | ICD-10-CM

## 2020-03-10 MED ORDER — PRAVASTATIN SODIUM 40 MG PO TABS: 40.0000 mg | ORAL_TABLET | Freq: Every day | ORAL | 1 refills | Status: DC

## 2020-03-10 MED ORDER — FENOFIBRATE 160 MG PO TABS: 160.0000 mg | ORAL_TABLET | Freq: Every day | ORAL | 1 refills | Status: DC

## 2020-03-10 MED ORDER — GLIMEPIRIDE 4 MG PO TABS: ORAL_TABLET | ORAL | 1 refills | Status: DC

## 2020-03-10 MED ORDER — AMLODIPINE BESYLATE 5 MG PO TABS: 5.0000 mg | ORAL_TABLET | Freq: Every day | ORAL | 1 refills | Status: DC

## 2020-03-10 MED ORDER — METFORMIN HCL 1000 MG PO TABS: 1000.0000 mg | ORAL_TABLET | Freq: Two times a day (BID) | ORAL | 1 refills | Status: DC

## 2020-03-10 MED ORDER — LISINOPRIL 40 MG PO TABS: 40.0000 mg | ORAL_TABLET | Freq: Every day | ORAL | 1 refills | Status: DC

## 2020-03-10 MED ORDER — FUROSEMIDE 20 MG PO TABS: 20.0000 mg | ORAL_TABLET | Freq: Every day | ORAL | 1 refills | Status: DC

## 2020-03-10 NOTE — Progress Notes (Signed)
Virtual Visit via telephone Note Due to COVID-19 pandemic this visit was conducted virtually. This visit type was conducted due to national recommendations for restrictions regarding the COVID-19 Pandemic (e.g. social distancing, sheltering in place) in an effort to limit this patient's exposure and mitigate transmission in our community. All issues noted in this document were discussed and addressed.  A physical exam was not performed with this format.  I connected with Katelyn Ballard on 03/10/20 at 11:00 by telephone and verified that I am speaking with the correct person using two identifiers. Katelyn Ballard is currently located at home and no one is currently with her during visit. The provider, Mary-Margaret Hassell Done, FNP is located in their office at time of visit.  I discussed the limitations, risks, security and privacy concerns of performing an evaluation and management service by telephone and the availability of in person appointments. I also discussed with the patient that there may be a patient responsible charge related to this service. The patient expressed understanding and agreed to proceed.   History and Present Illness:   Chief Complaint: Medical Management of Chronic Issues    HPI:  1. Essential hypertension No c/o chest pain,  Has occasional SOB with exertion. or headache. Does not check blood pressure at home. BP Readings from Last 3 Encounters:  09/08/19 (!) 166/86  02/26/19 (!) 165/85  01/26/19 130/82     2. Hyperlipidemia with target LDL less than 100 Does not watchdit and does very little exercise. Lab Results  Component Value Date   CHOL 170 09/08/2019   HDL 32 (L) 09/08/2019   LDLCALC 99 09/08/2019   TRIG 230 (H) 09/08/2019   CHOLHDL 5.3 (H) 09/08/2019     3. Type 2 diabetes mellitus with diabetic polyneuropathy, without long-term current use of insulin ([place]) Patient has not been checking blood sugars. Says that her monitor broke. Lab Results    Component Value Date   HGBA1C 7.7 (H) 09/08/2019     4. Chronic gout without tophus, unspecified cause, unspecified site No recent flare ups  5. Vitamin D deficiency Takes daily vitamin d supplement  6. BMI 33.0-33.9,adult No recent weight changes Wt Readings from Last 3 Encounters:  09/08/19 208 lb 8 oz (94.6 kg)  02/26/19 209 lb (94.8 kg)  01/26/19 205 lb (93 kg)   BMI Readings from Last 3 Encounters:  09/08/19 36.93 kg/m  02/26/19 37.02 kg/m  01/26/19 36.31 kg/m       Outpatient Encounter Medications as of 03/10/2020  Medication Sig  . allopurinol (ZYLOPRIM) 300 MG tablet Take 1 tablet (300 mg total) by mouth daily. (Needs to be seen before next refill)  . amLODipine (NORVASC) 5 MG tablet Take 1 tablet (5 mg total) by mouth daily. (Needs to be seen before next refill)  . fenofibrate 160 MG tablet Take 1 tablet (160 mg total) by mouth daily.  . furosemide (LASIX) 20 MG tablet Take 1 tablet (20 mg total) by mouth daily.  Marland Kitchen glimepiride (AMARYL) 4 MG tablet TAKE 1 TABLET BY MOUTH IN THE MORNING AND 1/2 (ONE-HALF) IN THE EVENING  . glucose blood (ONETOUCH ULTRA) test strip Test glucose 2-3 times daily Dx E11.9  . lisinopril (ZESTRIL) 40 MG tablet Take 1 tablet (40 mg total) by mouth daily. (Needs to be seen before next refill)  . metFORMIN (GLUCOPHAGE) 1000 MG tablet Take 1 tablet (1,000 mg total) by mouth 2 (two) times daily with a meal.  . pravastatin (PRAVACHOL) 40 MG tablet Take 1  tablet (40 mg total) by mouth daily. (Needs to be seen before next refill)     Past Surgical History:  Procedure Laterality Date  . ABDOMINAL HYSTERECTOMY      Family History  Problem Relation Age of Onset  . Diabetes Mother   . Heart disease Mother   . Kidney disease Mother        blockage of renal artery  . Dementia Mother   . Heart disease Father   . Heart disease Sister   . Diabetes Sister   . Diabetes Sister     New complaints: none today  Social history: Lives by  herself  Controlled substance contract: n/a    Review of Systems  Constitutional: Negative for diaphoresis and weight loss.  Eyes: Negative for blurred vision, double vision and pain.  Respiratory: Negative for shortness of breath.   Cardiovascular: Negative for chest pain, palpitations, orthopnea and leg swelling.  Gastrointestinal: Negative for abdominal pain.  Skin: Negative for rash.  Neurological: Negative for dizziness, sensory change, loss of consciousness, weakness and headaches.  Endo/Heme/Allergies: Negative for polydipsia. Does not bruise/bleed easily.  Psychiatric/Behavioral: Negative for memory loss. The patient does not have insomnia.   All other systems reviewed and are negative.    Observations/Objective: Alert and oriented- answers all questions appropriately No distress No SOB while talking on phone   Assessment and Plan: [name] comes in today with chief complaint of Medical Management of Chronic Issues   Diagnosis and orders addressed:  1. Essential hypertension Low sodium diet - CBC with Differential/Platelet; Future - CMP14+EGFR; Future - amLODipine (NORVASC) 5 MG tablet; Take 1 tablet (5 mg total) by mouth daily. (Needs to be seen before next refill)  Dispense: 90 tablet; Refill: 1 - lisinopril (ZESTRIL) 40 MG tablet; Take 1 tablet (40 mg total) by mouth daily. (Needs to be seen before next refill)  Dispense: 90 tablet; Refill: 1   2. Hyperlipidemia with target LDL less than 100 Low fat diet - Lipid panel; Future - pravastatin (PRAVACHOL) 40 MG tablet; Take 1 tablet (40 mg total) by mouth daily. (Needs to be seen before next refill)  Dispense: 90 tablet; Refill: 1 - fenofibrate 160 MG tablet; Take 1 tablet (160 mg total) by mouth daily.  Dispense: 90 tablet; Refill: 1  3. Type 2 diabetes mellitus with diabetic polyneuropathy, without long-term current use of insulin ([facility]) Strict carb counting - Bayer DCA Hb A1c Waived; Future -  metFORMIN (GLUCOPHAGE) 1000 MG tablet; Take 1 tablet (1,000 mg total) by mouth 2 (two) times daily with a meal.  Dispense: 180 tablet; Refill: 1 - glimepiride (AMARYL) 4 MG tablet; TAKE 1 TABLET BY MOUTH IN THE MORNING AND 1/2 (ONE-HALF) IN THE EVENING  Dispense: 135 tablet; Refill: 1  4. Chronic gout without tophus, unspecified cause, unspecified site Low purine diet  5. Vitamin D deficiency Daily vitamin d supplement  6. BMI 33.0-33.9,adult Discussed diet and exercise for person with BMI >25 Will recheck weight in 3-6 months  7. Dyspnea on exertion Referral to cardiology Encouraged patient to not cancel appointment once made - Ambulatory referral to Cardiology - furosemide (LASIX) 20 MG tablet; Take 1 tablet (20 mg total) by mouth daily.  Dispense: 90 tablet; Refill: 1   Labs pending Health Maintenance reviewed Diet and exercise encouraged  Follow up plan: 3 months     I discussed the assessment and treatment plan with the patient. The patient was provided an opportunity to ask questions and all were answered.  The patient agreed with the plan and demonstrated an understanding of the instructions.   The patient was advised to call back or seek an in-person evaluation if the symptoms worsen or if the condition fails to improve as anticipated.  The above assessment and management plan was discussed with the patient. The patient verbalized understanding of and has agreed to the management plan. Patient is aware to call the clinic if symptoms persist or worsen. Patient is aware when to return to the clinic for a follow-up visit. Patient educated on when it is appropriate to go to the emergency department.   Time call ended:  11:17  I provided 17 minutes of non-face-to-face time during this encounter.    Mary-Margaret Hassell Done, FNP

## 2020-03-11 ENCOUNTER — Other Ambulatory Visit: Payer: Self-pay | Admitting: *Deleted

## 2020-03-11 ENCOUNTER — Other Ambulatory Visit: Payer: Medicare Other

## 2020-03-11 ENCOUNTER — Other Ambulatory Visit: Payer: Self-pay

## 2020-03-11 DIAGNOSIS — E1142 Type 2 diabetes mellitus with diabetic polyneuropathy: Secondary | ICD-10-CM | POA: Diagnosis not present

## 2020-03-11 DIAGNOSIS — I10 Essential (primary) hypertension: Secondary | ICD-10-CM

## 2020-03-11 DIAGNOSIS — E785 Hyperlipidemia, unspecified: Secondary | ICD-10-CM | POA: Diagnosis not present

## 2020-03-11 LAB — BAYER DCA HB A1C WAIVED: HB A1C (BAYER DCA - WAIVED): 9.3 % — ABNORMAL HIGH (ref <7.0)

## 2020-03-11 MED ORDER — BLOOD GLUCOSE METER KIT: PACK | 0 refills | Status: AC

## 2020-03-12 LAB — CBC WITH DIFFERENTIAL/PLATELET
Basophils Absolute: 0.1 10*3/uL (ref 0.0–0.2)
Basos: 1 %
EOS (ABSOLUTE): 0.4 10*3/uL (ref 0.0–0.4)
Eos: 5 %
Hematocrit: 38.2 % (ref 34.0–46.6)
Hemoglobin: 12.1 g/dL (ref 11.1–15.9)
Immature Grans (Abs): 0 10*3/uL (ref 0.0–0.1)
Immature Granulocytes: 1 %
Lymphocytes Absolute: 2.7 10*3/uL (ref 0.7–3.1)
Lymphs: 31 %
MCH: 26.1 pg — ABNORMAL LOW (ref 26.6–33.0)
MCHC: 31.7 g/dL (ref 31.5–35.7)
MCV: 83 fL (ref 79–97)
Monocytes Absolute: 0.6 10*3/uL (ref 0.1–0.9)
Monocytes: 7 %
Neutrophils Absolute: 4.8 10*3/uL (ref 1.4–7.0)
Neutrophils: 55 %
Platelets: 346 10*3/uL (ref 150–450)
RBC: 4.63 x10E6/uL (ref 3.77–5.28)
RDW: 14.9 % (ref 11.7–15.4)
WBC: 8.5 10*3/uL (ref 3.4–10.8)

## 2020-03-12 LAB — LIPID PANEL
Chol/HDL Ratio: 4.8 ratio — ABNORMAL HIGH (ref 0.0–4.4)
Cholesterol, Total: 152 mg/dL (ref 100–199)
HDL: 32 mg/dL — ABNORMAL LOW (ref >39)
LDL Chol Calc (NIH): 84 mg/dL (ref 0–99)
Triglycerides: 213 mg/dL — ABNORMAL HIGH (ref 0–149)
VLDL Cholesterol Cal: 36 mg/dL (ref 5–40)

## 2020-03-12 LAB — CMP14+EGFR
ALT: 18 IU/L (ref 0–32)
AST: 13 IU/L (ref 0–40)
Albumin/Globulin Ratio: 1.2 (ref 1.2–2.2)
Albumin: 3.8 g/dL (ref 3.7–4.7)
Alkaline Phosphatase: 66 IU/L (ref 39–117)
BUN/Creatinine Ratio: 25 (ref 12–28)
BUN: 25 mg/dL (ref 8–27)
Bilirubin Total: 0.3 mg/dL (ref 0.0–1.2)
CO2: 20 mmol/L (ref 20–29)
Calcium: 9.8 mg/dL (ref 8.7–10.3)
Chloride: 106 mmol/L (ref 96–106)
Creatinine, Ser: 1.02 mg/dL — ABNORMAL HIGH (ref 0.57–1.00)
GFR calc Af Amer: 62 mL/min/{1.73_m2} (ref >59)
GFR calc non Af Amer: 54 mL/min/{1.73_m2} — ABNORMAL LOW (ref >59)
Globulin, Total: 3.3 g/dL (ref 1.5–4.5)
Glucose: 183 mg/dL — ABNORMAL HIGH (ref 65–99)
Potassium: 4.1 mmol/L (ref 3.5–5.2)
Sodium: 143 mmol/L (ref 134–144)
Total Protein: 7.1 g/dL (ref 6.0–8.5)

## 2020-03-30 ENCOUNTER — Other Ambulatory Visit: Payer: Self-pay | Admitting: Nurse Practitioner

## 2020-03-30 DIAGNOSIS — E785 Hyperlipidemia, unspecified: Secondary | ICD-10-CM

## 2020-04-26 ENCOUNTER — Other Ambulatory Visit: Payer: Self-pay | Admitting: Nurse Practitioner

## 2020-04-26 DIAGNOSIS — M1A9XX0 Chronic gout, unspecified, without tophus (tophi): Secondary | ICD-10-CM

## 2020-05-04 ENCOUNTER — Ambulatory Visit: Payer: Self-pay | Admitting: Cardiology

## 2020-06-15 ENCOUNTER — Ambulatory Visit: Payer: Self-pay | Admitting: Nurse Practitioner

## 2020-08-29 ENCOUNTER — Ambulatory Visit (INDEPENDENT_AMBULATORY_CARE_PROVIDER_SITE_OTHER): Payer: Medicare Other | Admitting: *Deleted

## 2020-08-29 ENCOUNTER — Other Ambulatory Visit: Payer: Self-pay

## 2020-08-29 DIAGNOSIS — Z0000 Encounter for general adult medical examination without abnormal findings: Secondary | ICD-10-CM | POA: Diagnosis not present

## 2020-08-29 NOTE — Patient Instructions (Signed)
Preventive Care 38 Years and Older, Female Preventive care refers to lifestyle choices and visits with your health care provider that can promote health and wellness. This includes:  A yearly physical exam. This is also called an annual well check.  Regular dental and eye exams.  Immunizations.  Screening for certain conditions.  Healthy lifestyle choices, such as diet and exercise. What can I expect for my preventive care visit? Physical exam Your health care provider will check:  Height and weight. These may be used to calculate body mass index (BMI), which is a measurement that tells if you are at a healthy weight.  Heart rate and blood pressure.  Your skin for abnormal spots. Counseling Your health care provider may ask you questions about:  Alcohol, tobacco, and drug use.  Emotional well-being.  Home and relationship well-being.  Sexual activity.  Eating habits.  History of falls.  Memory and ability to understand (cognition).  Work and work Statistician.  Pregnancy and menstrual history. What immunizations do I need?  Influenza (flu) vaccine  This is recommended every year. Tetanus, diphtheria, and pertussis (Tdap) vaccine  You may need a Td booster every 10 years. Varicella (chickenpox) vaccine  You may need this vaccine if you have not already been vaccinated. Zoster (shingles) vaccine  You may need this after age 33. Pneumococcal conjugate (PCV13) vaccine  One dose is recommended after age 33. Pneumococcal polysaccharide (PPSV23) vaccine  One dose is recommended after age 72. Measles, mumps, and rubella (MMR) vaccine  You may need at least one dose of MMR if you were born in 1957 or later. You may also need a second dose. Meningococcal conjugate (MenACWY) vaccine  You may need this if you have certain conditions. Hepatitis A vaccine  You may need this if you have certain conditions or if you travel or work in places where you may be exposed  to hepatitis A. Hepatitis B vaccine  You may need this if you have certain conditions or if you travel or work in places where you may be exposed to hepatitis B. Haemophilus influenzae type b (Hib) vaccine  You may need this if you have certain conditions. You may receive vaccines as individual doses or as more than one vaccine together in one shot (combination vaccines). Talk with your health care provider about the risks and benefits of combination vaccines. What tests do I need? Blood tests  Lipid and cholesterol levels. These may be checked every 5 years, or more frequently depending on your overall health.  Hepatitis C test.  Hepatitis B test. Screening  Lung cancer screening. You may have this screening every year starting at age 39 if you have a 30-pack-year history of smoking and currently smoke or have quit within the past 15 years.  Colorectal cancer screening. All adults should have this screening starting at age 36 and continuing until age 15. Your health care provider may recommend screening at age 23 if you are at increased risk. You will have tests every 1-10 years, depending on your results and the type of screening test.  Diabetes screening. This is done by checking your blood sugar (glucose) after you have not eaten for a while (fasting). You may have this done every 1-3 years.  Mammogram. This may be done every 1-2 years. Talk with your health care provider about how often you should have regular mammograms.  BRCA-related cancer screening. This may be done if you have a family history of breast, ovarian, tubal, or peritoneal cancers.  Other tests  Sexually transmitted disease (STD) testing.  Bone density scan. This is done to screen for osteoporosis. You may have this done starting at age 44. Follow these instructions at home: Eating and drinking  Eat a diet that includes fresh fruits and vegetables, whole grains, lean protein, and low-fat dairy products. Limit  your intake of foods with high amounts of sugar, saturated fats, and salt.  Take vitamin and mineral supplements as recommended by your health care provider.  Do not drink alcohol if your health care provider tells you not to drink.  If you drink alcohol: ? Limit how much you have to 0-1 drink a day. ? Be aware of how much alcohol is in your drink. In the [place] one drink equals one 12 oz bottle of beer (355 mL), one 5 oz glass of wine (148 mL), or one 1 oz glass of hard liquor (44 mL). Lifestyle  Take daily care of your teeth and gums.  Stay active. Exercise for at least 30 minutes on 5 or more days each week.  Do not use any products that contain nicotine or tobacco, such as cigarettes, e-cigarettes, and chewing tobacco. If you need help quitting, ask your health care provider.  If you are sexually active, practice safe sex. Use a condom or other form of protection in order to prevent STIs (sexually transmitted infections).  Talk with your health care provider about taking a low-dose aspirin or statin. What's next?  Go to your health care provider once a year for a well check visit.  Ask your health care provider how often you should have your eyes and teeth checked.  Stay up to date on all vaccines. This information is not intended to replace advice given to you by your health care provider. Make sure you discuss any questions you have with your health care provider. Document Revised: 12/11/2018 Document Reviewed: 12/11/2018 Elsevier Patient Education  2020 [employer].

## 2020-08-29 NOTE — Progress Notes (Signed)
MEDICARE ANNUAL WELLNESS VISIT  08/29/2020  Telephone Visit Disclaimer This Medicare AWV was conducted by telephone due to national recommendations for restrictions regarding the COVID-19 Pandemic (e.g. social distancing).  I verified, using two identifiers, that I am speaking with Katelyn Ballard or their authorized healthcare agent. I discussed the limitations, risks, security, and privacy concerns of performing an evaluation and management service by telephone and the potential availability of an in-person appointment in the future. The patient expressed understanding and agreed to proceed.   Subjective:  Katelyn Ballard is a 75 y.o. female patient of Chevis Pretty, [place] who had a Medicare Annual Wellness Visit today via telephone. [name] is Retired and lives alone. she has 5 children. she reports that she is socially active and does interact with friends/family regularly. she is minimally physically active and enjoys sewing and doing word search.  Patient Care Team: Chevis Pretty, FNP as PCP - General (Family Medicine) Harlen Labs, MD as Referring Physician (Optometry) Steffanie Rainwater, DPM as Consulting Physician (Podiatry)  Advanced Directives 08/29/2020 08/25/2019 06/27/2015 04/08/2015 11/19/2014  Does Patient Have a Medical Advance Directive? No No No No No  Would patient like information on creating a medical advance directive? No - Patient declined No - Patient declined Yes - Scientist, clinical (histocompatibility and immunogenetics) given Yes - Scientist, clinical (histocompatibility and immunogenetics) given Yes - Educational materials given    Hospital Utilization Over the Past 12 Months: # of hospitalizations or ER visits: 0 # of surgeries: 0  Review of Systems    Patient reports that her overall health is worse compared to last year.  History obtained from chart review  Patient Reported Readings (BP, Pulse, CBG, Weight, etc) none  Pain Assessment Pain : No/denies pain     Current Medications & Allergies  (verified) Allergies as of 08/29/2020      Reactions   Atorvastatin Other (See Comments)   myalgias   Penicillins       Medication List       Accurate as of August 29, 2020  9:58 AM. If you have any questions, ask your nurse or doctor.        allopurinol 300 MG tablet Commonly known as: ZYLOPRIM Take 1 tablet (300 mg total) by mouth daily.   amLODipine 5 MG tablet Commonly known as: NORVASC Take 1 tablet (5 mg total) by mouth daily. (Needs to be seen before next refill)   blood glucose meter kit and supplies Check blood sugar bid and as needed Dx: E11.42   fenofibrate 160 MG tablet Take 1 tablet (160 mg total) by mouth daily.   furosemide 20 MG tablet Commonly known as: LASIX Take 1 tablet (20 mg total) by mouth daily.   glimepiride 4 MG tablet Commonly known as: AMARYL TAKE 1 TABLET BY MOUTH IN THE MORNING AND 1/2 (ONE-HALF) IN THE EVENING   lisinopril 40 MG tablet Commonly known as: ZESTRIL Take 1 tablet (40 mg total) by mouth daily. (Needs to be seen before next refill)   metFORMIN 1000 MG tablet Commonly known as: GLUCOPHAGE Take 1 tablet (1,000 mg total) by mouth 2 (two) times daily with a meal.   OneTouch Ultra test strip Generic drug: glucose blood Test glucose 2-3 times daily Dx E11.9   pravastatin 40 MG tablet Commonly known as: PRAVACHOL Take 1 tablet (40 mg total) by mouth daily.       History (reviewed): Past Medical History:  Diagnosis Date  . Ankle fracture, right    1990's  .  Diabetes mellitus without complication ([place])   . Gout 2006  . Hyperlipidemia   . Hypertension   . Osteopenia    Past Surgical History:  Procedure Laterality Date  . ABDOMINAL HYSTERECTOMY     Family History  Problem Relation Age of Onset  . Diabetes Mother   . Heart disease Mother   . Kidney disease Mother        blockage of renal artery  . Dementia Mother   . Heart disease Father   . Heart disease Sister   . Diabetes Sister   . Diabetes Sister     Social History   Socioeconomic History  . Marital status: Divorced    Spouse name: Not on file  . Number of children: 5  . Years of education: 98  . Highest education level: 11th grade  Occupational History  . Occupation: retired  Tobacco Use  . Smoking status: Former Smoker    Quit date: 12/31/1992    Years since quitting: 27.6  . Smokeless tobacco: Never Used  Vaping Use  . Vaping Use: Never used  Substance and Sexual Activity  . Alcohol use: No  . Drug use: No  . Sexual activity: Not Currently    Birth control/protection: Surgical  Other Topics Concern  . Not on file  Social History Narrative  . Not on file   Social Determinants of Health   Financial Resource Strain: Low Risk   . Difficulty of Paying Living Expenses: Not hard at all  Food Insecurity: No Food Insecurity  . Worried About Charity fundraiser in the Last Year: Never true  . Ran Out of Food in the Last Year: Never true  Transportation Needs: No Transportation Needs  . Lack of Transportation (Medical): No  . Lack of Transportation (Non-Medical): No  Physical Activity: Inactive  . Days of Exercise per Week: 0 days  . Minutes of Exercise per Session: 0 min  Stress: No Stress Concern Present  . Feeling of Stress : Not at all  Social Connections: Socially Isolated  . Frequency of Communication with Friends and Family: More than three times a week  . Frequency of Social Gatherings with Friends and Family: More than three times a week  . Attends Religious Services: Never  . Active Member of Clubs or Organizations: No  . Attends Archivist Meetings: Never  . Marital Status: Divorced    Activities of Daily Living In your present state of health, do you have any difficulty performing the following activities: 08/29/2020  Hearing? N  Vision? N  Comment had bilateral cataract surgery  Difficulty concentrating or making decisions? N  Walking or climbing stairs? N  Dressing or bathing? N  Doing  errands, shopping? N  Preparing Food and eating ? N  Using the Toilet? N  In the past six months, have you accidently leaked urine? Y  Comment wears depends  Do you have problems with loss of bowel control? Y  Comment wears depends for this-she thinks it is related to her Metformin  Managing your Medications? N  Managing your Finances? N  Housekeeping or managing your Housekeeping? Y  Comment just takes longer to complete  Some recent data might be hidden    Patient Education/ Literacy How often do you need to have someone help you when you read instructions, pamphlets, or other written materials from your doctor or pharmacy?: 1 - Never What is the last grade level you completed in school?: 11th grade  Exercise Current Exercise  Habits: The patient does not participate in regular exercise at present, Exercise limited by: orthopedic condition(s)  Diet Patient reports consuming 3 meals a day and 2 snack(s) a day Patient reports that her primary diet is: Regular Patient reports that she does have regular access to food.   Depression Screen PHQ 2/9 Scores 08/29/2020 12/08/2019 09/08/2019 08/25/2019 06/05/2019 02/26/2019 01/26/2019  PHQ - 2 Score 1 1 0 0 0 0 0  PHQ- 9 Score - - 0 - - - -     Fall Risk Fall Risk  08/29/2020 12/08/2019 09/08/2019 08/25/2019 02/26/2019  Falls in the past year? 0 0 0 0 0  Number falls in past yr: - - - 0 -  Injury with Fall? - - - 0 -  Follow up - - - Falls prevention discussed -  Comment - - - discussed getting rid of any throw rugs, having adequate lighting in the walkways and grabrails in the bathroom -     Objective:  Katelyn Ballard seemed alert and oriented and she participated appropriately during our telephone visit.  Blood Pressure Weight BMI  BP Readings from Last 3 Encounters:  09/08/19 (!) 166/86  02/26/19 (!) 165/85  01/26/19 130/82   Wt Readings from Last 3 Encounters:  09/08/19 208 lb 8 oz (94.6 kg)  02/26/19 209 lb (94.8 kg)  01/26/19 205  lb (93 kg)   BMI Readings from Last 1 Encounters:  09/08/19 36.93 kg/m    *Unable to obtain current vital signs, weight, and BMI due to telephone visit type  Hearing/Vision  . Janani did not seem to have difficulty with hearing/understanding during the telephone conversation . Reports that she has had a formal eye exam by an eye care professional within the past year . Reports that she has not had a formal hearing evaluation within the past year *Unable to fully assess hearing and vision during telephone visit type  Cognitive Function: 6CIT Screen 08/29/2020 08/25/2019  What Year? 0 points 0 points  What month? 0 points 0 points  What time? 0 points 0 points  Count back from 20 0 points 0 points  Months in reverse 4 points 0 points  Repeat phrase 2 points 6 points  Total Score 6 6   (Normal:0-7, Significant for Dysfunction: >8)  Normal Cognitive Function Screening: Yes   Immunization & Health Maintenance Record Immunization History  Administered Date(s) Administered  . Pneumococcal Conjugate-13 11/19/2014  . Pneumococcal Polysaccharide-23 09/12/2010    Health Maintenance  Topic Date Due  . COVID-19 Vaccine (1) Never done  . TETANUS/TDAP  Never done  . DEXA SCAN  03/22/2017  . INFLUENZA VACCINE  07/31/2020  . FOOT EXAM  09/07/2020  . HEMOGLOBIN A1C  09/11/2020  . OPHTHALMOLOGY EXAM  10/04/2020  . Hepatitis C Screening  Completed  . PNA vac Low Risk Adult  Completed       Assessment  This is a routine wellness examination for Katelyn Ballard.  Health Maintenance: Due or Overdue Health Maintenance Due  Topic Date Due  . COVID-19 Vaccine (1) Never done  . TETANUS/TDAP  Never done  . DEXA SCAN  03/22/2017  . INFLUENZA VACCINE  07/31/2020    Katelyn Ballard does not need a referral for Community Assistance: Care Management:   no Social Work:    no Prescription Assistance:  no Nutrition/Diabetes Education:  no   Plan:  Personalized Goals Goals Addressed             This Visit's  Progress   . Prevent falls        Personalized Health Maintenance & Screening Recommendations  Influenza vaccine Td vaccine Bone densitometry screening Shingrix vaccine COVID vaccine  Lung Cancer Screening Recommended: no (Low Dose CT Chest recommended if Age 22-80 years, 30 pack-year currently smoking OR have quit w/in past 15 years) Hepatitis C Screening recommended: no HIV Screening recommended: no  Advanced Directives: Written information was not prepared per patient's request.  Referrals & Orders No orders of the defined types were placed in this encounter.   Follow-up Plan . Follow-up with Chevis Pretty, FNP as planned . Schedule your DEXA scan as discussed . Consider Flu, TDAP, Shingrix and COVID vaccines at your next visit with your PCP   I have personally reviewed and noted the following in the patient's chart:   . Medical and social history . Use of alcohol, tobacco or illicit drugs  . Current medications and supplements . Functional ability and status . Nutritional status . Physical activity . Advanced directives . List of other physicians . Hospitalizations, surgeries, and ER visits in previous 12 months . Vitals . Screenings to include cognitive, depression, and falls . Referrals and appointments  In addition, I have reviewed and discussed with Katelyn Ballard certain preventive protocols, quality metrics, and best practice recommendations. A written personalized care plan for preventive services as well as general preventive health recommendations is available and can be mailed to the patient at her request.      Milas Hock, LPN  2/48/1859

## 2020-09-20 ENCOUNTER — Other Ambulatory Visit: Payer: Self-pay | Admitting: Nurse Practitioner

## 2020-09-20 DIAGNOSIS — E1142 Type 2 diabetes mellitus with diabetic polyneuropathy: Secondary | ICD-10-CM

## 2020-10-25 ENCOUNTER — Other Ambulatory Visit: Payer: Self-pay | Admitting: Nurse Practitioner

## 2020-10-25 DIAGNOSIS — E785 Hyperlipidemia, unspecified: Secondary | ICD-10-CM

## 2020-10-26 ENCOUNTER — Other Ambulatory Visit: Payer: Self-pay

## 2020-10-26 DIAGNOSIS — E1142 Type 2 diabetes mellitus with diabetic polyneuropathy: Secondary | ICD-10-CM

## 2020-11-02 ENCOUNTER — Other Ambulatory Visit: Payer: Self-pay | Admitting: *Deleted

## 2020-11-02 DIAGNOSIS — E1142 Type 2 diabetes mellitus with diabetic polyneuropathy: Secondary | ICD-10-CM

## 2020-11-02 NOTE — Telephone Encounter (Signed)
MMM NTBS 30 days given 09/20/20 LOV 03/10/20

## 2020-11-02 NOTE — Telephone Encounter (Signed)
Appointment scheduled.

## 2020-11-06 ENCOUNTER — Other Ambulatory Visit: Payer: Self-pay | Admitting: Nurse Practitioner

## 2020-11-06 DIAGNOSIS — M1A9XX0 Chronic gout, unspecified, without tophus (tophi): Secondary | ICD-10-CM

## 2020-11-10 ENCOUNTER — Ambulatory Visit (INDEPENDENT_AMBULATORY_CARE_PROVIDER_SITE_OTHER): Payer: Medicare Other | Admitting: Nurse Practitioner

## 2020-11-10 ENCOUNTER — Encounter: Payer: Self-pay | Admitting: Nurse Practitioner

## 2020-11-10 DIAGNOSIS — E785 Hyperlipidemia, unspecified: Secondary | ICD-10-CM | POA: Diagnosis not present

## 2020-11-10 DIAGNOSIS — E559 Vitamin D deficiency, unspecified: Secondary | ICD-10-CM | POA: Diagnosis not present

## 2020-11-10 DIAGNOSIS — R0609 Other forms of dyspnea: Secondary | ICD-10-CM

## 2020-11-10 DIAGNOSIS — M1A9XX0 Chronic gout, unspecified, without tophus (tophi): Secondary | ICD-10-CM

## 2020-11-10 DIAGNOSIS — R0600 Dyspnea, unspecified: Secondary | ICD-10-CM

## 2020-11-10 DIAGNOSIS — E1142 Type 2 diabetes mellitus with diabetic polyneuropathy: Secondary | ICD-10-CM | POA: Diagnosis not present

## 2020-11-10 DIAGNOSIS — I10 Essential (primary) hypertension: Secondary | ICD-10-CM | POA: Diagnosis not present

## 2020-11-10 DIAGNOSIS — I159 Secondary hypertension, unspecified: Secondary | ICD-10-CM

## 2020-11-10 DIAGNOSIS — Z6833 Body mass index (BMI) 33.0-33.9, adult: Secondary | ICD-10-CM

## 2020-11-10 MED ORDER — LISINOPRIL 40 MG PO TABS: 40.0000 mg | ORAL_TABLET | Freq: Every day | ORAL | 1 refills | Status: DC

## 2020-11-10 MED ORDER — GLIMEPIRIDE 4 MG PO TABS: ORAL_TABLET | ORAL | 1 refills | Status: DC

## 2020-11-10 MED ORDER — FUROSEMIDE 20 MG PO TABS: 20.0000 mg | ORAL_TABLET | Freq: Every day | ORAL | 1 refills | Status: DC

## 2020-11-10 MED ORDER — ALLOPURINOL 300 MG PO TABS: 300.0000 mg | ORAL_TABLET | Freq: Every day | ORAL | 1 refills | Status: DC

## 2020-11-10 MED ORDER — METFORMIN HCL 1000 MG PO TABS: 1000.0000 mg | ORAL_TABLET | Freq: Two times a day (BID) | ORAL | 1 refills | Status: DC

## 2020-11-10 MED ORDER — PRAVASTATIN SODIUM 40 MG PO TABS: 40.0000 mg | ORAL_TABLET | Freq: Every day | ORAL | 1 refills | Status: DC

## 2020-11-10 MED ORDER — AMLODIPINE BESYLATE 5 MG PO TABS: 5.0000 mg | ORAL_TABLET | Freq: Every day | ORAL | 1 refills | Status: DC

## 2020-11-10 MED ORDER — FENOFIBRATE 160 MG PO TABS: 160.0000 mg | ORAL_TABLET | Freq: Every day | ORAL | 1 refills | Status: DC

## 2020-11-10 NOTE — Progress Notes (Signed)
Virtual Visit via telephone Note Due to COVID-19 pandemic this visit was conducted virtually. This visit type was conducted due to national recommendations for restrictions regarding the COVID-19 Pandemic (e.g. social distancing, sheltering in place) in an effort to limit this patient's exposure and mitigate transmission in our community. All issues noted in this document were discussed and addressed.  A physical exam was not performed with this format.  I connected with Katelyn Ballard on 11/10/20 at 9:56 by telephone and verified that I am speaking with the correct person using two identifiers. Katelyn Ballard is currently located at home and no one is currently with her during visit. The provider, Mary-Margaret Hassell Done, FNP is located in their office at time of visit.  I discussed the limitations, risks, security and privacy concerns of performing an evaluation and management service by telephone and the availability of in person appointments. I also discussed with the patient that there may be a patient responsible charge related to this service. The patient expressed understanding and agreed to proceed.   History and Present Illness:   Chief Complaint: Medical Management of Chronic Issues    HPI:  1. Primary hypertension No c/o chest pain, sob or headache. Does not check blood pressure at home. BP Readings from Last 3 Encounters:  09/08/19 (!) 166/86  02/26/19 (!) 165/85  01/26/19 130/82     2. Hyperlipidemia with target LDL less than 100 Doe snot really wtach diet and does no dedicated exercise. Lab Results  Component Value Date   CHOL 152 03/11/2020   HDL 32 (L) 03/11/2020   LDLCALC 84 03/11/2020   TRIG 213 (H) 03/11/2020   CHOLHDL 4.8 (H) 03/11/2020     3. Type 2 diabetes mellitus with diabetic polyneuropathy, without long-term current use of insulin (HCC) Fasting blood are running over 200. Sh ehas not been watching diet. Denies any low blood sugars. Her last visit  was in march and it was a telephone visit as well. Lab Results  Component Value Date   HGBA1C 9.3 (H) 03/11/2020     4. Chronic gout without tophus, unspecified cause, unspecified site No recent flare ups.  5. Vitamin D deficiency She has ot been taking her vitamin d the last month  6. BMI 33.0-33.9,adult No recent weight changes Wt Readings from Last 3 Encounters:  09/08/19 208 lb 8 oz (94.6 kg)  02/26/19 209 lb (94.8 kg)  01/26/19 205 lb (93 kg)   BMI Readings from Last 3 Encounters:  09/08/19 36.93 kg/m  02/26/19 37.02 kg/m  01/26/19 36.31 kg/m       Outpatient Encounter Medications as of 11/10/2020  Medication Sig  . allopurinol (ZYLOPRIM) 300 MG tablet Take 1 tablet (300 mg total) by mouth daily.  Marland Kitchen amLODipine (NORVASC) 5 MG tablet Take 1 tablet (5 mg total) by mouth daily. (Needs to be seen before next refill)  . blood glucose meter kit and supplies Check blood sugar bid and as needed Dx: E11.42  . fenofibrate 160 MG tablet Take 1 tablet (160 mg total) by mouth daily. (Needs to be seen before next refill)  . furosemide (LASIX) 20 MG tablet Take 1 tablet (20 mg total) by mouth daily.  Marland Kitchen glimepiride (AMARYL) 4 MG tablet TAKE 1 TABLET BY MOUTH IN THE MORNING AND 1/2 (ONE-HALF) IN THE EVENING (Needs to be seen before next refill)  . glucose blood (ONETOUCH ULTRA) test strip Test glucose 2-3 times daily Dx E11.9  . lisinopril (ZESTRIL) 40 MG tablet Take 1  tablet (40 mg total) by mouth daily. (Needs to be seen before next refill)  . metFORMIN (GLUCOPHAGE) 1000 MG tablet Take 1 tablet (1,000 mg total) by mouth 2 (two) times daily with a meal.  . pravastatin (PRAVACHOL) 40 MG tablet Take 1 tablet (40 mg total) by mouth daily.   No facility-administered encounter medications on file as of 11/10/2020.    Past Surgical History:  Procedure Laterality Date  . ABDOMINAL HYSTERECTOMY      Family History  Problem Relation Age of Onset  . Diabetes Mother   . Heart disease  Mother   . Kidney disease Mother        blockage of renal artery  . Dementia Mother   . Heart disease Father   . Heart disease Sister   . Diabetes Sister   . Diabetes Sister     New complaints: None today  Social history: Lives by herself. She has family that checks on herdaily.  Controlled substance contract: n/a    Review of Systems  Constitutional: Negative for diaphoresis and weight loss.  Eyes: Negative for blurred vision, double vision and pain.  Respiratory: Negative for shortness of breath.   Cardiovascular: Negative for chest pain, palpitations, orthopnea and leg swelling.  Gastrointestinal: Negative for abdominal pain.  Skin: Negative for rash.  Neurological: Negative for dizziness, sensory change, loss of consciousness, weakness and headaches.  Endo/Heme/Allergies: Negative for polydipsia. Does not bruise/bleed easily.  Psychiatric/Behavioral: Negative for memory loss. The patient does not have insomnia.   All other systems reviewed and are negative.    Observations/Objective: Alert and oriented- answers all questions appropriately No distress    Assessment and Plan: Katelyn Ballard comes in today with chief complaint of Medical Management of Chronic Issues   Diagnosis and orders addressed:  1. Primary hypertension Low sodium diet - CBC with Differential/Platelet; Future - CMP14+EGFR; Future - amLODipine (NORVASC) 5 MG tablet; Take 1 tablet (5 mg total) by mouth daily. (Needs to be seen before next refill)  Dispense: 90 tablet; Refill: 1 - lisinopril (ZESTRIL) 40 MG tablet; Take 1 tablet (40 mg total) by mouth daily. (Needs to be seen before next refill)  Dispense: 90 tablet; Refill: 1  2. Hyperlipidemia with target LDL less than 100 Low fat diet - Lipid panel; Future - fenofibrate 160 MG tablet; Take 1 tablet (160 mg total) by mouth daily. (Needs to be seen before next refill)  Dispense: 90 tablet; Refill: 1 - pravastatin (PRAVACHOL) 40 MG tablet;  Take 1 tablet (40 mg total) by mouth daily.  Dispense: 90 tablet; Refill: 1  3. Type 2 diabetes mellitus with diabetic polyneuropathy, without long-term current use of insulin (HCC) Strict carb counting - Bayer DCA Hb A1c Waived; Future - glimepiride (AMARYL) 4 MG tablet; TAKE 1 TABLET BY MOUTH IN THE MORNING AND 1/2 (ONE-HALF) IN THE EVENING (Needs to be seen before next refill)  Dispense: 45 tablet; Refill: 1 - metFORMIN (GLUCOPHAGE) 1000 MG tablet; Take 1 tablet (1,000 mg total) by mouth 2 (two) times daily with a meal.  Dispense: 180 tablet; Refill: 1  4. Chronic gout without tophus, unspecified cause, unspecified site Low purine diet - allopurinol (ZYLOPRIM) 300 MG tablet; Take 1 tablet (300 mg total) by mouth daily.  Dispense: 90 tablet; Refill: 1  5. Vitamin D deficiency Vitamin d supplement daily  6. BMI 33.0-33.9,adult Discussed diet and exercise for person with BMI >25 Will recheck weight in 3-6 months  7. DOE (dyspnea on exertion) - furosemide (LASIX)  20 MG tablet; Take 1 tablet (20 mg total) by mouth daily.  Dispense: 90 tablet; Refill: 1    Labs pending- PATIENT WILL COME IN FOR LABS Health Maintenance reviewed Diet and exercise encouraged  Follow up plan: 3 months    I discussed the assessment and treatment plan with the patient. The patient was provided an opportunity to ask questions and all were answered. The patient agreed with the plan and demonstrated an understanding of the instructions.   The patient was advised to call back or seek an in-person evaluation if the symptoms worsen or if the condition fails to improve as anticipated.  The above assessment and management plan was discussed with the patient. The patient verbalized understanding of and has agreed to the management plan. Patient is aware to call the clinic if symptoms persist or worsen. Patient is aware when to return to the clinic for a follow-up visit. Patient educated on when it is appropriate  to go to the emergency department.   Time call ended:  10:13 I provided 18 minutes of non-face-to-face time during this encounter.    Mary-Margaret Hassell Done, FNP

## 2020-12-05 ENCOUNTER — Other Ambulatory Visit: Payer: Medicare Other

## 2020-12-05 ENCOUNTER — Other Ambulatory Visit: Payer: Self-pay

## 2020-12-05 DIAGNOSIS — E1142 Type 2 diabetes mellitus with diabetic polyneuropathy: Secondary | ICD-10-CM | POA: Diagnosis not present

## 2020-12-05 DIAGNOSIS — E785 Hyperlipidemia, unspecified: Secondary | ICD-10-CM | POA: Diagnosis not present

## 2020-12-05 DIAGNOSIS — I10 Essential (primary) hypertension: Secondary | ICD-10-CM | POA: Diagnosis not present

## 2020-12-05 LAB — BAYER DCA HB A1C WAIVED: HB A1C (BAYER DCA - WAIVED): 8.2 % — ABNORMAL HIGH (ref <7.0)

## 2020-12-06 LAB — CBC WITH DIFFERENTIAL/PLATELET
Basophils Absolute: 0.1 10*3/uL (ref 0.0–0.2)
Basos: 1 %
EOS (ABSOLUTE): 0.3 10*3/uL (ref 0.0–0.4)
Eos: 4 %
Hematocrit: 40.3 % (ref 34.0–46.6)
Hemoglobin: 12.4 g/dL (ref 11.1–15.9)
Immature Grans (Abs): 0 10*3/uL (ref 0.0–0.1)
Immature Granulocytes: 0 %
Lymphocytes Absolute: 2.5 10*3/uL (ref 0.7–3.1)
Lymphs: 28 %
MCH: 24.9 pg — ABNORMAL LOW (ref 26.6–33.0)
MCHC: 30.8 g/dL — ABNORMAL LOW (ref 31.5–35.7)
MCV: 81 fL (ref 79–97)
Monocytes Absolute: 0.6 10*3/uL (ref 0.1–0.9)
Monocytes: 7 %
Neutrophils Absolute: 5.4 10*3/uL (ref 1.4–7.0)
Neutrophils: 60 %
Platelets: 364 10*3/uL (ref 150–450)
RBC: 4.97 x10E6/uL (ref 3.77–5.28)
RDW: 14.3 % (ref 11.7–15.4)
WBC: 8.9 10*3/uL (ref 3.4–10.8)

## 2020-12-06 LAB — CMP14+EGFR
ALT: 12 IU/L (ref 0–32)
AST: 9 IU/L (ref 0–40)
Albumin/Globulin Ratio: 1.1 — ABNORMAL LOW (ref 1.2–2.2)
Albumin: 3.9 g/dL (ref 3.7–4.7)
Alkaline Phosphatase: 85 IU/L (ref 44–121)
BUN/Creatinine Ratio: 20 (ref 12–28)
BUN: 21 mg/dL (ref 8–27)
Bilirubin Total: 0.3 mg/dL (ref 0.0–1.2)
CO2: 21 mmol/L (ref 20–29)
Calcium: 9.2 mg/dL (ref 8.7–10.3)
Chloride: 103 mmol/L (ref 96–106)
Creatinine, Ser: 1.05 mg/dL — ABNORMAL HIGH (ref 0.57–1.00)
GFR calc Af Amer: 60 mL/min/{1.73_m2} (ref >59)
GFR calc non Af Amer: 52 mL/min/{1.73_m2} — ABNORMAL LOW (ref >59)
Globulin, Total: 3.4 g/dL (ref 1.5–4.5)
Glucose: 165 mg/dL — ABNORMAL HIGH (ref 65–99)
Potassium: 4.5 mmol/L (ref 3.5–5.2)
Sodium: 142 mmol/L (ref 134–144)
Total Protein: 7.3 g/dL (ref 6.0–8.5)

## 2020-12-06 LAB — LIPID PANEL
Chol/HDL Ratio: 4.7 ratio — ABNORMAL HIGH (ref 0.0–4.4)
Cholesterol, Total: 161 mg/dL (ref 100–199)
HDL: 34 mg/dL — ABNORMAL LOW (ref >39)
LDL Chol Calc (NIH): 95 mg/dL (ref 0–99)
Triglycerides: 183 mg/dL — ABNORMAL HIGH (ref 0–149)
VLDL Cholesterol Cal: 32 mg/dL (ref 5–40)

## 2020-12-08 ENCOUNTER — Other Ambulatory Visit: Payer: Self-pay | Admitting: Nurse Practitioner

## 2020-12-22 IMAGING — DX DG HAND COMPLETE 3+V*L*
3 series · 3 of 3 positions shown · non-contrast
Comparison: None.

CLINICAL DATA: Left long finger pain.  No known injury.

EXAM:
LEFT HAND - COMPLETE 3+ VIEW

[hand pa]
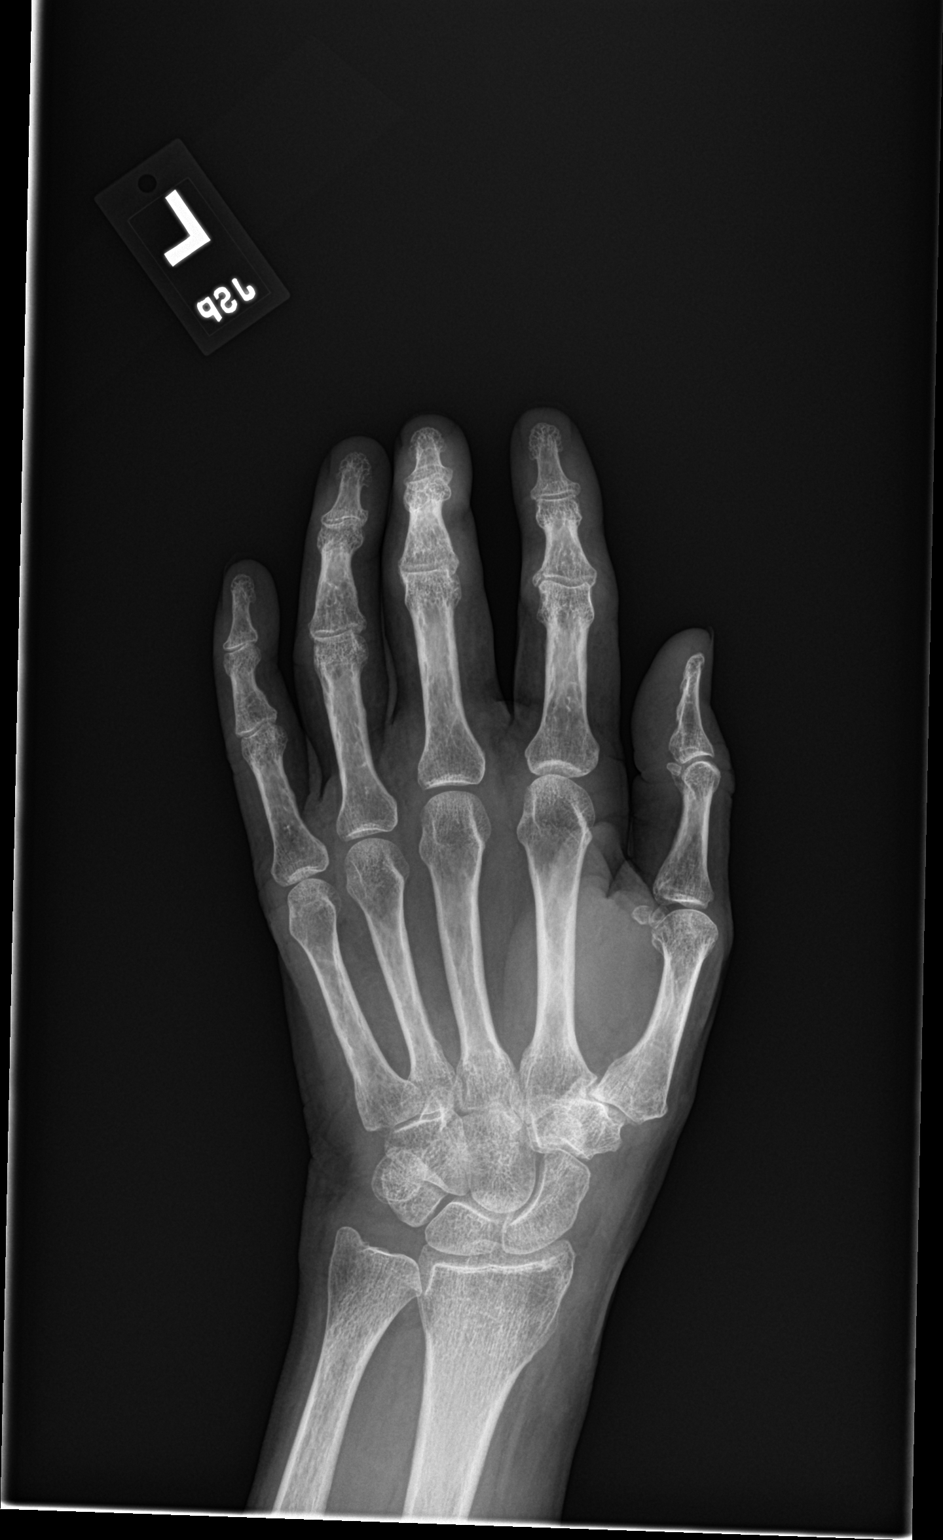

[hand obl]
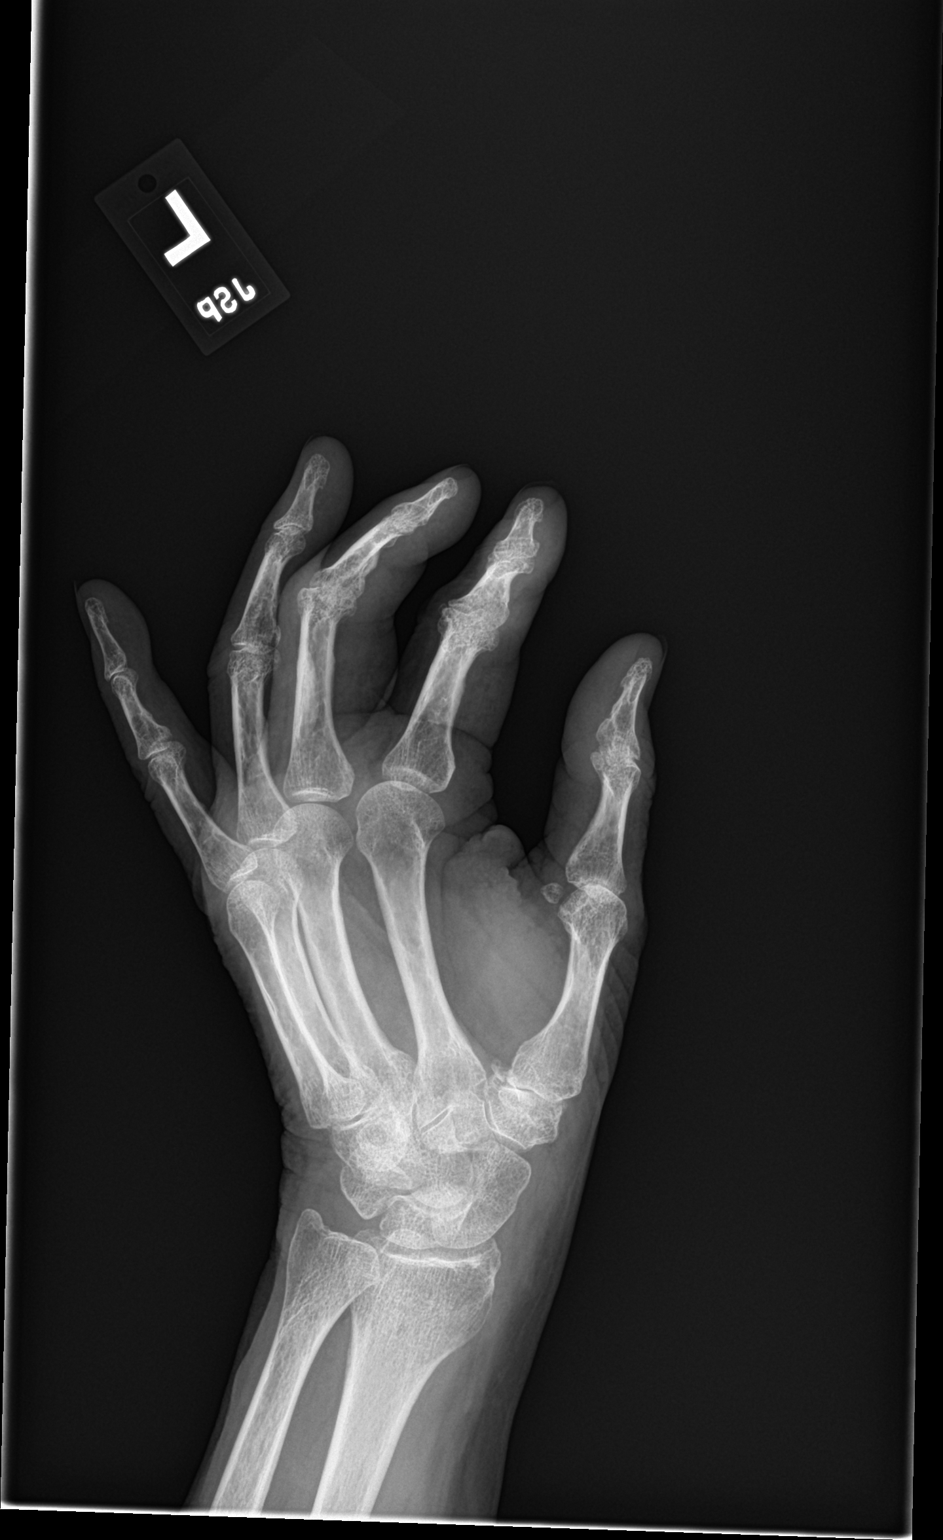

[hand lat]
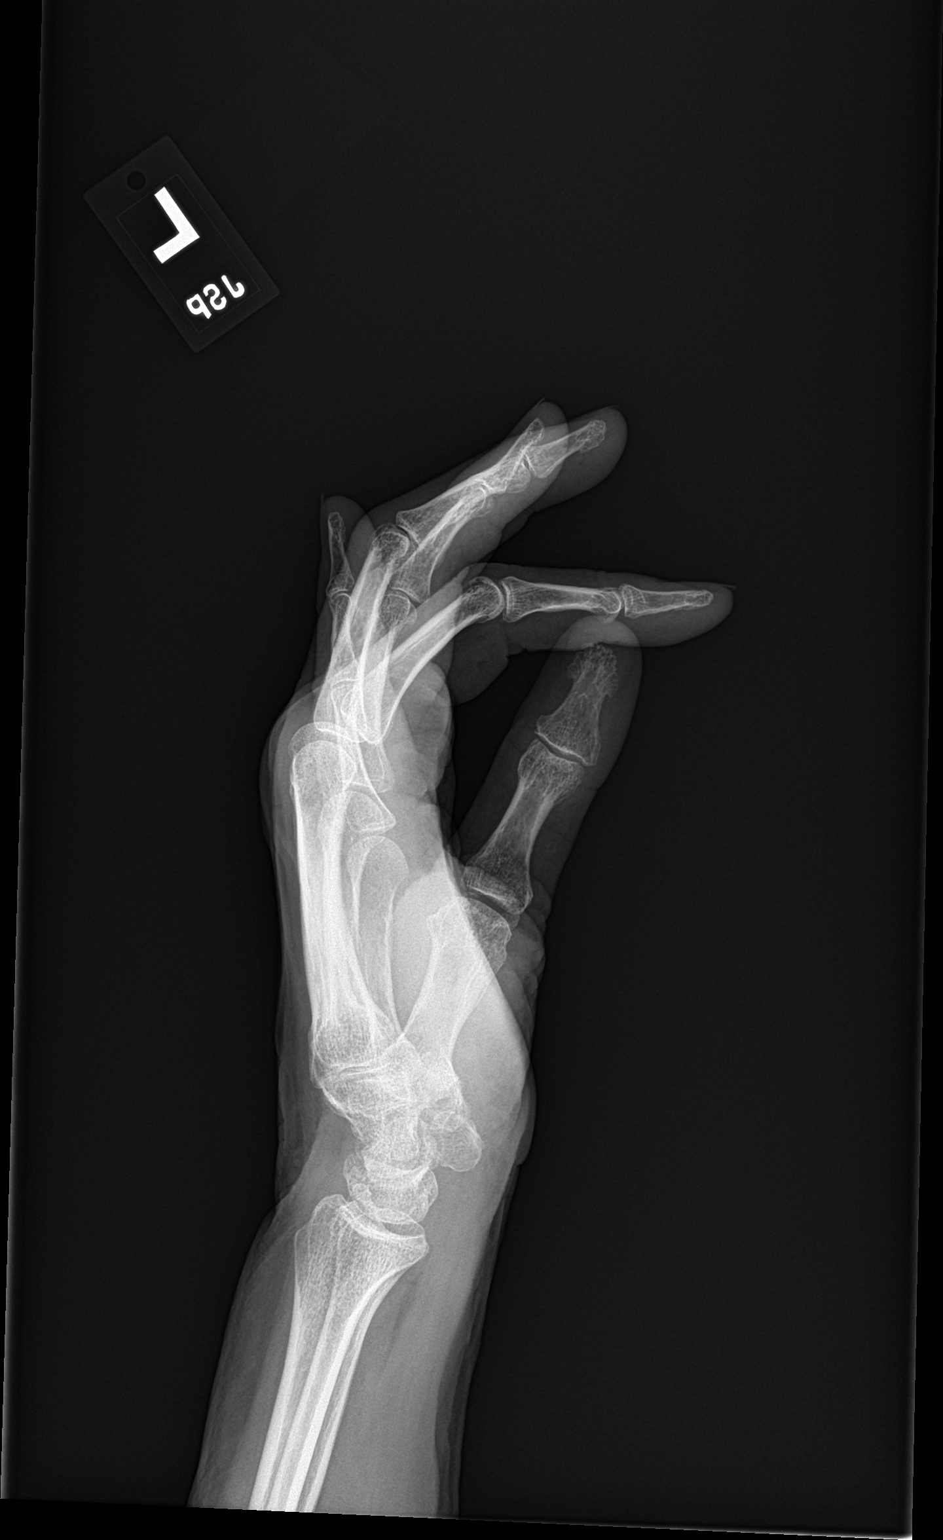

[3 of 3 positions shown; findings below may reference images not displayed]

FINDINGS: No acute bony or joint abnormality is identified. Joint space
narrowing and osteophytosis are seen about scattered interphalangeal
joints and at the first CMC and scaphoid trapezium trapezoid joints.
No erosion or periostitis. Mineralization and alignment are normal.
No fracture or dislocation. No chondrocalcinosis.
IMPRESSION: No acute abnormality.

Scattered osteoarthritis.

## 2021-02-04 DIAGNOSIS — R2243 Localized swelling, mass and lump, lower limb, bilateral: Secondary | ICD-10-CM | POA: Diagnosis not present

## 2021-02-04 DIAGNOSIS — I10 Essential (primary) hypertension: Secondary | ICD-10-CM | POA: Diagnosis not present

## 2021-02-04 DIAGNOSIS — I071 Rheumatic tricuspid insufficiency: Secondary | ICD-10-CM | POA: Diagnosis not present

## 2021-02-04 DIAGNOSIS — I2699 Other pulmonary embolism without acute cor pulmonale: Secondary | ICD-10-CM | POA: Diagnosis not present

## 2021-02-04 DIAGNOSIS — E876 Hypokalemia: Secondary | ICD-10-CM | POA: Diagnosis not present

## 2021-02-04 DIAGNOSIS — D6869 Other thrombophilia: Secondary | ICD-10-CM | POA: Diagnosis not present

## 2021-02-04 DIAGNOSIS — E119 Type 2 diabetes mellitus without complications: Secondary | ICD-10-CM | POA: Diagnosis not present

## 2021-02-04 DIAGNOSIS — Z880 Allergy status to penicillin: Secondary | ICD-10-CM | POA: Diagnosis not present

## 2021-02-04 DIAGNOSIS — I517 Cardiomegaly: Secondary | ICD-10-CM | POA: Diagnosis not present

## 2021-02-04 DIAGNOSIS — J439 Emphysema, unspecified: Secondary | ICD-10-CM | POA: Diagnosis not present

## 2021-02-04 DIAGNOSIS — J1282 Pneumonia due to coronavirus disease 2019: Secondary | ICD-10-CM | POA: Diagnosis not present

## 2021-02-04 DIAGNOSIS — R0602 Shortness of breath: Secondary | ICD-10-CM | POA: Diagnosis not present

## 2021-02-04 DIAGNOSIS — Z7901 Long term (current) use of anticoagulants: Secondary | ICD-10-CM | POA: Diagnosis not present

## 2021-02-04 DIAGNOSIS — Z87891 Personal history of nicotine dependence: Secondary | ICD-10-CM | POA: Diagnosis not present

## 2021-02-04 DIAGNOSIS — R0902 Hypoxemia: Secondary | ICD-10-CM | POA: Diagnosis not present

## 2021-02-04 DIAGNOSIS — Z7984 Long term (current) use of oral hypoglycemic drugs: Secondary | ICD-10-CM | POA: Diagnosis not present

## 2021-02-04 DIAGNOSIS — M109 Gout, unspecified: Secondary | ICD-10-CM | POA: Diagnosis not present

## 2021-02-04 DIAGNOSIS — U071 COVID-19: Secondary | ICD-10-CM | POA: Diagnosis not present

## 2021-02-04 DIAGNOSIS — E785 Hyperlipidemia, unspecified: Secondary | ICD-10-CM | POA: Diagnosis not present

## 2021-02-04 DIAGNOSIS — E559 Vitamin D deficiency, unspecified: Secondary | ICD-10-CM | POA: Diagnosis not present

## 2021-02-04 DIAGNOSIS — I4891 Unspecified atrial fibrillation: Secondary | ICD-10-CM | POA: Diagnosis not present

## 2021-02-04 DIAGNOSIS — J9601 Acute respiratory failure with hypoxia: Secondary | ICD-10-CM | POA: Diagnosis not present

## 2021-03-08 DIAGNOSIS — U071 COVID-19: Secondary | ICD-10-CM | POA: Diagnosis not present

## 2021-03-09 ENCOUNTER — Other Ambulatory Visit: Payer: Self-pay | Admitting: *Deleted

## 2021-03-09 DIAGNOSIS — R0609 Other forms of dyspnea: Secondary | ICD-10-CM

## 2021-03-09 DIAGNOSIS — E785 Hyperlipidemia, unspecified: Secondary | ICD-10-CM

## 2021-03-09 DIAGNOSIS — R0600 Dyspnea, unspecified: Secondary | ICD-10-CM

## 2021-03-09 DIAGNOSIS — M1A9XX0 Chronic gout, unspecified, without tophus (tophi): Secondary | ICD-10-CM

## 2021-03-09 DIAGNOSIS — E1142 Type 2 diabetes mellitus with diabetic polyneuropathy: Secondary | ICD-10-CM

## 2021-03-09 DIAGNOSIS — I159 Secondary hypertension, unspecified: Secondary | ICD-10-CM

## 2021-03-09 MED ORDER — AMLODIPINE BESYLATE 5 MG PO TABS: 5.0000 mg | ORAL_TABLET | Freq: Every day | ORAL | 1 refills | Status: DC

## 2021-03-09 MED ORDER — APIXABAN 5 MG PO TABS: 5.0000 mg | ORAL_TABLET | Freq: Two times a day (BID) | ORAL | 0 refills | Status: DC

## 2021-03-09 MED ORDER — LISINOPRIL 40 MG PO TABS: 40.0000 mg | ORAL_TABLET | Freq: Every day | ORAL | 0 refills | Status: DC

## 2021-03-09 MED ORDER — FUROSEMIDE 20 MG PO TABS: 20.0000 mg | ORAL_TABLET | Freq: Every day | ORAL | 0 refills | Status: DC

## 2021-03-09 MED ORDER — ALLOPURINOL 300 MG PO TABS
300.0000 mg | ORAL_TABLET | Freq: Every day | ORAL | 1 refills | Status: DC
Start: 2021-03-09 — End: 2021-04-18

## 2021-03-09 MED ORDER — PRAVASTATIN SODIUM 40 MG PO TABS
40.0000 mg | ORAL_TABLET | Freq: Every day | ORAL | 0 refills | Status: DC
Start: 2021-03-09 — End: 2021-03-21

## 2021-03-09 MED ORDER — FENOFIBRATE 160 MG PO TABS: 160.0000 mg | ORAL_TABLET | Freq: Every day | ORAL | 0 refills | Status: DC

## 2021-03-09 MED ORDER — METFORMIN HCL 1000 MG PO TABS: 1000.0000 mg | ORAL_TABLET | Freq: Two times a day (BID) | ORAL | 0 refills | Status: DC

## 2021-03-09 MED ORDER — GLIMEPIRIDE 4 MG PO TABS
ORAL_TABLET | ORAL | 0 refills | Status: DC
Start: 2021-03-09 — End: 2021-03-21

## 2021-03-09 NOTE — Telephone Encounter (Signed)
Pt has a hosp f/u appt scheduled for 03/14/21. She needs refills of all meds until then. Last seen 10/2020

## 2021-03-14 ENCOUNTER — Encounter: Payer: Self-pay | Admitting: Nurse Practitioner

## 2021-03-14 ENCOUNTER — Ambulatory Visit: Payer: Medicare Other | Admitting: Nurse Practitioner

## 2021-03-21 ENCOUNTER — Ambulatory Visit (INDEPENDENT_AMBULATORY_CARE_PROVIDER_SITE_OTHER): Payer: Medicare Other | Admitting: Nurse Practitioner

## 2021-03-21 ENCOUNTER — Encounter: Payer: Self-pay | Admitting: Nurse Practitioner

## 2021-03-21 DIAGNOSIS — R0600 Dyspnea, unspecified: Secondary | ICD-10-CM | POA: Diagnosis not present

## 2021-03-21 DIAGNOSIS — E1142 Type 2 diabetes mellitus with diabetic polyneuropathy: Secondary | ICD-10-CM | POA: Diagnosis not present

## 2021-03-21 DIAGNOSIS — I159 Secondary hypertension, unspecified: Secondary | ICD-10-CM

## 2021-03-21 DIAGNOSIS — E785 Hyperlipidemia, unspecified: Secondary | ICD-10-CM | POA: Diagnosis not present

## 2021-03-21 DIAGNOSIS — U099 Post covid-19 condition, unspecified: Secondary | ICD-10-CM | POA: Diagnosis not present

## 2021-03-21 DIAGNOSIS — R053 Chronic cough: Secondary | ICD-10-CM | POA: Diagnosis not present

## 2021-03-21 DIAGNOSIS — R0609 Other forms of dyspnea: Secondary | ICD-10-CM

## 2021-03-21 MED ORDER — GLIMEPIRIDE 4 MG PO TABS: ORAL_TABLET | ORAL | 2 refills | Status: DC

## 2021-03-21 MED ORDER — METFORMIN HCL 1000 MG PO TABS: 1000.0000 mg | ORAL_TABLET | Freq: Two times a day (BID) | ORAL | 2 refills | Status: DC

## 2021-03-21 MED ORDER — PRAVASTATIN SODIUM 40 MG PO TABS: 40.0000 mg | ORAL_TABLET | Freq: Every day | ORAL | 2 refills | Status: DC

## 2021-03-21 MED ORDER — FENOFIBRATE 160 MG PO TABS
160.0000 mg | ORAL_TABLET | Freq: Every day | ORAL | 2 refills | Status: DC
Start: 2021-03-21 — End: 2021-04-18

## 2021-03-21 MED ORDER — LISINOPRIL 40 MG PO TABS
40.0000 mg | ORAL_TABLET | Freq: Every day | ORAL | 2 refills | Status: DC
Start: 2021-03-21 — End: 2021-04-18

## 2021-03-21 MED ORDER — APIXABAN 5 MG PO TABS: 5.0000 mg | ORAL_TABLET | Freq: Two times a day (BID) | ORAL | 2 refills | Status: DC

## 2021-03-21 MED ORDER — AMLODIPINE BESYLATE 5 MG PO TABS
5.0000 mg | ORAL_TABLET | Freq: Every day | ORAL | 1 refills | Status: DC
Start: 2021-03-21 — End: 2021-04-18

## 2021-03-21 MED ORDER — FUROSEMIDE 20 MG PO TABS
20.0000 mg | ORAL_TABLET | Freq: Every day | ORAL | 2 refills | Status: DC
Start: 2021-03-21 — End: 2021-04-18

## 2021-03-21 NOTE — Progress Notes (Signed)
Virtual Visit via telephone Note Due to COVID-19 pandemic this visit was conducted virtually. This visit type was conducted due to national recommendations for restrictions regarding the COVID-19 Pandemic (e.g. social distancing, sheltering in place) in an effort to limit this patient's exposure and mitigate transmission in our community. All issues noted in this document were discussed and addressed.  A physical exam was not performed with this format.  I connected with Katelyn Katelyn on 03/21/21 at 8:10 by telephone and verified that I am speaking with the correct person using two identifiers. Katelyn Katelyn is currently located at her sons house and no one is currently with her during visit. The provider, Mary-Margaret Daphine Deutscher, FNP is located in their office at time of visit.  I discussed the limitations, risks, security and privacy concerns of performing an evaluation and management service by telephone and the availability of in person appointments. I also discussed with the patient that there may be a patient responsible charge related to this service. The patient expressed understanding and agreed to proceed.   History and Present Illness:   Chief Complaint: Hospitalization Follow-up   HPI Patient was in hospital 3 weeks ago diagnosed with covid. She says she is doing well. Still has slight cough. Says she feels like she has s brain fog. She is still very fatigued. She has been on oxygen since she has been at home. O2 sat is 95%. In the last 3-4days she has tried weaning herself down. o2 sat off of oxygen has been around 92-93%.   Review of Systems  Constitutional: Positive for malaise/fatigue. Negative for chills and fever.  HENT: Positive for congestion (slight).   Respiratory: Positive for cough. Negative for sputum production and shortness of breath.   Musculoskeletal: Negative for myalgias.  Neurological: Negative for dizziness and headaches.  Psychiatric/Behavioral:  Negative.   All other systems reviewed and are negative.    Observations/Objective: Alert and oriented- answers all questions appropriately No distress Hoarse Slight cough  Noted during visit  Assessment and Plan: Katelyn Katelyn in today with chief complaint of Hospitalization Follow-up   1. Post-COVID-19 syndrome manifesting as chronic cough Continue oxygen- ok to wean down some Continue checking oxygen saturation Rest  Meds ordered this encounter  Medications  . furosemide (LASIX) 20 MG tablet    Sig: Take 1 tablet (20 mg total) by mouth daily.    Dispense:  30 tablet    Refill:  2    Order Specific Question:   Supervising Provider    Answer:   Arville Care A [id number]  . pravastatin (PRAVACHOL) 40 MG tablet    Sig: Take 1 tablet (40 mg total) by mouth daily.    Dispense:  30 tablet    Refill:  2    Order Specific Question:   Supervising Provider    Answer:   Arville Care A [id number]  . fenofibrate 160 MG tablet    Sig: Take 1 tablet (160 mg total) by mouth daily. (Needs to be seen before next refill)    Dispense:  30 tablet    Refill:  2    Order Specific Question:   Supervising Provider    Answer:   Arville Care A [id number]  . amLODipine (NORVASC) 5 MG tablet    Sig: Take 1 tablet (5 mg total) by mouth daily. (Needs to be seen before next refill)    Dispense:  90 tablet    Refill:  1    Order Specific Question:  Supervising Provider    Answer:   Arville Care A [[id number]]  . lisinopril (ZESTRIL) 40 MG tablet    Sig: Take 1 tablet (40 mg total) by mouth daily. (Needs to be seen before next refill)    Dispense:  30 tablet    Refill:  2    Order Specific Question:   Supervising Provider    Answer:   Arville Care A [id number]  . metFORMIN (GLUCOPHAGE) 1000 MG tablet    Sig: Take 1 tablet (1,000 mg total) by mouth 2 (two) times daily with a meal.    Dispense:  60 tablet    Refill:  2    Order Specific Question:   Supervising Provider     Answer:   Arville Care A [id number]  . glimepiride (AMARYL) 4 MG tablet    Sig: TAKE 1 TABLET BY MOUTH IN THE MORNING AND 1/2 (ONE-HALF) IN THE EVENING (Needs to be seen before next refill)    Dispense:  45 tablet    Refill:  2    Order Specific Question:   Supervising Provider    Answer:   Arville Care A [id number]  . apixaban (ELIQUIS) 5 MG TABS tablet    Sig: Take 1 tablet (5 mg total) by mouth 2 (two) times daily.    Dispense:  60 tablet    Refill:  2    Order Specific Question:   Supervising Provider    Answer:   Arville Care A [[id number]]      Follow Up Instructions: 1 month    I discussed the assessment and treatment plan with the patient. The patient was provided an opportunity to ask questions and all were answered. The patient agreed with the plan and demonstrated an understanding of the instructions.   The patient was advised to call back or seek an in-person evaluation if the symptoms worsen or if the condition fails to improve as anticipated.  The above assessment and management plan was discussed with the patient. The patient verbalized understanding of and has agreed to the management plan. Patient is aware to call the clinic if symptoms persist or worsen. Patient is aware when to return to the clinic for a follow-up visit. Patient educated on when it is appropriate to go to the emergency department.   Time call ended:  8:25  I provided 15 minutes of non-face-to-face time during this encounter.    Mary-Margaret Daphine Deutscher, FNP

## 2021-03-27 ENCOUNTER — Telehealth: Payer: Self-pay

## 2021-03-27 NOTE — Telephone Encounter (Signed)
Pt called to request med refills. I asked her twice which medications needed refills and she said all of them. I asked her to let me know which ones so that the nurse could go ahead with the refills and not have to call back. She said that she did not know that name of the medications and that her son handles her rx refills. Use [facility]

## 2021-03-27 NOTE — Telephone Encounter (Signed)
Pt had medications refilled on 03/21/21.

## 2021-03-30 ENCOUNTER — Other Ambulatory Visit: Payer: Self-pay | Admitting: Nurse Practitioner

## 2021-04-07 ENCOUNTER — Ambulatory Visit: Payer: Self-pay | Admitting: Nurse Practitioner

## 2021-04-08 DIAGNOSIS — U071 COVID-19: Secondary | ICD-10-CM | POA: Diagnosis not present

## 2021-04-18 ENCOUNTER — Other Ambulatory Visit: Payer: Self-pay

## 2021-04-18 ENCOUNTER — Ambulatory Visit: Payer: Self-pay | Admitting: Nurse Practitioner

## 2021-04-18 ENCOUNTER — Ambulatory Visit (INDEPENDENT_AMBULATORY_CARE_PROVIDER_SITE_OTHER): Payer: Medicare Other | Admitting: Nurse Practitioner

## 2021-04-18 ENCOUNTER — Encounter: Payer: Self-pay | Admitting: Nurse Practitioner

## 2021-04-18 VITALS — BP 159/94 | HR 79 | Temp 98.6°F | Resp 20 | Ht 63.0 in | Wt 188.0 lb

## 2021-04-18 DIAGNOSIS — E559 Vitamin D deficiency, unspecified: Secondary | ICD-10-CM

## 2021-04-18 DIAGNOSIS — Z6833 Body mass index (BMI) 33.0-33.9, adult: Secondary | ICD-10-CM

## 2021-04-18 DIAGNOSIS — I10 Essential (primary) hypertension: Secondary | ICD-10-CM

## 2021-04-18 DIAGNOSIS — E1142 Type 2 diabetes mellitus with diabetic polyneuropathy: Secondary | ICD-10-CM

## 2021-04-18 DIAGNOSIS — M1A9XX0 Chronic gout, unspecified, without tophus (tophi): Secondary | ICD-10-CM

## 2021-04-18 DIAGNOSIS — E785 Hyperlipidemia, unspecified: Secondary | ICD-10-CM

## 2021-04-18 LAB — BAYER DCA HB A1C WAIVED: HB A1C (BAYER DCA - WAIVED): 8.8 % — ABNORMAL HIGH (ref <7.0)

## 2021-04-18 MED ORDER — ALLOPURINOL 300 MG PO TABS: 300.0000 mg | ORAL_TABLET | Freq: Every day | ORAL | 1 refills | Status: DC

## 2021-04-18 MED ORDER — GLIMEPIRIDE 4 MG PO TABS: 8.0000 mg | ORAL_TABLET | Freq: Every day | ORAL | 1 refills | Status: DC

## 2021-04-18 MED ORDER — BLOOD GLUCOSE METER KIT: PACK | 0 refills | Status: AC

## 2021-04-18 MED ORDER — PRAVASTATIN SODIUM 40 MG PO TABS: 40.0000 mg | ORAL_TABLET | Freq: Every day | ORAL | 1 refills | Status: DC

## 2021-04-18 MED ORDER — FENOFIBRATE 160 MG PO TABS: 160.0000 mg | ORAL_TABLET | Freq: Every day | ORAL | 1 refills | Status: DC

## 2021-04-18 MED ORDER — AMLODIPINE BESYLATE 5 MG PO TABS: 5.0000 mg | ORAL_TABLET | Freq: Every day | ORAL | 1 refills | Status: DC

## 2021-04-18 MED ORDER — LISINOPRIL 40 MG PO TABS: 40.0000 mg | ORAL_TABLET | Freq: Every day | ORAL | 1 refills | Status: DC

## 2021-04-18 MED ORDER — METFORMIN HCL 1000 MG PO TABS
1000.0000 mg | ORAL_TABLET | Freq: Two times a day (BID) | ORAL | 1 refills | Status: DC
Start: 2021-04-18 — End: 2021-08-24

## 2021-04-18 NOTE — Progress Notes (Signed)
Subjective:    Patient ID: Katelyn Ballard, female    DOB: 1945-09-04, 76 y.o.   MRN: [id number]   Chief Complaint: Medical Management of Chronic Issues    HPI:  1. Hyperlipidemia with target LDL less than 100 Does not watch diet. Does little to no exercise. Lab Results  Component Value Date   CHOL 161 12/05/2020   HDL 34 (L) 12/05/2020   LDLCALC 95 12/05/2020   TRIG 183 (H) 12/05/2020   CHOLHDL 4.7 (H) 12/05/2020     2. Type 2 diabetes mellitus with diabetic polyneuropathy, without long-term current use of insulin (HCC) She does not check her blood sugar at home anymore.  Lab Results  Component Value Date   HGBA1C 8.2 (H) 12/05/2020     3. Primary hypertension No c/o chest pain, sob or headache. Does not check blood pressure at home. BP Readings from Last 3 Encounters:  04/18/21 (!) 159/94  09/08/19 (!) 166/86  02/26/19 (!) 165/85     4. Vitamin D deficiency Usually takes a vitamin d supplement.  5. GOUT As long as she takes her allopurinol she has no flare ups.  6.  BMI 33.0-33.9,adult No recent weight changes Wt Readings from Last 3 Encounters:  04/18/21 188 lb (85.3 kg)  09/08/19 208 lb 8 oz (94.6 kg)  02/26/19 209 lb (94.8 kg)   BMI Readings from Last 3 Encounters:  04/18/21 33.30 kg/m  09/08/19 36.93 kg/m  02/26/19 37.02 kg/m       Outpatient Encounter Medications as of 04/18/2021  Medication Sig  . allopurinol (ZYLOPRIM) 300 MG tablet Take 1 tablet (300 mg total) by mouth daily.  Marland Kitchen amLODipine (NORVASC) 5 MG tablet Take 1 tablet (5 mg total) by mouth daily. (Needs to be seen before next refill)  . blood glucose meter kit and supplies Check blood sugar bid and as needed Dx: E11.42  . fenofibrate 160 MG tablet Take 1 tablet (160 mg total) by mouth daily. (Needs to be seen before next refill)  . glimepiride (AMARYL) 4 MG tablet TAKE 1 TABLET BY MOUTH IN THE MORNING AND 1/2 (ONE-HALF) IN THE EVENING (Needs to be seen before next refill)  .  glucose blood (ONETOUCH ULTRA) test strip USE 1 STRIP TO CHECK GLUCOSE 2 TO 3 TIMES DAILY  . lisinopril (ZESTRIL) 40 MG tablet Take 1 tablet (40 mg total) by mouth daily. (Needs to be seen before next refill)  . metFORMIN (GLUCOPHAGE) 1000 MG tablet Take 1 tablet (1,000 mg total) by mouth 2 (two) times daily with a meal.  . pravastatin (PRAVACHOL) 40 MG tablet Take 1 tablet (40 mg total) by mouth daily.  . [DISCONTINUED] apixaban (ELIQUIS) 5 MG TABS tablet Take 1 tablet (5 mg total) by mouth 2 (two) times daily.  . [DISCONTINUED] furosemide (LASIX) 20 MG tablet Take 1 tablet (20 mg total) by mouth daily.   No facility-administered encounter medications on file as of 04/18/2021.    Past Surgical History:  Procedure Laterality Date  . ABDOMINAL HYSTERECTOMY      Family History  Problem Relation Age of Onset  . Diabetes Mother   . Heart disease Mother   . Kidney disease Mother        blockage of renal artery  . Dementia Mother   . Heart disease Father   . Heart disease Sister   . Diabetes Sister   . Diabetes Sister     New complaints: Patient was in hospital with covid last month. When she got  out her daughter would not let her go back to her house because there were rats in the house. She is currently living with her son and that is not going well. Is still on oxygen at home several times a day.  Social history: Again is currently living with her son.  Controlled substance contract: n/a    Review of Systems  Constitutional: Negative for diaphoresis.  Eyes: Negative for pain.  Respiratory: Negative for shortness of breath.   Cardiovascular: Negative for chest pain, palpitations and leg swelling.  Gastrointestinal: Negative for abdominal pain.  Endocrine: Negative for polydipsia.  Skin: Negative for rash.  Neurological: Negative for dizziness, weakness and headaches.  Hematological: Does not bruise/bleed easily.  Psychiatric/Behavioral: Positive for confusion. The patient  is nervous/anxious.        Since covid  All other systems reviewed and are negative.      Objective:   Physical Exam Vitals and nursing note reviewed.  Constitutional:      General: She is not in acute distress.    Appearance: Normal appearance. She is well-developed.  HENT:     Head: Normocephalic.     Nose: Nose normal.  Eyes:     Pupils: Pupils are equal, round, and reactive to light.  Neck:     Vascular: No carotid bruit or JVD.  Cardiovascular:     Rate and Rhythm: Normal rate and regular rhythm.     Heart sounds: Normal heart sounds.  Pulmonary:     Effort: Pulmonary effort is normal. No respiratory distress.     Breath sounds: Normal breath sounds. No wheezing or rales.  Chest:     Chest wall: No tenderness.  Abdominal:     General: Bowel sounds are normal. There is no distension or abdominal bruit.     Palpations: Abdomen is soft. There is no hepatomegaly, splenomegaly, mass or pulsatile mass.     Tenderness: There is no abdominal tenderness.  Musculoskeletal:        General: Normal range of motion.     Cervical back: Normal range of motion and neck supple.     Comments: Gait slow and steady with cane  Lymphadenopathy:     Cervical: No cervical adenopathy.  Skin:    General: Skin is warm and dry.  Neurological:     Mental Status: She is alert and oriented to person, place, and time.     Deep Tendon Reflexes: Reflexes are normal and symmetric.  Psychiatric:        Behavior: Behavior normal.        Thought Content: Thought content normal.        Judgment: Judgment normal.     BP (!) 159/94   Pulse 79   Temp 98.6 F (37 C) (Temporal)   Resp 20   Ht _0  (1.6 m)   Wt 188 lb (85.3 kg)   SpO2 95%   BMI 33.30 kg/m   HGBA1c 8.8%      Assessment & Plan:  [name] comes in today with chief complaint of Medical Management of Chronic Issues   Diagnosis and orders addressed:  1. Hyperlipidemia with target LDL less than 100 Low fat diet -  Lipid panel - pravastatin (PRAVACHOL) 40 MG tablet; Take 1 tablet (40 mg total) by mouth daily.  Dispense: 90 tablet; Refill: 1 - fenofibrate 160 MG tablet; Take 1 tablet (160 mg total) by mouth daily. (Needs to be seen before next refill)  Dispense: 90 tablet; Refill: 1  2. Type 2 diabetes mellitus with diabetic polyneuropathy, without long-term current use of insulin (HCC) Increased amaryl to 2 tablets daily Continue metformin as prescribed - Bayer DCA Hb A1c Waived - Microalbumin / creatinine urine ratio - metFORMIN (GLUCOPHAGE) 1000 MG tablet; Take 1 tablet (1,000 mg total) by mouth 2 (two) times daily with a meal.  Dispense: 180 tablet; Refill: 1 - glimepiride (AMARYL) 4 MG tablet; Take 2 tablets (8 mg total) by mouth daily before breakfast.  Dispense: 180 tablet; Refill: 1  3. Primary hypertension Low sodium diet - CBC with Differential/Platelet - CMP14+EGFR - amLODipine (NORVASC) 5 MG tablet; Take 1 tablet (5 mg total) by mouth daily. (Needs to be seen before next refill)  Dispense: 90 tablet; Refill: 1 - lisinopril (ZESTRIL) 40 MG tablet; Take 1 tablet (40 mg total) by mouth daily. (Needs to be seen before next refill)  Dispense: 90 tablet; Refill: 1  4. Vitamin D deficiency Continue daily vitamin d supplement  5. BMI 33.0-33.9,adult Discussed diet and exercise for person with BMI >25 Will recheck weight in 3-6 months  6. Chronic gout without tophus, unspecified cause, unspecified site - allopurinol (ZYLOPRIM) 300 MG tablet; Take 1 tablet (300 mg total) by mouth daily.  Dispense: 90 tablet; Refill: 1   Labs pending Health Maintenance reviewed- needs eye exam Diet and exercise encouraged  Follow up plan: 3 months   Mary-Margaret Hassell Done, FNP

## 2021-04-18 NOTE — Patient Instructions (Signed)
Fall Prevention in the Home, Adult Falls can cause injuries and can happen to people of all ages. There are many things you can do to make your home safe and to help prevent falls. Ask for help when making these changes. What actions can I take to prevent falls? General Instructions  Use good lighting in all rooms. Replace any light bulbs that burn out.  Turn on the lights in dark areas. Use night-lights.  Keep items that you use often in easy-to-reach places. Lower the shelves around your home if needed.  Set up your furniture so you have a clear path. Avoid moving your furniture around.  Do not have throw rugs or other things on the floor that can make you trip.  Avoid walking on wet floors.  If any of your floors are uneven, fix them.  Add color or contrast paint or tape to clearly mark and help you see: ? Grab bars or handrails. ? First and last steps of staircases. ? Where the edge of each step is.  If you use a stepladder: ? Make sure that it is fully opened. Do not climb a closed stepladder. ? Make sure the sides of the stepladder are locked in place. ? Ask someone to hold the stepladder while you use it.  Know where your pets are when moving through your home. What can I do in the bathroom?  Keep the floor dry. Clean up any water on the floor right away.  Remove soap buildup in the tub or shower.  Use nonskid mats or decals on the floor of the tub or shower.  Attach bath mats securely with double-sided, nonslip rug tape.  If you need to sit down in the shower, use a plastic, nonslip stool.  Install grab bars by the toilet and in the tub and shower. Do not use towel bars as grab bars.      What can I do in the bedroom?  Make sure that you have a light by your bed that is easy to reach.  Do not use any sheets or blankets for your bed that hang to the floor.  Have a firm chair with side arms that you can use for support when you get dressed. What can I do in  the kitchen?  Clean up any spills right away.  If you need to reach something above you, use a step stool with a grab bar.  Keep electrical cords out of the way.  Do not use floor polish or wax that makes floors slippery. What can I do with my stairs?  Do not leave any items on the stairs.  Make sure that you have a light switch at the top and the bottom of the stairs.  Make sure that there are handrails on both sides of the stairs. Fix handrails that are broken or loose.  Install nonslip stair treads on all your stairs.  Avoid having throw rugs at the top or bottom of the stairs.  Choose a carpet that does not hide the edge of the steps on the stairs.  Check carpeting to make sure that it is firmly attached to the stairs. Fix carpet that is loose or worn. What can I do on the outside of my home?  Use bright outdoor lighting.  Fix the edges of walkways and driveways and fix any cracks.  Remove anything that might make you trip as you walk through a door, such as a raised step or threshold.  Trim any   bushes or trees on paths to your home.  Check to see if handrails are loose or broken and that both sides of all steps have handrails.  Install guardrails along the edges of any raised decks and porches.  Clear paths of anything that can make you trip, such as tools or rocks.  Have leaves, snow, or ice cleared regularly.  Use sand or salt on paths during winter.  Clean up any spills in your garage right away. This includes grease or oil spills. What other actions can I take?  Wear shoes that: ? Have a low heel. Do not wear high heels. ? Have rubber bottoms. ? Feel good on your feet and fit well. ? Are closed at the toe. Do not wear open-toe sandals.  Use tools that help you move around if needed. These include: ? Canes. ? Walkers. ? Scooters. ? Crutches.  Review your medicines with your doctor. Some medicines can make you feel dizzy. This can increase your chance  of falling. Ask your doctor what else you can do to help prevent falls. Where to find more information  Centers for Disease Control and Prevention, [employer]: www.cdc.gov  National Institute on Aging: www.nia.nih.gov Contact a doctor if:  You are afraid of falling at home.  You feel weak, drowsy, or dizzy at home.  You fall at home. Summary  There are many simple things that you can do to make your home safe and to help prevent falls.  Ways to make your home safe include removing things that can make you trip and installing grab bars in the bathroom.  Ask for help when making these changes in your home. This information is not intended to replace advice given to you by your health care provider. Make sure you discuss any questions you have with your health care provider. Document Revised: 07/20/2020 Document Reviewed: 07/20/2020 Elsevier Patient Education  2021 Elsevier Inc.  

## 2021-04-19 ENCOUNTER — Other Ambulatory Visit: Payer: Self-pay

## 2021-04-19 DIAGNOSIS — F439 Reaction to severe stress, unspecified: Secondary | ICD-10-CM

## 2021-04-19 LAB — CMP14+EGFR
ALT: 8 IU/L (ref 0–32)
AST: 6 IU/L (ref 0–40)
Albumin/Globulin Ratio: 1.1 — ABNORMAL LOW (ref 1.2–2.2)
Albumin: 4.1 g/dL (ref 3.7–4.7)
Alkaline Phosphatase: 69 IU/L (ref 44–121)
BUN/Creatinine Ratio: 23 (ref 12–28)
BUN: 23 mg/dL (ref 8–27)
Bilirubin Total: 0.3 mg/dL (ref 0.0–1.2)
CO2: 23 mmol/L (ref 20–29)
Calcium: 9.7 mg/dL (ref 8.7–10.3)
Chloride: 104 mmol/L (ref 96–106)
Creatinine, Ser: 1 mg/dL (ref 0.57–1.00)
Globulin, Total: 3.7 g/dL (ref 1.5–4.5)
Glucose: 155 mg/dL — ABNORMAL HIGH (ref 65–99)
Potassium: 4.3 mmol/L (ref 3.5–5.2)
Sodium: 144 mmol/L (ref 134–144)
Total Protein: 7.8 g/dL (ref 6.0–8.5)
eGFR: 58 mL/min/{1.73_m2} — ABNORMAL LOW (ref >59)

## 2021-04-19 LAB — CBC WITH DIFFERENTIAL/PLATELET
Basophils Absolute: 0.1 10*3/uL (ref 0.0–0.2)
Basos: 1 %
EOS (ABSOLUTE): 0.3 10*3/uL (ref 0.0–0.4)
Eos: 3 %
Hematocrit: 37.9 % (ref 34.0–46.6)
Hemoglobin: 11.6 g/dL (ref 11.1–15.9)
Immature Grans (Abs): 0.1 10*3/uL (ref 0.0–0.1)
Immature Granulocytes: 1 %
Lymphocytes Absolute: 2.4 10*3/uL (ref 0.7–3.1)
Lymphs: 27 %
MCH: 25.3 pg — ABNORMAL LOW (ref 26.6–33.0)
MCHC: 30.6 g/dL — ABNORMAL LOW (ref 31.5–35.7)
MCV: 83 fL (ref 79–97)
Monocytes Absolute: 0.7 10*3/uL (ref 0.1–0.9)
Monocytes: 8 %
Neutrophils Absolute: 5.4 10*3/uL (ref 1.4–7.0)
Neutrophils: 60 %
Platelets: 408 10*3/uL (ref 150–450)
RBC: 4.59 x10E6/uL (ref 3.77–5.28)
RDW: 15.4 % (ref 11.7–15.4)
WBC: 8.9 10*3/uL (ref 3.4–10.8)

## 2021-04-19 LAB — LIPID PANEL
Chol/HDL Ratio: 5.7 ratio — ABNORMAL HIGH (ref 0.0–4.4)
Cholesterol, Total: 198 mg/dL (ref 100–199)
HDL: 35 mg/dL — ABNORMAL LOW (ref >39)
LDL Chol Calc (NIH): 125 mg/dL — ABNORMAL HIGH (ref 0–99)
Triglycerides: 213 mg/dL — ABNORMAL HIGH (ref 0–149)
VLDL Cholesterol Cal: 38 mg/dL (ref 5–40)

## 2021-04-19 LAB — MICROALBUMIN / CREATININE URINE RATIO
Creatinine, Urine: 119.4 mg/dL
Microalb/Creat Ratio: 47 mg/g creat — ABNORMAL HIGH (ref 0–29)
Microalbumin, Urine: 55.7 ug/mL

## 2021-04-20 ENCOUNTER — Other Ambulatory Visit: Payer: Self-pay | Admitting: Nurse Practitioner

## 2021-04-20 MED ORDER — ROSUVASTATIN CALCIUM 10 MG PO TABS: 10.0000 mg | ORAL_TABLET | Freq: Every day | ORAL | 1 refills | Status: DC

## 2021-04-21 ENCOUNTER — Telehealth: Payer: Self-pay | Admitting: Nurse Practitioner

## 2021-04-21 NOTE — Telephone Encounter (Signed)
   Telephone encounter was:  Unsuccessful.  04/21/2021 Name: KAARIN PARDY MRN: [id number] DOB: 30-Jun-1945  Unsuccessful outbound call made today to assist with:  income based housing  Outreach Attempt:  1st Attempt  A HIPAA compliant voice message was left requesting a return call.  Instructed patient to call back at [phone].  Rojelio Brenner Care Guide, Embedded Care Coordination [facility], Care Management Phone: [phone] Email: [email or web address]

## 2021-05-08 DIAGNOSIS — U071 COVID-19: Secondary | ICD-10-CM | POA: Diagnosis not present

## 2021-05-17 ENCOUNTER — Telehealth: Payer: Self-pay | Admitting: *Deleted

## 2021-05-17 ENCOUNTER — Telehealth: Payer: Self-pay | Admitting: Nurse Practitioner

## 2021-05-17 ENCOUNTER — Ambulatory Visit (INDEPENDENT_AMBULATORY_CARE_PROVIDER_SITE_OTHER): Payer: Medicare Other | Admitting: Licensed Clinical Social Worker

## 2021-05-17 DIAGNOSIS — E1142 Type 2 diabetes mellitus with diabetic polyneuropathy: Secondary | ICD-10-CM | POA: Diagnosis not present

## 2021-05-17 DIAGNOSIS — E559 Vitamin D deficiency, unspecified: Secondary | ICD-10-CM

## 2021-05-17 DIAGNOSIS — E785 Hyperlipidemia, unspecified: Secondary | ICD-10-CM | POA: Diagnosis not present

## 2021-05-17 DIAGNOSIS — M1A9XX0 Chronic gout, unspecified, without tophus (tophi): Secondary | ICD-10-CM

## 2021-05-17 DIAGNOSIS — I10 Essential (primary) hypertension: Secondary | ICD-10-CM | POA: Diagnosis not present

## 2021-05-17 NOTE — Patient Instructions (Signed)
Visit Information  PATIENT GOALS: Goals Addressed            This Visit's Progress   . Protect My Health;Manage housing needs       Timeframe:  Short-Term Goal Priority:  Medium Progress; On Track Start Date:             05/17/21                Expected End Date:           08/17/21            Follow Up Date 06/27/21    Protect My Health (Patient)    Manage anxiety issues; manage housing issues     Why is this important?    Screening tests can find diseases early when they are easier to treat.   Your doctor or nurse will talk with you about which tests are important for you.   Getting shots for common diseases like the flu and shingles will help prevent them.     Patient Coping Skills Completes ADLs as needed Takes medications as scheduled Attends scheduled medical appointments Drives to appointments  Patient Deficits: Housing challenges Mobility challenges  Patient Goals:   Call LCSW or RNCM in next 30 days as needed for support Attend scheduled medical appointments in next 30 days Take prescribed medications as ordered  -  Follow Up Plan: LCSW to call client on 06/27/21       Kelton Pillar.Willam Munford MSW, LCSW Licensed Clinical Social Worker [facility] Care Management [phone]

## 2021-05-17 NOTE — Chronic Care Management (AMB) (Signed)
Chronic Care Management    Clinical Social Work Note  05/17/2021 Name: Katelyn Ballard MRN: [id number] DOB: 1945/11/20  Katelyn Ballard is a 76 y.o. year old female who is a primary care patient of Chevis Pretty, [place]. The CCM team was consulted to assist the patient with chronic disease management and/or care coordination needs related to: [employer] .   Engaged with patient by telephone for initial visit in response to provider referral for social work chronic care management and care coordination services.   Consent to Services:  The patient was given the following information about Chronic Care Management services today, agreed to services, and gave verbal consent: 1. CCM service includes personalized support from designated clinical staff supervised by the primary care provider, including individualized plan of care and coordination with other care providers 2. 24/7 contact phone numbers for assistance for urgent and routine care needs. 3. Service will only be billed when office clinical staff spend 20 minutes or more in a month to coordinate care. 4. Only one practitioner may furnish and bill the service in a calendar month. 5.The patient may stop CCM services at any time (effective at the end of the month) by phone call to the office staff. 6. The patient will be responsible for cost sharing (co-pay) of up to 20% of the service fee (after annual deductible is met). Patient agreed to services and consent obtained.  Patient agreed to services and consent obtained.   Assessment: Review of patient past medical history, allergies, medications, and health status, including review of relevant consultants reports was performed today as part of a comprehensive evaluation and provision of chronic care management and care coordination services.     SDOH (Social Determinants of Health) assessments and interventions performed:  SDOH Interventions   Flowsheet Row Most Recent Value   SDOH Interventions   Depression Interventions/Treatment  --  [informed client of LCSW support and of RNCM support]       Advanced Directives Status: See Vynca application for related entries.  CCM Care Plan  Allergies  Allergen Reactions  . Atorvastatin Other (See Comments)    myalgias  . Penicillins     Outpatient Encounter Medications as of 05/17/2021  Medication Sig  . allopurinol (ZYLOPRIM) 300 MG tablet Take 1 tablet (300 mg total) by mouth daily.  Marland Kitchen amLODipine (NORVASC) 5 MG tablet Take 1 tablet (5 mg total) by mouth daily. (Needs to be seen before next refill)  . blood glucose meter kit and supplies Check blood sugar bid and as needed Dx: E11.42  . blood glucose meter kit and supplies Dispense based on patient and insurance preference. Use up to four times daily as directed. (FOR ICD-10 E10.9, E11.9).  . fenofibrate 160 MG tablet Take 1 tablet (160 mg total) by mouth daily. (Needs to be seen before next refill)  . glimepiride (AMARYL) 4 MG tablet Take 2 tablets (8 mg total) by mouth daily before breakfast.  . glucose blood (ONETOUCH ULTRA) test strip USE 1 STRIP TO CHECK GLUCOSE 2 TO 3 TIMES DAILY  . lisinopril (ZESTRIL) 40 MG tablet Take 1 tablet (40 mg total) by mouth daily. (Needs to be seen before next refill)  . metFORMIN (GLUCOPHAGE) 1000 MG tablet Take 1 tablet (1,000 mg total) by mouth 2 (two) times daily with a meal.  . rosuvastatin (CRESTOR) 10 MG tablet Take 1 tablet (10 mg total) by mouth daily.   No facility-administered encounter medications on file as of 05/17/2021.    Patient  Active Problem List   Diagnosis Date Noted  . Trigger finger, left middle finger 02/26/2019  . BMI 33.0-33.9,adult 10/04/2015  . Vitamin D deficiency 04/08/2015  . Diabetes ([place]) 05/03/2014  . Hypertension 10/22/2013  . Hyperlipidemia with target LDL less than 100 10/22/2013  . Gout 10/22/2013    Conditions to be addressed/monitored: Monitor housing needs of client; monitor  anxiety issues of client   Care Plan : LCSW care plan  Updates made by Katha Cabal, LCSW since 05/17/2021 12:00 AM    Problem: Coping Skills (General Plan of Care)     Goal: Coping Skills Enhanced: Manage housing needs   Start Date: 05/17/2021  Expected End Date: 08/17/2021  This Visit's Progress: On track  Priority: Medium  Note:   Current barriers:   Housing challenges Mobility issues Breathing challenges  Clinical Goals:  LCSW to call client in next 30 days to discuss housing needs of client Client to call LCSW or RNCM as needed for CCM support in next 30 days  Clinical Interventions:  . Collaboration with Chevis Pretty, FNP regarding development and update of comprehensive plan of care as evidenced by provider attestation and co-signature . Talked with client about needs of client . Talked with client about housing needs (she lives at home of her son) . Talked with client about medication procurement . Talked with client about meal provision of client . Talked with client about sleeping issues of client . Talked with client about relaxation techniques (used to love to sew, enjoys watching birds at birdfeeder, cares for pet dog) . Talked with client about mobility of client . Provided counseling support for client . Discussed DME of client (has cane and has a walker) . Talked with client about upcoming medical appointments . Encouraged client to call RNCM as needed for CCM support   Patient Coping Skills Completes ADLs as needed Takes medications as scheduled Attends scheduled medical appointments Drives to appointments  Patient Deficits: Housing challenges Mobility challenges  Patient Goals:   Call LCSW or RNCM in next 30 days as needed for support Attend scheduled medical appointments in next 30 days Take prescribed medications as ordered  -  Follow Up Plan: LCSW to call client on 06/27/21     Norva Riffle.Augustina Braddock MSW, LCSW Licensed Clinical  Social Worker [facility] Care Management [phone]

## 2021-05-17 NOTE — Chronic Care Management (AMB) (Signed)
  Chronic Care Management   Note  05/17/2021 Name: RUDENE POULSEN MRN: [id number] DOB: [birth year]  CHANIAH CISSE is a 76 y.o. year old female who is a primary care patient of Chevis Pretty, [place]. I reached out to [name] by phone today in response to a referral sent by Ms. Rosendo Gros Daigle's PCP, Chevis Pretty, FNP.     Ms. Blow was given information about Chronic Care Management services today including:  1. CCM service includes personalized support from designated clinical staff supervised by her physician, including individualized plan of care and coordination with other care providers 2. 24/7 contact phone numbers for assistance for urgent and routine care needs. 3. Service will only be billed when office clinical staff spend 20 minutes or more in a month to coordinate care. 4. Only one practitioner may furnish and bill the service in a calendar month. 5. The patient may stop CCM services at any time (effective at the end of the month) by phone call to the office staff. 6. The patient will be responsible for cost sharing (co-pay) of up to 20% of the service fee (after annual deductible is met).  Patient agreed to services and verbal consent obtained.   Follow up plan: Telephone appointment with care management team member scheduled for:05/18/2021  [place] Management

## 2021-05-17 NOTE — Telephone Encounter (Signed)
   Telephone encounter was:  Successful.  05/17/2021 Name: Katelyn Ballard MRN: [id number] DOB: October 03, 1945  Katelyn Ballard is a 76 y.o. year old female who is a primary care patient of Bennie Pierini, FNP . The community resource team was consulted for assistance with Food Insecurity and Financial Difficulties related to finding a new residence.   Care guide performed the following interventions: Patient provided with information about care guide support team and interviewed to confirm resource needs Discussed resources to assist with her current housing situation and food resources. After talking to patient and getting a full picture of her current living situation I believe that this pt is in need for higher intervention to help her. I don't believe just providing resouces for her are the only thing she needs.  Obtained verbal consent to place patient referral to our LCSW, Lorna Few to follow up with patient. I am going to close the referral for [facility] care guides, however if Lorin Picket feels that my opinion isn't correct I will be happy to reopen this referral and help with outside resources. .  Follow Up Plan:  No further follow up planned at this time. The patient has been provided with needed resources.  Rojelio Brenner Care Guide, Embedded Care Coordination [facility], Care Management Phone: [phone] Email: [email or web address]

## 2021-05-18 ENCOUNTER — Telehealth: Payer: Medicare Other

## 2021-06-08 DIAGNOSIS — U071 COVID-19: Secondary | ICD-10-CM | POA: Diagnosis not present

## 2021-06-12 ENCOUNTER — Other Ambulatory Visit: Payer: Self-pay | Admitting: Nurse Practitioner

## 2021-06-12 DIAGNOSIS — E785 Hyperlipidemia, unspecified: Secondary | ICD-10-CM

## 2021-06-12 DIAGNOSIS — E1142 Type 2 diabetes mellitus with diabetic polyneuropathy: Secondary | ICD-10-CM

## 2021-06-12 MED ORDER — GLIMEPIRIDE 4 MG PO TABS: 8.0000 mg | ORAL_TABLET | Freq: Every day | ORAL | 0 refills | Status: DC

## 2021-06-12 NOTE — Addendum Note (Signed)
Addended by: Julious Payer D on: 06/12/2021 04:36 PM   Modules accepted: Orders

## 2021-06-13 DIAGNOSIS — R918 Other nonspecific abnormal finding of lung field: Secondary | ICD-10-CM | POA: Diagnosis not present

## 2021-06-13 DIAGNOSIS — Z87891 Personal history of nicotine dependence: Secondary | ICD-10-CM | POA: Diagnosis not present

## 2021-06-13 DIAGNOSIS — I517 Cardiomegaly: Secondary | ICD-10-CM | POA: Diagnosis not present

## 2021-06-13 DIAGNOSIS — U071 COVID-19: Secondary | ICD-10-CM | POA: Diagnosis not present

## 2021-06-13 DIAGNOSIS — J439 Emphysema, unspecified: Secondary | ICD-10-CM | POA: Diagnosis not present

## 2021-06-13 DIAGNOSIS — E119 Type 2 diabetes mellitus without complications: Secondary | ICD-10-CM | POA: Diagnosis not present

## 2021-06-13 DIAGNOSIS — Z880 Allergy status to penicillin: Secondary | ICD-10-CM | POA: Diagnosis not present

## 2021-06-13 DIAGNOSIS — Z79899 Other long term (current) drug therapy: Secondary | ICD-10-CM | POA: Diagnosis not present

## 2021-06-13 DIAGNOSIS — E782 Mixed hyperlipidemia: Secondary | ICD-10-CM | POA: Diagnosis not present

## 2021-06-13 DIAGNOSIS — R5383 Other fatigue: Secondary | ICD-10-CM | POA: Diagnosis not present

## 2021-06-13 DIAGNOSIS — J90 Pleural effusion, not elsewhere classified: Secondary | ICD-10-CM | POA: Diagnosis not present

## 2021-06-13 DIAGNOSIS — R0602 Shortness of breath: Secondary | ICD-10-CM | POA: Diagnosis not present

## 2021-06-13 DIAGNOSIS — Z7984 Long term (current) use of oral hypoglycemic drugs: Secondary | ICD-10-CM | POA: Diagnosis not present

## 2021-06-27 ENCOUNTER — Telehealth: Payer: Medicare Other

## 2021-07-08 DIAGNOSIS — U071 COVID-19: Secondary | ICD-10-CM | POA: Diagnosis not present

## 2021-07-10 ENCOUNTER — Ambulatory Visit (INDEPENDENT_AMBULATORY_CARE_PROVIDER_SITE_OTHER): Payer: Medicare Other | Admitting: Licensed Clinical Social Worker

## 2021-07-10 DIAGNOSIS — M1A9XX0 Chronic gout, unspecified, without tophus (tophi): Secondary | ICD-10-CM

## 2021-07-10 DIAGNOSIS — I10 Essential (primary) hypertension: Secondary | ICD-10-CM

## 2021-07-10 DIAGNOSIS — E559 Vitamin D deficiency, unspecified: Secondary | ICD-10-CM

## 2021-07-10 DIAGNOSIS — E785 Hyperlipidemia, unspecified: Secondary | ICD-10-CM | POA: Diagnosis not present

## 2021-07-10 DIAGNOSIS — E1142 Type 2 diabetes mellitus with diabetic polyneuropathy: Secondary | ICD-10-CM

## 2021-07-10 NOTE — Chronic Care Management (AMB) (Signed)
Chronic Care Management    Clinical Social Work Note  07/10/2021 Name: Katelyn Ballard MRN: [id number] DOB: June 26, 1945  Katelyn Ballard is a 76 y.o. year old female who is a primary care patient of Chevis Pretty, [place]. The CCM team was consulted to assist the patient with chronic disease management and/or care coordination needs related to: [employer] .   Engaged with patient by telephone for follow up visit in response to provider referral for social work chronic care management and care coordination services.   Consent to Services:  The patient was given information about Chronic Care Management services, agreed to services, and gave verbal consent prior to initiation of services.  Please see initial visit note for detailed documentation.   Patient agreed to services and consent obtained.   Assessment: Review of patient past medical history, allergies, medications, and health status, including review of relevant consultants reports was performed today as part of a comprehensive evaluation and provision of chronic care management and care coordination services.     SDOH (Social Determinants of Health) assessments and interventions performed:  SDOH Interventions    Flowsheet Row Most Recent Value  SDOH Interventions   Depression Interventions/Treatment  --  [informed client of LCSW support and of RNCM support]        Advanced Directives Status: See Vynca application for related entries.  CCM Care Plan  Allergies  Allergen Reactions   Atorvastatin Other (See Comments)    myalgias   Penicillins     Outpatient Encounter Medications as of 07/10/2021  Medication Sig   allopurinol (ZYLOPRIM) 300 MG tablet Take 1 tablet (300 mg total) by mouth daily.   amLODipine (NORVASC) 5 MG tablet Take 1 tablet (5 mg total) by mouth daily. (Needs to be seen before next refill)   blood glucose meter kit and supplies Check blood sugar bid and as needed Dx: E11.42   blood glucose  meter kit and supplies Dispense based on patient and insurance preference. Use up to four times daily as directed. (FOR ICD-10 E10.9, E11.9).   fenofibrate 160 MG tablet Take 1 tablet (160 mg total) by mouth daily. (Needs to be seen before next refill)   glimepiride (AMARYL) 4 MG tablet Take 2 tablets (8 mg total) by mouth daily before breakfast.   glucose blood (ONETOUCH ULTRA) test strip USE 1 STRIP TO CHECK GLUCOSE 2 TO 3 TIMES DAILY   lisinopril (ZESTRIL) 40 MG tablet Take 1 tablet (40 mg total) by mouth daily. (Needs to be seen before next refill)   metFORMIN (GLUCOPHAGE) 1000 MG tablet Take 1 tablet (1,000 mg total) by mouth 2 (two) times daily with a meal.   rosuvastatin (CRESTOR) 10 MG tablet Take 1 tablet (10 mg total) by mouth daily.   No facility-administered encounter medications on file as of 07/10/2021.    Patient Active Problem List   Diagnosis Date Noted   Trigger finger, left middle finger 02/26/2019   BMI 33.0-33.9,adult 10/04/2015   Vitamin D deficiency 04/08/2015   Diabetes ([place]) 05/03/2014   Hypertension 10/22/2013   Hyperlipidemia with target LDL less than 100 10/22/2013   Gout 10/22/2013    Conditions to be addressed/monitored: Monitor client management of housing needs of client   Care Plan : LCSW care plan  Updates made by Katha Cabal, LCSW since 07/10/2021 12:00 AM     Problem: Coping Skills (General Plan of Care)      Goal: Coping Skills Enhanced: Manage housing needs   Start Date: 07/10/2021  Expected End Date: 09/28/2021  This Visit's Progress: On track  Recent Progress: On track  Priority: Medium  Note:   Current barriers:   Housing challenges Mobility issues Breathing challenges  Clinical Goals:  LCSW to call client in next 30 days to discuss housing needs of client Client to call LCSW or RNCM as needed for CCM support in next 30 days  Clinical Interventions:  Collaboration with Chevis Pretty, [place] regarding development and  update of comprehensive plan of care as evidenced by provider attestation and co-signature Talked with client about needs of client Talked with client about mobility of client (she uses a walker to help her walk) Talked with client about decreased energy of client Talked with client about housing needs (she lives at home of her son) Talked with client about medication procurement Talked with client about meal provision of client Talked with client about sleeping issues of client (client said she has a hard time sleeping at night) Talked with client about relaxation techniques (used to love to sew, enjoys watching birds at birdfeeder, cares for pet dog) Talked with client about vision of client Provided counseling support for client Discussed DME of client (has cane and has a walker) Talked with client about upcoming medical appointments Encouraged client to call RNCM as needed for CCM support Talked with client about pain of client (she said she has some pain , some numbness in her left leg and left foot ) Talked with client about support from her daughter (her daughter has been helping client to pay bills each month) Talked with client about church support (has friends from church who call to check on her status) Talked with Olin Hauser about CCM program support  Patient Coping Skills Completes ADLs as needed Takes medications as scheduled Attends scheduled medical appointments Drives to appointments  Patient Deficits: Housing challenges Mobility challenges  Patient Goals:   Call LCSW or RNCM in next 30 days as needed for support Attend scheduled medical appointments in next 30 days Take prescribed medications as ordered -  Follow Up Plan: LCSW to call client on 08/17/21      Norva Riffle.Qunicy Higinbotham MSW, LCSW Licensed Clinical Social Worker [facility] Care Management [phone]

## 2021-07-10 NOTE — Patient Instructions (Signed)
Visit Information  PATIENT GOALS:  Goals Addressed             This Visit's Progress    Protect My Health;Manage housing needs       Timeframe:  Short-Term Goal Priority:  Medium Progress; On Track Start Date:             07/10/21                Expected End Date:           09/28/21            Follow Up Date 08/17/21    Protect My Health (Patient)    Manage anxiety issues; manage housing issues     Why is this important?   Screening tests can find diseases early when they are easier to treat.  Your doctor or nurse will talk with you about which tests are important for you.  Getting shots for common diseases like the flu and shingles will help prevent them.     Patient Coping Skills Completes ADLs as needed Takes medications as scheduled Attends scheduled medical appointments Drives to appointments  Patient Deficits: Housing challenges Mobility challenges  Patient Goals:   Call LCSW or RNCM in next 30 days as needed for support Attend scheduled medical appointments in next 30 days Take prescribed medications as ordered -  Follow Up Plan: LCSW to call client on 08/17/21     Kelton Pillar.Aidon Klemens MSW, LCSW Licensed Clinical Social Worker [facility] Care Management [phone]

## 2021-07-18 ENCOUNTER — Ambulatory Visit: Payer: Self-pay | Admitting: Nurse Practitioner

## 2021-07-20 ENCOUNTER — Encounter: Payer: Self-pay | Admitting: Nurse Practitioner

## 2021-08-08 ENCOUNTER — Other Ambulatory Visit: Payer: Self-pay | Admitting: Nurse Practitioner

## 2021-08-08 DIAGNOSIS — R0609 Other forms of dyspnea: Secondary | ICD-10-CM

## 2021-08-08 DIAGNOSIS — R0600 Dyspnea, unspecified: Secondary | ICD-10-CM

## 2021-08-15 ENCOUNTER — Other Ambulatory Visit: Payer: Self-pay | Admitting: Nurse Practitioner

## 2021-08-15 DIAGNOSIS — R0600 Dyspnea, unspecified: Secondary | ICD-10-CM

## 2021-08-15 DIAGNOSIS — R0609 Other forms of dyspnea: Secondary | ICD-10-CM

## 2021-08-17 ENCOUNTER — Ambulatory Visit (INDEPENDENT_AMBULATORY_CARE_PROVIDER_SITE_OTHER): Payer: Medicare Other | Admitting: Licensed Clinical Social Worker

## 2021-08-17 ENCOUNTER — Telehealth: Payer: Self-pay | Admitting: *Deleted

## 2021-08-17 DIAGNOSIS — I10 Essential (primary) hypertension: Secondary | ICD-10-CM | POA: Diagnosis not present

## 2021-08-17 DIAGNOSIS — E785 Hyperlipidemia, unspecified: Secondary | ICD-10-CM

## 2021-08-17 DIAGNOSIS — E559 Vitamin D deficiency, unspecified: Secondary | ICD-10-CM

## 2021-08-17 DIAGNOSIS — M1A9XX0 Chronic gout, unspecified, without tophus (tophi): Secondary | ICD-10-CM

## 2021-08-17 DIAGNOSIS — E1142 Type 2 diabetes mellitus with diabetic polyneuropathy: Secondary | ICD-10-CM

## 2021-08-17 NOTE — Telephone Encounter (Signed)
Number not accepting call

## 2021-08-17 NOTE — Telephone Encounter (Signed)
08/17/2021  Patient talked with Lorna Few, LCSW today. She is due for a 3 month follow-up with Gennette Pac. At that appointment she needs a face-to-face visit to order an upright walker and she would like her medication refills sent to [facility] so that they can deliver to her home.   Forwarding to [facility] Clinical Staff to reach out and schedule an appointment.   Demetrios Loll, BSN, RN-BC Embedded Chronic Care Manager [facility] Care Management Direct Dial: [phone]

## 2021-08-17 NOTE — Chronic Care Management (AMB) (Signed)
Chronic Care Management    Clinical Social Work Note  08/17/2021 Name: Katelyn Ballard MRN: [id number] DOB: 1945-12-31  Katelyn Ballard is a 76 y.o. year old female who is a primary care patient of Chevis Pretty, [place]. The CCM team was consulted to assist the patient with chronic disease management and/or care coordination needs related to: [employer] .   Engaged with patient by telephone for follow up visit in response to provider referral for social work chronic care management and care coordination services.   Consent to Services:  The patient was given information about Chronic Care Management services, agreed to services, and gave verbal consent prior to initiation of services.  Please see initial visit note for detailed documentation.   Patient agreed to services and consent obtained.   Assessment: Review of patient past medical history, allergies, medications, and health status, including review of relevant consultants reports was performed today as part of a comprehensive evaluation and provision of chronic care management and care coordination services.     SDOH (Social Determinants of Health) assessments and interventions performed:  SDOH Interventions    Flowsheet Row Most Recent Value  SDOH Interventions   Physical Activity Interventions Other (Comments)  [client has difficulty walking,  she uses a walker to ambulate]  Depression Interventions/Treatment  --  [informed client of LCSW support and of RNCM support]        Advanced Directives Status: See Vynca application for related entries.  CCM Care Plan  Allergies  Allergen Reactions   Atorvastatin Other (See Comments)    myalgias   Penicillins     Outpatient Encounter Medications as of 08/17/2021  Medication Sig   allopurinol (ZYLOPRIM) 300 MG tablet Take 1 tablet (300 mg total) by mouth daily.   amLODipine (NORVASC) 5 MG tablet Take 1 tablet (5 mg total) by mouth daily. (Needs to be seen before  next refill)   blood glucose meter kit and supplies Check blood sugar bid and as needed Dx: E11.42   blood glucose meter kit and supplies Dispense based on patient and insurance preference. Use up to four times daily as directed. (FOR ICD-10 E10.9, E11.9).   fenofibrate 160 MG tablet Take 1 tablet (160 mg total) by mouth daily. (Needs to be seen before next refill)   glimepiride (AMARYL) 4 MG tablet Take 2 tablets (8 mg total) by mouth daily before breakfast.   glucose blood (ONETOUCH ULTRA) test strip USE 1 STRIP TO CHECK GLUCOSE 2 TO 3 TIMES DAILY   lisinopril (ZESTRIL) 40 MG tablet Take 1 tablet (40 mg total) by mouth daily. (Needs to be seen before next refill)   metFORMIN (GLUCOPHAGE) 1000 MG tablet Take 1 tablet (1,000 mg total) by mouth 2 (two) times daily with a meal.   rosuvastatin (CRESTOR) 10 MG tablet Take 1 tablet (10 mg total) by mouth daily.   No facility-administered encounter medications on file as of 08/17/2021.    Patient Active Problem List   Diagnosis Date Noted   Trigger finger, left middle finger 02/26/2019   BMI 33.0-33.9,adult 10/04/2015   Vitamin D deficiency 04/08/2015   Diabetes ([place]) 05/03/2014   Hypertension 10/22/2013   Hyperlipidemia with target LDL less than 100 10/22/2013   Gout 10/22/2013    Conditions to be addressed/monitored:monitor client management of housing needs of client    Care Plan : LCSW care plan  Updates made by Katha Cabal, LCSW since 08/17/2021 12:00 AM     Problem: Coping Skills (General Plan of  Care)      Goal: Coping Skills Enhanced: Manage housing needs   Start Date: 08/17/2021  Expected End Date: 11/16/2021  This Visit's Progress: On track  Recent Progress: On track  Priority: Medium  Note:   Current barriers:   Housing challenges Mobility issues Breathing challenges  Clinical Goals:  LCSW to call client in next 30 days to discuss housing needs of client Client to call LCSW or RNCM as needed for CCM support in  next 30 days Client to attend all scheduled medical appointments in next 30 days  Clinical Interventions:  Collaboration with Chevis Pretty, FNP regarding development and update of comprehensive plan of care as evidenced by provider attestation and co-signature Talked with client about needs of client Talked with client about mobility of client (she uses a walker to help her walk) Talked with client about decreased energy of client Talked with client about housing needs (she lives at home of her son) Talked with client about medication procurement Talked with client about sleeping issues of client (client said she has a hard time sleeping at night) Talked with client about relaxation techniques (used to love to sew, enjoys watching birds at birdfeeder, cares for pet dog) Provided counseling support for client Talked with client about her plans to return to her own home. She said she has been having repairs done to her own home.  She said it may be 2-4 weeks before her home is ready for her to return Talked with client about her use of oxygen as needed in the home. Talked with client about upcoming medical appointments Encouraged client to call RNCM as needed for CCM support Talked with client about pain of client (she said she has some pain , some numbness in her left leg and left foot ) Talked with client about support from her daughter (her daughter has been helping client to pay bills each month) Talked with client about church support (has friends from church who call to check on her status) Talked with [name] about CCM program support Talked with client about medication procurement. She said she would like to switch to a pharmacy that could deliver her prescribed medications to her residence Collaborated with [place] today about current needs of client  Patient Coping Skills Completes ADLs as needed Takes medications as scheduled Attends scheduled medical appointments Drives to  appointments  Patient Deficits: Housing challenges Mobility challenges  Patient Goals:   Call LCSW or RNCM in next 30 days as needed for support Attend scheduled medical appointments in next 30 days Take prescribed medications as ordered -  Follow Up Plan: LCSW to call client on 09/27/21 to assess client needs      Norva Riffle.Miyanna Wiersma MSW, LCSW Licensed Clinical Social Worker [facility] Care Management [phone]

## 2021-08-17 NOTE — Patient Instructions (Signed)
Visit Information  PATIENT GOALS:  Goals Addressed             This Visit's Progress    Manage My Emotions       Protect My Health;Manage housing needs       Timeframe:  Short-Term Goal Priority:  Medium Progress; On Track Start Date:             08/17/21                Expected End Date:           11/17/21            Follow Up Date 09/27/21   Protect My Health (Patient)    Manage anxiety issues; manage housing issues     Why is this important?   Screening tests can find diseases early when they are easier to treat.  Your doctor or nurse will talk with you about which tests are important for you.  Getting shots for common diseases like the flu and shingles will help prevent them.     Patient Coping Skills Completes ADLs as needed Takes medications as scheduled Attends scheduled medical appointments Drives to appointments  Patient Deficits: Housing challenges Mobility challenges  Patient Goals:   Call LCSW or RNCM in next 30 days as needed for support Attend scheduled medical appointments in next 30 days Take prescribed medications as ordered -  Follow Up Plan: LCSW to call client on 09/27/21 to assess client needs     Katelyn Ballard.Katelyn Ballard MSW, LCSW Licensed Clinical Social Worker [facility] Care Management [phone]

## 2021-08-22 ENCOUNTER — Telehealth: Payer: Medicare Other | Admitting: *Deleted

## 2021-08-23 ENCOUNTER — Other Ambulatory Visit: Payer: Self-pay | Admitting: Nurse Practitioner

## 2021-08-23 DIAGNOSIS — R0609 Other forms of dyspnea: Secondary | ICD-10-CM

## 2021-08-23 DIAGNOSIS — R0600 Dyspnea, unspecified: Secondary | ICD-10-CM

## 2021-08-24 ENCOUNTER — Ambulatory Visit (INDEPENDENT_AMBULATORY_CARE_PROVIDER_SITE_OTHER): Payer: Medicare Other | Admitting: Nurse Practitioner

## 2021-08-24 ENCOUNTER — Encounter: Payer: Self-pay | Admitting: Nurse Practitioner

## 2021-08-24 ENCOUNTER — Other Ambulatory Visit: Payer: Self-pay

## 2021-08-24 VITALS — BP 173/91 | HR 80 | Temp 98.1°F | Resp 20 | Ht 63.0 in | Wt 193.0 lb

## 2021-08-24 DIAGNOSIS — M1A9XX0 Chronic gout, unspecified, without tophus (tophi): Secondary | ICD-10-CM

## 2021-08-24 DIAGNOSIS — R2681 Unsteadiness on feet: Secondary | ICD-10-CM

## 2021-08-24 DIAGNOSIS — E1142 Type 2 diabetes mellitus with diabetic polyneuropathy: Secondary | ICD-10-CM

## 2021-08-24 DIAGNOSIS — I10 Essential (primary) hypertension: Secondary | ICD-10-CM | POA: Diagnosis not present

## 2021-08-24 DIAGNOSIS — Z6834 Body mass index (BMI) 34.0-34.9, adult: Secondary | ICD-10-CM

## 2021-08-24 DIAGNOSIS — E559 Vitamin D deficiency, unspecified: Secondary | ICD-10-CM

## 2021-08-24 DIAGNOSIS — E785 Hyperlipidemia, unspecified: Secondary | ICD-10-CM | POA: Diagnosis not present

## 2021-08-24 LAB — BAYER DCA HB A1C WAIVED: HB A1C (BAYER DCA - WAIVED): 9.3 % — ABNORMAL HIGH (ref <7.0)

## 2021-08-24 MED ORDER — LISINOPRIL 40 MG PO TABS: 40.0000 mg | ORAL_TABLET | Freq: Every day | ORAL | 1 refills | Status: DC

## 2021-08-24 MED ORDER — FENOFIBRATE 160 MG PO TABS: 160.0000 mg | ORAL_TABLET | Freq: Every day | ORAL | 1 refills | Status: DC

## 2021-08-24 MED ORDER — METFORMIN HCL 1000 MG PO TABS: 1000.0000 mg | ORAL_TABLET | Freq: Two times a day (BID) | ORAL | 1 refills | Status: DC

## 2021-08-24 MED ORDER — ALLOPURINOL 300 MG PO TABS: 300.0000 mg | ORAL_TABLET | Freq: Every day | ORAL | 1 refills | Status: AC

## 2021-08-24 MED ORDER — AMLODIPINE BESYLATE 10 MG PO TABS: 10.0000 mg | ORAL_TABLET | Freq: Every day | ORAL | 1 refills | Status: DC

## 2021-08-24 MED ORDER — ROSUVASTATIN CALCIUM 10 MG PO TABS: 10.0000 mg | ORAL_TABLET | Freq: Every day | ORAL | 1 refills | Status: DC

## 2021-08-24 MED ORDER — GLIMEPIRIDE 4 MG PO TABS: 8.0000 mg | ORAL_TABLET | Freq: Every day | ORAL | 0 refills | Status: DC

## 2021-08-24 NOTE — Addendum Note (Signed)
Addended by: Cleda Daub on: 08/24/2021 02:58 PM   Modules accepted: Orders

## 2021-08-24 NOTE — Progress Notes (Signed)
Subjective:    Patient ID: Katelyn Ballard, female    DOB: May 07, 1945, 76 y.o.   MRN: [id number]   Chief Complaint: medical management of chronic issues     HPI:  1. Primary hypertension No c/o chest pain, sob or headache. Does not check blood pressure at home. BP Readings from Last 3 Encounters:  04/18/21 (!) 159/94  09/08/19 (!) 166/86  02/26/19 (!) 165/85     2. Hyperlipidemia with target LDL less than 100 Does not watch diet and does no dedicated exercise. Lab Results  Component Value Date   CHOL 198 04/18/2021   HDL 35 (L) 04/18/2021   LDLCALC 125 (H) 04/18/2021   TRIG 213 (H) 04/18/2021   CHOLHDL 5.7 (H) 04/18/2021     3. Type 2 diabetes mellitus with diabetic polyneuropathy, without long-term current use of insulin (HCC) She does not check her blood sugars at home. We increased her amaryl to bid. She is not sure if she is taking it bid or not. Her son fixes her meds for her. Lab Results  Component Value Date   HGBA1C 8.8 (H) 04/18/2021     4. Chronic gout without tophus, unspecified cause, unspecified site Patient denies any recent flare ups  5. Vitamin D deficiency Takes daily vitamin d supplement when she remembers  6. BMI 33.0-33.9,adult No recent weight changes Wt Readings from Last 3 Encounters:  08/24/21 193 lb (87.5 kg)  04/18/21 188 lb (85.3 kg)  09/08/19 208 lb 8 oz (94.6 kg)   BMI Readings from Last 3 Encounters:  08/24/21 34.19 kg/m  04/18/21 33.30 kg/m  09/08/19 36.93 kg/m      Outpatient Encounter Medications as of 08/24/2021  Medication Sig   allopurinol (ZYLOPRIM) 300 MG tablet Take 1 tablet (300 mg total) by mouth daily.   amLODipine (NORVASC) 5 MG tablet Take 1 tablet (5 mg total) by mouth daily. (Needs to be seen before next refill)   blood glucose meter kit and supplies Check blood sugar bid and as needed Dx: E11.42   blood glucose meter kit and supplies Dispense based on patient and insurance preference. Use up to four  times daily as directed. (FOR ICD-10 E10.9, E11.9).   fenofibrate 160 MG tablet Take 1 tablet (160 mg total) by mouth daily. (Needs to be seen before next refill)   glimepiride (AMARYL) 4 MG tablet Take 2 tablets (8 mg total) by mouth daily before breakfast.   glucose blood (ONETOUCH ULTRA) test strip USE 1 STRIP TO CHECK GLUCOSE 2 TO 3 TIMES DAILY   lisinopril (ZESTRIL) 40 MG tablet Take 1 tablet (40 mg total) by mouth daily. (Needs to be seen before next refill)   metFORMIN (GLUCOPHAGE) 1000 MG tablet Take 1 tablet (1,000 mg total) by mouth 2 (two) times daily with a meal.   rosuvastatin (CRESTOR) 10 MG tablet Take 1 tablet (10 mg total) by mouth daily.   No facility-administered encounter medications on file as of 08/24/2021.    Past Surgical History:  Procedure Laterality Date   ABDOMINAL HYSTERECTOMY      Family History  Problem Relation Age of Onset   Diabetes Mother    Heart disease Mother    Kidney disease Mother        blockage of renal artery   Dementia Mother    Heart disease Father    Heart disease Sister    Diabetes Sister    Diabetes Sister     New complaints: None today  Social history: Lives  with her son  Controlled substance contract: n/a     Review of Systems  Constitutional:  Negative for diaphoresis.  Eyes:  Negative for pain.  Respiratory:  Negative for shortness of breath.   Cardiovascular:  Negative for chest pain, palpitations and leg swelling.  Gastrointestinal:  Negative for abdominal pain.  Endocrine: Negative for polydipsia.  Skin:  Negative for rash.  Neurological:  Negative for dizziness, weakness and headaches.  Hematological:  Does not bruise/bleed easily.  All other systems reviewed and are negative.     Objective:   Physical Exam Vitals and nursing note reviewed.  Constitutional:      General: She is not in acute distress.    Appearance: Normal appearance. She is well-developed.  HENT:     Head: Normocephalic.     Right  Ear: Tympanic membrane normal.     Left Ear: Tympanic membrane normal.     Nose: Nose normal.     Mouth/Throat:     Mouth: Mucous membranes are moist.  Eyes:     Pupils: Pupils are equal, round, and reactive to light.  Neck:     Vascular: No carotid bruit or JVD.  Cardiovascular:     Rate and Rhythm: Normal rate and regular rhythm.     Heart sounds: Normal heart sounds.  Pulmonary:     Effort: Pulmonary effort is normal. No respiratory distress.     Breath sounds: Normal breath sounds. No wheezing or rales.  Chest:     Chest wall: No tenderness.  Abdominal:     General: Bowel sounds are normal. There is no distension or abdominal bruit.     Palpations: Abdomen is soft. There is no hepatomegaly, splenomegaly, mass or pulsatile mass.     Tenderness: There is no abdominal tenderness.  Musculoskeletal:        General: Normal range of motion.     Cervical back: Normal range of motion and neck supple.  Lymphadenopathy:     Cervical: No cervical adenopathy.  Skin:    General: Skin is warm and dry.  Neurological:     Mental Status: She is alert and oriented to person, place, and time.     Deep Tendon Reflexes: Reflexes are normal and symmetric.  Psychiatric:        Behavior: Behavior normal.        Thought Content: Thought content normal.        Judgment: Judgment normal.    BP (!) 173/91   Pulse 80   Temp 98.1 F (36.7 C) (Temporal)   Resp 20   Ht 5' 3"  (1.6 m)   Wt 193 lb (87.5 kg)   SpO2 96%   BMI 34.19 kg/m   Hgba1c- 9.3%.     Assessment & Plan:  Katelyn Ballard comes in today with chief complaint of Medical Management of Chronic Issues   Diagnosis and orders addressed:  1. Primary hypertension Low sodium diet Increase norvasc to 43m daily - CBC with Differential/Platelet - CMP14+EGFR - amLODipine (NORVASC) 10 MG tablet; Take 1 tablet (10 mg total) by mouth daily.  Dispense: 90 tablet; Refill: 1 - lisinopril (ZESTRIL) 40 MG tablet; Take 1 tablet (40 mg  total) by mouth daily. (Needs to be seen before next refill)  Dispense: 90 tablet; Refill: 1  2. Hyperlipidemia with target LDL less than 100 Low fat diet - Lipid panel - fenofibrate 160 MG tablet; Take 1 tablet (160 mg total) by mouth daily. (Needs to be seen before next refill)  Dispense: 90 tablet; Refill: 1 - rosuvastatin (CRESTOR) 10 MG tablet; Take 1 tablet (10 mg total) by mouth daily.  Dispense: 90 tablet; Refill: 1  3. Type 2 diabetes mellitus with diabetic polyneuropathy, without long-term current use of insulin ([place]) Appointment to be made with clinical pharmacist to discuss diabetes - [facility] [place] Hb A1c Waived - For home use only DME 4 wheeled rolling walker with seat ([id number]) - glimepiride (AMARYL) 4 MG tablet; Take 2 tablets (8 mg total) by mouth daily before breakfast.  Dispense: 180 tablet; Refill: 0 - metFORMIN (GLUCOPHAGE) 1000 MG tablet; Take 1 tablet (1,000 mg total) by mouth 2 (two) times daily with a meal.  Dispense: 180 tablet; Refill: 1  4. Chronic gout without tophus, unspecified cause, unspecified site Low purine diet - allopurinol (ZYLOPRIM) 300 MG tablet; Take 1 tablet (300 mg total) by mouth daily.  Dispense: 90 tablet; Refill: 1  5. Vitamin D deficiency Continue daily vitamin supplement  6. BMI 34.0-34.9,adult Discussed diet and exercise for person with BMI >25 Will recheck weight in 3-6 months    Labs pending Health Maintenance reviewed Diet and exercise encouraged  Follow up plan: 3 months   Mary-Margaret Hassell Done, FNP

## 2021-08-25 ENCOUNTER — Other Ambulatory Visit: Payer: Self-pay

## 2021-08-25 ENCOUNTER — Ambulatory Visit: Payer: Medicare Other | Admitting: *Deleted

## 2021-08-25 DIAGNOSIS — E1142 Type 2 diabetes mellitus with diabetic polyneuropathy: Secondary | ICD-10-CM

## 2021-08-25 DIAGNOSIS — I10 Essential (primary) hypertension: Secondary | ICD-10-CM

## 2021-08-25 LAB — CMP14+EGFR
ALT: 14 IU/L (ref 0–32)
AST: 9 IU/L (ref 0–40)
Albumin/Globulin Ratio: 1.2 (ref 1.2–2.2)
Albumin: 4 g/dL (ref 3.7–4.7)
Alkaline Phosphatase: 94 IU/L (ref 44–121)
BUN/Creatinine Ratio: 19 (ref 12–28)
BUN: 14 mg/dL (ref 8–27)
Bilirubin Total: <0.2 mg/dL (ref 0.0–1.2)
CO2: 21 mmol/L (ref 20–29)
Calcium: 8.9 mg/dL (ref 8.7–10.3)
Chloride: 105 mmol/L (ref 96–106)
Creatinine, Ser: 0.72 mg/dL (ref 0.57–1.00)
Globulin, Total: 3.3 g/dL (ref 1.5–4.5)
Glucose: 261 mg/dL — ABNORMAL HIGH (ref 65–99)
Potassium: 4.3 mmol/L (ref 3.5–5.2)
Sodium: 141 mmol/L (ref 134–144)
Total Protein: 7.3 g/dL (ref 6.0–8.5)
eGFR: 86 mL/min/{1.73_m2} (ref >59)

## 2021-08-25 LAB — LIPID PANEL
Chol/HDL Ratio: 4.2 ratio (ref 0.0–4.4)
Cholesterol, Total: 168 mg/dL (ref 100–199)
HDL: 40 mg/dL (ref >39)
LDL Chol Calc (NIH): 93 mg/dL (ref 0–99)
Triglycerides: 207 mg/dL — ABNORMAL HIGH (ref 0–149)
VLDL Cholesterol Cal: 35 mg/dL (ref 5–40)

## 2021-08-25 LAB — CBC WITH DIFFERENTIAL/PLATELET
Basophils Absolute: 0.1 10*3/uL (ref 0.0–0.2)
Basos: 1 %
EOS (ABSOLUTE): 0.4 10*3/uL (ref 0.0–0.4)
Eos: 4 %
Hematocrit: 38.9 % (ref 34.0–46.6)
Hemoglobin: 11.9 g/dL (ref 11.1–15.9)
Immature Grans (Abs): 0 10*3/uL (ref 0.0–0.1)
Immature Granulocytes: 0 %
Lymphocytes Absolute: 2.4 10*3/uL (ref 0.7–3.1)
Lymphs: 27 %
MCH: 24.3 pg — ABNORMAL LOW (ref 26.6–33.0)
MCHC: 30.6 g/dL — ABNORMAL LOW (ref 31.5–35.7)
MCV: 80 fL (ref 79–97)
Monocytes Absolute: 0.6 10*3/uL (ref 0.1–0.9)
Monocytes: 7 %
Neutrophils Absolute: 5.7 10*3/uL (ref 1.4–7.0)
Neutrophils: 61 %
Platelets: 385 10*3/uL (ref 150–450)
RBC: 4.89 x10E6/uL (ref 3.77–5.28)
RDW: 14.9 % (ref 11.7–15.4)
WBC: 9.2 10*3/uL (ref 3.4–10.8)

## 2021-08-28 ENCOUNTER — Telehealth: Payer: Medicare Other | Admitting: *Deleted

## 2021-08-30 DIAGNOSIS — I10 Essential (primary) hypertension: Secondary | ICD-10-CM

## 2021-08-30 DIAGNOSIS — E1142 Type 2 diabetes mellitus with diabetic polyneuropathy: Secondary | ICD-10-CM | POA: Diagnosis not present

## 2021-08-30 DIAGNOSIS — E785 Hyperlipidemia, unspecified: Secondary | ICD-10-CM

## 2021-09-06 ENCOUNTER — Telehealth: Payer: Self-pay

## 2021-09-06 NOTE — Chronic Care Management (AMB) (Signed)
  Chronic Care Management   Note  09/06/2021 Name: Katelyn Ballard MRN: [id number] DOB: March 17, 1945  Katelyn Ballard is a 76 y.o. year old female who is a primary care patient of Bennie Pierini, FNP. Katelyn Ballard is currently enrolled in care management services. An additional referral for Pharm D  was placed.   Follow up plan: Unsuccessful telephone outreach attempt made. A HIPAA compliant phone message was left for the patient providing contact information and requesting a return call.  The care management team will reach out to the patient again over the next 7 days.  If patient returns call to provider office, please advise to call Embedded Care Management Care Guide Penne Lash at [phone]  Penne Lash, RMA Care Guide, Embedded Care Coordination [facility]  [place], [place] [address] Direct Dial: [phone] [email or web address] [email or web address]

## 2021-09-07 NOTE — Chronic Care Management (AMB) (Signed)
Chronic Care Management   CCM RN Visit Note  08/25/2021 Name: Katelyn Ballard MRN: [id number] DOB: Jul 12, 1945  Subjective: Katelyn Ballard is a 76 y.o. year old female who is a primary care patient of Chevis Pretty, [place]. The care management team was consulted for assistance with disease management and care coordination needs.    Engaged with patient by telephone for follow up visit in response to provider referral for case management and/or care coordination services.   Consent to Services:  The patient was given information about Chronic Care Management services, agreed to services, and gave verbal consent prior to initiation of services.  Please see initial visit note for detailed documentation.   Patient agreed to services and verbal consent obtained.   Assessment: Review of patient past medical history, allergies, medications, health status, including review of consultants reports, laboratory and other test data, was performed as part of comprehensive evaluation and provision of chronic care management services.   SDOH (Social Determinants of Health) assessments and interventions performed:    CCM Care Plan  Allergies  Allergen Reactions   Atorvastatin Other (See Comments)    myalgias   Penicillins     Outpatient Encounter Medications as of 08/25/2021  Medication Sig   allopurinol (ZYLOPRIM) 300 MG tablet Take 1 tablet (300 mg total) by mouth daily.   amLODipine (NORVASC) 10 MG tablet Take 1 tablet (10 mg total) by mouth daily.   blood glucose meter kit and supplies Check blood sugar bid and as needed Dx: E11.42   blood glucose meter kit and supplies Dispense based on patient and insurance preference. Use up to four times daily as directed. (FOR ICD-10 E10.9, E11.9).   fenofibrate 160 MG tablet Take 1 tablet (160 mg total) by mouth daily. (Needs to be seen before next refill)   glimepiride (AMARYL) 4 MG tablet Take 2 tablets (8 mg total) by mouth daily before  breakfast.   glucose blood (ONETOUCH ULTRA) test strip USE 1 STRIP TO CHECK GLUCOSE 2 TO 3 TIMES DAILY   lisinopril (ZESTRIL) 40 MG tablet Take 1 tablet (40 mg total) by mouth daily. (Needs to be seen before next refill)   metFORMIN (GLUCOPHAGE) 1000 MG tablet Take 1 tablet (1,000 mg total) by mouth 2 (two) times daily with a meal.   rosuvastatin (CRESTOR) 10 MG tablet Take 1 tablet (10 mg total) by mouth daily.   No facility-administered encounter medications on file as of 08/25/2021.    Patient Active Problem List   Diagnosis Date Noted   Trigger finger, left middle finger 02/26/2019   BMI 33.0-33.9,adult 10/04/2015   Vitamin D deficiency 04/08/2015   Diabetes ([place]) 05/03/2014   Hypertension 10/22/2013   Hyperlipidemia with target LDL less than 100 10/22/2013   Gout 10/22/2013    Conditions to be addressed/monitored:HTN and DMII  Care Plan : [place]  Updates made by Ilean China, RN since 09/07/2021 12:00 AM     Problem: Chronic Disease Management Needs   Priority: Medium     Long-Range Goal: Work with [facility] Regarding Care Management and Care Coordination Associated with Hypertension and Diabetes   Start Date: 08/25/2021  This Visit's Progress: On track  Priority: Medium  Note:   Current Barriers:  Chronic Disease Management support and education needs related to HTN and DMII  RNCM Clinical Goal(s):  Patient will continue to work with RN Care Manager to address care management and care coordination needs related to HTN and DMII  through collaboration with RN  Care manager, provider, and care team.   Interventions: 1:1 collaboration with primary care provider regarding development and update of comprehensive plan of care as evidenced by provider attestation and co-signature Inter-disciplinary care team collaboration (see longitudinal plan of care) Evaluation of current treatment plan related to  self management and patient's adherence to plan as established by  provider   Diabetes:  (Status: New goal.) Lab Results  Component Value Date   HGBA1C 9.3 (H) 08/24/2021  Assessed patient's understanding of A1c goal: <7% Provided education to patient about basic DM disease process; Reviewed medications with patient and discussed importance of medication adherence;        Reviewed prescribed diet with patient ADA/Carb modified diet; Counseled on importance of regular laboratory monitoring as prescribed;        Discussed plans with patient for ongoing care management follow up and provided patient with direct contact information for care management team;      Advised patient, providing education and rationale, to check cbg with meals and at bedtime and record        Review of patient status, including review of consultants reports, relevant laboratory and other test results, and medications completed;       Screening for signs and symptoms of depression related to chronic disease state;        Assessed social determinant of health barriers;        Reviewed upcoming appointments  Hypertension: (Status: Goal on track: NO.) Last practice recorded BP readings:  BP Readings from Last 3 Encounters:  08/24/21 (!) 173/91  04/18/21 (!) 159/94  09/08/19 (!) 166/86  Most recent eGFR/CrCl:  Lab Results  Component Value Date   EGFR 86 08/24/2021    No components found for: CRCL  Evaluation of current treatment plan related to hypertension self management and patient's adherence to plan as established by provider;   Provided education to patient re: stroke prevention, s/s of heart attack and stroke; Reviewed prescribed diet DASH/low sodium Reviewed medications with patient and discussed importance of compliance;  Provided assistance with obtaining home blood pressure monitor via [place];  Counseled on the importance of exercise goals with target of 150 minutes per week Discussed plans with patient for ongoing care management follow up and provided patient  with direct contact information for care management team; Reviewed scheduled/upcoming provider appointments including:  Discussed complications of poorly controlled blood pressure such as heart disease, stroke, circulatory complications, vision complications, kidney impairment, sexual dysfunction;  Screening for signs and symptoms of depression related to chronic disease state;   Patient Goals/Self-Care Activities: Patient will self administer medications as prescribed Patient will continue to perform ADL's independently Patient will continue to perform IADL's independently Patient will call provider office for new concerns or questions       Plan:Telephone follow up appointment with care management team member scheduled for:  09/12/21 with RNCM and The patient has been provided with contact information for the care management team and has been advised to call with any health related questions or concerns.   Chong Sicilian, BSN, RN-BC Embedded Chronic Care Manager [facility] [place] Management Direct Dial: [phone]

## 2021-09-07 NOTE — Patient Instructions (Signed)
Visit Information  PATIENT GOALS:  Goals Addressed             This Visit's Progress    RNCM: Monitor and Manage My Blood Sugar-Diabetes Type 2   On track    Timeframe:  Long-Range Goal Priority:  Medium Start Date:   08/25/21                          Expected End Date: 08/25/22                      Follow Up Date 09/12/21   Check blood sugar at prescribed times check blood sugar before and after exercise check blood sugar if I feel it is too high or too low enter blood sugar readings and medication or insulin into daily log take the blood sugar log to all doctor visits take the blood sugar meter to all doctor visits Follow an ADA/Carb modified diet    Why is this important?   Checking your blood sugar at home helps to keep it from getting very high or very low.  Writing the results in a diary or log helps the doctor know how to care for you.  Your blood sugar log should have the time, date and the results.  Also, write down the amount of insulin or other medicine that you take.  Other information, like what you ate, exercise done and how you were feeling, will also be helpful.     Notes:      Track and Manage My Blood Pressure-Hypertension   Not on track    Timeframe:  Long-Range Goal Priority:  Medium Start Date:  08/25/21                           Expected End Date:                       Follow Up Date 09/12/21    Obtain a blood pressure monitor Check and record blood pressure daily Call PCP with any readings outside of recommended range Follow a low sodium/DASH diet Call [facility] as needed [phone] Keep all medical appts Take all medications as prescribed   Why is this important?   You won't feel high blood pressure, but it can still hurt your blood vessels.  High blood pressure can cause heart or kidney problems. It can also cause a stroke.  Making lifestyle changes like losing a little weight or eating less salt will help.  Checking your blood pressure at home  and at different times of the day can help to control blood pressure.  If the doctor prescribes medicine remember to take it the way the doctor ordered.  Call the office if you cannot afford the medicine or if there are questions about it.     Notes:         The patient verbalized understanding of instructions, educational materials, and care plan provided today and declined offer to receive copy of patient instructions, educational materials, and care plan.    Plan:Telephone follow up appointment with care management team member scheduled for:  09/12/21 with RNCM and The patient has been provided with contact information for the care management team and has been advised to call with any health related questions or concerns.   Demetrios Loll, BSN, RN-BC Embedded Chronic Care Manager [facility] Care Management  Direct Dial: [phone]

## 2021-09-12 ENCOUNTER — Ambulatory Visit: Payer: Medicare Other | Admitting: *Deleted

## 2021-09-12 DIAGNOSIS — I10 Essential (primary) hypertension: Secondary | ICD-10-CM

## 2021-09-12 DIAGNOSIS — E1142 Type 2 diabetes mellitus with diabetic polyneuropathy: Secondary | ICD-10-CM

## 2021-09-12 DIAGNOSIS — F439 Reaction to severe stress, unspecified: Secondary | ICD-10-CM

## 2021-09-12 NOTE — Chronic Care Management (AMB) (Signed)
Chronic Care Management   CCM RN Visit Note  09/12/2021 Name: Katelyn Ballard MRN: [id number] DOB: Apr 19, 1945  Subjective: Katelyn Ballard is a 76 y.o. year old female who is a primary care patient of Chevis Pretty, [place]. The care management team was consulted for assistance with disease management and care coordination needs.    Engaged with patient by telephone for follow up visit in response to provider referral for case management and/or care coordination services.   Consent to Services:  The patient was given information about Chronic Care Management services, agreed to services, and gave verbal consent prior to initiation of services.  Please see initial visit note for detailed documentation.   Patient agreed to services and verbal consent obtained.   Assessment: Review of patient past medical history, allergies, medications, health status, including review of consultants reports, laboratory and other test data, was performed as part of comprehensive evaluation and provision of chronic care management services.   SDOH (Social Determinants of Health) assessments and interventions performed:    CCM Care Plan  Allergies  Allergen Reactions   Atorvastatin Other (See Comments)    myalgias   Penicillins     Outpatient Encounter Medications as of 09/12/2021  Medication Sig   allopurinol (ZYLOPRIM) 300 MG tablet Take 1 tablet (300 mg total) by mouth daily.   amLODipine (NORVASC) 10 MG tablet Take 1 tablet (10 mg total) by mouth daily.   blood glucose meter kit and supplies Check blood sugar bid and as needed Dx: E11.42   blood glucose meter kit and supplies Dispense based on patient and insurance preference. Use up to four times daily as directed. (FOR ICD-10 E10.9, E11.9).   fenofibrate 160 MG tablet Take 1 tablet (160 mg total) by mouth daily. (Needs to be seen before next refill)   glimepiride (AMARYL) 4 MG tablet Take 2 tablets (8 mg total) by mouth daily before  breakfast.   glucose blood (ONETOUCH ULTRA) test strip USE 1 STRIP TO CHECK GLUCOSE 2 TO 3 TIMES DAILY   lisinopril (ZESTRIL) 40 MG tablet Take 1 tablet (40 mg total) by mouth daily. (Needs to be seen before next refill)   metFORMIN (GLUCOPHAGE) 1000 MG tablet Take 1 tablet (1,000 mg total) by mouth 2 (two) times daily with a meal.   rosuvastatin (CRESTOR) 10 MG tablet Take 1 tablet (10 mg total) by mouth daily.   No facility-administered encounter medications on file as of 09/12/2021.    Patient Active Problem List   Diagnosis Date Noted   Trigger finger, left middle finger 02/26/2019   BMI 33.0-33.9,adult 10/04/2015   Vitamin D deficiency 04/08/2015   Diabetes ([place]) 05/03/2014   Hypertension 10/22/2013   Hyperlipidemia with target LDL less than 100 10/22/2013   Gout 10/22/2013    Conditions to be addressed/monitored:HTN, DMII, and Depression  Care Plan : [place]  Updates made by Ilean China, RN since 09/12/2021 12:00 AM     Problem: Chronic Disease Management Needs   Priority: Medium     Long-Range Goal: Work with [place] with Hypertension and Diabetes   Start Date: 08/25/2021  Recent Progress: On track  Priority: Medium  Note:   Current Barriers:  Chronic Disease Management support and education needs related to HTN and DMII  RNCM Clinical Goal(s):  Patient will continue to work with RN Care Manager to address care management and care coordination needs related to HTN and DMII  through collaboration with RN  Care manager, provider, and care team.   Interventions: 1:1 collaboration with primary care provider regarding development and update of comprehensive plan of care as evidenced by provider attestation and co-signature Inter-disciplinary care team collaboration (see longitudinal plan of care) Evaluation of current treatment plan related to  self management and patient's adherence to plan as established by  provider Called to follow-up with patient, but patient was tearful and said that she didn't feel like talking today.  Reminded that she has a follow-up call with LCSW next week and encouraged to reach out sooner if needed. Collaborated with LCSW   Diabetes:  (Status:  Not addressed. Patient did not want to talk about it ) Lab Results  Component Value Date   HGBA1C 9.3 (H) 08/24/2021  Assessed patient's understanding of A1c goal: <7% Provided education to patient about basic DM disease process; Reviewed medications with patient and discussed importance of medication adherence;        Reviewed prescribed diet with patient ADA/Carb modified diet; Counseled on importance of regular laboratory monitoring as prescribed;        Discussed plans with patient for ongoing care management follow up and provided patient with direct contact information for care management team;      Advised patient, providing education and rationale, to check cbg with meals and at bedtime and record        Review of patient status, including review of consultants reports, relevant laboratory and other test results, and medications completed;       Screening for signs and symptoms of depression related to chronic disease state;        Assessed social determinant of health barriers;        Reviewed upcoming appointments  Hypertension: (Status:  Not addressed. Patient did not want to talk about it ) Last practice recorded BP readings:  BP Readings from Last 3 Encounters:  08/24/21 (!) 173/91  04/18/21 (!) 159/94  09/08/19 (!) 166/86  Most recent eGFR/CrCl:  Lab Results  Component Value Date   EGFR 86 08/24/2021    No components found for: CRCL  Evaluation of current treatment plan related to hypertension self management and patient's adherence to plan as established by provider;   Provided education to patient re: stroke prevention, s/s of heart attack and stroke; Reviewed prescribed diet DASH/low sodium Reviewed  medications with patient and discussed importance of compliance;  Provided assistance with obtaining home blood pressure monitor via [place];  Counseled on the importance of exercise goals with target of 150 minutes per week Discussed plans with patient for ongoing care management follow up and provided patient with direct contact information for care management team; Reviewed scheduled/upcoming provider appointments including:  Discussed complications of poorly controlled blood pressure such as heart disease, stroke, circulatory complications, vision complications, kidney impairment, sexual dysfunction;  Screening for signs and symptoms of depression related to chronic disease state;   Patient Goals/Self-Care Activities: Patient will self administer medications as prescribed Patient will continue to perform ADL's independently Patient will continue to perform IADL's independently Patient will call provider office for new concerns or questions       Plan:Telephone follow up appointment with care management team member scheduled for:  09/27/21 with LCSW and The patient has been provided with contact information for the care management team and has been advised to call with any health related questions or concerns.   Chong Sicilian, BSN, RN-BC Embedded Chronic Care Manager [facility] / [facility] Care Management Direct Dial:  [phone]

## 2021-09-12 NOTE — Patient Instructions (Signed)
Visit Information  PATIENT GOALS:  Goals Addressed             This Visit's Progress    Manage My Emotions   Not on track    Timeframe:  Long-Range Goal Priority:  Medium Start Date:                             Expected End Date:                       Follow Up Date 09/27/21    Talk with LCSW on 09/27/21 Call CCM team as needed Seek appropriate medical care as needed   Why is this important?   When you are stressed, down or upset, your body reacts too.  For example, your blood pressure may get higher; you may have a headache or stomachache.  When your emotions get the best of you, your body's ability to fight off cold and flu gets weak.  These steps will help you manage your emotions.     Notes:         The patient verbalized understanding of instructions, educational materials, and care plan provided today and declined offer to receive copy of patient instructions, educational materials, and care plan.   Plan:Telephone follow up appointment with care management team member scheduled for:  09/27/21 with LCSW and The patient has been provided with contact information for the care management team and has been advised to call with any health related questions or concerns.   Demetrios Loll, BSN, RN-BC Embedded Chronic Care Manager [facility] / [facility] Care Management Direct Dial: [phone]

## 2021-09-15 NOTE — Chronic Care Management (AMB) (Signed)
  Chronic Care Management   Note  09/15/2021 Name: ANN-MARIE KLUGE MRN: [id number] DOB: 02-13-1945  LATINA FRANK is a 76 y.o. year old female who is a primary care patient of Bennie Pierini, FNP. ALAYNNA KERWOOD is currently enrolled in care management services. An additional referral for Pharm D  was placed.   Follow up plan: Unsuccessful telephone outreach attempt made.  The care management team will reach out to the patient again over the next 7 days.  If patient returns call to provider office, please advise to call Embedded Care Management Care Guide Penne Lash  at [phone]  Penne Lash, RMA Care Guide, Embedded Care Coordination [facility]  [place], [place] [address] Direct Dial: [phone] [email or web address] Website: [email or web address]

## 2021-09-21 NOTE — Chronic Care Management (AMB) (Signed)
  Chronic Care Management   Note  09/21/2021 Name: Katelyn Ballard MRN: [id number] DOB: [birth year]  Katelyn Ballard is a 76 y.o. year old female who is a primary care patient of Bennie Pierini, FNP. Katelyn Ballard is currently enrolled in care management services. An additional referral for pharm D  was placed.   Follow up plan: Unable to make contact on outreach attempts x 3. PCP Bennie Pierini, FNP notified via routed documentation in medical record.   Penne Lash, RMA Care Guide, Embedded Care Coordination [facility]  [place], [place] [address] Direct Dial: [phone] [email or web address] Website: [email or web address]

## 2021-09-27 ENCOUNTER — Ambulatory Visit (INDEPENDENT_AMBULATORY_CARE_PROVIDER_SITE_OTHER): Payer: Medicare Other | Admitting: Licensed Clinical Social Worker

## 2021-09-27 ENCOUNTER — Telehealth: Payer: Self-pay

## 2021-09-27 DIAGNOSIS — R2681 Unsteadiness on feet: Secondary | ICD-10-CM

## 2021-09-27 DIAGNOSIS — E559 Vitamin D deficiency, unspecified: Secondary | ICD-10-CM

## 2021-09-27 DIAGNOSIS — M1A9XX0 Chronic gout, unspecified, without tophus (tophi): Secondary | ICD-10-CM

## 2021-09-27 DIAGNOSIS — I10 Essential (primary) hypertension: Secondary | ICD-10-CM

## 2021-09-27 DIAGNOSIS — E1142 Type 2 diabetes mellitus with diabetic polyneuropathy: Secondary | ICD-10-CM

## 2021-09-27 DIAGNOSIS — E785 Hyperlipidemia, unspecified: Secondary | ICD-10-CM

## 2021-09-27 NOTE — Chronic Care Management (AMB) (Signed)
Chronic Care Management    Clinical Social Work Note  09/27/2021 Name: Katelyn Ballard MRN: [id number] DOB: 1945-06-28  Katelyn Ballard is a 76 y.o. year old female who is a primary care patient of Chevis Pretty, [place]. The CCM team was consulted to assist the patient with chronic disease management and/or care coordination needs related to: [employer] .   Engaged with patient by telephone for follow up visit in response to provider referral for social work chronic care management and care coordination services.   Consent to Services:  The patient was given information about Chronic Care Management services, agreed to services, and gave verbal consent prior to initiation of services.  Please see initial visit note for detailed documentation.   Patient agreed to services and consent obtained.   Assessment: Review of patient past medical history, allergies, medications, and health status, including review of relevant consultants reports was performed today as part of a comprehensive evaluation and provision of chronic care management and care coordination services.     SDOH (Social Determinants of Health) assessments and interventions performed:  SDOH Interventions    Flowsheet Row Most Recent Value  SDOH Interventions   Stress Interventions Provide Counseling  [client has stress related to managing her medical needs. client has stress related to managing her financial needs]  Depression Interventions/Treatment  --  [informed client of LCSW support and of RNCM support]        Advanced Directives Status: See [employer] application for related entries.  CCM Care Plan  Allergies  Allergen Reactions   Atorvastatin Other (See Comments)    myalgias   Penicillins     Outpatient Encounter Medications as of 09/27/2021  Medication Sig   allopurinol (ZYLOPRIM) 300 MG tablet Take 1 tablet (300 mg total) by mouth daily.   amLODipine (NORVASC) 10 MG tablet Take 1 tablet (10 mg  total) by mouth daily.   blood glucose meter kit and supplies Check blood sugar bid and as needed Dx: E11.42   blood glucose meter kit and supplies Dispense based on patient and insurance preference. Use up to four times daily as directed. (FOR ICD-10 E10.9, E11.9).   fenofibrate 160 MG tablet Take 1 tablet (160 mg total) by mouth daily. (Needs to be seen before next refill)   glimepiride (AMARYL) 4 MG tablet Take 2 tablets (8 mg total) by mouth daily before breakfast.   glucose blood (ONETOUCH ULTRA) test strip USE 1 STRIP TO CHECK GLUCOSE 2 TO 3 TIMES DAILY   lisinopril (ZESTRIL) 40 MG tablet Take 1 tablet (40 mg total) by mouth daily. (Needs to be seen before next refill)   metFORMIN (GLUCOPHAGE) 1000 MG tablet Take 1 tablet (1,000 mg total) by mouth 2 (two) times daily with a meal.   rosuvastatin (CRESTOR) 10 MG tablet Take 1 tablet (10 mg total) by mouth daily.   No facility-administered encounter medications on file as of 09/27/2021.    Patient Active Problem List   Diagnosis Date Noted   Trigger finger, left middle finger 02/26/2019   BMI 33.0-33.9,adult 10/04/2015   Vitamin D deficiency 04/08/2015   Diabetes ([place]) 05/03/2014   Hypertension 10/22/2013   Hyperlipidemia with target LDL less than 100 10/22/2013   Gout 10/22/2013    Conditions to be addressed/monitored: monitor housing needs of client; monitor financial needs of client; monitor client management of anxiety issues  Care Plan : LCSW care plan  Updates made by Katha Cabal, LCSW since 09/27/2021 12:00 AM  Problem: Coping Skills (General Plan of Care)      Goal: Coping Skills Enhanced: Manage housing needs;Manage financial needs   Start Date: 08/17/2021  Expected End Date: 12/14/2021  This Visit's Progress: On track  Recent Progress: On track  Priority: Medium  Note:   Current barriers:   Housing challenges Mobility issues Breathing challenges Financial challenges  Clinical Goals:  LCSW to call  client in next 30 days to discuss housing needs of client Client to call LCSW or RNCM as needed for CCM support in next 30 days Client to attend all scheduled medical appointments in next 30 days  Clinical Interventions:  Collaboration with Chevis Pretty, FNP regarding development and update of comprehensive plan of care as evidenced by provider attestation and co-signature Talked with client about needs of client Talked with client about mobility of client (she uses a walker to help her walk) Talked with client about decreased energy of client Talked with client about housing needs (she lives at home of her son.She is having repairs done to her mobile home and hopes to eventually move back to her own mobile home when repairs are complete) Talked with client about medication procurement Talked with client about sleeping issues of client .  Talked with client about relaxation techniques (used to love to sew, enjoys watching birds at birdfeeder, cares for pet dog) Provided counseling support for client Talked with client about her use of oxygen as needed in the home. Talked with client about upcoming medical appointments Encouraged client to call RNCM as needed for CCM support Talked with client about pain of client .  Talked with Olin Hauser about CCM program support Talked with client about medication procurement.  Client said she would like to get a 4 pronged cane to assist her in walking. She also said she would like to get a standard walker.  LCSW talked today with Triage Nurse, Dewain Penning, LPN, about these client requests. Jan said she would communicate with Chevis Pretty FNP regarding client requests for these assistive devices Collaborated with RNCM  Chong Sicilian today about current needs of client  Patient Coping Skills Completes ADLs as needed Takes medications as scheduled Attends scheduled medical appointments Drives to appointments  Patient Deficits: Housing  challenges Mobility challenges  Patient Goals:   Call LCSW or RNCM in next 30 days as needed for support Attend scheduled medical appointments in next 30 days Take prescribed medications as ordered -  Follow Up Plan: LCSW to call client on 11/13/21 at 2:00 PM to assess client needs      Norva Riffle.Charlcie Prisco MSW, LCSW Licensed Clinical Social Worker [facility] Care Management [phone]

## 2021-09-27 NOTE — Patient Instructions (Signed)
Visit Information  PATIENT GOALS:  Goals Addressed             This Visit's Progress    Protect My Health;Manage housing needs; manage anxiety issues; manage financial issues       Timeframe:  Short-Term Goal Priority:  Medium Progress; On Track Start Date:             08/17/21                Expected End Date:          12/14/21            Follow Up Date 11/13/21 at 2:00 PM   Protect My Health (Patient)    Manage anxiety issues; manage housing issues  ; manage financial issues   Why is this important?   Screening tests can find diseases early when they are easier to treat.  Your doctor or nurse will talk with you about which tests are important for you.  Getting shots for common diseases like the flu and shingles will help prevent them.     Patient Coping Skills Completes ADLs as needed Takes medications as scheduled Attends scheduled medical appointments Drives to appointments  Patient Deficits: Housing challenges Mobility challenges  Patient Goals:   Call LCSW or RNCM in next 30 days as needed for support Attend scheduled medical appointments in next 30 days Take prescribed medications as ordered -  Follow Up Plan: LCSW to call client on 11/13/21 at 2:00 PM to assess client needs     Kelton Pillar.Lejla Moeser MSW, LCSW Licensed Clinical Social Worker [facility] Care Management [phone]

## 2021-09-27 NOTE — Telephone Encounter (Signed)
Per Lorin Picket, CSW, patient would like you to order a four pronged cane and also a normal rolling walker for her.  Looks like the walker was ordered on 08/24/21 but patient said she never received.

## 2021-09-28 NOTE — Telephone Encounter (Signed)
Please order

## 2021-09-28 NOTE — Telephone Encounter (Signed)
Orders placed and printed. Attempted to contact patient my phone and she does not accept calls from our number. Mailed both orders to patient

## 2021-09-29 DIAGNOSIS — E785 Hyperlipidemia, unspecified: Secondary | ICD-10-CM

## 2021-09-29 DIAGNOSIS — I10 Essential (primary) hypertension: Secondary | ICD-10-CM

## 2021-09-29 DIAGNOSIS — E1142 Type 2 diabetes mellitus with diabetic polyneuropathy: Secondary | ICD-10-CM | POA: Diagnosis not present

## 2021-11-13 ENCOUNTER — Ambulatory Visit (INDEPENDENT_AMBULATORY_CARE_PROVIDER_SITE_OTHER): Payer: Medicare Other | Admitting: Licensed Clinical Social Worker

## 2021-11-13 DIAGNOSIS — E559 Vitamin D deficiency, unspecified: Secondary | ICD-10-CM

## 2021-11-13 DIAGNOSIS — E785 Hyperlipidemia, unspecified: Secondary | ICD-10-CM

## 2021-11-13 DIAGNOSIS — M1A9XX0 Chronic gout, unspecified, without tophus (tophi): Secondary | ICD-10-CM

## 2021-11-13 DIAGNOSIS — I10 Essential (primary) hypertension: Secondary | ICD-10-CM

## 2021-11-13 DIAGNOSIS — E1142 Type 2 diabetes mellitus with diabetic polyneuropathy: Secondary | ICD-10-CM

## 2021-11-13 NOTE — Patient Instructions (Addendum)
Visit Information  Patient Goals:  Protect My Health (Patient). Manage anxiety issues. Manage housing Issues. Manage financial issues  Timeframe:  Short-Term Goal Priority:  Medium Progress; On Track Start Date:            11/13/21                Expected End Date:          02/08/22            Follow Up Date 01/11/22 at 2:00 PM   Protect My Health (Patient)    Manage anxiety issues; manage housing issues  ; manage financial issues   Why is this important?   Screening tests can find diseases early when they are easier to treat.  Your doctor or nurse will talk with you about which tests are important for you.  Getting shots for common diseases like the flu and shingles will help prevent them.     Patient Coping Skills Completes ADLs as needed Takes medications as scheduled Attends scheduled medical appointments Drives to appointments  Patient Deficits: Housing challenges Mobility challenges  Patient Goals:   Call LCSW or RNCM in next 30 days as needed for support Attend scheduled medical appointments in next 30 days Take prescribed medications as ordered -  Follow Up Plan: LCSW to call client on 01/11/22 at 2:00 PM to assess client needs   Kelton Pillar.Callen Vancuren MSW, LCSW Licensed Clinical Social Worker [facility] Care Management [phone]

## 2021-11-13 NOTE — Chronic Care Management (AMB) (Signed)
Chronic Care Management    Clinical Social Work Note  11/13/2021 Name: Katelyn Ballard MRN: [id number] DOB: 06/01/1945  Katelyn Ballard is a 76 y.o. year old female who is a primary care patient of Chevis Pretty, [place]. The CCM team was consulted to assist the patient with chronic disease management and/or care coordination needs related to: [employer] .   Engaged with patient by telephone for follow up visit in response to provider referral for social work chronic care management and care coordination services.   Consent to Services:  The patient was given information about Chronic Care Management services, agreed to services, and gave verbal consent prior to initiation of services.  Please see initial visit note for detailed documentation.   Patient agreed to services and consent obtained.   Assessment: Review of patient past medical history, allergies, medications, and health status, including review of relevant consultants reports was performed today as part of a comprehensive evaluation and provision of chronic care management and care coordination services.     SDOH (Social Determinants of Health) assessments and interventions performed:  SDOH Interventions    Flowsheet Row Most Recent Value  SDOH Interventions   Physical Activity Interventions Other (Comments)  [client has walking challenges. She uses a walker to help her walk]  Stress Interventions Provide Counseling  [client has stress related to managing Diabetes and managing Finances. she wants to move back to her mobile home, which has been repaired. But rodent problem is still present and she cannot move back to mobile home until rodent problem is addressed]  Depression Interventions/Treatment  --  [informed client of LCSW support and of RNCM support]        Advanced Directives Status: See Vynca application for related entries.  CCM Care Plan  Allergies  Allergen Reactions   Atorvastatin Other (See  Comments)    myalgias   Penicillins     Outpatient Encounter Medications as of 11/13/2021  Medication Sig   allopurinol (ZYLOPRIM) 300 MG tablet Take 1 tablet (300 mg total) by mouth daily.   amLODipine (NORVASC) 10 MG tablet Take 1 tablet (10 mg total) by mouth daily.   blood glucose meter kit and supplies Check blood sugar bid and as needed Dx: E11.42   blood glucose meter kit and supplies Dispense based on patient and insurance preference. Use up to four times daily as directed. (FOR ICD-10 E10.9, E11.9).   fenofibrate 160 MG tablet Take 1 tablet (160 mg total) by mouth daily. (Needs to be seen before next refill)   glimepiride (AMARYL) 4 MG tablet Take 2 tablets (8 mg total) by mouth daily before breakfast.   glucose blood (ONETOUCH ULTRA) test strip USE 1 STRIP TO CHECK GLUCOSE 2 TO 3 TIMES DAILY   lisinopril (ZESTRIL) 40 MG tablet Take 1 tablet (40 mg total) by mouth daily. (Needs to be seen before next refill)   metFORMIN (GLUCOPHAGE) 1000 MG tablet Take 1 tablet (1,000 mg total) by mouth 2 (two) times daily with a meal.   rosuvastatin (CRESTOR) 10 MG tablet Take 1 tablet (10 mg total) by mouth daily.   No facility-administered encounter medications on file as of 11/13/2021.    Patient Active Problem List   Diagnosis Date Noted   Trigger finger, left middle finger 02/26/2019   BMI 33.0-33.9,adult 10/04/2015   Vitamin D deficiency 04/08/2015   Diabetes ([place]) 05/03/2014   Hypertension 10/22/2013   Hyperlipidemia with target LDL less than 100 10/22/2013   Gout 10/22/2013  Conditions to be addressed/monitored: monitor mobility needs of client. Monitor housing needs of client. Monitor financial needs of client  Care Plan : LCSW care plan  Updates made by Katha Cabal, LCSW since 11/13/2021 12:00 AM     Problem: Coping Skills (General Plan of Care)      Goal: Coping Skills Enhanced: Manage housing needs;Manage financial needs   Start Date: 11/13/2021  Expected End  Date: 02/08/2022  This Visit's Progress: On track  Recent Progress: On track  Priority: Medium  Note:   Current barriers:   Housing challenges Mobility issues Breathing challenges Financial challenges  Clinical Goals:  LCSW to call client in next 30 days to discuss housing needs of client Client to call LCSW or RNCM as needed for CCM support in next 30 days Client to attend all scheduled medical appointments in next 30 days  Clinical Interventions:  Collaboration with Chevis Pretty, FNP regarding development and update of comprehensive plan of care as evidenced by provider attestation and co-signature Discussed needs of client and mobility of client. Client does use a walker to help her walk  Reviewed energy level of client. She said she often has fatigue or reduced energy  Reviewed housing needs of client (she lives at home of her son.She is having repairs done to her mobile home and hopes to eventually move back to her own mobile home when repairs are complete) Discussed medication procurement of client and discussed sleeping issues of client.  Reviewed relaxation techniques with client. She likes to listen to music, she likes to visit with family members, she likes to talk via phone with family and friends Provided counseling support for client Client stated she is no longer using oxygen in the home. She said she did have a nebulizer which she is using as prescribed.  Encouraged client to call RNCM as needed for CCM  nursing support Reviewed pain of client. She did not mention any current pain issues LCSW informed client that order had been sent in a few weeks ago for her to get a walker to help her ambulate. She said that order was sent to [facility] in [place] but she said she has not gone to [facility] to pick up this walker. Discussed mood of client. She said she gets a little anxious at times because she wants to return to her mobile home But due to rodent problem she is not yet  able to return to mobile home. She said she enjoys her pet dog and this dog helps her to relax. She is taking medications as prescribed.  Patient Coping Skills Completes ADLs as needed Takes medications as scheduled Attends scheduled medical appointments Drives to appointments  Patient Deficits: Housing challenges Mobility challenges  Patient Goals:   Call LCSW or RNCM in next 30 days as needed for support Attend scheduled medical appointments in next 30 days Take prescribed medications as ordered -  Follow Up Plan: LCSW to call client on 01/11/22 at 2:00 PM to assess client needs      Norva Riffle.Ekam Bonebrake MSW, LCSW Licensed Clinical Social Worker [facility] Care Management [phone]

## 2021-11-27 ENCOUNTER — Ambulatory Visit: Payer: Medicare Other | Admitting: Nurse Practitioner

## 2021-11-28 ENCOUNTER — Encounter: Payer: Self-pay | Admitting: Nurse Practitioner

## 2021-12-28 ENCOUNTER — Telehealth: Payer: Self-pay | Admitting: Nurse Practitioner

## 2022-01-03 DIAGNOSIS — D649 Anemia, unspecified: Secondary | ICD-10-CM | POA: Diagnosis not present

## 2022-01-03 DIAGNOSIS — E119 Type 2 diabetes mellitus without complications: Secondary | ICD-10-CM | POA: Diagnosis not present

## 2022-01-03 DIAGNOSIS — R0689 Other abnormalities of breathing: Secondary | ICD-10-CM | POA: Diagnosis not present

## 2022-01-03 DIAGNOSIS — I340 Nonrheumatic mitral (valve) insufficiency: Secondary | ICD-10-CM | POA: Diagnosis not present

## 2022-01-03 DIAGNOSIS — R918 Other nonspecific abnormal finding of lung field: Secondary | ICD-10-CM | POA: Diagnosis not present

## 2022-01-03 DIAGNOSIS — Z7982 Long term (current) use of aspirin: Secondary | ICD-10-CM | POA: Diagnosis not present

## 2022-01-03 DIAGNOSIS — Z8616 Personal history of COVID-19: Secondary | ICD-10-CM | POA: Diagnosis not present

## 2022-01-03 DIAGNOSIS — I110 Hypertensive heart disease with heart failure: Secondary | ICD-10-CM | POA: Diagnosis not present

## 2022-01-03 DIAGNOSIS — I5031 Acute diastolic (congestive) heart failure: Secondary | ICD-10-CM | POA: Diagnosis not present

## 2022-01-03 DIAGNOSIS — E7849 Other hyperlipidemia: Secondary | ICD-10-CM | POA: Diagnosis not present

## 2022-01-03 DIAGNOSIS — R739 Hyperglycemia, unspecified: Secondary | ICD-10-CM | POA: Diagnosis not present

## 2022-01-03 DIAGNOSIS — Z794 Long term (current) use of insulin: Secondary | ICD-10-CM | POA: Diagnosis not present

## 2022-01-03 DIAGNOSIS — I255 Ischemic cardiomyopathy: Secondary | ICD-10-CM | POA: Diagnosis not present

## 2022-01-03 DIAGNOSIS — J984 Other disorders of lung: Secondary | ICD-10-CM | POA: Diagnosis not present

## 2022-01-03 DIAGNOSIS — I509 Heart failure, unspecified: Secondary | ICD-10-CM | POA: Diagnosis not present

## 2022-01-03 DIAGNOSIS — R0902 Hypoxemia: Secondary | ICD-10-CM | POA: Diagnosis not present

## 2022-01-03 DIAGNOSIS — I517 Cardiomegaly: Secondary | ICD-10-CM | POA: Diagnosis not present

## 2022-01-03 DIAGNOSIS — R778 Other specified abnormalities of plasma proteins: Secondary | ICD-10-CM | POA: Diagnosis not present

## 2022-01-03 DIAGNOSIS — Z86711 Personal history of pulmonary embolism: Secondary | ICD-10-CM | POA: Insufficient documentation

## 2022-01-03 DIAGNOSIS — Z79899 Other long term (current) drug therapy: Secondary | ICD-10-CM | POA: Diagnosis not present

## 2022-01-03 DIAGNOSIS — R0602 Shortness of breath: Secondary | ICD-10-CM | POA: Diagnosis not present

## 2022-01-03 DIAGNOSIS — I10 Essential (primary) hypertension: Secondary | ICD-10-CM | POA: Diagnosis not present

## 2022-01-03 DIAGNOSIS — J9601 Acute respiratory failure with hypoxia: Secondary | ICD-10-CM | POA: Diagnosis not present

## 2022-01-03 DIAGNOSIS — Z7901 Long term (current) use of anticoagulants: Secondary | ICD-10-CM | POA: Diagnosis not present

## 2022-01-03 DIAGNOSIS — R7989 Other specified abnormal findings of blood chemistry: Secondary | ICD-10-CM | POA: Diagnosis not present

## 2022-01-03 DIAGNOSIS — I5033 Acute on chronic diastolic (congestive) heart failure: Secondary | ICD-10-CM | POA: Diagnosis not present

## 2022-01-03 DIAGNOSIS — Z87891 Personal history of nicotine dependence: Secondary | ICD-10-CM | POA: Diagnosis not present

## 2022-01-03 DIAGNOSIS — M109 Gout, unspecified: Secondary | ICD-10-CM | POA: Diagnosis not present

## 2022-01-03 DIAGNOSIS — J9811 Atelectasis: Secondary | ICD-10-CM | POA: Diagnosis not present

## 2022-01-03 DIAGNOSIS — I249 Acute ischemic heart disease, unspecified: Secondary | ICD-10-CM | POA: Diagnosis not present

## 2022-01-03 DIAGNOSIS — R079 Chest pain, unspecified: Secondary | ICD-10-CM | POA: Diagnosis not present

## 2022-01-03 DIAGNOSIS — J90 Pleural effusion, not elsewhere classified: Secondary | ICD-10-CM | POA: Diagnosis not present

## 2022-01-03 DIAGNOSIS — I359 Nonrheumatic aortic valve disorder, unspecified: Secondary | ICD-10-CM | POA: Diagnosis not present

## 2022-01-03 DIAGNOSIS — R0789 Other chest pain: Secondary | ICD-10-CM | POA: Diagnosis not present

## 2022-01-03 DIAGNOSIS — Z20822 Contact with and (suspected) exposure to covid-19: Secondary | ICD-10-CM | POA: Diagnosis not present

## 2022-01-03 DIAGNOSIS — E559 Vitamin D deficiency, unspecified: Secondary | ICD-10-CM | POA: Diagnosis not present

## 2022-01-03 DIAGNOSIS — I25110 Atherosclerotic heart disease of native coronary artery with unstable angina pectoris: Secondary | ICD-10-CM | POA: Diagnosis not present

## 2022-01-03 DIAGNOSIS — I214 Non-ST elevation (NSTEMI) myocardial infarction: Secondary | ICD-10-CM | POA: Diagnosis not present

## 2022-01-04 DIAGNOSIS — I10 Essential (primary) hypertension: Secondary | ICD-10-CM | POA: Diagnosis not present

## 2022-01-04 DIAGNOSIS — E119 Type 2 diabetes mellitus without complications: Secondary | ICD-10-CM | POA: Diagnosis not present

## 2022-01-04 DIAGNOSIS — E7849 Other hyperlipidemia: Secondary | ICD-10-CM | POA: Diagnosis not present

## 2022-01-04 DIAGNOSIS — R7989 Other specified abnormal findings of blood chemistry: Secondary | ICD-10-CM | POA: Diagnosis not present

## 2022-01-04 DIAGNOSIS — Z86711 Personal history of pulmonary embolism: Secondary | ICD-10-CM | POA: Diagnosis not present

## 2022-01-04 DIAGNOSIS — Z87891 Personal history of nicotine dependence: Secondary | ICD-10-CM | POA: Diagnosis not present

## 2022-01-04 DIAGNOSIS — I340 Nonrheumatic mitral (valve) insufficiency: Secondary | ICD-10-CM | POA: Diagnosis not present

## 2022-01-04 DIAGNOSIS — I214 Non-ST elevation (NSTEMI) myocardial infarction: Secondary | ICD-10-CM | POA: Diagnosis not present

## 2022-01-04 DIAGNOSIS — M109 Gout, unspecified: Secondary | ICD-10-CM | POA: Diagnosis not present

## 2022-01-04 DIAGNOSIS — D649 Anemia, unspecified: Secondary | ICD-10-CM | POA: Diagnosis not present

## 2022-01-04 DIAGNOSIS — I359 Nonrheumatic aortic valve disorder, unspecified: Secondary | ICD-10-CM | POA: Diagnosis not present

## 2022-01-04 DIAGNOSIS — I5031 Acute diastolic (congestive) heart failure: Secondary | ICD-10-CM | POA: Diagnosis not present

## 2022-01-04 DIAGNOSIS — R0602 Shortness of breath: Secondary | ICD-10-CM | POA: Diagnosis not present

## 2022-01-05 DIAGNOSIS — Z86711 Personal history of pulmonary embolism: Secondary | ICD-10-CM | POA: Diagnosis not present

## 2022-01-05 DIAGNOSIS — E119 Type 2 diabetes mellitus without complications: Secondary | ICD-10-CM | POA: Diagnosis not present

## 2022-01-05 DIAGNOSIS — E7849 Other hyperlipidemia: Secondary | ICD-10-CM | POA: Diagnosis not present

## 2022-01-05 DIAGNOSIS — I10 Essential (primary) hypertension: Secondary | ICD-10-CM | POA: Diagnosis not present

## 2022-01-05 DIAGNOSIS — D649 Anemia, unspecified: Secondary | ICD-10-CM | POA: Diagnosis not present

## 2022-01-05 DIAGNOSIS — I214 Non-ST elevation (NSTEMI) myocardial infarction: Secondary | ICD-10-CM | POA: Diagnosis not present

## 2022-01-05 DIAGNOSIS — M109 Gout, unspecified: Secondary | ICD-10-CM | POA: Diagnosis not present

## 2022-01-05 DIAGNOSIS — R7989 Other specified abnormal findings of blood chemistry: Secondary | ICD-10-CM | POA: Diagnosis not present

## 2022-01-05 DIAGNOSIS — Z87891 Personal history of nicotine dependence: Secondary | ICD-10-CM | POA: Diagnosis not present

## 2022-01-06 DIAGNOSIS — I25110 Atherosclerotic heart disease of native coronary artery with unstable angina pectoris: Secondary | ICD-10-CM | POA: Diagnosis not present

## 2022-01-06 DIAGNOSIS — E119 Type 2 diabetes mellitus without complications: Secondary | ICD-10-CM | POA: Diagnosis not present

## 2022-01-06 DIAGNOSIS — I110 Hypertensive heart disease with heart failure: Secondary | ICD-10-CM | POA: Diagnosis not present

## 2022-01-06 DIAGNOSIS — Z87891 Personal history of nicotine dependence: Secondary | ICD-10-CM | POA: Diagnosis not present

## 2022-01-06 DIAGNOSIS — J9601 Acute respiratory failure with hypoxia: Secondary | ICD-10-CM | POA: Diagnosis not present

## 2022-01-06 DIAGNOSIS — I509 Heart failure, unspecified: Secondary | ICD-10-CM | POA: Diagnosis not present

## 2022-01-06 DIAGNOSIS — E7849 Other hyperlipidemia: Secondary | ICD-10-CM | POA: Diagnosis not present

## 2022-01-06 DIAGNOSIS — R7989 Other specified abnormal findings of blood chemistry: Secondary | ICD-10-CM | POA: Diagnosis not present

## 2022-01-06 DIAGNOSIS — I10 Essential (primary) hypertension: Secondary | ICD-10-CM | POA: Diagnosis not present

## 2022-01-06 DIAGNOSIS — I214 Non-ST elevation (NSTEMI) myocardial infarction: Secondary | ICD-10-CM | POA: Diagnosis not present

## 2022-01-06 DIAGNOSIS — I5033 Acute on chronic diastolic (congestive) heart failure: Secondary | ICD-10-CM | POA: Diagnosis not present

## 2022-01-06 DIAGNOSIS — E559 Vitamin D deficiency, unspecified: Secondary | ICD-10-CM | POA: Diagnosis not present

## 2022-01-06 DIAGNOSIS — I255 Ischemic cardiomyopathy: Secondary | ICD-10-CM | POA: Diagnosis not present

## 2022-01-06 DIAGNOSIS — I249 Acute ischemic heart disease, unspecified: Secondary | ICD-10-CM | POA: Diagnosis not present

## 2022-01-06 DIAGNOSIS — R0602 Shortness of breath: Secondary | ICD-10-CM | POA: Diagnosis not present

## 2022-01-06 DIAGNOSIS — Z86711 Personal history of pulmonary embolism: Secondary | ICD-10-CM | POA: Diagnosis not present

## 2022-01-07 DIAGNOSIS — I214 Non-ST elevation (NSTEMI) myocardial infarction: Secondary | ICD-10-CM | POA: Diagnosis not present

## 2022-01-07 DIAGNOSIS — Z86711 Personal history of pulmonary embolism: Secondary | ICD-10-CM | POA: Diagnosis not present

## 2022-01-07 DIAGNOSIS — Z87891 Personal history of nicotine dependence: Secondary | ICD-10-CM | POA: Diagnosis not present

## 2022-01-07 DIAGNOSIS — J9601 Acute respiratory failure with hypoxia: Secondary | ICD-10-CM | POA: Diagnosis not present

## 2022-01-07 DIAGNOSIS — R7989 Other specified abnormal findings of blood chemistry: Secondary | ICD-10-CM | POA: Diagnosis not present

## 2022-01-08 DIAGNOSIS — J9601 Acute respiratory failure with hypoxia: Secondary | ICD-10-CM | POA: Diagnosis not present

## 2022-01-08 DIAGNOSIS — R7989 Other specified abnormal findings of blood chemistry: Secondary | ICD-10-CM | POA: Diagnosis not present

## 2022-01-08 DIAGNOSIS — Z86711 Personal history of pulmonary embolism: Secondary | ICD-10-CM | POA: Diagnosis not present

## 2022-01-08 DIAGNOSIS — I214 Non-ST elevation (NSTEMI) myocardial infarction: Secondary | ICD-10-CM | POA: Diagnosis not present

## 2022-01-08 DIAGNOSIS — Z87891 Personal history of nicotine dependence: Secondary | ICD-10-CM | POA: Diagnosis not present

## 2022-01-08 DIAGNOSIS — E119 Type 2 diabetes mellitus without complications: Secondary | ICD-10-CM | POA: Diagnosis not present

## 2022-01-08 DIAGNOSIS — I10 Essential (primary) hypertension: Secondary | ICD-10-CM | POA: Diagnosis not present

## 2022-01-09 DIAGNOSIS — I214 Non-ST elevation (NSTEMI) myocardial infarction: Secondary | ICD-10-CM | POA: Diagnosis not present

## 2022-01-09 DIAGNOSIS — Z87891 Personal history of nicotine dependence: Secondary | ICD-10-CM | POA: Diagnosis not present

## 2022-01-09 DIAGNOSIS — I10 Essential (primary) hypertension: Secondary | ICD-10-CM | POA: Diagnosis not present

## 2022-01-09 DIAGNOSIS — J9601 Acute respiratory failure with hypoxia: Secondary | ICD-10-CM | POA: Diagnosis not present

## 2022-01-09 DIAGNOSIS — E119 Type 2 diabetes mellitus without complications: Secondary | ICD-10-CM | POA: Diagnosis not present

## 2022-01-09 DIAGNOSIS — R7989 Other specified abnormal findings of blood chemistry: Secondary | ICD-10-CM | POA: Diagnosis not present

## 2022-01-09 DIAGNOSIS — Z86711 Personal history of pulmonary embolism: Secondary | ICD-10-CM | POA: Diagnosis not present

## 2022-01-11 ENCOUNTER — Other Ambulatory Visit: Payer: Self-pay | Admitting: Nurse Practitioner

## 2022-01-11 ENCOUNTER — Telehealth: Payer: Medicare Other

## 2022-01-11 DIAGNOSIS — R0609 Other forms of dyspnea: Secondary | ICD-10-CM

## 2022-01-16 DIAGNOSIS — I340 Nonrheumatic mitral (valve) insufficiency: Secondary | ICD-10-CM | POA: Diagnosis not present

## 2022-01-16 DIAGNOSIS — E785 Hyperlipidemia, unspecified: Secondary | ICD-10-CM | POA: Diagnosis not present

## 2022-01-16 DIAGNOSIS — E1169 Type 2 diabetes mellitus with other specified complication: Secondary | ICD-10-CM | POA: Diagnosis not present

## 2022-01-16 DIAGNOSIS — I2510 Atherosclerotic heart disease of native coronary artery without angina pectoris: Secondary | ICD-10-CM | POA: Diagnosis not present

## 2022-01-16 DIAGNOSIS — I272 Pulmonary hypertension, unspecified: Secondary | ICD-10-CM | POA: Diagnosis not present

## 2022-01-16 DIAGNOSIS — R0600 Dyspnea, unspecified: Secondary | ICD-10-CM | POA: Diagnosis not present

## 2022-01-19 DIAGNOSIS — R0600 Dyspnea, unspecified: Secondary | ICD-10-CM | POA: Diagnosis not present

## 2022-01-27 DIAGNOSIS — R9431 Abnormal electrocardiogram [ECG] [EKG]: Secondary | ICD-10-CM | POA: Diagnosis not present

## 2022-01-27 DIAGNOSIS — Z880 Allergy status to penicillin: Secondary | ICD-10-CM | POA: Diagnosis not present

## 2022-01-27 DIAGNOSIS — G319 Degenerative disease of nervous system, unspecified: Secondary | ICD-10-CM | POA: Diagnosis not present

## 2022-01-27 DIAGNOSIS — Z87891 Personal history of nicotine dependence: Secondary | ICD-10-CM | POA: Diagnosis not present

## 2022-01-27 DIAGNOSIS — I6782 Cerebral ischemia: Secondary | ICD-10-CM | POA: Diagnosis not present

## 2022-01-27 DIAGNOSIS — H109 Unspecified conjunctivitis: Secondary | ICD-10-CM | POA: Diagnosis not present

## 2022-01-27 DIAGNOSIS — N390 Urinary tract infection, site not specified: Secondary | ICD-10-CM | POA: Diagnosis not present

## 2022-01-27 DIAGNOSIS — E7849 Other hyperlipidemia: Secondary | ICD-10-CM | POA: Diagnosis not present

## 2022-01-27 DIAGNOSIS — Z79899 Other long term (current) drug therapy: Secondary | ICD-10-CM | POA: Diagnosis not present

## 2022-01-27 DIAGNOSIS — Z7901 Long term (current) use of anticoagulants: Secondary | ICD-10-CM | POA: Diagnosis not present

## 2022-01-27 DIAGNOSIS — I10 Essential (primary) hypertension: Secondary | ICD-10-CM | POA: Diagnosis not present

## 2022-01-27 DIAGNOSIS — Z7984 Long term (current) use of oral hypoglycemic drugs: Secondary | ICD-10-CM | POA: Diagnosis not present

## 2022-01-27 DIAGNOSIS — E119 Type 2 diabetes mellitus without complications: Secondary | ICD-10-CM | POA: Diagnosis not present

## 2022-01-27 DIAGNOSIS — H1031 Unspecified acute conjunctivitis, right eye: Secondary | ICD-10-CM | POA: Diagnosis not present

## 2022-02-04 DIAGNOSIS — K5900 Constipation, unspecified: Secondary | ICD-10-CM | POA: Diagnosis not present

## 2022-02-04 DIAGNOSIS — I700 Atherosclerosis of aorta: Secondary | ICD-10-CM | POA: Diagnosis not present

## 2022-02-04 DIAGNOSIS — I252 Old myocardial infarction: Secondary | ICD-10-CM | POA: Insufficient documentation

## 2022-02-04 DIAGNOSIS — Z79899 Other long term (current) drug therapy: Secondary | ICD-10-CM | POA: Diagnosis not present

## 2022-02-04 DIAGNOSIS — R531 Weakness: Secondary | ICD-10-CM | POA: Diagnosis not present

## 2022-02-04 DIAGNOSIS — I6622 Occlusion and stenosis of left posterior cerebral artery: Secondary | ICD-10-CM | POA: Diagnosis not present

## 2022-02-04 DIAGNOSIS — R6889 Other general symptoms and signs: Secondary | ICD-10-CM | POA: Diagnosis not present

## 2022-02-04 DIAGNOSIS — Z86711 Personal history of pulmonary embolism: Secondary | ICD-10-CM | POA: Diagnosis not present

## 2022-02-04 DIAGNOSIS — E119 Type 2 diabetes mellitus without complications: Secondary | ICD-10-CM | POA: Diagnosis not present

## 2022-02-04 DIAGNOSIS — G9340 Encephalopathy, unspecified: Secondary | ICD-10-CM | POA: Insufficient documentation

## 2022-02-04 DIAGNOSIS — H539 Unspecified visual disturbance: Secondary | ICD-10-CM | POA: Diagnosis not present

## 2022-02-04 DIAGNOSIS — E7849 Other hyperlipidemia: Secondary | ICD-10-CM | POA: Diagnosis not present

## 2022-02-04 DIAGNOSIS — Z8616 Personal history of COVID-19: Secondary | ICD-10-CM | POA: Insufficient documentation

## 2022-02-04 DIAGNOSIS — R4701 Aphasia: Secondary | ICD-10-CM | POA: Diagnosis not present

## 2022-02-04 DIAGNOSIS — I5031 Acute diastolic (congestive) heart failure: Secondary | ICD-10-CM | POA: Diagnosis not present

## 2022-02-04 DIAGNOSIS — E559 Vitamin D deficiency, unspecified: Secondary | ICD-10-CM | POA: Diagnosis not present

## 2022-02-04 DIAGNOSIS — I2510 Atherosclerotic heart disease of native coronary artery without angina pectoris: Secondary | ICD-10-CM | POA: Diagnosis not present

## 2022-02-04 DIAGNOSIS — N3000 Acute cystitis without hematuria: Secondary | ICD-10-CM | POA: Diagnosis not present

## 2022-02-04 DIAGNOSIS — R2689 Other abnormalities of gait and mobility: Secondary | ICD-10-CM | POA: Diagnosis not present

## 2022-02-04 DIAGNOSIS — I639 Cerebral infarction, unspecified: Secondary | ICD-10-CM | POA: Diagnosis not present

## 2022-02-04 DIAGNOSIS — I517 Cardiomegaly: Secondary | ICD-10-CM | POA: Diagnosis not present

## 2022-02-04 DIAGNOSIS — I214 Non-ST elevation (NSTEMI) myocardial infarction: Secondary | ICD-10-CM | POA: Diagnosis not present

## 2022-02-04 DIAGNOSIS — M109 Gout, unspecified: Secondary | ICD-10-CM | POA: Diagnosis not present

## 2022-02-04 DIAGNOSIS — Z7984 Long term (current) use of oral hypoglycemic drugs: Secondary | ICD-10-CM | POA: Diagnosis not present

## 2022-02-04 DIAGNOSIS — I6613 Occlusion and stenosis of bilateral anterior cerebral arteries: Secondary | ICD-10-CM | POA: Diagnosis not present

## 2022-02-04 DIAGNOSIS — G9349 Other encephalopathy: Secondary | ICD-10-CM | POA: Insufficient documentation

## 2022-02-04 DIAGNOSIS — Z9181 History of falling: Secondary | ICD-10-CM | POA: Diagnosis not present

## 2022-02-04 DIAGNOSIS — I63532 Cerebral infarction due to unspecified occlusion or stenosis of left posterior cerebral artery: Secondary | ICD-10-CM | POA: Diagnosis not present

## 2022-02-04 DIAGNOSIS — I110 Hypertensive heart disease with heart failure: Secondary | ICD-10-CM | POA: Diagnosis not present

## 2022-02-04 DIAGNOSIS — Z7902 Long term (current) use of antithrombotics/antiplatelets: Secondary | ICD-10-CM | POA: Diagnosis not present

## 2022-02-04 DIAGNOSIS — S80211A Abrasion, right knee, initial encounter: Secondary | ICD-10-CM | POA: Diagnosis not present

## 2022-02-04 DIAGNOSIS — I69312 Visuospatial deficit and spatial neglect following cerebral infarction: Secondary | ICD-10-CM | POA: Diagnosis not present

## 2022-02-04 DIAGNOSIS — H538 Other visual disturbances: Secondary | ICD-10-CM | POA: Diagnosis not present

## 2022-02-04 DIAGNOSIS — R451 Restlessness and agitation: Secondary | ICD-10-CM | POA: Diagnosis not present

## 2022-02-04 DIAGNOSIS — G319 Degenerative disease of nervous system, unspecified: Secondary | ICD-10-CM | POA: Diagnosis not present

## 2022-02-04 DIAGNOSIS — G4700 Insomnia, unspecified: Secondary | ICD-10-CM | POA: Diagnosis not present

## 2022-02-04 DIAGNOSIS — I10 Essential (primary) hypertension: Secondary | ICD-10-CM | POA: Diagnosis not present

## 2022-02-04 DIAGNOSIS — E1165 Type 2 diabetes mellitus with hyperglycemia: Secondary | ICD-10-CM | POA: Diagnosis not present

## 2022-02-04 DIAGNOSIS — I69398 Other sequelae of cerebral infarction: Secondary | ICD-10-CM | POA: Diagnosis not present

## 2022-02-04 DIAGNOSIS — I651 Occlusion and stenosis of basilar artery: Secondary | ICD-10-CM | POA: Diagnosis not present

## 2022-02-04 DIAGNOSIS — R410 Disorientation, unspecified: Secondary | ICD-10-CM | POA: Diagnosis not present

## 2022-02-04 DIAGNOSIS — I69315 Cognitive social or emotional deficit following cerebral infarction: Secondary | ICD-10-CM | POA: Diagnosis not present

## 2022-02-04 DIAGNOSIS — Z87891 Personal history of nicotine dependence: Secondary | ICD-10-CM | POA: Diagnosis not present

## 2022-02-04 DIAGNOSIS — N3090 Cystitis, unspecified without hematuria: Secondary | ICD-10-CM | POA: Diagnosis not present

## 2022-02-04 DIAGNOSIS — R0989 Other specified symptoms and signs involving the circulatory and respiratory systems: Secondary | ICD-10-CM | POA: Diagnosis not present

## 2022-02-04 DIAGNOSIS — Z743 Need for continuous supervision: Secondary | ICD-10-CM | POA: Diagnosis not present

## 2022-02-04 DIAGNOSIS — I6389 Other cerebral infarction: Secondary | ICD-10-CM | POA: Diagnosis not present

## 2022-02-04 DIAGNOSIS — I69320 Aphasia following cerebral infarction: Secondary | ICD-10-CM | POA: Diagnosis not present

## 2022-02-05 ENCOUNTER — Ambulatory Visit (INDEPENDENT_AMBULATORY_CARE_PROVIDER_SITE_OTHER): Payer: Medicare Other

## 2022-02-05 DIAGNOSIS — E785 Hyperlipidemia, unspecified: Secondary | ICD-10-CM

## 2022-02-05 DIAGNOSIS — E1142 Type 2 diabetes mellitus with diabetic polyneuropathy: Secondary | ICD-10-CM

## 2022-02-05 DIAGNOSIS — E559 Vitamin D deficiency, unspecified: Secondary | ICD-10-CM

## 2022-02-05 DIAGNOSIS — I63532 Cerebral infarction due to unspecified occlusion or stenosis of left posterior cerebral artery: Secondary | ICD-10-CM | POA: Insufficient documentation

## 2022-02-05 DIAGNOSIS — R4701 Aphasia: Secondary | ICD-10-CM | POA: Insufficient documentation

## 2022-02-05 DIAGNOSIS — I10 Essential (primary) hypertension: Secondary | ICD-10-CM

## 2022-02-05 DIAGNOSIS — M1A9XX0 Chronic gout, unspecified, without tophus (tophi): Secondary | ICD-10-CM

## 2022-02-05 DIAGNOSIS — G459 Transient cerebral ischemic attack, unspecified: Secondary | ICD-10-CM | POA: Insufficient documentation

## 2022-02-05 NOTE — Chronic Care Management (AMB) (Signed)
Chronic Care Management    Clinical Social Work Note  02/05/2022 Name: Katelyn Ballard MRN: [id number] DOB: March 21, 1945  Katelyn Ballard is a 77 y.o. year old female who is a primary care patient of Chevis Pretty, [place]. The CCM team was consulted to assist the patient with chronic disease management and/or care coordination needs related to: [employer] .   Engaged with patient by telephone for follow up visit in response to provider referral for social work chronic care management and care coordination services.   Consent to Services:  The patient was given information about Chronic Care Management services, agreed to services, and gave verbal consent prior to initiation of services.  Please see initial visit note for detailed documentation.   Patient agreed to services and consent obtained.   Assessment: Review of patient past medical history, allergies, medications, and health status, including review of relevant consultants reports was performed today as part of a comprehensive evaluation and provision of chronic care management and care coordination services.     SDOH (Social Determinants of Health) assessments and interventions performed:  SDOH Interventions    Flowsheet Row Most Recent Value  SDOH Interventions   Physical Activity Interventions Other (Comments)  [client is currently at [facility] in [place], [place]. client is receiving medical care and physical therapy care]  Stress Interventions Other (Comment)  [client has stress related to managing medical needs]  Depression Interventions/Treatment  --  [LCSW has previously informed client of LCSW support and of RNCM support]        Advanced Directives Status: See Vynca application for related entries.  CCM Care Plan  Allergies  Allergen Reactions   Atorvastatin Other (See Comments)    myalgias   Penicillins     Outpatient Encounter Medications as of 02/05/2022  Medication Sig    allopurinol (ZYLOPRIM) 300 MG tablet Take 1 tablet (300 mg total) by mouth daily.   amLODipine (NORVASC) 10 MG tablet Take 1 tablet (10 mg total) by mouth daily.   blood glucose meter kit and supplies Check blood sugar bid and as needed Dx: E11.42   blood glucose meter kit and supplies Dispense based on patient and insurance preference. Use up to four times daily as directed. (FOR ICD-10 E10.9, E11.9).   fenofibrate 160 MG tablet Take 1 tablet (160 mg total) by mouth daily. (Needs to be seen before next refill)   glimepiride (AMARYL) 4 MG tablet Take 2 tablets (8 mg total) by mouth daily before breakfast.   glucose blood (ONETOUCH ULTRA) test strip USE 1 STRIP TO CHECK GLUCOSE 2 TO 3 TIMES DAILY   lisinopril (ZESTRIL) 40 MG tablet Take 1 tablet (40 mg total) by mouth daily. (Needs to be seen before next refill)   metFORMIN (GLUCOPHAGE) 1000 MG tablet Take 1 tablet (1,000 mg total) by mouth 2 (two) times daily with a meal.   rosuvastatin (CRESTOR) 10 MG tablet Take 1 tablet (10 mg total) by mouth daily.   No facility-administered encounter medications on file as of 02/05/2022.    Patient Active Problem List   Diagnosis Date Noted   Trigger finger, left middle finger 02/26/2019   BMI 33.0-33.9,adult 10/04/2015   Vitamin D deficiency 04/08/2015   Diabetes ([place]) 05/03/2014   Hypertension 10/22/2013   Hyperlipidemia with target LDL less than 100 10/22/2013   Gout 10/22/2013    Conditions to be addressed/monitored: monitor client completion of ADLs. Monitor Level of Care needs of client  Care Plan : LCSW care plan  Updates made by Katha Cabal, LCSW since 02/05/2022 12:00 AM     Problem: Coping Skills (General Plan of Care)      Goal: Coping Skills Enhanced: Manage housing needs;Manage financial needs. Complete ADLs as able   Start Date: 02/05/2022  Expected End Date: 05/03/2022  This Visit's Progress: Not on track  Recent Progress: On track  Priority: Medium  Note:   Current  barriers:   Housing challenges Mobility issues Breathing challenges Financial challenges  Clinical Goals:  LCSW to call client in next 30 days to discuss housing needs of client LCSW to communicate with client in next 30 days to discuss mobility of client and level of care issues with client Client to call LCSW or RNCM as needed for CCM support in next 30 days Client to attend all scheduled medical appointments in next 30 days  Clinical Interventions:  Collaboration with Chevis Pretty, [place] regarding development and update of comprehensive plan of care as evidenced by provider attestation and co-signature Discussed needs of client. Client is currently a patient at [facility] in [place], [place].  Client is receiving medical care at that hospital and is receiving physical therapy support at that hospital Client had a recent stroke and it may be difficult for client to continue living alone at her home. Plan is for client and her daughter to talk with SW at [facility] to discuss SNF care options for client at this time.  Client completed MRI test today and client is awaiting MRI results.   Client has some family support from her daughter, Katelyn Ballard. Client has been residing for the past number of months at the home of her son.  Client is hoping to receive care at the hospital for several more days and is hoping to go to SNF of her choice for rehabilitation.  It may be hard for client to afford to stay at SNF longer than 20 days; client was having financial challenges prior to this hospitalization. Discussed mobility of client. Client had been using a walker prior to stroke. Reviewed energy level of client. Client has reduced energy Collaborated with RNCM regarding current needs of client  Patient Coping Skills  Takes medications as scheduled Attends scheduled medical appointments  Patient Deficits: Housing challenges Mobility challenges Memory issues  Patient  Goals:   Call LCSW or RNCM in next 30 days as needed for support Attend scheduled medical appointments in next 30 days Take prescribed medications as ordered -  Follow Up Plan: LCSW to call client on 03/29/22 at 10:00 AM to assess client needs     Norva Riffle.Babs Dabbs MSW, [place] representative [facility] Care Management [phone]

## 2022-02-05 NOTE — Patient Instructions (Addendum)
Visit Information  Patient Goals:  Protect My Health (patient). Manage anxiety issues. Manage housing issues. Manage financial issues  Timeframe:  Short-Term Goal Priority:  Medium Progress; Not On Track Start Date:            02/05/22                Expected End Date:          05/03/22            Follow Up Date 03/29/22 at 10:00 AM   Protect My Health (Patient)    Manage anxiety issues; manage housing issues  ; manage financial issues   Why is this important?   Screening tests can find diseases early when they are easier to treat.  Your doctor or nurse will talk with you about which tests are important for you.  Getting shots for common diseases like the flu and shingles will help prevent them.     Patient Coping Skills Completes ADLs as needed Takes medications as scheduled Attends scheduled medical appointments Drives to appointments  Patient Deficits: Housing challenges Mobility challenges  Patient Goals:   Call LCSW or RNCM in next 30 days as needed for support Attend scheduled medical appointments in next 30 days Take prescribed medications as ordered -  Follow Up Plan: LCSW to call client on 03/29/22 at 10:00 AM to assess client needs   Norva Riffle.Melvenia Favela MSW, [place] Holiday representative [facility] Care Management [phone]

## 2022-02-13 DIAGNOSIS — R2689 Other abnormalities of gait and mobility: Secondary | ICD-10-CM | POA: Diagnosis not present

## 2022-02-13 DIAGNOSIS — E1165 Type 2 diabetes mellitus with hyperglycemia: Secondary | ICD-10-CM | POA: Diagnosis not present

## 2022-02-13 DIAGNOSIS — I69320 Aphasia following cerebral infarction: Secondary | ICD-10-CM | POA: Diagnosis not present

## 2022-02-13 DIAGNOSIS — I5031 Acute diastolic (congestive) heart failure: Secondary | ICD-10-CM | POA: Diagnosis not present

## 2022-02-13 DIAGNOSIS — R4701 Aphasia: Secondary | ICD-10-CM | POA: Diagnosis not present

## 2022-02-13 DIAGNOSIS — H538 Other visual disturbances: Secondary | ICD-10-CM | POA: Diagnosis not present

## 2022-02-13 DIAGNOSIS — I69398 Other sequelae of cerebral infarction: Secondary | ICD-10-CM | POA: Diagnosis not present

## 2022-02-13 DIAGNOSIS — I63532 Cerebral infarction due to unspecified occlusion or stenosis of left posterior cerebral artery: Secondary | ICD-10-CM | POA: Diagnosis not present

## 2022-02-13 DIAGNOSIS — I110 Hypertensive heart disease with heart failure: Secondary | ICD-10-CM | POA: Diagnosis not present

## 2022-02-13 DIAGNOSIS — I10 Essential (primary) hypertension: Secondary | ICD-10-CM | POA: Diagnosis not present

## 2022-02-13 DIAGNOSIS — Z7409 Other reduced mobility: Secondary | ICD-10-CM | POA: Diagnosis not present

## 2022-02-13 DIAGNOSIS — Z86711 Personal history of pulmonary embolism: Secondary | ICD-10-CM | POA: Diagnosis not present

## 2022-02-13 DIAGNOSIS — E119 Type 2 diabetes mellitus without complications: Secondary | ICD-10-CM | POA: Diagnosis not present

## 2022-02-13 DIAGNOSIS — N3000 Acute cystitis without hematuria: Secondary | ICD-10-CM | POA: Diagnosis not present

## 2022-02-13 DIAGNOSIS — G462 Posterior cerebral artery syndrome: Secondary | ICD-10-CM | POA: Diagnosis not present

## 2022-02-13 DIAGNOSIS — I69315 Cognitive social or emotional deficit following cerebral infarction: Secondary | ICD-10-CM | POA: Diagnosis not present

## 2022-02-13 DIAGNOSIS — E7849 Other hyperlipidemia: Secondary | ICD-10-CM | POA: Diagnosis not present

## 2022-02-13 DIAGNOSIS — M109 Gout, unspecified: Secondary | ICD-10-CM | POA: Diagnosis not present

## 2022-02-13 DIAGNOSIS — I69312 Visuospatial deficit and spatial neglect following cerebral infarction: Secondary | ICD-10-CM | POA: Diagnosis not present

## 2022-02-13 DIAGNOSIS — I252 Old myocardial infarction: Secondary | ICD-10-CM | POA: Diagnosis not present

## 2022-02-13 DIAGNOSIS — R410 Disorientation, unspecified: Secondary | ICD-10-CM | POA: Diagnosis not present

## 2022-02-13 DIAGNOSIS — K5900 Constipation, unspecified: Secondary | ICD-10-CM | POA: Diagnosis not present

## 2022-02-13 DIAGNOSIS — G9349 Other encephalopathy: Secondary | ICD-10-CM | POA: Diagnosis not present

## 2022-02-13 DIAGNOSIS — G4700 Insomnia, unspecified: Secondary | ICD-10-CM | POA: Diagnosis not present

## 2022-02-14 DIAGNOSIS — Z86711 Personal history of pulmonary embolism: Secondary | ICD-10-CM | POA: Diagnosis not present

## 2022-02-14 DIAGNOSIS — I63532 Cerebral infarction due to unspecified occlusion or stenosis of left posterior cerebral artery: Secondary | ICD-10-CM | POA: Diagnosis not present

## 2022-02-14 DIAGNOSIS — I252 Old myocardial infarction: Secondary | ICD-10-CM | POA: Diagnosis not present

## 2022-02-14 DIAGNOSIS — G462 Posterior cerebral artery syndrome: Secondary | ICD-10-CM | POA: Diagnosis not present

## 2022-02-14 DIAGNOSIS — Z7409 Other reduced mobility: Secondary | ICD-10-CM | POA: Diagnosis not present

## 2022-02-14 DIAGNOSIS — I10 Essential (primary) hypertension: Secondary | ICD-10-CM | POA: Diagnosis not present

## 2022-02-14 DIAGNOSIS — E119 Type 2 diabetes mellitus without complications: Secondary | ICD-10-CM | POA: Diagnosis not present

## 2022-02-14 DIAGNOSIS — I5031 Acute diastolic (congestive) heart failure: Secondary | ICD-10-CM | POA: Diagnosis not present

## 2022-02-14 DIAGNOSIS — R4701 Aphasia: Secondary | ICD-10-CM | POA: Diagnosis not present

## 2022-02-15 DIAGNOSIS — R4701 Aphasia: Secondary | ICD-10-CM | POA: Diagnosis not present

## 2022-02-15 DIAGNOSIS — I5031 Acute diastolic (congestive) heart failure: Secondary | ICD-10-CM | POA: Diagnosis not present

## 2022-02-15 DIAGNOSIS — G462 Posterior cerebral artery syndrome: Secondary | ICD-10-CM | POA: Diagnosis not present

## 2022-02-15 DIAGNOSIS — I10 Essential (primary) hypertension: Secondary | ICD-10-CM | POA: Diagnosis not present

## 2022-02-15 DIAGNOSIS — Z7409 Other reduced mobility: Secondary | ICD-10-CM | POA: Diagnosis not present

## 2022-02-16 DIAGNOSIS — I5031 Acute diastolic (congestive) heart failure: Secondary | ICD-10-CM | POA: Diagnosis not present

## 2022-02-16 DIAGNOSIS — I10 Essential (primary) hypertension: Secondary | ICD-10-CM | POA: Diagnosis not present

## 2022-02-16 DIAGNOSIS — G462 Posterior cerebral artery syndrome: Secondary | ICD-10-CM | POA: Diagnosis not present

## 2022-02-16 DIAGNOSIS — E119 Type 2 diabetes mellitus without complications: Secondary | ICD-10-CM | POA: Diagnosis not present

## 2022-02-16 DIAGNOSIS — Z7409 Other reduced mobility: Secondary | ICD-10-CM | POA: Diagnosis not present

## 2022-02-16 DIAGNOSIS — R4701 Aphasia: Secondary | ICD-10-CM | POA: Diagnosis not present

## 2022-02-19 DIAGNOSIS — I10 Essential (primary) hypertension: Secondary | ICD-10-CM | POA: Diagnosis not present

## 2022-02-19 DIAGNOSIS — G462 Posterior cerebral artery syndrome: Secondary | ICD-10-CM | POA: Diagnosis not present

## 2022-02-19 DIAGNOSIS — R4701 Aphasia: Secondary | ICD-10-CM | POA: Diagnosis not present

## 2022-02-19 DIAGNOSIS — Z7409 Other reduced mobility: Secondary | ICD-10-CM | POA: Diagnosis not present

## 2022-02-19 DIAGNOSIS — I5031 Acute diastolic (congestive) heart failure: Secondary | ICD-10-CM | POA: Diagnosis not present

## 2022-02-20 DIAGNOSIS — G462 Posterior cerebral artery syndrome: Secondary | ICD-10-CM | POA: Diagnosis not present

## 2022-02-20 DIAGNOSIS — Z86711 Personal history of pulmonary embolism: Secondary | ICD-10-CM | POA: Diagnosis not present

## 2022-02-20 DIAGNOSIS — I5031 Acute diastolic (congestive) heart failure: Secondary | ICD-10-CM | POA: Diagnosis not present

## 2022-02-20 DIAGNOSIS — I10 Essential (primary) hypertension: Secondary | ICD-10-CM | POA: Diagnosis not present

## 2022-02-20 DIAGNOSIS — I252 Old myocardial infarction: Secondary | ICD-10-CM | POA: Diagnosis not present

## 2022-02-20 DIAGNOSIS — E7849 Other hyperlipidemia: Secondary | ICD-10-CM | POA: Diagnosis not present

## 2022-02-20 DIAGNOSIS — E119 Type 2 diabetes mellitus without complications: Secondary | ICD-10-CM | POA: Diagnosis not present

## 2022-02-20 DIAGNOSIS — I63532 Cerebral infarction due to unspecified occlusion or stenosis of left posterior cerebral artery: Secondary | ICD-10-CM | POA: Diagnosis not present

## 2022-02-20 DIAGNOSIS — R4701 Aphasia: Secondary | ICD-10-CM | POA: Diagnosis not present

## 2022-02-20 DIAGNOSIS — Z7409 Other reduced mobility: Secondary | ICD-10-CM | POA: Diagnosis not present

## 2022-02-21 DIAGNOSIS — I5031 Acute diastolic (congestive) heart failure: Secondary | ICD-10-CM | POA: Diagnosis not present

## 2022-02-21 DIAGNOSIS — Z7409 Other reduced mobility: Secondary | ICD-10-CM | POA: Diagnosis not present

## 2022-02-21 DIAGNOSIS — G462 Posterior cerebral artery syndrome: Secondary | ICD-10-CM | POA: Diagnosis not present

## 2022-02-21 DIAGNOSIS — I10 Essential (primary) hypertension: Secondary | ICD-10-CM | POA: Diagnosis not present

## 2022-02-21 DIAGNOSIS — R4701 Aphasia: Secondary | ICD-10-CM | POA: Diagnosis not present

## 2022-02-22 DIAGNOSIS — Z7409 Other reduced mobility: Secondary | ICD-10-CM | POA: Diagnosis not present

## 2022-02-22 DIAGNOSIS — I10 Essential (primary) hypertension: Secondary | ICD-10-CM | POA: Diagnosis not present

## 2022-02-22 DIAGNOSIS — I5031 Acute diastolic (congestive) heart failure: Secondary | ICD-10-CM | POA: Diagnosis not present

## 2022-02-22 DIAGNOSIS — G462 Posterior cerebral artery syndrome: Secondary | ICD-10-CM | POA: Diagnosis not present

## 2022-02-22 DIAGNOSIS — R4701 Aphasia: Secondary | ICD-10-CM | POA: Diagnosis not present

## 2022-02-23 DIAGNOSIS — G462 Posterior cerebral artery syndrome: Secondary | ICD-10-CM | POA: Diagnosis not present

## 2022-02-23 DIAGNOSIS — Z7409 Other reduced mobility: Secondary | ICD-10-CM | POA: Diagnosis not present

## 2022-02-23 DIAGNOSIS — I5031 Acute diastolic (congestive) heart failure: Secondary | ICD-10-CM | POA: Diagnosis not present

## 2022-02-23 DIAGNOSIS — R4701 Aphasia: Secondary | ICD-10-CM | POA: Diagnosis not present

## 2022-02-23 DIAGNOSIS — I10 Essential (primary) hypertension: Secondary | ICD-10-CM | POA: Diagnosis not present

## 2022-02-25 DIAGNOSIS — I252 Old myocardial infarction: Secondary | ICD-10-CM | POA: Diagnosis not present

## 2022-02-25 DIAGNOSIS — E7849 Other hyperlipidemia: Secondary | ICD-10-CM | POA: Diagnosis not present

## 2022-02-25 DIAGNOSIS — I63532 Cerebral infarction due to unspecified occlusion or stenosis of left posterior cerebral artery: Secondary | ICD-10-CM | POA: Diagnosis not present

## 2022-02-25 DIAGNOSIS — I10 Essential (primary) hypertension: Secondary | ICD-10-CM | POA: Diagnosis not present

## 2022-02-25 DIAGNOSIS — Z86711 Personal history of pulmonary embolism: Secondary | ICD-10-CM | POA: Diagnosis not present

## 2022-02-25 DIAGNOSIS — E119 Type 2 diabetes mellitus without complications: Secondary | ICD-10-CM | POA: Diagnosis not present

## 2022-02-26 DIAGNOSIS — Z7409 Other reduced mobility: Secondary | ICD-10-CM | POA: Diagnosis not present

## 2022-02-26 DIAGNOSIS — R4701 Aphasia: Secondary | ICD-10-CM | POA: Diagnosis not present

## 2022-02-26 DIAGNOSIS — G462 Posterior cerebral artery syndrome: Secondary | ICD-10-CM | POA: Diagnosis not present

## 2022-02-26 DIAGNOSIS — I5031 Acute diastolic (congestive) heart failure: Secondary | ICD-10-CM | POA: Diagnosis not present

## 2022-02-26 DIAGNOSIS — I10 Essential (primary) hypertension: Secondary | ICD-10-CM | POA: Diagnosis not present

## 2022-02-28 DIAGNOSIS — I5031 Acute diastolic (congestive) heart failure: Secondary | ICD-10-CM | POA: Diagnosis not present

## 2022-02-28 DIAGNOSIS — Z7409 Other reduced mobility: Secondary | ICD-10-CM | POA: Diagnosis not present

## 2022-02-28 DIAGNOSIS — I10 Essential (primary) hypertension: Secondary | ICD-10-CM | POA: Diagnosis not present

## 2022-02-28 DIAGNOSIS — G462 Posterior cerebral artery syndrome: Secondary | ICD-10-CM | POA: Diagnosis not present

## 2022-02-28 DIAGNOSIS — R4701 Aphasia: Secondary | ICD-10-CM | POA: Diagnosis not present

## 2022-03-01 DIAGNOSIS — I5031 Acute diastolic (congestive) heart failure: Secondary | ICD-10-CM | POA: Diagnosis not present

## 2022-03-01 DIAGNOSIS — Z7409 Other reduced mobility: Secondary | ICD-10-CM | POA: Diagnosis not present

## 2022-03-01 DIAGNOSIS — G462 Posterior cerebral artery syndrome: Secondary | ICD-10-CM | POA: Diagnosis not present

## 2022-03-01 DIAGNOSIS — I10 Essential (primary) hypertension: Secondary | ICD-10-CM | POA: Diagnosis not present

## 2022-03-01 DIAGNOSIS — R4701 Aphasia: Secondary | ICD-10-CM | POA: Diagnosis not present

## 2022-03-09 DIAGNOSIS — F0284 Dementia in other diseases classified elsewhere, unspecified severity, with anxiety: Secondary | ICD-10-CM | POA: Diagnosis not present

## 2022-03-09 DIAGNOSIS — F0394 Unspecified dementia, unspecified severity, with anxiety: Secondary | ICD-10-CM | POA: Diagnosis not present

## 2022-03-15 DIAGNOSIS — F0284 Dementia in other diseases classified elsewhere, unspecified severity, with anxiety: Secondary | ICD-10-CM | POA: Diagnosis not present

## 2022-03-15 DIAGNOSIS — E782 Mixed hyperlipidemia: Secondary | ICD-10-CM | POA: Diagnosis not present

## 2022-03-15 DIAGNOSIS — E119 Type 2 diabetes mellitus without complications: Secondary | ICD-10-CM | POA: Diagnosis not present

## 2022-03-15 DIAGNOSIS — F0394 Unspecified dementia, unspecified severity, with anxiety: Secondary | ICD-10-CM | POA: Diagnosis not present

## 2022-03-15 DIAGNOSIS — I10 Essential (primary) hypertension: Secondary | ICD-10-CM | POA: Diagnosis not present

## 2022-03-15 DIAGNOSIS — Z8673 Personal history of transient ischemic attack (TIA), and cerebral infarction without residual deficits: Secondary | ICD-10-CM | POA: Diagnosis not present

## 2022-03-17 DIAGNOSIS — G300 Alzheimer's disease with early onset: Secondary | ICD-10-CM | POA: Diagnosis not present

## 2022-03-17 DIAGNOSIS — F02B4 Dementia in other diseases classified elsewhere, moderate, with anxiety: Secondary | ICD-10-CM | POA: Diagnosis not present

## 2022-03-18 ENCOUNTER — Other Ambulatory Visit: Payer: Self-pay | Admitting: Nurse Practitioner

## 2022-03-18 DIAGNOSIS — E1142 Type 2 diabetes mellitus with diabetic polyneuropathy: Secondary | ICD-10-CM

## 2022-03-20 DIAGNOSIS — M6281 Muscle weakness (generalized): Secondary | ICD-10-CM | POA: Diagnosis not present

## 2022-03-20 DIAGNOSIS — R296 Repeated falls: Secondary | ICD-10-CM | POA: Diagnosis not present

## 2022-03-27 DIAGNOSIS — I10 Essential (primary) hypertension: Secondary | ICD-10-CM | POA: Diagnosis not present

## 2022-03-27 DIAGNOSIS — E119 Type 2 diabetes mellitus without complications: Secondary | ICD-10-CM | POA: Diagnosis not present

## 2022-03-27 DIAGNOSIS — E559 Vitamin D deficiency, unspecified: Secondary | ICD-10-CM | POA: Diagnosis not present

## 2022-03-27 DIAGNOSIS — E039 Hypothyroidism, unspecified: Secondary | ICD-10-CM | POA: Diagnosis not present

## 2022-03-27 DIAGNOSIS — E782 Mixed hyperlipidemia: Secondary | ICD-10-CM | POA: Diagnosis not present

## 2022-03-29 ENCOUNTER — Telehealth: Payer: Medicare Other

## 2022-03-29 DIAGNOSIS — E785 Hyperlipidemia, unspecified: Secondary | ICD-10-CM | POA: Diagnosis not present

## 2022-03-29 DIAGNOSIS — I5032 Chronic diastolic (congestive) heart failure: Secondary | ICD-10-CM | POA: Diagnosis not present

## 2022-03-29 DIAGNOSIS — Z8673 Personal history of transient ischemic attack (TIA), and cerebral infarction without residual deficits: Secondary | ICD-10-CM | POA: Diagnosis not present

## 2022-03-29 DIAGNOSIS — I10 Essential (primary) hypertension: Secondary | ICD-10-CM | POA: Diagnosis not present

## 2022-03-29 DIAGNOSIS — I2510 Atherosclerotic heart disease of native coronary artery without angina pectoris: Secondary | ICD-10-CM | POA: Diagnosis not present

## 2022-04-03 DIAGNOSIS — I10 Essential (primary) hypertension: Secondary | ICD-10-CM | POA: Diagnosis not present

## 2022-04-03 DIAGNOSIS — Z7902 Long term (current) use of antithrombotics/antiplatelets: Secondary | ICD-10-CM | POA: Diagnosis not present

## 2022-04-03 DIAGNOSIS — F0394 Unspecified dementia, unspecified severity, with anxiety: Secondary | ICD-10-CM | POA: Diagnosis not present

## 2022-04-03 DIAGNOSIS — Z955 Presence of coronary angioplasty implant and graft: Secondary | ICD-10-CM | POA: Diagnosis not present

## 2022-04-03 DIAGNOSIS — Z7984 Long term (current) use of oral hypoglycemic drugs: Secondary | ICD-10-CM | POA: Diagnosis not present

## 2022-04-03 DIAGNOSIS — G894 Chronic pain syndrome: Secondary | ICD-10-CM | POA: Diagnosis not present

## 2022-04-03 DIAGNOSIS — Z7982 Long term (current) use of aspirin: Secondary | ICD-10-CM | POA: Diagnosis not present

## 2022-04-03 DIAGNOSIS — M109 Gout, unspecified: Secondary | ICD-10-CM | POA: Diagnosis not present

## 2022-04-03 DIAGNOSIS — E785 Hyperlipidemia, unspecified: Secondary | ICD-10-CM | POA: Diagnosis not present

## 2022-04-03 DIAGNOSIS — Z8673 Personal history of transient ischemic attack (TIA), and cerebral infarction without residual deficits: Secondary | ICD-10-CM | POA: Diagnosis not present

## 2022-04-03 DIAGNOSIS — F0393 Unspecified dementia, unspecified severity, with mood disturbance: Secondary | ICD-10-CM | POA: Diagnosis not present

## 2022-04-03 DIAGNOSIS — E119 Type 2 diabetes mellitus without complications: Secondary | ICD-10-CM | POA: Diagnosis not present

## 2022-04-03 DIAGNOSIS — Z9181 History of falling: Secondary | ICD-10-CM | POA: Diagnosis not present

## 2022-04-09 DIAGNOSIS — I10 Essential (primary) hypertension: Secondary | ICD-10-CM | POA: Diagnosis not present

## 2022-04-09 DIAGNOSIS — F0394 Unspecified dementia, unspecified severity, with anxiety: Secondary | ICD-10-CM | POA: Diagnosis not present

## 2022-04-09 DIAGNOSIS — G894 Chronic pain syndrome: Secondary | ICD-10-CM | POA: Diagnosis not present

## 2022-04-09 DIAGNOSIS — E785 Hyperlipidemia, unspecified: Secondary | ICD-10-CM | POA: Diagnosis not present

## 2022-04-09 DIAGNOSIS — Z8673 Personal history of transient ischemic attack (TIA), and cerebral infarction without residual deficits: Secondary | ICD-10-CM | POA: Diagnosis not present

## 2022-04-09 DIAGNOSIS — E119 Type 2 diabetes mellitus without complications: Secondary | ICD-10-CM | POA: Diagnosis not present

## 2022-04-09 DIAGNOSIS — F0393 Unspecified dementia, unspecified severity, with mood disturbance: Secondary | ICD-10-CM | POA: Diagnosis not present

## 2022-04-09 DIAGNOSIS — Z7982 Long term (current) use of aspirin: Secondary | ICD-10-CM | POA: Diagnosis not present

## 2022-04-09 DIAGNOSIS — Z7902 Long term (current) use of antithrombotics/antiplatelets: Secondary | ICD-10-CM | POA: Diagnosis not present

## 2022-04-09 DIAGNOSIS — Z9181 History of falling: Secondary | ICD-10-CM | POA: Diagnosis not present

## 2022-04-09 DIAGNOSIS — M109 Gout, unspecified: Secondary | ICD-10-CM | POA: Diagnosis not present

## 2022-04-09 DIAGNOSIS — Z7984 Long term (current) use of oral hypoglycemic drugs: Secondary | ICD-10-CM | POA: Diagnosis not present

## 2022-04-09 DIAGNOSIS — Z955 Presence of coronary angioplasty implant and graft: Secondary | ICD-10-CM | POA: Diagnosis not present

## 2022-04-10 DIAGNOSIS — E782 Mixed hyperlipidemia: Secondary | ICD-10-CM | POA: Diagnosis not present

## 2022-04-10 DIAGNOSIS — E119 Type 2 diabetes mellitus without complications: Secondary | ICD-10-CM | POA: Diagnosis not present

## 2022-04-10 DIAGNOSIS — M6281 Muscle weakness (generalized): Secondary | ICD-10-CM | POA: Diagnosis not present

## 2022-04-11 DIAGNOSIS — F0394 Unspecified dementia, unspecified severity, with anxiety: Secondary | ICD-10-CM | POA: Diagnosis not present

## 2022-04-11 DIAGNOSIS — Z955 Presence of coronary angioplasty implant and graft: Secondary | ICD-10-CM | POA: Diagnosis not present

## 2022-04-11 DIAGNOSIS — E785 Hyperlipidemia, unspecified: Secondary | ICD-10-CM | POA: Diagnosis not present

## 2022-04-11 DIAGNOSIS — F0393 Unspecified dementia, unspecified severity, with mood disturbance: Secondary | ICD-10-CM | POA: Diagnosis not present

## 2022-04-11 DIAGNOSIS — Z8673 Personal history of transient ischemic attack (TIA), and cerebral infarction without residual deficits: Secondary | ICD-10-CM | POA: Diagnosis not present

## 2022-04-11 DIAGNOSIS — Z7902 Long term (current) use of antithrombotics/antiplatelets: Secondary | ICD-10-CM | POA: Diagnosis not present

## 2022-04-11 DIAGNOSIS — G894 Chronic pain syndrome: Secondary | ICD-10-CM | POA: Diagnosis not present

## 2022-04-11 DIAGNOSIS — M109 Gout, unspecified: Secondary | ICD-10-CM | POA: Diagnosis not present

## 2022-04-11 DIAGNOSIS — Z9181 History of falling: Secondary | ICD-10-CM | POA: Diagnosis not present

## 2022-04-11 DIAGNOSIS — I10 Essential (primary) hypertension: Secondary | ICD-10-CM | POA: Diagnosis not present

## 2022-04-11 DIAGNOSIS — Z7984 Long term (current) use of oral hypoglycemic drugs: Secondary | ICD-10-CM | POA: Diagnosis not present

## 2022-04-11 DIAGNOSIS — E119 Type 2 diabetes mellitus without complications: Secondary | ICD-10-CM | POA: Diagnosis not present

## 2022-04-11 DIAGNOSIS — Z7982 Long term (current) use of aspirin: Secondary | ICD-10-CM | POA: Diagnosis not present

## 2022-04-16 DIAGNOSIS — F0393 Unspecified dementia, unspecified severity, with mood disturbance: Secondary | ICD-10-CM | POA: Diagnosis not present

## 2022-04-16 DIAGNOSIS — F0394 Unspecified dementia, unspecified severity, with anxiety: Secondary | ICD-10-CM | POA: Diagnosis not present

## 2022-04-16 DIAGNOSIS — Z7982 Long term (current) use of aspirin: Secondary | ICD-10-CM | POA: Diagnosis not present

## 2022-04-16 DIAGNOSIS — M109 Gout, unspecified: Secondary | ICD-10-CM | POA: Diagnosis not present

## 2022-04-16 DIAGNOSIS — Z8673 Personal history of transient ischemic attack (TIA), and cerebral infarction without residual deficits: Secondary | ICD-10-CM | POA: Diagnosis not present

## 2022-04-16 DIAGNOSIS — Z9181 History of falling: Secondary | ICD-10-CM | POA: Diagnosis not present

## 2022-04-16 DIAGNOSIS — G894 Chronic pain syndrome: Secondary | ICD-10-CM | POA: Diagnosis not present

## 2022-04-16 DIAGNOSIS — Z955 Presence of coronary angioplasty implant and graft: Secondary | ICD-10-CM | POA: Diagnosis not present

## 2022-04-16 DIAGNOSIS — Z7902 Long term (current) use of antithrombotics/antiplatelets: Secondary | ICD-10-CM | POA: Diagnosis not present

## 2022-04-16 DIAGNOSIS — E785 Hyperlipidemia, unspecified: Secondary | ICD-10-CM | POA: Diagnosis not present

## 2022-04-16 DIAGNOSIS — Z7984 Long term (current) use of oral hypoglycemic drugs: Secondary | ICD-10-CM | POA: Diagnosis not present

## 2022-04-16 DIAGNOSIS — E119 Type 2 diabetes mellitus without complications: Secondary | ICD-10-CM | POA: Diagnosis not present

## 2022-04-16 DIAGNOSIS — I10 Essential (primary) hypertension: Secondary | ICD-10-CM | POA: Diagnosis not present

## 2022-04-17 DIAGNOSIS — E119 Type 2 diabetes mellitus without complications: Secondary | ICD-10-CM | POA: Diagnosis not present

## 2022-04-17 DIAGNOSIS — Z7902 Long term (current) use of antithrombotics/antiplatelets: Secondary | ICD-10-CM | POA: Diagnosis not present

## 2022-04-17 DIAGNOSIS — M109 Gout, unspecified: Secondary | ICD-10-CM | POA: Diagnosis not present

## 2022-04-17 DIAGNOSIS — G894 Chronic pain syndrome: Secondary | ICD-10-CM | POA: Diagnosis not present

## 2022-04-17 DIAGNOSIS — Z7984 Long term (current) use of oral hypoglycemic drugs: Secondary | ICD-10-CM | POA: Diagnosis not present

## 2022-04-17 DIAGNOSIS — I10 Essential (primary) hypertension: Secondary | ICD-10-CM | POA: Diagnosis not present

## 2022-04-17 DIAGNOSIS — Z955 Presence of coronary angioplasty implant and graft: Secondary | ICD-10-CM | POA: Diagnosis not present

## 2022-04-17 DIAGNOSIS — Z9181 History of falling: Secondary | ICD-10-CM | POA: Diagnosis not present

## 2022-04-17 DIAGNOSIS — G300 Alzheimer's disease with early onset: Secondary | ICD-10-CM | POA: Diagnosis not present

## 2022-04-17 DIAGNOSIS — F0393 Unspecified dementia, unspecified severity, with mood disturbance: Secondary | ICD-10-CM | POA: Diagnosis not present

## 2022-04-17 DIAGNOSIS — F0394 Unspecified dementia, unspecified severity, with anxiety: Secondary | ICD-10-CM | POA: Diagnosis not present

## 2022-04-17 DIAGNOSIS — F02B4 Dementia in other diseases classified elsewhere, moderate, with anxiety: Secondary | ICD-10-CM | POA: Diagnosis not present

## 2022-04-17 DIAGNOSIS — Z7982 Long term (current) use of aspirin: Secondary | ICD-10-CM | POA: Diagnosis not present

## 2022-04-17 DIAGNOSIS — E785 Hyperlipidemia, unspecified: Secondary | ICD-10-CM | POA: Diagnosis not present

## 2022-04-17 DIAGNOSIS — Z8673 Personal history of transient ischemic attack (TIA), and cerebral infarction without residual deficits: Secondary | ICD-10-CM | POA: Diagnosis not present

## 2022-04-18 DIAGNOSIS — M109 Gout, unspecified: Secondary | ICD-10-CM | POA: Diagnosis not present

## 2022-04-18 DIAGNOSIS — Z7902 Long term (current) use of antithrombotics/antiplatelets: Secondary | ICD-10-CM | POA: Diagnosis not present

## 2022-04-18 DIAGNOSIS — Z8673 Personal history of transient ischemic attack (TIA), and cerebral infarction without residual deficits: Secondary | ICD-10-CM | POA: Diagnosis not present

## 2022-04-18 DIAGNOSIS — Z7982 Long term (current) use of aspirin: Secondary | ICD-10-CM | POA: Diagnosis not present

## 2022-04-18 DIAGNOSIS — Z9181 History of falling: Secondary | ICD-10-CM | POA: Diagnosis not present

## 2022-04-18 DIAGNOSIS — F0393 Unspecified dementia, unspecified severity, with mood disturbance: Secondary | ICD-10-CM | POA: Diagnosis not present

## 2022-04-18 DIAGNOSIS — I10 Essential (primary) hypertension: Secondary | ICD-10-CM | POA: Diagnosis not present

## 2022-04-18 DIAGNOSIS — Z955 Presence of coronary angioplasty implant and graft: Secondary | ICD-10-CM | POA: Diagnosis not present

## 2022-04-18 DIAGNOSIS — G894 Chronic pain syndrome: Secondary | ICD-10-CM | POA: Diagnosis not present

## 2022-04-18 DIAGNOSIS — E785 Hyperlipidemia, unspecified: Secondary | ICD-10-CM | POA: Diagnosis not present

## 2022-04-18 DIAGNOSIS — Z7984 Long term (current) use of oral hypoglycemic drugs: Secondary | ICD-10-CM | POA: Diagnosis not present

## 2022-04-18 DIAGNOSIS — E119 Type 2 diabetes mellitus without complications: Secondary | ICD-10-CM | POA: Diagnosis not present

## 2022-04-18 DIAGNOSIS — F0394 Unspecified dementia, unspecified severity, with anxiety: Secondary | ICD-10-CM | POA: Diagnosis not present

## 2022-04-19 DIAGNOSIS — E119 Type 2 diabetes mellitus without complications: Secondary | ICD-10-CM | POA: Diagnosis not present

## 2022-04-19 DIAGNOSIS — Z7984 Long term (current) use of oral hypoglycemic drugs: Secondary | ICD-10-CM | POA: Diagnosis not present

## 2022-04-19 DIAGNOSIS — I10 Essential (primary) hypertension: Secondary | ICD-10-CM | POA: Diagnosis not present

## 2022-04-19 DIAGNOSIS — Z7982 Long term (current) use of aspirin: Secondary | ICD-10-CM | POA: Diagnosis not present

## 2022-04-19 DIAGNOSIS — F0393 Unspecified dementia, unspecified severity, with mood disturbance: Secondary | ICD-10-CM | POA: Diagnosis not present

## 2022-04-19 DIAGNOSIS — Z9181 History of falling: Secondary | ICD-10-CM | POA: Diagnosis not present

## 2022-04-19 DIAGNOSIS — Z8673 Personal history of transient ischemic attack (TIA), and cerebral infarction without residual deficits: Secondary | ICD-10-CM | POA: Diagnosis not present

## 2022-04-19 DIAGNOSIS — F0394 Unspecified dementia, unspecified severity, with anxiety: Secondary | ICD-10-CM | POA: Diagnosis not present

## 2022-04-19 DIAGNOSIS — M109 Gout, unspecified: Secondary | ICD-10-CM | POA: Diagnosis not present

## 2022-04-19 DIAGNOSIS — G894 Chronic pain syndrome: Secondary | ICD-10-CM | POA: Diagnosis not present

## 2022-04-19 DIAGNOSIS — Z955 Presence of coronary angioplasty implant and graft: Secondary | ICD-10-CM | POA: Diagnosis not present

## 2022-04-19 DIAGNOSIS — E785 Hyperlipidemia, unspecified: Secondary | ICD-10-CM | POA: Diagnosis not present

## 2022-04-19 DIAGNOSIS — Z7902 Long term (current) use of antithrombotics/antiplatelets: Secondary | ICD-10-CM | POA: Diagnosis not present

## 2022-04-24 DIAGNOSIS — F0393 Unspecified dementia, unspecified severity, with mood disturbance: Secondary | ICD-10-CM | POA: Diagnosis not present

## 2022-04-24 DIAGNOSIS — E119 Type 2 diabetes mellitus without complications: Secondary | ICD-10-CM | POA: Diagnosis not present

## 2022-04-24 DIAGNOSIS — I10 Essential (primary) hypertension: Secondary | ICD-10-CM | POA: Diagnosis not present

## 2022-04-24 DIAGNOSIS — Z7984 Long term (current) use of oral hypoglycemic drugs: Secondary | ICD-10-CM | POA: Diagnosis not present

## 2022-04-24 DIAGNOSIS — Z955 Presence of coronary angioplasty implant and graft: Secondary | ICD-10-CM | POA: Diagnosis not present

## 2022-04-24 DIAGNOSIS — F0394 Unspecified dementia, unspecified severity, with anxiety: Secondary | ICD-10-CM | POA: Diagnosis not present

## 2022-04-24 DIAGNOSIS — Z9181 History of falling: Secondary | ICD-10-CM | POA: Diagnosis not present

## 2022-04-24 DIAGNOSIS — G894 Chronic pain syndrome: Secondary | ICD-10-CM | POA: Diagnosis not present

## 2022-04-24 DIAGNOSIS — E785 Hyperlipidemia, unspecified: Secondary | ICD-10-CM | POA: Diagnosis not present

## 2022-04-24 DIAGNOSIS — M109 Gout, unspecified: Secondary | ICD-10-CM | POA: Diagnosis not present

## 2022-04-24 DIAGNOSIS — Z8673 Personal history of transient ischemic attack (TIA), and cerebral infarction without residual deficits: Secondary | ICD-10-CM | POA: Diagnosis not present

## 2022-04-24 DIAGNOSIS — Z7982 Long term (current) use of aspirin: Secondary | ICD-10-CM | POA: Diagnosis not present

## 2022-04-24 DIAGNOSIS — Z7902 Long term (current) use of antithrombotics/antiplatelets: Secondary | ICD-10-CM | POA: Diagnosis not present

## 2022-04-25 DIAGNOSIS — Z7984 Long term (current) use of oral hypoglycemic drugs: Secondary | ICD-10-CM | POA: Diagnosis not present

## 2022-04-25 DIAGNOSIS — F0394 Unspecified dementia, unspecified severity, with anxiety: Secondary | ICD-10-CM | POA: Diagnosis not present

## 2022-04-25 DIAGNOSIS — E119 Type 2 diabetes mellitus without complications: Secondary | ICD-10-CM | POA: Diagnosis not present

## 2022-04-25 DIAGNOSIS — Z7982 Long term (current) use of aspirin: Secondary | ICD-10-CM | POA: Diagnosis not present

## 2022-04-25 DIAGNOSIS — Z9181 History of falling: Secondary | ICD-10-CM | POA: Diagnosis not present

## 2022-04-25 DIAGNOSIS — F0393 Unspecified dementia, unspecified severity, with mood disturbance: Secondary | ICD-10-CM | POA: Diagnosis not present

## 2022-04-25 DIAGNOSIS — E785 Hyperlipidemia, unspecified: Secondary | ICD-10-CM | POA: Diagnosis not present

## 2022-04-25 DIAGNOSIS — M109 Gout, unspecified: Secondary | ICD-10-CM | POA: Diagnosis not present

## 2022-04-25 DIAGNOSIS — Z7902 Long term (current) use of antithrombotics/antiplatelets: Secondary | ICD-10-CM | POA: Diagnosis not present

## 2022-04-25 DIAGNOSIS — Z955 Presence of coronary angioplasty implant and graft: Secondary | ICD-10-CM | POA: Diagnosis not present

## 2022-04-25 DIAGNOSIS — I10 Essential (primary) hypertension: Secondary | ICD-10-CM | POA: Diagnosis not present

## 2022-04-25 DIAGNOSIS — G894 Chronic pain syndrome: Secondary | ICD-10-CM | POA: Diagnosis not present

## 2022-04-25 DIAGNOSIS — Z8673 Personal history of transient ischemic attack (TIA), and cerebral infarction without residual deficits: Secondary | ICD-10-CM | POA: Diagnosis not present

## 2022-04-26 DIAGNOSIS — M109 Gout, unspecified: Secondary | ICD-10-CM | POA: Diagnosis not present

## 2022-04-26 DIAGNOSIS — E785 Hyperlipidemia, unspecified: Secondary | ICD-10-CM | POA: Diagnosis not present

## 2022-04-26 DIAGNOSIS — F0393 Unspecified dementia, unspecified severity, with mood disturbance: Secondary | ICD-10-CM | POA: Diagnosis not present

## 2022-04-26 DIAGNOSIS — Z7984 Long term (current) use of oral hypoglycemic drugs: Secondary | ICD-10-CM | POA: Diagnosis not present

## 2022-04-26 DIAGNOSIS — Z7902 Long term (current) use of antithrombotics/antiplatelets: Secondary | ICD-10-CM | POA: Diagnosis not present

## 2022-04-26 DIAGNOSIS — Z8673 Personal history of transient ischemic attack (TIA), and cerebral infarction without residual deficits: Secondary | ICD-10-CM | POA: Diagnosis not present

## 2022-04-26 DIAGNOSIS — E119 Type 2 diabetes mellitus without complications: Secondary | ICD-10-CM | POA: Diagnosis not present

## 2022-04-26 DIAGNOSIS — Z9181 History of falling: Secondary | ICD-10-CM | POA: Diagnosis not present

## 2022-04-26 DIAGNOSIS — Z955 Presence of coronary angioplasty implant and graft: Secondary | ICD-10-CM | POA: Diagnosis not present

## 2022-04-26 DIAGNOSIS — F0394 Unspecified dementia, unspecified severity, with anxiety: Secondary | ICD-10-CM | POA: Diagnosis not present

## 2022-04-26 DIAGNOSIS — I10 Essential (primary) hypertension: Secondary | ICD-10-CM | POA: Diagnosis not present

## 2022-04-26 DIAGNOSIS — G894 Chronic pain syndrome: Secondary | ICD-10-CM | POA: Diagnosis not present

## 2022-04-26 DIAGNOSIS — Z7982 Long term (current) use of aspirin: Secondary | ICD-10-CM | POA: Diagnosis not present

## 2022-04-30 DIAGNOSIS — F0393 Unspecified dementia, unspecified severity, with mood disturbance: Secondary | ICD-10-CM | POA: Diagnosis not present

## 2022-04-30 DIAGNOSIS — I10 Essential (primary) hypertension: Secondary | ICD-10-CM | POA: Diagnosis not present

## 2022-04-30 DIAGNOSIS — E782 Mixed hyperlipidemia: Secondary | ICD-10-CM | POA: Diagnosis not present

## 2022-04-30 DIAGNOSIS — E119 Type 2 diabetes mellitus without complications: Secondary | ICD-10-CM | POA: Diagnosis not present

## 2022-05-01 DIAGNOSIS — I10 Essential (primary) hypertension: Secondary | ICD-10-CM | POA: Diagnosis not present

## 2022-05-01 DIAGNOSIS — Z9181 History of falling: Secondary | ICD-10-CM | POA: Diagnosis not present

## 2022-05-01 DIAGNOSIS — G894 Chronic pain syndrome: Secondary | ICD-10-CM | POA: Diagnosis not present

## 2022-05-01 DIAGNOSIS — Z8673 Personal history of transient ischemic attack (TIA), and cerebral infarction without residual deficits: Secondary | ICD-10-CM | POA: Diagnosis not present

## 2022-05-01 DIAGNOSIS — E119 Type 2 diabetes mellitus without complications: Secondary | ICD-10-CM | POA: Diagnosis not present

## 2022-05-01 DIAGNOSIS — Z7982 Long term (current) use of aspirin: Secondary | ICD-10-CM | POA: Diagnosis not present

## 2022-05-01 DIAGNOSIS — Z7984 Long term (current) use of oral hypoglycemic drugs: Secondary | ICD-10-CM | POA: Diagnosis not present

## 2022-05-01 DIAGNOSIS — Z7902 Long term (current) use of antithrombotics/antiplatelets: Secondary | ICD-10-CM | POA: Diagnosis not present

## 2022-05-01 DIAGNOSIS — E782 Mixed hyperlipidemia: Secondary | ICD-10-CM | POA: Diagnosis not present

## 2022-05-01 DIAGNOSIS — E785 Hyperlipidemia, unspecified: Secondary | ICD-10-CM | POA: Diagnosis not present

## 2022-05-01 DIAGNOSIS — M109 Gout, unspecified: Secondary | ICD-10-CM | POA: Diagnosis not present

## 2022-05-01 DIAGNOSIS — Z955 Presence of coronary angioplasty implant and graft: Secondary | ICD-10-CM | POA: Diagnosis not present

## 2022-05-01 DIAGNOSIS — F0394 Unspecified dementia, unspecified severity, with anxiety: Secondary | ICD-10-CM | POA: Diagnosis not present

## 2022-05-01 DIAGNOSIS — F0393 Unspecified dementia, unspecified severity, with mood disturbance: Secondary | ICD-10-CM | POA: Diagnosis not present

## 2022-05-02 DIAGNOSIS — F0394 Unspecified dementia, unspecified severity, with anxiety: Secondary | ICD-10-CM | POA: Diagnosis not present

## 2022-05-02 DIAGNOSIS — Z955 Presence of coronary angioplasty implant and graft: Secondary | ICD-10-CM | POA: Diagnosis not present

## 2022-05-02 DIAGNOSIS — F0393 Unspecified dementia, unspecified severity, with mood disturbance: Secondary | ICD-10-CM | POA: Diagnosis not present

## 2022-05-02 DIAGNOSIS — E119 Type 2 diabetes mellitus without complications: Secondary | ICD-10-CM | POA: Diagnosis not present

## 2022-05-02 DIAGNOSIS — Z7982 Long term (current) use of aspirin: Secondary | ICD-10-CM | POA: Diagnosis not present

## 2022-05-02 DIAGNOSIS — Z7902 Long term (current) use of antithrombotics/antiplatelets: Secondary | ICD-10-CM | POA: Diagnosis not present

## 2022-05-02 DIAGNOSIS — Z9181 History of falling: Secondary | ICD-10-CM | POA: Diagnosis not present

## 2022-05-02 DIAGNOSIS — M109 Gout, unspecified: Secondary | ICD-10-CM | POA: Diagnosis not present

## 2022-05-02 DIAGNOSIS — Z8673 Personal history of transient ischemic attack (TIA), and cerebral infarction without residual deficits: Secondary | ICD-10-CM | POA: Diagnosis not present

## 2022-05-02 DIAGNOSIS — I10 Essential (primary) hypertension: Secondary | ICD-10-CM | POA: Diagnosis not present

## 2022-05-02 DIAGNOSIS — G894 Chronic pain syndrome: Secondary | ICD-10-CM | POA: Diagnosis not present

## 2022-05-02 DIAGNOSIS — E785 Hyperlipidemia, unspecified: Secondary | ICD-10-CM | POA: Diagnosis not present

## 2022-05-02 DIAGNOSIS — Z7984 Long term (current) use of oral hypoglycemic drugs: Secondary | ICD-10-CM | POA: Diagnosis not present

## 2022-05-03 DIAGNOSIS — Z955 Presence of coronary angioplasty implant and graft: Secondary | ICD-10-CM | POA: Diagnosis not present

## 2022-05-03 DIAGNOSIS — I10 Essential (primary) hypertension: Secondary | ICD-10-CM | POA: Diagnosis not present

## 2022-05-03 DIAGNOSIS — F0394 Unspecified dementia, unspecified severity, with anxiety: Secondary | ICD-10-CM | POA: Diagnosis not present

## 2022-05-03 DIAGNOSIS — E785 Hyperlipidemia, unspecified: Secondary | ICD-10-CM | POA: Diagnosis not present

## 2022-05-03 DIAGNOSIS — Z7902 Long term (current) use of antithrombotics/antiplatelets: Secondary | ICD-10-CM | POA: Diagnosis not present

## 2022-05-03 DIAGNOSIS — G894 Chronic pain syndrome: Secondary | ICD-10-CM | POA: Diagnosis not present

## 2022-05-03 DIAGNOSIS — Z7984 Long term (current) use of oral hypoglycemic drugs: Secondary | ICD-10-CM | POA: Diagnosis not present

## 2022-05-03 DIAGNOSIS — Z9181 History of falling: Secondary | ICD-10-CM | POA: Diagnosis not present

## 2022-05-03 DIAGNOSIS — F0393 Unspecified dementia, unspecified severity, with mood disturbance: Secondary | ICD-10-CM | POA: Diagnosis not present

## 2022-05-03 DIAGNOSIS — M109 Gout, unspecified: Secondary | ICD-10-CM | POA: Diagnosis not present

## 2022-05-03 DIAGNOSIS — Z7982 Long term (current) use of aspirin: Secondary | ICD-10-CM | POA: Diagnosis not present

## 2022-05-03 DIAGNOSIS — E119 Type 2 diabetes mellitus without complications: Secondary | ICD-10-CM | POA: Diagnosis not present

## 2022-05-03 DIAGNOSIS — Z8673 Personal history of transient ischemic attack (TIA), and cerebral infarction without residual deficits: Secondary | ICD-10-CM | POA: Diagnosis not present

## 2022-05-07 DIAGNOSIS — Z7902 Long term (current) use of antithrombotics/antiplatelets: Secondary | ICD-10-CM | POA: Diagnosis not present

## 2022-05-07 DIAGNOSIS — F0393 Unspecified dementia, unspecified severity, with mood disturbance: Secondary | ICD-10-CM | POA: Diagnosis not present

## 2022-05-07 DIAGNOSIS — Z8673 Personal history of transient ischemic attack (TIA), and cerebral infarction without residual deficits: Secondary | ICD-10-CM | POA: Diagnosis not present

## 2022-05-07 DIAGNOSIS — I10 Essential (primary) hypertension: Secondary | ICD-10-CM | POA: Diagnosis not present

## 2022-05-07 DIAGNOSIS — E119 Type 2 diabetes mellitus without complications: Secondary | ICD-10-CM | POA: Diagnosis not present

## 2022-05-07 DIAGNOSIS — F0394 Unspecified dementia, unspecified severity, with anxiety: Secondary | ICD-10-CM | POA: Diagnosis not present

## 2022-05-07 DIAGNOSIS — Z9181 History of falling: Secondary | ICD-10-CM | POA: Diagnosis not present

## 2022-05-07 DIAGNOSIS — E785 Hyperlipidemia, unspecified: Secondary | ICD-10-CM | POA: Diagnosis not present

## 2022-05-07 DIAGNOSIS — M109 Gout, unspecified: Secondary | ICD-10-CM | POA: Diagnosis not present

## 2022-05-07 DIAGNOSIS — G894 Chronic pain syndrome: Secondary | ICD-10-CM | POA: Diagnosis not present

## 2022-05-07 DIAGNOSIS — Z7982 Long term (current) use of aspirin: Secondary | ICD-10-CM | POA: Diagnosis not present

## 2022-05-07 DIAGNOSIS — Z7984 Long term (current) use of oral hypoglycemic drugs: Secondary | ICD-10-CM | POA: Diagnosis not present

## 2022-05-07 DIAGNOSIS — Z955 Presence of coronary angioplasty implant and graft: Secondary | ICD-10-CM | POA: Diagnosis not present

## 2022-05-08 DIAGNOSIS — F0394 Unspecified dementia, unspecified severity, with anxiety: Secondary | ICD-10-CM | POA: Diagnosis not present

## 2022-05-08 DIAGNOSIS — E785 Hyperlipidemia, unspecified: Secondary | ICD-10-CM | POA: Diagnosis not present

## 2022-05-08 DIAGNOSIS — Z7902 Long term (current) use of antithrombotics/antiplatelets: Secondary | ICD-10-CM | POA: Diagnosis not present

## 2022-05-08 DIAGNOSIS — I10 Essential (primary) hypertension: Secondary | ICD-10-CM | POA: Diagnosis not present

## 2022-05-08 DIAGNOSIS — Z9181 History of falling: Secondary | ICD-10-CM | POA: Diagnosis not present

## 2022-05-08 DIAGNOSIS — F0393 Unspecified dementia, unspecified severity, with mood disturbance: Secondary | ICD-10-CM | POA: Diagnosis not present

## 2022-05-08 DIAGNOSIS — Z7982 Long term (current) use of aspirin: Secondary | ICD-10-CM | POA: Diagnosis not present

## 2022-05-08 DIAGNOSIS — Z955 Presence of coronary angioplasty implant and graft: Secondary | ICD-10-CM | POA: Diagnosis not present

## 2022-05-08 DIAGNOSIS — M109 Gout, unspecified: Secondary | ICD-10-CM | POA: Diagnosis not present

## 2022-05-08 DIAGNOSIS — Z8673 Personal history of transient ischemic attack (TIA), and cerebral infarction without residual deficits: Secondary | ICD-10-CM | POA: Diagnosis not present

## 2022-05-08 DIAGNOSIS — G894 Chronic pain syndrome: Secondary | ICD-10-CM | POA: Diagnosis not present

## 2022-05-08 DIAGNOSIS — Z7984 Long term (current) use of oral hypoglycemic drugs: Secondary | ICD-10-CM | POA: Diagnosis not present

## 2022-05-08 DIAGNOSIS — E119 Type 2 diabetes mellitus without complications: Secondary | ICD-10-CM | POA: Diagnosis not present

## 2022-05-11 DIAGNOSIS — Z9181 History of falling: Secondary | ICD-10-CM | POA: Diagnosis not present

## 2022-05-11 DIAGNOSIS — M109 Gout, unspecified: Secondary | ICD-10-CM | POA: Diagnosis not present

## 2022-05-11 DIAGNOSIS — E785 Hyperlipidemia, unspecified: Secondary | ICD-10-CM | POA: Diagnosis not present

## 2022-05-11 DIAGNOSIS — E119 Type 2 diabetes mellitus without complications: Secondary | ICD-10-CM | POA: Diagnosis not present

## 2022-05-11 DIAGNOSIS — G894 Chronic pain syndrome: Secondary | ICD-10-CM | POA: Diagnosis not present

## 2022-05-11 DIAGNOSIS — Z7984 Long term (current) use of oral hypoglycemic drugs: Secondary | ICD-10-CM | POA: Diagnosis not present

## 2022-05-11 DIAGNOSIS — Z7902 Long term (current) use of antithrombotics/antiplatelets: Secondary | ICD-10-CM | POA: Diagnosis not present

## 2022-05-11 DIAGNOSIS — F0393 Unspecified dementia, unspecified severity, with mood disturbance: Secondary | ICD-10-CM | POA: Diagnosis not present

## 2022-05-11 DIAGNOSIS — I10 Essential (primary) hypertension: Secondary | ICD-10-CM | POA: Diagnosis not present

## 2022-05-11 DIAGNOSIS — Z955 Presence of coronary angioplasty implant and graft: Secondary | ICD-10-CM | POA: Diagnosis not present

## 2022-05-11 DIAGNOSIS — Z8673 Personal history of transient ischemic attack (TIA), and cerebral infarction without residual deficits: Secondary | ICD-10-CM | POA: Diagnosis not present

## 2022-05-11 DIAGNOSIS — Z7982 Long term (current) use of aspirin: Secondary | ICD-10-CM | POA: Diagnosis not present

## 2022-05-11 DIAGNOSIS — F0394 Unspecified dementia, unspecified severity, with anxiety: Secondary | ICD-10-CM | POA: Diagnosis not present

## 2022-05-15 DIAGNOSIS — M109 Gout, unspecified: Secondary | ICD-10-CM | POA: Diagnosis not present

## 2022-05-15 DIAGNOSIS — Z9181 History of falling: Secondary | ICD-10-CM | POA: Diagnosis not present

## 2022-05-15 DIAGNOSIS — Z7984 Long term (current) use of oral hypoglycemic drugs: Secondary | ICD-10-CM | POA: Diagnosis not present

## 2022-05-15 DIAGNOSIS — F0394 Unspecified dementia, unspecified severity, with anxiety: Secondary | ICD-10-CM | POA: Diagnosis not present

## 2022-05-15 DIAGNOSIS — Z7902 Long term (current) use of antithrombotics/antiplatelets: Secondary | ICD-10-CM | POA: Diagnosis not present

## 2022-05-15 DIAGNOSIS — F02B4 Dementia in other diseases classified elsewhere, moderate, with anxiety: Secondary | ICD-10-CM | POA: Diagnosis not present

## 2022-05-15 DIAGNOSIS — G300 Alzheimer's disease with early onset: Secondary | ICD-10-CM | POA: Diagnosis not present

## 2022-05-15 DIAGNOSIS — G894 Chronic pain syndrome: Secondary | ICD-10-CM | POA: Diagnosis not present

## 2022-05-15 DIAGNOSIS — Z7982 Long term (current) use of aspirin: Secondary | ICD-10-CM | POA: Diagnosis not present

## 2022-05-15 DIAGNOSIS — Z8673 Personal history of transient ischemic attack (TIA), and cerebral infarction without residual deficits: Secondary | ICD-10-CM | POA: Diagnosis not present

## 2022-05-15 DIAGNOSIS — F0393 Unspecified dementia, unspecified severity, with mood disturbance: Secondary | ICD-10-CM | POA: Diagnosis not present

## 2022-05-15 DIAGNOSIS — I10 Essential (primary) hypertension: Secondary | ICD-10-CM | POA: Diagnosis not present

## 2022-05-15 DIAGNOSIS — Z955 Presence of coronary angioplasty implant and graft: Secondary | ICD-10-CM | POA: Diagnosis not present

## 2022-05-15 DIAGNOSIS — E785 Hyperlipidemia, unspecified: Secondary | ICD-10-CM | POA: Diagnosis not present

## 2022-05-15 DIAGNOSIS — E119 Type 2 diabetes mellitus without complications: Secondary | ICD-10-CM | POA: Diagnosis not present

## 2022-05-18 DIAGNOSIS — Z8673 Personal history of transient ischemic attack (TIA), and cerebral infarction without residual deficits: Secondary | ICD-10-CM | POA: Diagnosis not present

## 2022-05-18 DIAGNOSIS — M109 Gout, unspecified: Secondary | ICD-10-CM | POA: Diagnosis not present

## 2022-05-18 DIAGNOSIS — Z7902 Long term (current) use of antithrombotics/antiplatelets: Secondary | ICD-10-CM | POA: Diagnosis not present

## 2022-05-18 DIAGNOSIS — F0393 Unspecified dementia, unspecified severity, with mood disturbance: Secondary | ICD-10-CM | POA: Diagnosis not present

## 2022-05-18 DIAGNOSIS — F0394 Unspecified dementia, unspecified severity, with anxiety: Secondary | ICD-10-CM | POA: Diagnosis not present

## 2022-05-18 DIAGNOSIS — Z9181 History of falling: Secondary | ICD-10-CM | POA: Diagnosis not present

## 2022-05-18 DIAGNOSIS — G894 Chronic pain syndrome: Secondary | ICD-10-CM | POA: Diagnosis not present

## 2022-05-18 DIAGNOSIS — E785 Hyperlipidemia, unspecified: Secondary | ICD-10-CM | POA: Diagnosis not present

## 2022-05-18 DIAGNOSIS — E119 Type 2 diabetes mellitus without complications: Secondary | ICD-10-CM | POA: Diagnosis not present

## 2022-05-18 DIAGNOSIS — I10 Essential (primary) hypertension: Secondary | ICD-10-CM | POA: Diagnosis not present

## 2022-05-18 DIAGNOSIS — Z955 Presence of coronary angioplasty implant and graft: Secondary | ICD-10-CM | POA: Diagnosis not present

## 2022-05-18 DIAGNOSIS — Z7984 Long term (current) use of oral hypoglycemic drugs: Secondary | ICD-10-CM | POA: Diagnosis not present

## 2022-05-18 DIAGNOSIS — Z7982 Long term (current) use of aspirin: Secondary | ICD-10-CM | POA: Diagnosis not present

## 2022-05-22 DIAGNOSIS — I10 Essential (primary) hypertension: Secondary | ICD-10-CM | POA: Diagnosis not present

## 2022-05-22 DIAGNOSIS — F0393 Unspecified dementia, unspecified severity, with mood disturbance: Secondary | ICD-10-CM | POA: Diagnosis not present

## 2022-05-22 DIAGNOSIS — Z7984 Long term (current) use of oral hypoglycemic drugs: Secondary | ICD-10-CM | POA: Diagnosis not present

## 2022-05-22 DIAGNOSIS — G894 Chronic pain syndrome: Secondary | ICD-10-CM | POA: Diagnosis not present

## 2022-05-22 DIAGNOSIS — M109 Gout, unspecified: Secondary | ICD-10-CM | POA: Diagnosis not present

## 2022-05-22 DIAGNOSIS — Z9181 History of falling: Secondary | ICD-10-CM | POA: Diagnosis not present

## 2022-05-22 DIAGNOSIS — Z955 Presence of coronary angioplasty implant and graft: Secondary | ICD-10-CM | POA: Diagnosis not present

## 2022-05-22 DIAGNOSIS — Z7902 Long term (current) use of antithrombotics/antiplatelets: Secondary | ICD-10-CM | POA: Diagnosis not present

## 2022-05-22 DIAGNOSIS — Z8673 Personal history of transient ischemic attack (TIA), and cerebral infarction without residual deficits: Secondary | ICD-10-CM | POA: Diagnosis not present

## 2022-05-22 DIAGNOSIS — E785 Hyperlipidemia, unspecified: Secondary | ICD-10-CM | POA: Diagnosis not present

## 2022-05-22 DIAGNOSIS — F0394 Unspecified dementia, unspecified severity, with anxiety: Secondary | ICD-10-CM | POA: Diagnosis not present

## 2022-05-22 DIAGNOSIS — Z7982 Long term (current) use of aspirin: Secondary | ICD-10-CM | POA: Diagnosis not present

## 2022-05-22 DIAGNOSIS — E119 Type 2 diabetes mellitus without complications: Secondary | ICD-10-CM | POA: Diagnosis not present

## 2022-05-23 DIAGNOSIS — Z7984 Long term (current) use of oral hypoglycemic drugs: Secondary | ICD-10-CM | POA: Diagnosis not present

## 2022-05-23 DIAGNOSIS — Z7982 Long term (current) use of aspirin: Secondary | ICD-10-CM | POA: Diagnosis not present

## 2022-05-23 DIAGNOSIS — E785 Hyperlipidemia, unspecified: Secondary | ICD-10-CM | POA: Diagnosis not present

## 2022-05-23 DIAGNOSIS — F0393 Unspecified dementia, unspecified severity, with mood disturbance: Secondary | ICD-10-CM | POA: Diagnosis not present

## 2022-05-23 DIAGNOSIS — Z7902 Long term (current) use of antithrombotics/antiplatelets: Secondary | ICD-10-CM | POA: Diagnosis not present

## 2022-05-23 DIAGNOSIS — Z955 Presence of coronary angioplasty implant and graft: Secondary | ICD-10-CM | POA: Diagnosis not present

## 2022-05-23 DIAGNOSIS — F0394 Unspecified dementia, unspecified severity, with anxiety: Secondary | ICD-10-CM | POA: Diagnosis not present

## 2022-05-23 DIAGNOSIS — Z9181 History of falling: Secondary | ICD-10-CM | POA: Diagnosis not present

## 2022-05-23 DIAGNOSIS — I10 Essential (primary) hypertension: Secondary | ICD-10-CM | POA: Diagnosis not present

## 2022-05-23 DIAGNOSIS — M109 Gout, unspecified: Secondary | ICD-10-CM | POA: Diagnosis not present

## 2022-05-23 DIAGNOSIS — E119 Type 2 diabetes mellitus without complications: Secondary | ICD-10-CM | POA: Diagnosis not present

## 2022-05-23 DIAGNOSIS — Z8673 Personal history of transient ischemic attack (TIA), and cerebral infarction without residual deficits: Secondary | ICD-10-CM | POA: Diagnosis not present

## 2022-05-23 DIAGNOSIS — G894 Chronic pain syndrome: Secondary | ICD-10-CM | POA: Diagnosis not present

## 2022-05-28 DIAGNOSIS — R262 Difficulty in walking, not elsewhere classified: Secondary | ICD-10-CM | POA: Diagnosis not present

## 2022-05-28 DIAGNOSIS — G309 Alzheimer's disease, unspecified: Secondary | ICD-10-CM | POA: Diagnosis not present

## 2022-05-28 DIAGNOSIS — E1142 Type 2 diabetes mellitus with diabetic polyneuropathy: Secondary | ICD-10-CM | POA: Diagnosis not present

## 2022-05-29 DIAGNOSIS — R262 Difficulty in walking, not elsewhere classified: Secondary | ICD-10-CM | POA: Diagnosis not present

## 2022-05-29 DIAGNOSIS — D649 Anemia, unspecified: Secondary | ICD-10-CM | POA: Diagnosis not present

## 2022-05-29 DIAGNOSIS — E039 Hypothyroidism, unspecified: Secondary | ICD-10-CM | POA: Diagnosis not present

## 2022-05-29 DIAGNOSIS — E785 Hyperlipidemia, unspecified: Secondary | ICD-10-CM | POA: Diagnosis not present

## 2022-05-29 DIAGNOSIS — E559 Vitamin D deficiency, unspecified: Secondary | ICD-10-CM | POA: Diagnosis not present

## 2022-05-29 DIAGNOSIS — Z79899 Other long term (current) drug therapy: Secondary | ICD-10-CM | POA: Diagnosis not present

## 2022-05-29 DIAGNOSIS — G309 Alzheimer's disease, unspecified: Secondary | ICD-10-CM | POA: Diagnosis not present

## 2022-05-29 DIAGNOSIS — E1142 Type 2 diabetes mellitus with diabetic polyneuropathy: Secondary | ICD-10-CM | POA: Diagnosis not present

## 2022-05-30 DIAGNOSIS — I10 Essential (primary) hypertension: Secondary | ICD-10-CM | POA: Diagnosis not present

## 2022-05-30 DIAGNOSIS — I2510 Atherosclerotic heart disease of native coronary artery without angina pectoris: Secondary | ICD-10-CM | POA: Diagnosis not present

## 2022-05-30 DIAGNOSIS — R262 Difficulty in walking, not elsewhere classified: Secondary | ICD-10-CM | POA: Diagnosis not present

## 2022-05-30 DIAGNOSIS — G309 Alzheimer's disease, unspecified: Secondary | ICD-10-CM | POA: Diagnosis not present

## 2022-05-30 DIAGNOSIS — E119 Type 2 diabetes mellitus without complications: Secondary | ICD-10-CM | POA: Diagnosis not present

## 2022-05-30 DIAGNOSIS — E1142 Type 2 diabetes mellitus with diabetic polyneuropathy: Secondary | ICD-10-CM | POA: Diagnosis not present

## 2022-05-31 DIAGNOSIS — G309 Alzheimer's disease, unspecified: Secondary | ICD-10-CM | POA: Diagnosis not present

## 2022-05-31 DIAGNOSIS — I5032 Chronic diastolic (congestive) heart failure: Secondary | ICD-10-CM | POA: Diagnosis not present

## 2022-05-31 DIAGNOSIS — R262 Difficulty in walking, not elsewhere classified: Secondary | ICD-10-CM | POA: Diagnosis not present

## 2022-05-31 DIAGNOSIS — F02B18 Dementia in other diseases classified elsewhere, moderate, with other behavioral disturbance: Secondary | ICD-10-CM | POA: Diagnosis not present

## 2022-05-31 DIAGNOSIS — E1142 Type 2 diabetes mellitus with diabetic polyneuropathy: Secondary | ICD-10-CM | POA: Diagnosis not present

## 2022-05-31 DIAGNOSIS — E785 Hyperlipidemia, unspecified: Secondary | ICD-10-CM | POA: Diagnosis not present

## 2022-06-01 DIAGNOSIS — R262 Difficulty in walking, not elsewhere classified: Secondary | ICD-10-CM | POA: Diagnosis not present

## 2022-06-01 DIAGNOSIS — G309 Alzheimer's disease, unspecified: Secondary | ICD-10-CM | POA: Diagnosis not present

## 2022-06-01 DIAGNOSIS — E1142 Type 2 diabetes mellitus with diabetic polyneuropathy: Secondary | ICD-10-CM | POA: Diagnosis not present

## 2022-06-02 DIAGNOSIS — G309 Alzheimer's disease, unspecified: Secondary | ICD-10-CM | POA: Diagnosis not present

## 2022-06-02 DIAGNOSIS — R262 Difficulty in walking, not elsewhere classified: Secondary | ICD-10-CM | POA: Diagnosis not present

## 2022-06-02 DIAGNOSIS — E1142 Type 2 diabetes mellitus with diabetic polyneuropathy: Secondary | ICD-10-CM | POA: Diagnosis not present

## 2022-06-04 DIAGNOSIS — E1142 Type 2 diabetes mellitus with diabetic polyneuropathy: Secondary | ICD-10-CM | POA: Diagnosis not present

## 2022-06-04 DIAGNOSIS — R262 Difficulty in walking, not elsewhere classified: Secondary | ICD-10-CM | POA: Diagnosis not present

## 2022-06-04 DIAGNOSIS — G309 Alzheimer's disease, unspecified: Secondary | ICD-10-CM | POA: Diagnosis not present

## 2022-06-05 DIAGNOSIS — G309 Alzheimer's disease, unspecified: Secondary | ICD-10-CM | POA: Diagnosis not present

## 2022-06-05 DIAGNOSIS — R262 Difficulty in walking, not elsewhere classified: Secondary | ICD-10-CM | POA: Diagnosis not present

## 2022-06-05 DIAGNOSIS — E1142 Type 2 diabetes mellitus with diabetic polyneuropathy: Secondary | ICD-10-CM | POA: Diagnosis not present

## 2022-06-06 DIAGNOSIS — E1142 Type 2 diabetes mellitus with diabetic polyneuropathy: Secondary | ICD-10-CM | POA: Diagnosis not present

## 2022-06-06 DIAGNOSIS — R262 Difficulty in walking, not elsewhere classified: Secondary | ICD-10-CM | POA: Diagnosis not present

## 2022-06-06 DIAGNOSIS — G309 Alzheimer's disease, unspecified: Secondary | ICD-10-CM | POA: Diagnosis not present

## 2022-06-07 DIAGNOSIS — N183 Chronic kidney disease, stage 3 unspecified: Secondary | ICD-10-CM | POA: Diagnosis not present

## 2022-06-07 DIAGNOSIS — E1142 Type 2 diabetes mellitus with diabetic polyneuropathy: Secondary | ICD-10-CM | POA: Diagnosis not present

## 2022-06-07 DIAGNOSIS — M19011 Primary osteoarthritis, right shoulder: Secondary | ICD-10-CM | POA: Diagnosis not present

## 2022-06-07 DIAGNOSIS — M62838 Other muscle spasm: Secondary | ICD-10-CM | POA: Diagnosis not present

## 2022-06-07 DIAGNOSIS — M25511 Pain in right shoulder: Secondary | ICD-10-CM | POA: Diagnosis not present

## 2022-06-07 DIAGNOSIS — R52 Pain, unspecified: Secondary | ICD-10-CM | POA: Diagnosis not present

## 2022-06-07 DIAGNOSIS — Z79899 Other long term (current) drug therapy: Secondary | ICD-10-CM | POA: Diagnosis not present

## 2022-06-07 DIAGNOSIS — R262 Difficulty in walking, not elsewhere classified: Secondary | ICD-10-CM | POA: Diagnosis not present

## 2022-06-07 DIAGNOSIS — G309 Alzheimer's disease, unspecified: Secondary | ICD-10-CM | POA: Diagnosis not present

## 2022-06-08 DIAGNOSIS — E1142 Type 2 diabetes mellitus with diabetic polyneuropathy: Secondary | ICD-10-CM | POA: Diagnosis not present

## 2022-06-08 DIAGNOSIS — R262 Difficulty in walking, not elsewhere classified: Secondary | ICD-10-CM | POA: Diagnosis not present

## 2022-06-08 DIAGNOSIS — G309 Alzheimer's disease, unspecified: Secondary | ICD-10-CM | POA: Diagnosis not present

## 2022-06-11 DIAGNOSIS — E1142 Type 2 diabetes mellitus with diabetic polyneuropathy: Secondary | ICD-10-CM | POA: Diagnosis not present

## 2022-06-11 DIAGNOSIS — R262 Difficulty in walking, not elsewhere classified: Secondary | ICD-10-CM | POA: Diagnosis not present

## 2022-06-11 DIAGNOSIS — I10 Essential (primary) hypertension: Secondary | ICD-10-CM | POA: Diagnosis not present

## 2022-06-11 DIAGNOSIS — G309 Alzheimer's disease, unspecified: Secondary | ICD-10-CM | POA: Diagnosis not present

## 2022-06-11 DIAGNOSIS — F02B18 Dementia in other diseases classified elsewhere, moderate, with other behavioral disturbance: Secondary | ICD-10-CM | POA: Diagnosis not present

## 2022-06-11 DIAGNOSIS — E119 Type 2 diabetes mellitus without complications: Secondary | ICD-10-CM | POA: Diagnosis not present

## 2022-06-12 DIAGNOSIS — G309 Alzheimer's disease, unspecified: Secondary | ICD-10-CM | POA: Diagnosis not present

## 2022-06-12 DIAGNOSIS — E1142 Type 2 diabetes mellitus with diabetic polyneuropathy: Secondary | ICD-10-CM | POA: Diagnosis not present

## 2022-06-12 DIAGNOSIS — R262 Difficulty in walking, not elsewhere classified: Secondary | ICD-10-CM | POA: Diagnosis not present

## 2022-06-13 DIAGNOSIS — E1142 Type 2 diabetes mellitus with diabetic polyneuropathy: Secondary | ICD-10-CM | POA: Diagnosis not present

## 2022-06-13 DIAGNOSIS — R262 Difficulty in walking, not elsewhere classified: Secondary | ICD-10-CM | POA: Diagnosis not present

## 2022-06-13 DIAGNOSIS — G309 Alzheimer's disease, unspecified: Secondary | ICD-10-CM | POA: Diagnosis not present

## 2022-06-14 DIAGNOSIS — R262 Difficulty in walking, not elsewhere classified: Secondary | ICD-10-CM | POA: Diagnosis not present

## 2022-06-14 DIAGNOSIS — E1142 Type 2 diabetes mellitus with diabetic polyneuropathy: Secondary | ICD-10-CM | POA: Diagnosis not present

## 2022-06-14 DIAGNOSIS — G309 Alzheimer's disease, unspecified: Secondary | ICD-10-CM | POA: Diagnosis not present

## 2022-06-16 DIAGNOSIS — E1142 Type 2 diabetes mellitus with diabetic polyneuropathy: Secondary | ICD-10-CM | POA: Diagnosis not present

## 2022-06-16 DIAGNOSIS — G309 Alzheimer's disease, unspecified: Secondary | ICD-10-CM | POA: Diagnosis not present

## 2022-06-16 DIAGNOSIS — R262 Difficulty in walking, not elsewhere classified: Secondary | ICD-10-CM | POA: Diagnosis not present

## 2022-06-17 DIAGNOSIS — R262 Difficulty in walking, not elsewhere classified: Secondary | ICD-10-CM | POA: Diagnosis not present

## 2022-06-17 DIAGNOSIS — E1142 Type 2 diabetes mellitus with diabetic polyneuropathy: Secondary | ICD-10-CM | POA: Diagnosis not present

## 2022-06-17 DIAGNOSIS — G309 Alzheimer's disease, unspecified: Secondary | ICD-10-CM | POA: Diagnosis not present

## 2022-06-18 DIAGNOSIS — Z79899 Other long term (current) drug therapy: Secondary | ICD-10-CM | POA: Diagnosis not present

## 2022-06-18 DIAGNOSIS — N3289 Other specified disorders of bladder: Secondary | ICD-10-CM | POA: Diagnosis not present

## 2022-06-19 DIAGNOSIS — E1142 Type 2 diabetes mellitus with diabetic polyneuropathy: Secondary | ICD-10-CM | POA: Diagnosis not present

## 2022-06-19 DIAGNOSIS — R262 Difficulty in walking, not elsewhere classified: Secondary | ICD-10-CM | POA: Diagnosis not present

## 2022-06-19 DIAGNOSIS — G309 Alzheimer's disease, unspecified: Secondary | ICD-10-CM | POA: Diagnosis not present

## 2022-06-20 DIAGNOSIS — G309 Alzheimer's disease, unspecified: Secondary | ICD-10-CM | POA: Diagnosis not present

## 2022-06-20 DIAGNOSIS — E1142 Type 2 diabetes mellitus with diabetic polyneuropathy: Secondary | ICD-10-CM | POA: Diagnosis not present

## 2022-06-20 DIAGNOSIS — R262 Difficulty in walking, not elsewhere classified: Secondary | ICD-10-CM | POA: Diagnosis not present

## 2022-06-21 DIAGNOSIS — R262 Difficulty in walking, not elsewhere classified: Secondary | ICD-10-CM | POA: Diagnosis not present

## 2022-06-21 DIAGNOSIS — E1142 Type 2 diabetes mellitus with diabetic polyneuropathy: Secondary | ICD-10-CM | POA: Diagnosis not present

## 2022-06-21 DIAGNOSIS — G309 Alzheimer's disease, unspecified: Secondary | ICD-10-CM | POA: Diagnosis not present

## 2022-06-25 DIAGNOSIS — R262 Difficulty in walking, not elsewhere classified: Secondary | ICD-10-CM | POA: Diagnosis not present

## 2022-06-25 DIAGNOSIS — G309 Alzheimer's disease, unspecified: Secondary | ICD-10-CM | POA: Diagnosis not present

## 2022-06-25 DIAGNOSIS — E1142 Type 2 diabetes mellitus with diabetic polyneuropathy: Secondary | ICD-10-CM | POA: Diagnosis not present

## 2022-06-25 DIAGNOSIS — Z79899 Other long term (current) drug therapy: Secondary | ICD-10-CM | POA: Diagnosis not present

## 2022-06-26 DIAGNOSIS — Z79899 Other long term (current) drug therapy: Secondary | ICD-10-CM | POA: Diagnosis not present

## 2022-06-26 DIAGNOSIS — E1142 Type 2 diabetes mellitus with diabetic polyneuropathy: Secondary | ICD-10-CM | POA: Diagnosis not present

## 2022-06-26 DIAGNOSIS — R262 Difficulty in walking, not elsewhere classified: Secondary | ICD-10-CM | POA: Diagnosis not present

## 2022-06-26 DIAGNOSIS — G309 Alzheimer's disease, unspecified: Secondary | ICD-10-CM | POA: Diagnosis not present

## 2022-06-27 DIAGNOSIS — I2510 Atherosclerotic heart disease of native coronary artery without angina pectoris: Secondary | ICD-10-CM | POA: Diagnosis not present

## 2022-06-27 DIAGNOSIS — E119 Type 2 diabetes mellitus without complications: Secondary | ICD-10-CM | POA: Diagnosis not present

## 2022-06-27 DIAGNOSIS — I10 Essential (primary) hypertension: Secondary | ICD-10-CM | POA: Diagnosis not present

## 2022-06-27 DIAGNOSIS — G309 Alzheimer's disease, unspecified: Secondary | ICD-10-CM | POA: Diagnosis not present

## 2022-06-27 DIAGNOSIS — E1142 Type 2 diabetes mellitus with diabetic polyneuropathy: Secondary | ICD-10-CM | POA: Diagnosis not present

## 2022-06-27 DIAGNOSIS — R262 Difficulty in walking, not elsewhere classified: Secondary | ICD-10-CM | POA: Diagnosis not present

## 2022-06-28 DIAGNOSIS — G309 Alzheimer's disease, unspecified: Secondary | ICD-10-CM | POA: Diagnosis not present

## 2022-06-28 DIAGNOSIS — R262 Difficulty in walking, not elsewhere classified: Secondary | ICD-10-CM | POA: Diagnosis not present

## 2022-06-28 DIAGNOSIS — E1142 Type 2 diabetes mellitus with diabetic polyneuropathy: Secondary | ICD-10-CM | POA: Diagnosis not present

## 2022-06-29 DIAGNOSIS — G309 Alzheimer's disease, unspecified: Secondary | ICD-10-CM | POA: Diagnosis not present

## 2022-06-29 DIAGNOSIS — E1142 Type 2 diabetes mellitus with diabetic polyneuropathy: Secondary | ICD-10-CM | POA: Diagnosis not present

## 2022-06-29 DIAGNOSIS — R262 Difficulty in walking, not elsewhere classified: Secondary | ICD-10-CM | POA: Diagnosis not present

## 2022-07-17 DIAGNOSIS — E11649 Type 2 diabetes mellitus with hypoglycemia without coma: Secondary | ICD-10-CM | POA: Diagnosis not present

## 2022-07-17 DIAGNOSIS — Z79899 Other long term (current) drug therapy: Secondary | ICD-10-CM | POA: Diagnosis not present

## 2022-07-25 DIAGNOSIS — R1319 Other dysphagia: Secondary | ICD-10-CM | POA: Diagnosis not present

## 2022-07-25 DIAGNOSIS — E785 Hyperlipidemia, unspecified: Secondary | ICD-10-CM | POA: Diagnosis not present

## 2022-07-25 DIAGNOSIS — E11649 Type 2 diabetes mellitus with hypoglycemia without coma: Secondary | ICD-10-CM | POA: Diagnosis not present

## 2022-07-25 DIAGNOSIS — I10 Essential (primary) hypertension: Secondary | ICD-10-CM | POA: Diagnosis not present

## 2022-07-25 DIAGNOSIS — G309 Alzheimer's disease, unspecified: Secondary | ICD-10-CM | POA: Diagnosis not present

## 2022-07-30 DIAGNOSIS — M6281 Muscle weakness (generalized): Secondary | ICD-10-CM | POA: Diagnosis not present

## 2022-07-30 DIAGNOSIS — G309 Alzheimer's disease, unspecified: Secondary | ICD-10-CM | POA: Diagnosis not present

## 2022-07-31 DIAGNOSIS — M6281 Muscle weakness (generalized): Secondary | ICD-10-CM | POA: Diagnosis not present

## 2022-07-31 DIAGNOSIS — G309 Alzheimer's disease, unspecified: Secondary | ICD-10-CM | POA: Diagnosis not present

## 2022-08-01 DIAGNOSIS — M6281 Muscle weakness (generalized): Secondary | ICD-10-CM | POA: Diagnosis not present

## 2022-08-01 DIAGNOSIS — G309 Alzheimer's disease, unspecified: Secondary | ICD-10-CM | POA: Diagnosis not present

## 2022-08-02 DIAGNOSIS — G309 Alzheimer's disease, unspecified: Secondary | ICD-10-CM | POA: Diagnosis not present

## 2022-08-02 DIAGNOSIS — M6281 Muscle weakness (generalized): Secondary | ICD-10-CM | POA: Diagnosis not present

## 2022-08-03 DIAGNOSIS — G309 Alzheimer's disease, unspecified: Secondary | ICD-10-CM | POA: Diagnosis not present

## 2022-08-03 DIAGNOSIS — M6281 Muscle weakness (generalized): Secondary | ICD-10-CM | POA: Diagnosis not present

## 2022-08-05 DIAGNOSIS — G309 Alzheimer's disease, unspecified: Secondary | ICD-10-CM | POA: Diagnosis not present

## 2022-08-05 DIAGNOSIS — M6281 Muscle weakness (generalized): Secondary | ICD-10-CM | POA: Diagnosis not present

## 2022-08-06 DIAGNOSIS — M6281 Muscle weakness (generalized): Secondary | ICD-10-CM | POA: Diagnosis not present

## 2022-08-06 DIAGNOSIS — G309 Alzheimer's disease, unspecified: Secondary | ICD-10-CM | POA: Diagnosis not present

## 2022-08-07 DIAGNOSIS — E11649 Type 2 diabetes mellitus with hypoglycemia without coma: Secondary | ICD-10-CM | POA: Diagnosis not present

## 2022-08-07 DIAGNOSIS — Z79899 Other long term (current) drug therapy: Secondary | ICD-10-CM | POA: Diagnosis not present

## 2022-08-07 DIAGNOSIS — I10 Essential (primary) hypertension: Secondary | ICD-10-CM | POA: Diagnosis not present

## 2022-08-08 DIAGNOSIS — M6281 Muscle weakness (generalized): Secondary | ICD-10-CM | POA: Diagnosis not present

## 2022-08-08 DIAGNOSIS — G309 Alzheimer's disease, unspecified: Secondary | ICD-10-CM | POA: Diagnosis not present

## 2022-08-09 ENCOUNTER — Ambulatory Visit: Payer: Self-pay | Admitting: *Deleted

## 2022-08-09 DIAGNOSIS — M6281 Muscle weakness (generalized): Secondary | ICD-10-CM | POA: Diagnosis not present

## 2022-08-09 DIAGNOSIS — G309 Alzheimer's disease, unspecified: Secondary | ICD-10-CM | POA: Diagnosis not present

## 2022-08-09 NOTE — Chronic Care Management (AMB) (Signed)
  Chronic Care Management   Note  08/09/2022 Name: Katelyn Ballard MRN: [id number] DOB: January 10, 1945   Due to changes in the Chronic Care Management program, I am removing myself as the RN Care Manager from the Care Team and closing any RN Care Management Care Plans. The patient has not worked with the Medical illustrator within the past 6 months. Patient was not scheduled to be followed by the RN Care Coordination nurse for [facility].   Patient does not have an open Care Plan with another CCM team member. Patient does not have a current CCM referral placed since 04/30/22. CCM enrollment status changed to "not enrolled".   Patient's PCP can place a new referral if the they needs Care Management or Care Coordination services in the future.  Demetrios Loll, BSN, RN-BC Engineer, materials Dial: [phone]

## 2022-08-09 NOTE — Patient Instructions (Signed)
Katelyn Katelyn  At some point during the past 4 years, I have worked with you through the Chronic Care Management Program (CCM) at [facility]. We have not worked together within the past 6 months.   Due to program changes I am removing myself from your care team.   If you are currently active with another CCM Team Member, you will remain active with them unless they reach out to you with additional information.   If you feel that you need services in the future,  please talk with your primary care provider and request a new referral for Care Management or Care Coordination services. This does not affect your status as a patient at [facility] Medicine.   Thank you for allowing me to participate in your your healthcare journey.  Demetrios Loll, BSN, RN-BC Engineer, materials Dial: [phone]

## 2022-08-10 DIAGNOSIS — M6281 Muscle weakness (generalized): Secondary | ICD-10-CM | POA: Diagnosis not present

## 2022-08-10 DIAGNOSIS — G309 Alzheimer's disease, unspecified: Secondary | ICD-10-CM | POA: Diagnosis not present

## 2022-08-13 DIAGNOSIS — G309 Alzheimer's disease, unspecified: Secondary | ICD-10-CM | POA: Diagnosis not present

## 2022-08-13 DIAGNOSIS — M6281 Muscle weakness (generalized): Secondary | ICD-10-CM | POA: Diagnosis not present

## 2022-08-14 DIAGNOSIS — I10 Essential (primary) hypertension: Secondary | ICD-10-CM | POA: Diagnosis not present

## 2022-08-14 DIAGNOSIS — R001 Bradycardia, unspecified: Secondary | ICD-10-CM | POA: Diagnosis not present

## 2022-08-14 DIAGNOSIS — Z79899 Other long term (current) drug therapy: Secondary | ICD-10-CM | POA: Diagnosis not present

## 2022-08-14 DIAGNOSIS — M6281 Muscle weakness (generalized): Secondary | ICD-10-CM | POA: Diagnosis not present

## 2022-08-14 DIAGNOSIS — G309 Alzheimer's disease, unspecified: Secondary | ICD-10-CM | POA: Diagnosis not present

## 2022-08-15 DIAGNOSIS — G309 Alzheimer's disease, unspecified: Secondary | ICD-10-CM | POA: Diagnosis not present

## 2022-08-15 DIAGNOSIS — M6281 Muscle weakness (generalized): Secondary | ICD-10-CM | POA: Diagnosis not present

## 2022-08-30 DIAGNOSIS — K5900 Constipation, unspecified: Secondary | ICD-10-CM | POA: Diagnosis not present

## 2022-08-30 DIAGNOSIS — Z79899 Other long term (current) drug therapy: Secondary | ICD-10-CM | POA: Diagnosis not present

## 2022-10-08 DIAGNOSIS — Z79899 Other long term (current) drug therapy: Secondary | ICD-10-CM | POA: Diagnosis not present

## 2022-10-08 DIAGNOSIS — I10 Essential (primary) hypertension: Secondary | ICD-10-CM | POA: Diagnosis not present

## 2022-10-08 DIAGNOSIS — R058 Other specified cough: Secondary | ICD-10-CM | POA: Diagnosis not present

## 2022-10-08 DIAGNOSIS — I2510 Atherosclerotic heart disease of native coronary artery without angina pectoris: Secondary | ICD-10-CM | POA: Diagnosis not present

## 2022-10-08 DIAGNOSIS — G309 Alzheimer's disease, unspecified: Secondary | ICD-10-CM | POA: Diagnosis not present

## 2022-10-08 DIAGNOSIS — I340 Nonrheumatic mitral (valve) insufficiency: Secondary | ICD-10-CM | POA: Diagnosis not present

## 2022-10-08 DIAGNOSIS — R296 Repeated falls: Secondary | ICD-10-CM | POA: Diagnosis not present

## 2022-10-08 DIAGNOSIS — E785 Hyperlipidemia, unspecified: Secondary | ICD-10-CM | POA: Diagnosis not present

## 2022-10-08 DIAGNOSIS — R0989 Other specified symptoms and signs involving the circulatory and respiratory systems: Secondary | ICD-10-CM | POA: Diagnosis not present

## 2022-10-08 DIAGNOSIS — E1169 Type 2 diabetes mellitus with other specified complication: Secondary | ICD-10-CM | POA: Diagnosis not present

## 2022-10-08 DIAGNOSIS — Z23 Encounter for immunization: Secondary | ICD-10-CM | POA: Diagnosis not present

## 2022-10-08 DIAGNOSIS — R059 Cough, unspecified: Secondary | ICD-10-CM | POA: Diagnosis not present

## 2022-10-11 DIAGNOSIS — R062 Wheezing: Secondary | ICD-10-CM | POA: Diagnosis not present

## 2022-10-11 DIAGNOSIS — R0989 Other specified symptoms and signs involving the circulatory and respiratory systems: Secondary | ICD-10-CM | POA: Diagnosis not present

## 2022-10-11 DIAGNOSIS — Z79899 Other long term (current) drug therapy: Secondary | ICD-10-CM | POA: Diagnosis not present

## 2022-10-17 DIAGNOSIS — I10 Essential (primary) hypertension: Secondary | ICD-10-CM | POA: Diagnosis not present

## 2022-10-17 DIAGNOSIS — E559 Vitamin D deficiency, unspecified: Secondary | ICD-10-CM | POA: Diagnosis not present

## 2022-10-17 DIAGNOSIS — E11649 Type 2 diabetes mellitus with hypoglycemia without coma: Secondary | ICD-10-CM | POA: Diagnosis not present

## 2022-10-17 DIAGNOSIS — E785 Hyperlipidemia, unspecified: Secondary | ICD-10-CM | POA: Diagnosis not present

## 2022-10-17 DIAGNOSIS — G309 Alzheimer's disease, unspecified: Secondary | ICD-10-CM | POA: Diagnosis not present

## 2022-10-23 DIAGNOSIS — R296 Repeated falls: Secondary | ICD-10-CM | POA: Diagnosis not present

## 2022-10-23 DIAGNOSIS — G309 Alzheimer's disease, unspecified: Secondary | ICD-10-CM | POA: Diagnosis not present

## 2022-10-23 DIAGNOSIS — Z23 Encounter for immunization: Secondary | ICD-10-CM | POA: Diagnosis not present

## 2022-10-24 DIAGNOSIS — Z23 Encounter for immunization: Secondary | ICD-10-CM | POA: Diagnosis not present

## 2022-10-24 DIAGNOSIS — R296 Repeated falls: Secondary | ICD-10-CM | POA: Diagnosis not present

## 2022-10-24 DIAGNOSIS — G309 Alzheimer's disease, unspecified: Secondary | ICD-10-CM | POA: Diagnosis not present

## 2022-10-25 DIAGNOSIS — Z23 Encounter for immunization: Secondary | ICD-10-CM | POA: Diagnosis not present

## 2022-10-25 DIAGNOSIS — R296 Repeated falls: Secondary | ICD-10-CM | POA: Diagnosis not present

## 2022-10-25 DIAGNOSIS — G309 Alzheimer's disease, unspecified: Secondary | ICD-10-CM | POA: Diagnosis not present

## 2022-10-26 DIAGNOSIS — R296 Repeated falls: Secondary | ICD-10-CM | POA: Diagnosis not present

## 2022-10-26 DIAGNOSIS — Z23 Encounter for immunization: Secondary | ICD-10-CM | POA: Diagnosis not present

## 2022-10-26 DIAGNOSIS — G309 Alzheimer's disease, unspecified: Secondary | ICD-10-CM | POA: Diagnosis not present

## 2022-10-30 DIAGNOSIS — Z23 Encounter for immunization: Secondary | ICD-10-CM | POA: Diagnosis not present

## 2022-10-30 DIAGNOSIS — G309 Alzheimer's disease, unspecified: Secondary | ICD-10-CM | POA: Diagnosis not present

## 2022-10-30 DIAGNOSIS — R296 Repeated falls: Secondary | ICD-10-CM | POA: Diagnosis not present

## 2022-10-31 DIAGNOSIS — R296 Repeated falls: Secondary | ICD-10-CM | POA: Diagnosis not present

## 2022-10-31 DIAGNOSIS — G309 Alzheimer's disease, unspecified: Secondary | ICD-10-CM | POA: Diagnosis not present

## 2022-11-01 DIAGNOSIS — R296 Repeated falls: Secondary | ICD-10-CM | POA: Diagnosis not present

## 2022-11-01 DIAGNOSIS — G309 Alzheimer's disease, unspecified: Secondary | ICD-10-CM | POA: Diagnosis not present

## 2022-11-02 DIAGNOSIS — R296 Repeated falls: Secondary | ICD-10-CM | POA: Diagnosis not present

## 2022-11-02 DIAGNOSIS — G309 Alzheimer's disease, unspecified: Secondary | ICD-10-CM | POA: Diagnosis not present

## 2022-11-03 DIAGNOSIS — R296 Repeated falls: Secondary | ICD-10-CM | POA: Diagnosis not present

## 2022-11-03 DIAGNOSIS — G309 Alzheimer's disease, unspecified: Secondary | ICD-10-CM | POA: Diagnosis not present

## 2022-11-05 DIAGNOSIS — G309 Alzheimer's disease, unspecified: Secondary | ICD-10-CM | POA: Diagnosis not present

## 2022-11-05 DIAGNOSIS — R296 Repeated falls: Secondary | ICD-10-CM | POA: Diagnosis not present

## 2022-11-06 DIAGNOSIS — G309 Alzheimer's disease, unspecified: Secondary | ICD-10-CM | POA: Diagnosis not present

## 2022-11-06 DIAGNOSIS — R296 Repeated falls: Secondary | ICD-10-CM | POA: Diagnosis not present

## 2022-11-07 DIAGNOSIS — G309 Alzheimer's disease, unspecified: Secondary | ICD-10-CM | POA: Diagnosis not present

## 2022-11-07 DIAGNOSIS — R296 Repeated falls: Secondary | ICD-10-CM | POA: Diagnosis not present

## 2022-11-08 DIAGNOSIS — G309 Alzheimer's disease, unspecified: Secondary | ICD-10-CM | POA: Diagnosis not present

## 2022-11-08 DIAGNOSIS — R296 Repeated falls: Secondary | ICD-10-CM | POA: Diagnosis not present

## 2022-11-09 DIAGNOSIS — R296 Repeated falls: Secondary | ICD-10-CM | POA: Diagnosis not present

## 2022-11-09 DIAGNOSIS — G309 Alzheimer's disease, unspecified: Secondary | ICD-10-CM | POA: Diagnosis not present

## 2022-11-12 DIAGNOSIS — R296 Repeated falls: Secondary | ICD-10-CM | POA: Diagnosis not present

## 2022-11-12 DIAGNOSIS — G309 Alzheimer's disease, unspecified: Secondary | ICD-10-CM | POA: Diagnosis not present

## 2022-11-13 DIAGNOSIS — U071 COVID-19: Secondary | ICD-10-CM | POA: Diagnosis not present

## 2022-11-13 DIAGNOSIS — Z79899 Other long term (current) drug therapy: Secondary | ICD-10-CM | POA: Diagnosis not present

## 2022-11-13 DIAGNOSIS — G309 Alzheimer's disease, unspecified: Secondary | ICD-10-CM | POA: Diagnosis not present

## 2022-11-13 DIAGNOSIS — R296 Repeated falls: Secondary | ICD-10-CM | POA: Diagnosis not present

## 2022-11-14 DIAGNOSIS — R296 Repeated falls: Secondary | ICD-10-CM | POA: Diagnosis not present

## 2022-11-14 DIAGNOSIS — G309 Alzheimer's disease, unspecified: Secondary | ICD-10-CM | POA: Diagnosis not present

## 2022-11-15 DIAGNOSIS — G309 Alzheimer's disease, unspecified: Secondary | ICD-10-CM | POA: Diagnosis not present

## 2022-11-15 DIAGNOSIS — R296 Repeated falls: Secondary | ICD-10-CM | POA: Diagnosis not present

## 2022-11-16 DIAGNOSIS — G309 Alzheimer's disease, unspecified: Secondary | ICD-10-CM | POA: Diagnosis not present

## 2022-11-16 DIAGNOSIS — D649 Anemia, unspecified: Secondary | ICD-10-CM | POA: Diagnosis not present

## 2022-11-16 DIAGNOSIS — E1122 Type 2 diabetes mellitus with diabetic chronic kidney disease: Secondary | ICD-10-CM | POA: Diagnosis not present

## 2022-11-16 DIAGNOSIS — R296 Repeated falls: Secondary | ICD-10-CM | POA: Diagnosis not present

## 2022-11-19 DIAGNOSIS — Z79899 Other long term (current) drug therapy: Secondary | ICD-10-CM | POA: Diagnosis not present

## 2022-11-19 DIAGNOSIS — I10 Essential (primary) hypertension: Secondary | ICD-10-CM | POA: Diagnosis not present

## 2022-11-19 DIAGNOSIS — E785 Hyperlipidemia, unspecified: Secondary | ICD-10-CM | POA: Diagnosis not present

## 2022-11-20 DIAGNOSIS — I10 Essential (primary) hypertension: Secondary | ICD-10-CM | POA: Diagnosis not present

## 2022-11-20 DIAGNOSIS — Z79899 Other long term (current) drug therapy: Secondary | ICD-10-CM | POA: Diagnosis not present

## 2022-11-20 DIAGNOSIS — E119 Type 2 diabetes mellitus without complications: Secondary | ICD-10-CM | POA: Diagnosis not present

## 2022-11-20 DIAGNOSIS — Z794 Long term (current) use of insulin: Secondary | ICD-10-CM | POA: Diagnosis not present

## 2022-12-08 DIAGNOSIS — N390 Urinary tract infection, site not specified: Secondary | ICD-10-CM | POA: Diagnosis not present

## 2022-12-10 DIAGNOSIS — Z79899 Other long term (current) drug therapy: Secondary | ICD-10-CM | POA: Diagnosis not present

## 2022-12-11 DIAGNOSIS — Z79899 Other long term (current) drug therapy: Secondary | ICD-10-CM | POA: Diagnosis not present

## 2022-12-11 DIAGNOSIS — N390 Urinary tract infection, site not specified: Secondary | ICD-10-CM | POA: Diagnosis not present

## 2022-12-13 DIAGNOSIS — Z79899 Other long term (current) drug therapy: Secondary | ICD-10-CM | POA: Diagnosis not present

## 2022-12-13 DIAGNOSIS — N390 Urinary tract infection, site not specified: Secondary | ICD-10-CM | POA: Diagnosis not present

## 2022-12-27 DIAGNOSIS — E785 Hyperlipidemia, unspecified: Secondary | ICD-10-CM | POA: Diagnosis not present

## 2022-12-27 DIAGNOSIS — Z79899 Other long term (current) drug therapy: Secondary | ICD-10-CM | POA: Diagnosis not present

## 2023-01-07 DIAGNOSIS — N390 Urinary tract infection, site not specified: Secondary | ICD-10-CM | POA: Diagnosis not present

## 2023-01-09 DIAGNOSIS — Z79899 Other long term (current) drug therapy: Secondary | ICD-10-CM | POA: Diagnosis not present

## 2023-01-09 DIAGNOSIS — B3731 Acute candidiasis of vulva and vagina: Secondary | ICD-10-CM | POA: Diagnosis not present

## 2023-01-10 DIAGNOSIS — E785 Hyperlipidemia, unspecified: Secondary | ICD-10-CM | POA: Diagnosis not present

## 2023-01-10 DIAGNOSIS — I5032 Chronic diastolic (congestive) heart failure: Secondary | ICD-10-CM | POA: Diagnosis not present

## 2023-01-10 DIAGNOSIS — G309 Alzheimer's disease, unspecified: Secondary | ICD-10-CM | POA: Diagnosis not present

## 2023-01-10 DIAGNOSIS — I2510 Atherosclerotic heart disease of native coronary artery without angina pectoris: Secondary | ICD-10-CM | POA: Diagnosis not present

## 2023-01-11 DIAGNOSIS — B3731 Acute candidiasis of vulva and vagina: Secondary | ICD-10-CM | POA: Diagnosis not present

## 2023-01-11 DIAGNOSIS — N390 Urinary tract infection, site not specified: Secondary | ICD-10-CM | POA: Diagnosis not present

## 2023-01-16 DIAGNOSIS — M6281 Muscle weakness (generalized): Secondary | ICD-10-CM | POA: Diagnosis not present

## 2023-01-16 DIAGNOSIS — G309 Alzheimer's disease, unspecified: Secondary | ICD-10-CM | POA: Diagnosis not present

## 2023-01-17 DIAGNOSIS — M6281 Muscle weakness (generalized): Secondary | ICD-10-CM | POA: Diagnosis not present

## 2023-01-17 DIAGNOSIS — G309 Alzheimer's disease, unspecified: Secondary | ICD-10-CM | POA: Diagnosis not present

## 2023-01-18 DIAGNOSIS — G309 Alzheimer's disease, unspecified: Secondary | ICD-10-CM | POA: Diagnosis not present

## 2023-01-18 DIAGNOSIS — M6281 Muscle weakness (generalized): Secondary | ICD-10-CM | POA: Diagnosis not present

## 2023-01-19 DIAGNOSIS — M6281 Muscle weakness (generalized): Secondary | ICD-10-CM | POA: Diagnosis not present

## 2023-01-19 DIAGNOSIS — G309 Alzheimer's disease, unspecified: Secondary | ICD-10-CM | POA: Diagnosis not present

## 2023-01-20 DIAGNOSIS — G309 Alzheimer's disease, unspecified: Secondary | ICD-10-CM | POA: Diagnosis not present

## 2023-01-20 DIAGNOSIS — M6281 Muscle weakness (generalized): Secondary | ICD-10-CM | POA: Diagnosis not present

## 2023-01-21 DIAGNOSIS — G309 Alzheimer's disease, unspecified: Secondary | ICD-10-CM | POA: Diagnosis not present

## 2023-01-21 DIAGNOSIS — M6281 Muscle weakness (generalized): Secondary | ICD-10-CM | POA: Diagnosis not present

## 2023-01-22 DIAGNOSIS — M6281 Muscle weakness (generalized): Secondary | ICD-10-CM | POA: Diagnosis not present

## 2023-01-22 DIAGNOSIS — G309 Alzheimer's disease, unspecified: Secondary | ICD-10-CM | POA: Diagnosis not present

## 2023-01-23 DIAGNOSIS — G309 Alzheimer's disease, unspecified: Secondary | ICD-10-CM | POA: Diagnosis not present

## 2023-01-23 DIAGNOSIS — M6281 Muscle weakness (generalized): Secondary | ICD-10-CM | POA: Diagnosis not present

## 2023-01-23 DIAGNOSIS — Z79899 Other long term (current) drug therapy: Secondary | ICD-10-CM | POA: Diagnosis not present

## 2023-01-24 DIAGNOSIS — G309 Alzheimer's disease, unspecified: Secondary | ICD-10-CM | POA: Diagnosis not present

## 2023-01-24 DIAGNOSIS — M6281 Muscle weakness (generalized): Secondary | ICD-10-CM | POA: Diagnosis not present

## 2023-01-24 DIAGNOSIS — R059 Cough, unspecified: Secondary | ICD-10-CM | POA: Diagnosis not present

## 2023-01-24 DIAGNOSIS — R062 Wheezing: Secondary | ICD-10-CM | POA: Diagnosis not present

## 2023-01-24 DIAGNOSIS — F32A Depression, unspecified: Secondary | ICD-10-CM | POA: Diagnosis not present

## 2023-01-24 DIAGNOSIS — R0989 Other specified symptoms and signs involving the circulatory and respiratory systems: Secondary | ICD-10-CM | POA: Diagnosis not present

## 2023-01-25 DIAGNOSIS — M6281 Muscle weakness (generalized): Secondary | ICD-10-CM | POA: Diagnosis not present

## 2023-01-25 DIAGNOSIS — G309 Alzheimer's disease, unspecified: Secondary | ICD-10-CM | POA: Diagnosis not present

## 2023-01-28 DIAGNOSIS — G309 Alzheimer's disease, unspecified: Secondary | ICD-10-CM | POA: Diagnosis not present

## 2023-01-28 DIAGNOSIS — M6281 Muscle weakness (generalized): Secondary | ICD-10-CM | POA: Diagnosis not present

## 2023-01-29 DIAGNOSIS — F02B18 Dementia in other diseases classified elsewhere, moderate, with other behavioral disturbance: Secondary | ICD-10-CM | POA: Diagnosis not present

## 2023-01-29 DIAGNOSIS — Z79899 Other long term (current) drug therapy: Secondary | ICD-10-CM | POA: Diagnosis not present

## 2023-01-29 DIAGNOSIS — F03911 Unspecified dementia, unspecified severity, with agitation: Secondary | ICD-10-CM | POA: Diagnosis not present

## 2023-01-29 DIAGNOSIS — G309 Alzheimer's disease, unspecified: Secondary | ICD-10-CM | POA: Diagnosis not present

## 2023-01-29 DIAGNOSIS — M6281 Muscle weakness (generalized): Secondary | ICD-10-CM | POA: Diagnosis not present

## 2023-01-30 DIAGNOSIS — G309 Alzheimer's disease, unspecified: Secondary | ICD-10-CM | POA: Diagnosis not present

## 2023-01-30 DIAGNOSIS — M6281 Muscle weakness (generalized): Secondary | ICD-10-CM | POA: Diagnosis not present

## 2023-01-31 DIAGNOSIS — N390 Urinary tract infection, site not specified: Secondary | ICD-10-CM | POA: Diagnosis not present

## 2023-01-31 DIAGNOSIS — M6281 Muscle weakness (generalized): Secondary | ICD-10-CM | POA: Diagnosis not present

## 2023-01-31 DIAGNOSIS — G309 Alzheimer's disease, unspecified: Secondary | ICD-10-CM | POA: Diagnosis not present

## 2023-02-01 DIAGNOSIS — M6281 Muscle weakness (generalized): Secondary | ICD-10-CM | POA: Diagnosis not present

## 2023-02-01 DIAGNOSIS — G309 Alzheimer's disease, unspecified: Secondary | ICD-10-CM | POA: Diagnosis not present

## 2023-02-04 DIAGNOSIS — Z79899 Other long term (current) drug therapy: Secondary | ICD-10-CM | POA: Diagnosis not present

## 2023-02-04 DIAGNOSIS — N390 Urinary tract infection, site not specified: Secondary | ICD-10-CM | POA: Diagnosis not present

## 2023-02-07 DIAGNOSIS — J90 Pleural effusion, not elsewhere classified: Secondary | ICD-10-CM | POA: Diagnosis not present

## 2023-02-07 DIAGNOSIS — R059 Cough, unspecified: Secondary | ICD-10-CM | POA: Diagnosis not present

## 2023-02-07 DIAGNOSIS — R0989 Other specified symptoms and signs involving the circulatory and respiratory systems: Secondary | ICD-10-CM | POA: Diagnosis not present

## 2023-02-07 DIAGNOSIS — R058 Other specified cough: Secondary | ICD-10-CM | POA: Diagnosis not present

## 2023-02-08 DIAGNOSIS — Z79899 Other long term (current) drug therapy: Secondary | ICD-10-CM | POA: Diagnosis not present

## 2023-02-08 DIAGNOSIS — J90 Pleural effusion, not elsewhere classified: Secondary | ICD-10-CM | POA: Diagnosis not present

## 2023-02-25 DIAGNOSIS — Z79899 Other long term (current) drug therapy: Secondary | ICD-10-CM | POA: Diagnosis not present

## 2023-02-25 DIAGNOSIS — F32A Depression, unspecified: Secondary | ICD-10-CM | POA: Diagnosis not present

## 2023-02-26 DIAGNOSIS — Z79899 Other long term (current) drug therapy: Secondary | ICD-10-CM | POA: Diagnosis not present

## 2023-03-12 DIAGNOSIS — Z79899 Other long term (current) drug therapy: Secondary | ICD-10-CM | POA: Diagnosis not present

## 2023-03-13 ENCOUNTER — Telehealth: Payer: Self-pay | Admitting: Nurse Practitioner

## 2023-03-13 NOTE — Telephone Encounter (Signed)
Contacted Katelyn Ballard to schedule their annual wellness visit. Patient declined to schedule AWV at this time.  Patient is in a Event organiser and seeing their doctors  Thank you,  [name],  [place] ??[phone]

## 2023-03-14 DIAGNOSIS — D649 Anemia, unspecified: Secondary | ICD-10-CM | POA: Diagnosis not present

## 2023-03-14 DIAGNOSIS — Z79899 Other long term (current) drug therapy: Secondary | ICD-10-CM | POA: Diagnosis not present

## 2023-03-15 DIAGNOSIS — N390 Urinary tract infection, site not specified: Secondary | ICD-10-CM | POA: Diagnosis not present

## 2023-03-19 DIAGNOSIS — Z79899 Other long term (current) drug therapy: Secondary | ICD-10-CM | POA: Diagnosis not present

## 2023-03-19 DIAGNOSIS — N390 Urinary tract infection, site not specified: Secondary | ICD-10-CM | POA: Diagnosis not present

## 2023-03-25 DIAGNOSIS — E119 Type 2 diabetes mellitus without complications: Secondary | ICD-10-CM | POA: Diagnosis not present

## 2023-03-25 DIAGNOSIS — Z79899 Other long term (current) drug therapy: Secondary | ICD-10-CM | POA: Diagnosis not present

## 2023-03-25 DIAGNOSIS — Z794 Long term (current) use of insulin: Secondary | ICD-10-CM | POA: Diagnosis not present

## 2023-03-25 DIAGNOSIS — Z7189 Other specified counseling: Secondary | ICD-10-CM | POA: Diagnosis not present

## 2023-03-25 DIAGNOSIS — E785 Hyperlipidemia, unspecified: Secondary | ICD-10-CM | POA: Diagnosis not present

## 2023-03-25 DIAGNOSIS — I10 Essential (primary) hypertension: Secondary | ICD-10-CM | POA: Diagnosis not present

## 2023-04-03 DIAGNOSIS — Z5181 Encounter for therapeutic drug level monitoring: Secondary | ICD-10-CM | POA: Diagnosis not present

## 2023-04-03 DIAGNOSIS — R262 Difficulty in walking, not elsewhere classified: Secondary | ICD-10-CM | POA: Diagnosis not present

## 2023-04-03 DIAGNOSIS — R296 Repeated falls: Secondary | ICD-10-CM | POA: Diagnosis not present

## 2023-04-03 DIAGNOSIS — M6281 Muscle weakness (generalized): Secondary | ICD-10-CM | POA: Diagnosis not present

## 2023-04-03 DIAGNOSIS — G309 Alzheimer's disease, unspecified: Secondary | ICD-10-CM | POA: Diagnosis not present

## 2023-04-05 DIAGNOSIS — E7849 Other hyperlipidemia: Secondary | ICD-10-CM | POA: Diagnosis not present

## 2023-04-05 DIAGNOSIS — I2510 Atherosclerotic heart disease of native coronary artery without angina pectoris: Secondary | ICD-10-CM | POA: Diagnosis not present

## 2023-04-05 DIAGNOSIS — Z8673 Personal history of transient ischemic attack (TIA), and cerebral infarction without residual deficits: Secondary | ICD-10-CM | POA: Diagnosis not present

## 2023-04-05 DIAGNOSIS — I10 Essential (primary) hypertension: Secondary | ICD-10-CM | POA: Diagnosis not present

## 2023-04-17 DIAGNOSIS — I2510 Atherosclerotic heart disease of native coronary artery without angina pectoris: Secondary | ICD-10-CM | POA: Diagnosis not present

## 2023-04-17 DIAGNOSIS — E559 Vitamin D deficiency, unspecified: Secondary | ICD-10-CM | POA: Diagnosis not present

## 2023-04-17 DIAGNOSIS — E785 Hyperlipidemia, unspecified: Secondary | ICD-10-CM | POA: Diagnosis not present

## 2023-04-17 DIAGNOSIS — F32A Depression, unspecified: Secondary | ICD-10-CM | POA: Diagnosis not present

## 2023-04-17 DIAGNOSIS — I10 Essential (primary) hypertension: Secondary | ICD-10-CM | POA: Diagnosis not present

## 2023-04-17 DIAGNOSIS — I5032 Chronic diastolic (congestive) heart failure: Secondary | ICD-10-CM | POA: Diagnosis not present

## 2023-04-17 DIAGNOSIS — G309 Alzheimer's disease, unspecified: Secondary | ICD-10-CM | POA: Diagnosis not present

## 2023-04-17 DIAGNOSIS — R1319 Other dysphagia: Secondary | ICD-10-CM | POA: Diagnosis not present

## 2023-04-17 DIAGNOSIS — E119 Type 2 diabetes mellitus without complications: Secondary | ICD-10-CM | POA: Diagnosis not present

## 2023-04-17 DIAGNOSIS — M109 Gout, unspecified: Secondary | ICD-10-CM | POA: Diagnosis not present

## 2023-04-18 DIAGNOSIS — G309 Alzheimer's disease, unspecified: Secondary | ICD-10-CM | POA: Diagnosis not present

## 2023-04-18 DIAGNOSIS — R1319 Other dysphagia: Secondary | ICD-10-CM | POA: Diagnosis not present

## 2023-04-19 DIAGNOSIS — G309 Alzheimer's disease, unspecified: Secondary | ICD-10-CM | POA: Diagnosis not present

## 2023-04-19 DIAGNOSIS — R1319 Other dysphagia: Secondary | ICD-10-CM | POA: Diagnosis not present

## 2023-04-19 DIAGNOSIS — D649 Anemia, unspecified: Secondary | ICD-10-CM | POA: Diagnosis not present

## 2023-04-19 DIAGNOSIS — Z79899 Other long term (current) drug therapy: Secondary | ICD-10-CM | POA: Diagnosis not present

## 2023-04-22 DIAGNOSIS — G309 Alzheimer's disease, unspecified: Secondary | ICD-10-CM | POA: Diagnosis not present

## 2023-04-22 DIAGNOSIS — R1319 Other dysphagia: Secondary | ICD-10-CM | POA: Diagnosis not present

## 2023-04-23 DIAGNOSIS — G309 Alzheimer's disease, unspecified: Secondary | ICD-10-CM | POA: Diagnosis not present

## 2023-04-23 DIAGNOSIS — R1319 Other dysphagia: Secondary | ICD-10-CM | POA: Diagnosis not present

## 2023-04-24 DIAGNOSIS — G309 Alzheimer's disease, unspecified: Secondary | ICD-10-CM | POA: Diagnosis not present

## 2023-04-24 DIAGNOSIS — R1319 Other dysphagia: Secondary | ICD-10-CM | POA: Diagnosis not present

## 2023-04-25 DIAGNOSIS — G309 Alzheimer's disease, unspecified: Secondary | ICD-10-CM | POA: Diagnosis not present

## 2023-04-25 DIAGNOSIS — R1319 Other dysphagia: Secondary | ICD-10-CM | POA: Diagnosis not present

## 2023-04-26 DIAGNOSIS — G309 Alzheimer's disease, unspecified: Secondary | ICD-10-CM | POA: Diagnosis not present

## 2023-04-26 DIAGNOSIS — R1319 Other dysphagia: Secondary | ICD-10-CM | POA: Diagnosis not present

## 2023-04-29 DIAGNOSIS — R1319 Other dysphagia: Secondary | ICD-10-CM | POA: Diagnosis not present

## 2023-04-29 DIAGNOSIS — G309 Alzheimer's disease, unspecified: Secondary | ICD-10-CM | POA: Diagnosis not present

## 2023-04-30 DIAGNOSIS — R1319 Other dysphagia: Secondary | ICD-10-CM | POA: Diagnosis not present

## 2023-04-30 DIAGNOSIS — G309 Alzheimer's disease, unspecified: Secondary | ICD-10-CM | POA: Diagnosis not present

## 2023-05-01 DIAGNOSIS — R296 Repeated falls: Secondary | ICD-10-CM | POA: Diagnosis not present

## 2023-05-01 DIAGNOSIS — G309 Alzheimer's disease, unspecified: Secondary | ICD-10-CM | POA: Diagnosis not present

## 2023-05-01 DIAGNOSIS — R262 Difficulty in walking, not elsewhere classified: Secondary | ICD-10-CM | POA: Diagnosis not present

## 2023-05-01 DIAGNOSIS — R2689 Other abnormalities of gait and mobility: Secondary | ICD-10-CM | POA: Diagnosis not present

## 2023-05-01 DIAGNOSIS — R1319 Other dysphagia: Secondary | ICD-10-CM | POA: Diagnosis not present

## 2023-05-02 DIAGNOSIS — R1319 Other dysphagia: Secondary | ICD-10-CM | POA: Diagnosis not present

## 2023-05-02 DIAGNOSIS — R2689 Other abnormalities of gait and mobility: Secondary | ICD-10-CM | POA: Diagnosis not present

## 2023-05-02 DIAGNOSIS — R262 Difficulty in walking, not elsewhere classified: Secondary | ICD-10-CM | POA: Diagnosis not present

## 2023-05-02 DIAGNOSIS — R296 Repeated falls: Secondary | ICD-10-CM | POA: Diagnosis not present

## 2023-05-02 DIAGNOSIS — G309 Alzheimer's disease, unspecified: Secondary | ICD-10-CM | POA: Diagnosis not present

## 2023-05-03 DIAGNOSIS — R2689 Other abnormalities of gait and mobility: Secondary | ICD-10-CM | POA: Diagnosis not present

## 2023-05-03 DIAGNOSIS — R262 Difficulty in walking, not elsewhere classified: Secondary | ICD-10-CM | POA: Diagnosis not present

## 2023-05-03 DIAGNOSIS — R296 Repeated falls: Secondary | ICD-10-CM | POA: Diagnosis not present

## 2023-05-03 DIAGNOSIS — R1319 Other dysphagia: Secondary | ICD-10-CM | POA: Diagnosis not present

## 2023-05-03 DIAGNOSIS — G309 Alzheimer's disease, unspecified: Secondary | ICD-10-CM | POA: Diagnosis not present

## 2023-05-06 DIAGNOSIS — R296 Repeated falls: Secondary | ICD-10-CM | POA: Diagnosis not present

## 2023-05-06 DIAGNOSIS — R1319 Other dysphagia: Secondary | ICD-10-CM | POA: Diagnosis not present

## 2023-05-06 DIAGNOSIS — G309 Alzheimer's disease, unspecified: Secondary | ICD-10-CM | POA: Diagnosis not present

## 2023-05-06 DIAGNOSIS — R262 Difficulty in walking, not elsewhere classified: Secondary | ICD-10-CM | POA: Diagnosis not present

## 2023-05-06 DIAGNOSIS — R2689 Other abnormalities of gait and mobility: Secondary | ICD-10-CM | POA: Diagnosis not present

## 2023-05-07 DIAGNOSIS — R1319 Other dysphagia: Secondary | ICD-10-CM | POA: Diagnosis not present

## 2023-05-07 DIAGNOSIS — R2689 Other abnormalities of gait and mobility: Secondary | ICD-10-CM | POA: Diagnosis not present

## 2023-05-07 DIAGNOSIS — R262 Difficulty in walking, not elsewhere classified: Secondary | ICD-10-CM | POA: Diagnosis not present

## 2023-05-07 DIAGNOSIS — R296 Repeated falls: Secondary | ICD-10-CM | POA: Diagnosis not present

## 2023-05-07 DIAGNOSIS — G309 Alzheimer's disease, unspecified: Secondary | ICD-10-CM | POA: Diagnosis not present

## 2023-05-08 DIAGNOSIS — R262 Difficulty in walking, not elsewhere classified: Secondary | ICD-10-CM | POA: Diagnosis not present

## 2023-05-08 DIAGNOSIS — R1319 Other dysphagia: Secondary | ICD-10-CM | POA: Diagnosis not present

## 2023-05-08 DIAGNOSIS — R2689 Other abnormalities of gait and mobility: Secondary | ICD-10-CM | POA: Diagnosis not present

## 2023-05-08 DIAGNOSIS — R296 Repeated falls: Secondary | ICD-10-CM | POA: Diagnosis not present

## 2023-05-08 DIAGNOSIS — G309 Alzheimer's disease, unspecified: Secondary | ICD-10-CM | POA: Diagnosis not present

## 2023-05-09 DIAGNOSIS — R1319 Other dysphagia: Secondary | ICD-10-CM | POA: Diagnosis not present

## 2023-05-09 DIAGNOSIS — R2689 Other abnormalities of gait and mobility: Secondary | ICD-10-CM | POA: Diagnosis not present

## 2023-05-09 DIAGNOSIS — R296 Repeated falls: Secondary | ICD-10-CM | POA: Diagnosis not present

## 2023-05-09 DIAGNOSIS — R262 Difficulty in walking, not elsewhere classified: Secondary | ICD-10-CM | POA: Diagnosis not present

## 2023-05-09 DIAGNOSIS — G309 Alzheimer's disease, unspecified: Secondary | ICD-10-CM | POA: Diagnosis not present

## 2023-05-10 DIAGNOSIS — R262 Difficulty in walking, not elsewhere classified: Secondary | ICD-10-CM | POA: Diagnosis not present

## 2023-05-10 DIAGNOSIS — G309 Alzheimer's disease, unspecified: Secondary | ICD-10-CM | POA: Diagnosis not present

## 2023-05-10 DIAGNOSIS — R296 Repeated falls: Secondary | ICD-10-CM | POA: Diagnosis not present

## 2023-05-10 DIAGNOSIS — R2689 Other abnormalities of gait and mobility: Secondary | ICD-10-CM | POA: Diagnosis not present

## 2023-05-10 DIAGNOSIS — R1319 Other dysphagia: Secondary | ICD-10-CM | POA: Diagnosis not present

## 2023-05-11 ENCOUNTER — Emergency Department (HOSPITAL_COMMUNITY)
Admission: EM | Admit: 2023-05-11 | Discharge: 2023-05-12 | Disposition: A | Discharge status: Skilled Nursing Facility | Payer: Medicare Other | Attending: Emergency Medicine | Admitting: Emergency Medicine

## 2023-05-11 DIAGNOSIS — W19XXXA Unspecified fall, initial encounter: Secondary | ICD-10-CM

## 2023-05-11 DIAGNOSIS — M545 Low back pain, unspecified: Secondary | ICD-10-CM | POA: Diagnosis not present

## 2023-05-11 DIAGNOSIS — W010XXA Fall on same level from slipping, tripping and stumbling without subsequent striking against object, initial encounter: Secondary | ICD-10-CM | POA: Insufficient documentation

## 2023-05-11 DIAGNOSIS — R519 Headache, unspecified: Secondary | ICD-10-CM | POA: Insufficient documentation

## 2023-05-11 DIAGNOSIS — Z743 Need for continuous supervision: Secondary | ICD-10-CM | POA: Diagnosis not present

## 2023-05-11 DIAGNOSIS — R0689 Other abnormalities of breathing: Secondary | ICD-10-CM | POA: Diagnosis not present

## 2023-05-11 DIAGNOSIS — M542 Cervicalgia: Secondary | ICD-10-CM | POA: Diagnosis not present

## 2023-05-11 DIAGNOSIS — S0990XA Unspecified injury of head, initial encounter: Secondary | ICD-10-CM | POA: Diagnosis not present

## 2023-05-11 DIAGNOSIS — Y92129 Unspecified place in nursing home as the place of occurrence of the external cause: Secondary | ICD-10-CM | POA: Insufficient documentation

## 2023-05-11 DIAGNOSIS — I499 Cardiac arrhythmia, unspecified: Secondary | ICD-10-CM | POA: Diagnosis not present

## 2023-05-11 DIAGNOSIS — S199XXA Unspecified injury of neck, initial encounter: Secondary | ICD-10-CM | POA: Diagnosis not present

## 2023-05-12 ENCOUNTER — Emergency Department (HOSPITAL_COMMUNITY): Payer: Medicare Other

## 2023-05-12 ENCOUNTER — Encounter (HOSPITAL_COMMUNITY): Payer: Self-pay

## 2023-05-12 DIAGNOSIS — S199XXA Unspecified injury of neck, initial encounter: Secondary | ICD-10-CM | POA: Diagnosis not present

## 2023-05-12 DIAGNOSIS — M545 Low back pain, unspecified: Secondary | ICD-10-CM | POA: Diagnosis not present

## 2023-05-12 DIAGNOSIS — S0990XA Unspecified injury of head, initial encounter: Secondary | ICD-10-CM | POA: Diagnosis not present

## 2023-05-12 NOTE — ED Triage Notes (Signed)
BIB EMS/ from [facility]/ fall after having BM in floor/ pt slipped, injuring lower back/hip pain/ tenderness to neck/ c-collar in place/ denies LOC or hitting head

## 2023-05-12 NOTE — ED Provider Notes (Signed)
[place] EMERGENCY DEPARTMENT AT [facility]  Provider Note  CSN: [id number] Arrival date & time: 05/11/23 2355  History Chief Complaint  Patient presents with   Katelyn Ballard    Katelyn Ballard is a 78 y.o. female brought to the ED via EMS who give history. Patient from ALF with unwitnessed fall. Apparently she slipped in some feces on the floor of her bathroom. She is unsure what happened. Unknown head injury. Was complaining of headache and neck pain with EMS, denies to me.    Home Medications Prior to Admission medications   Medication Sig Start Date End Date Taking? Authorizing Provider  allopurinol (ZYLOPRIM) 300 MG tablet Take 1 tablet (300 mg total) by mouth daily. 08/24/21   Daphine Deutscher, Mary-Margaret, FNP  amLODipine (NORVASC) 10 MG tablet Take 1 tablet (10 mg total) by mouth daily. 08/24/21   Daphine Deutscher Mary-Margaret, FNP  blood glucose meter kit and supplies Check blood sugar bid and as needed Dx: E11.42 03/11/20   Bennie Pierini, FNP  blood glucose meter kit and supplies Dispense based on patient and insurance preference. Use up to four times daily as directed. (FOR ICD-10 E10.9, E11.9). 04/18/21   Daphine Deutscher, Mary-Margaret, FNP  fenofibrate 160 MG tablet Take 1 tablet (160 mg total) by mouth daily. (Needs to be seen before next refill) 08/24/21   Bennie Pierini, FNP  glimepiride (AMARYL) 4 MG tablet Take 2 tablets (8 mg total) by mouth daily before breakfast. 08/24/21   Daphine Deutscher, Mary-Margaret, FNP  glucose blood (ONETOUCH ULTRA) test strip USE 1 STRIP TO CHECK GLUCOSE 2 TO 3 TIMES DAILY 03/30/21   Daphine Deutscher, Mary-Margaret, FNP  lisinopril (ZESTRIL) 40 MG tablet Take 1 tablet (40 mg total) by mouth daily. (Needs to be seen before next refill) 08/24/21   Bennie Pierini, FNP  metFORMIN (GLUCOPHAGE) 1000 MG tablet Take 1 tablet (1,000 mg total) by mouth 2 (two) times daily with a meal. (NEEDS TO BE SEEN BEFORE NEXT REFILL) 03/19/22   Bennie Pierini, FNP  rosuvastatin  (CRESTOR) 10 MG tablet Take 1 tablet (10 mg total) by mouth daily. 08/24/21   Daphine Deutscher Mary-Margaret, FNP     Allergies    Atorvastatin, Shellfish allergy, and Penicillins   Review of Systems   Review of Systems Please see HPI for pertinent positives and negatives  Physical Exam BP (!) 165/81 (BP Location: Left Arm)   Pulse (!) 54   Temp 98.8 F (37.1 C) (Oral)   Resp 14   Wt 70.3 kg   SpO2 95%   BMI 27.46 kg/m   Physical Exam Vitals and nursing note reviewed.  Constitutional:      Appearance: Normal appearance.  HENT:     Head: Normocephalic and atraumatic.     Nose: Nose normal.     Mouth/Throat:     Mouth: Mucous membranes are moist.  Eyes:     Extraocular Movements: Extraocular movements intact.     Conjunctiva/sclera: Conjunctivae normal.  Neck:     Comments: In c-collar Cardiovascular:     Rate and Rhythm: Normal rate.  Pulmonary:     Effort: Pulmonary effort is normal.     Breath sounds: Normal breath sounds.  Abdominal:     General: Abdomen is flat.     Palpations: Abdomen is soft.     Tenderness: There is no abdominal tenderness.  Musculoskeletal:        General: Tenderness (midline L spine, otherwise no focal bony tenderness) present. No swelling or deformity. Normal range of motion.  Skin:    General: Skin is warm and dry.  Neurological:     General: No focal deficit present.     Mental Status: She is alert. Mental status is at baseline.  Psychiatric:        Mood and Affect: Mood normal.     ED Results / Procedures / Treatments   EKG None  Procedures Procedures  Medications Ordered in the ED Medications - No data to display  Initial Impression and Plan  Patient here from ALF with unwitnessed fall. No significant injuries evident but given possible head/neck injury, will send for CT. Some tenderness in lower back, so check xray.   ED Course   Clinical Course as of 05/12/23 0110  Wynelle Link May 12, 2023  0109 I personally viewed the images  from radiology studies and agree with radiologist interpretation: Xray and CT neg for acute injury. Patient denies any other new complaints since arrival and is eager to go back to her facility. PCP follow up, RTED for any other concerns.   [CS]    Clinical Course User Index [CS] Pollyann Savoy, MD     MDM Rules/Calculators/A&P Medical Decision Making Problems Addressed: Fall, initial encounter: acute illness or injury  Amount and/or Complexity of Data Reviewed Radiology: ordered and independent interpretation performed. Decision-making details documented in ED Course.     Final Clinical Impression(s) / ED Diagnoses Final diagnoses:  Fall, initial encounter    Rx / DC Orders ED Discharge Orders     None        Pollyann Savoy, MD 05/12/23 0110

## 2023-05-13 DIAGNOSIS — G309 Alzheimer's disease, unspecified: Secondary | ICD-10-CM | POA: Diagnosis not present

## 2023-05-13 DIAGNOSIS — E782 Mixed hyperlipidemia: Secondary | ICD-10-CM | POA: Diagnosis not present

## 2023-05-13 DIAGNOSIS — R1319 Other dysphagia: Secondary | ICD-10-CM | POA: Diagnosis not present

## 2023-05-13 DIAGNOSIS — D649 Anemia, unspecified: Secondary | ICD-10-CM | POA: Diagnosis not present

## 2023-05-13 DIAGNOSIS — R262 Difficulty in walking, not elsewhere classified: Secondary | ICD-10-CM | POA: Diagnosis not present

## 2023-05-13 DIAGNOSIS — R2689 Other abnormalities of gait and mobility: Secondary | ICD-10-CM | POA: Diagnosis not present

## 2023-05-13 DIAGNOSIS — R296 Repeated falls: Secondary | ICD-10-CM | POA: Diagnosis not present

## 2023-05-20 DIAGNOSIS — E785 Hyperlipidemia, unspecified: Secondary | ICD-10-CM | POA: Diagnosis not present

## 2023-05-20 DIAGNOSIS — G309 Alzheimer's disease, unspecified: Secondary | ICD-10-CM | POA: Diagnosis not present

## 2023-05-20 DIAGNOSIS — I10 Essential (primary) hypertension: Secondary | ICD-10-CM | POA: Diagnosis not present

## 2023-05-20 DIAGNOSIS — I5032 Chronic diastolic (congestive) heart failure: Secondary | ICD-10-CM | POA: Diagnosis not present

## 2023-05-20 DIAGNOSIS — F02B18 Dementia in other diseases classified elsewhere, moderate, with other behavioral disturbance: Secondary | ICD-10-CM | POA: Diagnosis not present

## 2023-05-23 DIAGNOSIS — G309 Alzheimer's disease, unspecified: Secondary | ICD-10-CM | POA: Diagnosis not present

## 2023-05-23 DIAGNOSIS — M6281 Muscle weakness (generalized): Secondary | ICD-10-CM | POA: Diagnosis not present

## 2023-05-24 DIAGNOSIS — M6281 Muscle weakness (generalized): Secondary | ICD-10-CM | POA: Diagnosis not present

## 2023-05-24 DIAGNOSIS — G309 Alzheimer's disease, unspecified: Secondary | ICD-10-CM | POA: Diagnosis not present

## 2023-05-26 DIAGNOSIS — G309 Alzheimer's disease, unspecified: Secondary | ICD-10-CM | POA: Diagnosis not present

## 2023-05-26 DIAGNOSIS — M6281 Muscle weakness (generalized): Secondary | ICD-10-CM | POA: Diagnosis not present

## 2023-05-28 DIAGNOSIS — G309 Alzheimer's disease, unspecified: Secondary | ICD-10-CM | POA: Diagnosis not present

## 2023-05-28 DIAGNOSIS — M6281 Muscle weakness (generalized): Secondary | ICD-10-CM | POA: Diagnosis not present

## 2023-05-29 DIAGNOSIS — M6281 Muscle weakness (generalized): Secondary | ICD-10-CM | POA: Diagnosis not present

## 2023-05-29 DIAGNOSIS — G309 Alzheimer's disease, unspecified: Secondary | ICD-10-CM | POA: Diagnosis not present

## 2023-05-30 DIAGNOSIS — M6281 Muscle weakness (generalized): Secondary | ICD-10-CM | POA: Diagnosis not present

## 2023-05-30 DIAGNOSIS — G309 Alzheimer's disease, unspecified: Secondary | ICD-10-CM | POA: Diagnosis not present

## 2023-05-31 DIAGNOSIS — M6281 Muscle weakness (generalized): Secondary | ICD-10-CM | POA: Diagnosis not present

## 2023-05-31 DIAGNOSIS — G309 Alzheimer's disease, unspecified: Secondary | ICD-10-CM | POA: Diagnosis not present

## 2023-06-03 DIAGNOSIS — R058 Other specified cough: Secondary | ICD-10-CM | POA: Diagnosis not present

## 2023-06-03 DIAGNOSIS — M6281 Muscle weakness (generalized): Secondary | ICD-10-CM | POA: Diagnosis not present

## 2023-06-03 DIAGNOSIS — R0989 Other specified symptoms and signs involving the circulatory and respiratory systems: Secondary | ICD-10-CM | POA: Diagnosis not present

## 2023-06-03 DIAGNOSIS — G309 Alzheimer's disease, unspecified: Secondary | ICD-10-CM | POA: Diagnosis not present

## 2023-06-03 DIAGNOSIS — J302 Other seasonal allergic rhinitis: Secondary | ICD-10-CM | POA: Diagnosis not present

## 2023-06-03 DIAGNOSIS — R059 Cough, unspecified: Secondary | ICD-10-CM | POA: Diagnosis not present

## 2023-06-04 DIAGNOSIS — M6281 Muscle weakness (generalized): Secondary | ICD-10-CM | POA: Diagnosis not present

## 2023-06-04 DIAGNOSIS — G309 Alzheimer's disease, unspecified: Secondary | ICD-10-CM | POA: Diagnosis not present

## 2023-06-05 DIAGNOSIS — G309 Alzheimer's disease, unspecified: Secondary | ICD-10-CM | POA: Diagnosis not present

## 2023-06-05 DIAGNOSIS — M6281 Muscle weakness (generalized): Secondary | ICD-10-CM | POA: Diagnosis not present

## 2023-06-06 DIAGNOSIS — G309 Alzheimer's disease, unspecified: Secondary | ICD-10-CM | POA: Diagnosis not present

## 2023-06-06 DIAGNOSIS — M6281 Muscle weakness (generalized): Secondary | ICD-10-CM | POA: Diagnosis not present

## 2023-06-07 DIAGNOSIS — M6281 Muscle weakness (generalized): Secondary | ICD-10-CM | POA: Diagnosis not present

## 2023-06-07 DIAGNOSIS — G309 Alzheimer's disease, unspecified: Secondary | ICD-10-CM | POA: Diagnosis not present

## 2023-06-07 DIAGNOSIS — N390 Urinary tract infection, site not specified: Secondary | ICD-10-CM | POA: Diagnosis not present

## 2023-06-10 DIAGNOSIS — N390 Urinary tract infection, site not specified: Secondary | ICD-10-CM | POA: Diagnosis not present

## 2023-06-10 DIAGNOSIS — Z79899 Other long term (current) drug therapy: Secondary | ICD-10-CM | POA: Diagnosis not present

## 2023-06-29 DIAGNOSIS — M25571 Pain in right ankle and joints of right foot: Secondary | ICD-10-CM | POA: Diagnosis not present

## 2023-07-01 DIAGNOSIS — M1A9XX0 Chronic gout, unspecified, without tophus (tophi): Secondary | ICD-10-CM | POA: Diagnosis not present

## 2023-07-01 DIAGNOSIS — R52 Pain, unspecified: Secondary | ICD-10-CM | POA: Diagnosis not present

## 2023-07-01 DIAGNOSIS — Z9181 History of falling: Secondary | ICD-10-CM | POA: Diagnosis not present

## 2023-07-01 DIAGNOSIS — M25571 Pain in right ankle and joints of right foot: Secondary | ICD-10-CM | POA: Diagnosis not present

## 2023-07-01 DIAGNOSIS — M79671 Pain in right foot: Secondary | ICD-10-CM | POA: Diagnosis not present

## 2023-07-01 DIAGNOSIS — Z79899 Other long term (current) drug therapy: Secondary | ICD-10-CM | POA: Diagnosis not present

## 2023-07-01 DIAGNOSIS — M25471 Effusion, right ankle: Secondary | ICD-10-CM | POA: Diagnosis not present

## 2023-07-23 DIAGNOSIS — N390 Urinary tract infection, site not specified: Secondary | ICD-10-CM | POA: Diagnosis not present

## 2023-07-23 DIAGNOSIS — E782 Mixed hyperlipidemia: Secondary | ICD-10-CM | POA: Diagnosis not present

## 2023-07-23 DIAGNOSIS — D649 Anemia, unspecified: Secondary | ICD-10-CM | POA: Diagnosis not present

## 2023-07-24 DIAGNOSIS — I10 Essential (primary) hypertension: Secondary | ICD-10-CM | POA: Diagnosis not present

## 2023-07-24 DIAGNOSIS — E559 Vitamin D deficiency, unspecified: Secondary | ICD-10-CM | POA: Diagnosis not present

## 2023-07-24 DIAGNOSIS — F32A Depression, unspecified: Secondary | ICD-10-CM | POA: Diagnosis not present

## 2023-07-24 DIAGNOSIS — I2510 Atherosclerotic heart disease of native coronary artery without angina pectoris: Secondary | ICD-10-CM | POA: Diagnosis not present

## 2023-07-24 DIAGNOSIS — G309 Alzheimer's disease, unspecified: Secondary | ICD-10-CM | POA: Diagnosis not present

## 2023-07-24 DIAGNOSIS — I5032 Chronic diastolic (congestive) heart failure: Secondary | ICD-10-CM | POA: Diagnosis not present

## 2023-07-24 DIAGNOSIS — M109 Gout, unspecified: Secondary | ICD-10-CM | POA: Diagnosis not present

## 2023-07-24 DIAGNOSIS — E785 Hyperlipidemia, unspecified: Secondary | ICD-10-CM | POA: Diagnosis not present

## 2023-07-24 DIAGNOSIS — E119 Type 2 diabetes mellitus without complications: Secondary | ICD-10-CM | POA: Diagnosis not present

## 2023-07-26 DIAGNOSIS — Z79899 Other long term (current) drug therapy: Secondary | ICD-10-CM | POA: Diagnosis not present

## 2023-07-26 DIAGNOSIS — N390 Urinary tract infection, site not specified: Secondary | ICD-10-CM | POA: Diagnosis not present

## 2023-08-01 DIAGNOSIS — M6281 Muscle weakness (generalized): Secondary | ICD-10-CM | POA: Diagnosis not present

## 2023-08-01 DIAGNOSIS — G309 Alzheimer's disease, unspecified: Secondary | ICD-10-CM | POA: Diagnosis not present

## 2023-08-01 DIAGNOSIS — R278 Other lack of coordination: Secondary | ICD-10-CM | POA: Diagnosis not present

## 2023-08-02 DIAGNOSIS — R278 Other lack of coordination: Secondary | ICD-10-CM | POA: Diagnosis not present

## 2023-08-02 DIAGNOSIS — M6281 Muscle weakness (generalized): Secondary | ICD-10-CM | POA: Diagnosis not present

## 2023-08-02 DIAGNOSIS — G309 Alzheimer's disease, unspecified: Secondary | ICD-10-CM | POA: Diagnosis not present

## 2023-08-04 DIAGNOSIS — G309 Alzheimer's disease, unspecified: Secondary | ICD-10-CM | POA: Diagnosis not present

## 2023-08-04 DIAGNOSIS — M6281 Muscle weakness (generalized): Secondary | ICD-10-CM | POA: Diagnosis not present

## 2023-08-04 DIAGNOSIS — R278 Other lack of coordination: Secondary | ICD-10-CM | POA: Diagnosis not present

## 2023-08-05 DIAGNOSIS — M6281 Muscle weakness (generalized): Secondary | ICD-10-CM | POA: Diagnosis not present

## 2023-08-05 DIAGNOSIS — R278 Other lack of coordination: Secondary | ICD-10-CM | POA: Diagnosis not present

## 2023-08-05 DIAGNOSIS — G309 Alzheimer's disease, unspecified: Secondary | ICD-10-CM | POA: Diagnosis not present

## 2023-08-05 DIAGNOSIS — F02B3 Dementia in other diseases classified elsewhere, moderate, with mood disturbance: Secondary | ICD-10-CM | POA: Diagnosis not present

## 2023-08-06 DIAGNOSIS — G309 Alzheimer's disease, unspecified: Secondary | ICD-10-CM | POA: Diagnosis not present

## 2023-08-06 DIAGNOSIS — R278 Other lack of coordination: Secondary | ICD-10-CM | POA: Diagnosis not present

## 2023-08-06 DIAGNOSIS — M6281 Muscle weakness (generalized): Secondary | ICD-10-CM | POA: Diagnosis not present

## 2023-08-08 DIAGNOSIS — M6281 Muscle weakness (generalized): Secondary | ICD-10-CM | POA: Diagnosis not present

## 2023-08-08 DIAGNOSIS — G309 Alzheimer's disease, unspecified: Secondary | ICD-10-CM | POA: Diagnosis not present

## 2023-08-08 DIAGNOSIS — R278 Other lack of coordination: Secondary | ICD-10-CM | POA: Diagnosis not present

## 2023-08-09 DIAGNOSIS — G309 Alzheimer's disease, unspecified: Secondary | ICD-10-CM | POA: Diagnosis not present

## 2023-08-09 DIAGNOSIS — R278 Other lack of coordination: Secondary | ICD-10-CM | POA: Diagnosis not present

## 2023-08-09 DIAGNOSIS — M6281 Muscle weakness (generalized): Secondary | ICD-10-CM | POA: Diagnosis not present

## 2023-08-12 DIAGNOSIS — M6281 Muscle weakness (generalized): Secondary | ICD-10-CM | POA: Diagnosis not present

## 2023-08-12 DIAGNOSIS — R278 Other lack of coordination: Secondary | ICD-10-CM | POA: Diagnosis not present

## 2023-08-12 DIAGNOSIS — G309 Alzheimer's disease, unspecified: Secondary | ICD-10-CM | POA: Diagnosis not present

## 2023-08-13 DIAGNOSIS — F02B18 Dementia in other diseases classified elsewhere, moderate, with other behavioral disturbance: Secondary | ICD-10-CM | POA: Diagnosis not present

## 2023-08-13 DIAGNOSIS — I10 Essential (primary) hypertension: Secondary | ICD-10-CM | POA: Diagnosis not present

## 2023-08-13 DIAGNOSIS — Z79899 Other long term (current) drug therapy: Secondary | ICD-10-CM | POA: Diagnosis not present

## 2023-08-13 DIAGNOSIS — F32A Depression, unspecified: Secondary | ICD-10-CM | POA: Diagnosis not present

## 2023-08-13 DIAGNOSIS — R278 Other lack of coordination: Secondary | ICD-10-CM | POA: Diagnosis not present

## 2023-08-13 DIAGNOSIS — G309 Alzheimer's disease, unspecified: Secondary | ICD-10-CM | POA: Diagnosis not present

## 2023-08-13 DIAGNOSIS — E785 Hyperlipidemia, unspecified: Secondary | ICD-10-CM | POA: Diagnosis not present

## 2023-08-13 DIAGNOSIS — M6281 Muscle weakness (generalized): Secondary | ICD-10-CM | POA: Diagnosis not present

## 2023-08-14 DIAGNOSIS — R278 Other lack of coordination: Secondary | ICD-10-CM | POA: Diagnosis not present

## 2023-08-14 DIAGNOSIS — G309 Alzheimer's disease, unspecified: Secondary | ICD-10-CM | POA: Diagnosis not present

## 2023-08-14 DIAGNOSIS — M6281 Muscle weakness (generalized): Secondary | ICD-10-CM | POA: Diagnosis not present

## 2023-09-19 DIAGNOSIS — I10 Essential (primary) hypertension: Secondary | ICD-10-CM | POA: Diagnosis not present

## 2023-09-19 DIAGNOSIS — E119 Type 2 diabetes mellitus without complications: Secondary | ICD-10-CM | POA: Diagnosis not present

## 2023-09-19 DIAGNOSIS — Z79899 Other long term (current) drug therapy: Secondary | ICD-10-CM | POA: Diagnosis not present

## 2023-09-23 ENCOUNTER — Emergency Department (HOSPITAL_COMMUNITY): Payer: Medicare Other

## 2023-09-23 ENCOUNTER — Inpatient Hospital Stay (HOSPITAL_COMMUNITY)
Admission: EM | Admit: 2023-09-23 | Discharge: 2023-09-26 | DRG: 638 | Disposition: A | Discharge status: Skilled Nursing Facility | Payer: Medicare Other | Source: Skilled Nursing Facility | Attending: Internal Medicine | Admitting: Internal Medicine

## 2023-09-23 ENCOUNTER — Encounter (HOSPITAL_COMMUNITY): Payer: Self-pay

## 2023-09-23 ENCOUNTER — Other Ambulatory Visit: Payer: Self-pay

## 2023-09-23 DIAGNOSIS — E119 Type 2 diabetes mellitus without complications: Secondary | ICD-10-CM

## 2023-09-23 DIAGNOSIS — Z7984 Long term (current) use of oral hypoglycemic drugs: Secondary | ICD-10-CM

## 2023-09-23 DIAGNOSIS — E785 Hyperlipidemia, unspecified: Secondary | ICD-10-CM | POA: Diagnosis not present

## 2023-09-23 DIAGNOSIS — E1169 Type 2 diabetes mellitus with other specified complication: Principal | ICD-10-CM | POA: Diagnosis present

## 2023-09-23 DIAGNOSIS — S0003XA Contusion of scalp, initial encounter: Secondary | ICD-10-CM | POA: Diagnosis present

## 2023-09-23 DIAGNOSIS — M25561 Pain in right knee: Secondary | ICD-10-CM | POA: Diagnosis not present

## 2023-09-23 DIAGNOSIS — M109 Gout, unspecified: Secondary | ICD-10-CM | POA: Diagnosis not present

## 2023-09-23 DIAGNOSIS — I10 Essential (primary) hypertension: Secondary | ICD-10-CM | POA: Diagnosis present

## 2023-09-23 DIAGNOSIS — S0990XA Unspecified injury of head, initial encounter: Secondary | ICD-10-CM

## 2023-09-23 DIAGNOSIS — F05 Delirium due to known physiological condition: Secondary | ICD-10-CM | POA: Diagnosis present

## 2023-09-23 DIAGNOSIS — Z87891 Personal history of nicotine dependence: Secondary | ICD-10-CM | POA: Diagnosis not present

## 2023-09-23 DIAGNOSIS — S199XXA Unspecified injury of neck, initial encounter: Secondary | ICD-10-CM | POA: Diagnosis not present

## 2023-09-23 DIAGNOSIS — J9811 Atelectasis: Secondary | ICD-10-CM | POA: Diagnosis not present

## 2023-09-23 DIAGNOSIS — M4313 Spondylolisthesis, cervicothoracic region: Secondary | ICD-10-CM | POA: Diagnosis not present

## 2023-09-23 DIAGNOSIS — S80211A Abrasion, right knee, initial encounter: Secondary | ICD-10-CM | POA: Diagnosis not present

## 2023-09-23 DIAGNOSIS — S8001XA Contusion of right knee, initial encounter: Secondary | ICD-10-CM | POA: Diagnosis not present

## 2023-09-23 DIAGNOSIS — Z9181 History of falling: Secondary | ICD-10-CM | POA: Diagnosis not present

## 2023-09-23 DIAGNOSIS — M4802 Spinal stenosis, cervical region: Secondary | ICD-10-CM | POA: Diagnosis not present

## 2023-09-23 DIAGNOSIS — M4642 Discitis, unspecified, cervical region: Secondary | ICD-10-CM | POA: Diagnosis not present

## 2023-09-23 DIAGNOSIS — F0390 Unspecified dementia without behavioral disturbance: Secondary | ICD-10-CM | POA: Diagnosis present

## 2023-09-23 DIAGNOSIS — R404 Transient alteration of awareness: Secondary | ICD-10-CM | POA: Diagnosis not present

## 2023-09-23 DIAGNOSIS — Z91013 Allergy to seafood: Secondary | ICD-10-CM

## 2023-09-23 DIAGNOSIS — Z66 Do not resuscitate: Secondary | ICD-10-CM | POA: Diagnosis present

## 2023-09-23 DIAGNOSIS — Z833 Family history of diabetes mellitus: Secondary | ICD-10-CM

## 2023-09-23 DIAGNOSIS — Z743 Need for continuous supervision: Secondary | ICD-10-CM | POA: Diagnosis not present

## 2023-09-23 DIAGNOSIS — Z90710 Acquired absence of both cervix and uterus: Secondary | ICD-10-CM

## 2023-09-23 DIAGNOSIS — I700 Atherosclerosis of aorta: Secondary | ICD-10-CM | POA: Diagnosis not present

## 2023-09-23 DIAGNOSIS — M869 Osteomyelitis, unspecified: Secondary | ICD-10-CM | POA: Diagnosis not present

## 2023-09-23 DIAGNOSIS — Z880 Allergy status to penicillin: Secondary | ICD-10-CM | POA: Diagnosis not present

## 2023-09-23 DIAGNOSIS — Z8249 Family history of ischemic heart disease and other diseases of the circulatory system: Secondary | ICD-10-CM

## 2023-09-23 DIAGNOSIS — W19XXXA Unspecified fall, initial encounter: Principal | ICD-10-CM | POA: Diagnosis present

## 2023-09-23 DIAGNOSIS — M542 Cervicalgia: Secondary | ICD-10-CM | POA: Diagnosis not present

## 2023-09-23 DIAGNOSIS — E876 Hypokalemia: Secondary | ICD-10-CM | POA: Diagnosis not present

## 2023-09-23 DIAGNOSIS — Z79899 Other long term (current) drug therapy: Secondary | ICD-10-CM

## 2023-09-23 DIAGNOSIS — M4623 Osteomyelitis of vertebra, cervicothoracic region: Secondary | ICD-10-CM | POA: Diagnosis not present

## 2023-09-23 DIAGNOSIS — Z8673 Personal history of transient ischemic attack (TIA), and cerebral infarction without residual deficits: Secondary | ICD-10-CM

## 2023-09-23 DIAGNOSIS — Z993 Dependence on wheelchair: Secondary | ICD-10-CM

## 2023-09-23 DIAGNOSIS — M79604 Pain in right leg: Secondary | ICD-10-CM | POA: Diagnosis not present

## 2023-09-23 DIAGNOSIS — M47812 Spondylosis without myelopathy or radiculopathy, cervical region: Secondary | ICD-10-CM | POA: Diagnosis not present

## 2023-09-23 DIAGNOSIS — Z888 Allergy status to other drugs, medicaments and biological substances status: Secondary | ICD-10-CM | POA: Diagnosis not present

## 2023-09-23 DIAGNOSIS — G459 Transient cerebral ischemic attack, unspecified: Secondary | ICD-10-CM | POA: Diagnosis not present

## 2023-09-23 DIAGNOSIS — Z7401 Bed confinement status: Secondary | ICD-10-CM | POA: Diagnosis not present

## 2023-09-23 DIAGNOSIS — M539 Dorsopathy, unspecified: Secondary | ICD-10-CM

## 2023-09-23 DIAGNOSIS — M549 Dorsalgia, unspecified: Secondary | ICD-10-CM | POA: Diagnosis not present

## 2023-09-23 DIAGNOSIS — J432 Centrilobular emphysema: Secondary | ICD-10-CM | POA: Diagnosis not present

## 2023-09-23 LAB — URINALYSIS, ROUTINE W REFLEX MICROSCOPIC
Bilirubin Urine: NEGATIVE
Glucose, UA: NEGATIVE mg/dL
Ketones, ur: NEGATIVE mg/dL
Nitrite: NEGATIVE
Protein, ur: NEGATIVE mg/dL
Specific Gravity, Urine: 1.005 (ref 1.005–1.030)
pH: 5 (ref 5.0–8.0)

## 2023-09-23 LAB — CBC WITH DIFFERENTIAL/PLATELET
Abs Immature Granulocytes: 0.02 10*3/uL (ref 0.00–0.07)
Basophils Absolute: 0.1 10*3/uL (ref 0.0–0.1)
Basophils Relative: 1 %
Eosinophils Absolute: 0.3 10*3/uL (ref 0.0–0.5)
Eosinophils Relative: 3 %
HCT: 37.5 % (ref 36.0–46.0)
Hemoglobin: 11.1 g/dL — ABNORMAL LOW (ref 12.0–15.0)
Immature Granulocytes: 0 %
Lymphocytes Relative: 31 %
Lymphs Abs: 2.6 10*3/uL (ref 0.7–4.0)
MCH: 26.4 pg (ref 26.0–34.0)
MCHC: 29.6 g/dL — ABNORMAL LOW (ref 30.0–36.0)
MCV: 89.1 fL (ref 80.0–100.0)
Monocytes Absolute: 0.8 10*3/uL (ref 0.1–1.0)
Monocytes Relative: 10 %
Neutro Abs: 4.5 10*3/uL (ref 1.7–7.7)
Neutrophils Relative %: 55 %
Platelets: 184 10*3/uL (ref 150–400)
RBC: 4.21 MIL/uL (ref 3.87–5.11)
RDW: 18 % — ABNORMAL HIGH (ref 11.5–15.5)
WBC: 8.2 10*3/uL (ref 4.0–10.5)
nRBC: 0 % (ref 0.0–0.2)

## 2023-09-23 LAB — BASIC METABOLIC PANEL
Anion gap: 12 (ref 5–15)
BUN: 25 mg/dL — ABNORMAL HIGH (ref 8–23)
CO2: 27 mmol/L (ref 22–32)
Calcium: 8.4 mg/dL — ABNORMAL LOW (ref 8.9–10.3)
Chloride: 103 mmol/L (ref 98–111)
Creatinine, Ser: 1.08 mg/dL — ABNORMAL HIGH (ref 0.44–1.00)
GFR, Estimated: 52 mL/min — ABNORMAL LOW (ref >60)
Glucose, Bld: 147 mg/dL — ABNORMAL HIGH (ref 70–99)
Potassium: 3.3 mmol/L — ABNORMAL LOW (ref 3.5–5.1)
Sodium: 142 mmol/L (ref 135–145)

## 2023-09-23 LAB — GLUCOSE, CAPILLARY: Glucose-Capillary: 167 mg/dL — ABNORMAL HIGH (ref 70–99)

## 2023-09-23 MED ORDER — OXYCODONE HCL 5 MG PO TABS
5.0000 mg | ORAL_TABLET | ORAL | Status: DC | PRN
  Administered 2023-09-23 – 2023-09-26 (×3): 5 mg via ORAL
  Filled 2023-09-23 (×3): qty 1

## 2023-09-23 MED ORDER — GADOBUTROL 1 MMOL/ML IV SOLN
7.0000 mL | Freq: Once | INTRAVENOUS | Status: AC | PRN
  Administered 2023-09-23: 7 mL via INTRAVENOUS

## 2023-09-23 MED ORDER — ONDANSETRON HCL 4 MG PO TABS: 4.0000 mg | ORAL_TABLET | Freq: Four times a day (QID) | ORAL | Status: DC | PRN

## 2023-09-23 MED ORDER — CLONAZEPAM 0.5 MG PO TABS
0.5000 mg | ORAL_TABLET | Freq: Every day | ORAL | Status: DC
  Administered 2023-09-23 – 2023-09-25 (×3): 0.5 mg via ORAL
  Filled 2023-09-23 (×3): qty 1

## 2023-09-23 MED ORDER — ACETAMINOPHEN 650 MG RE SUPP: 650.0000 mg | Freq: Four times a day (QID) | RECTAL | Status: DC | PRN

## 2023-09-23 MED ORDER — METOPROLOL TARTRATE 12.5 MG HALF TABLET
12.5000 mg | ORAL_TABLET | Freq: Two times a day (BID) | ORAL | Status: DC
  Administered 2023-09-23 – 2023-09-26 (×6): 12.5 mg via ORAL
  Filled 2023-09-23 (×6): qty 1

## 2023-09-23 MED ORDER — IOHEXOL 300 MG/ML  SOLN
75.0000 mL | Freq: Once | INTRAMUSCULAR | Status: AC | PRN
  Administered 2023-09-23: 75 mL via INTRAVENOUS

## 2023-09-23 MED ORDER — INSULIN ASPART 100 UNIT/ML IJ SOLN: 0.0000 [IU] | Freq: Every day | INTRAMUSCULAR | Status: DC

## 2023-09-23 MED ORDER — INSULIN ASPART 100 UNIT/ML IJ SOLN
0.0000 [IU] | Freq: Three times a day (TID) | INTRAMUSCULAR | Status: DC
  Administered 2023-09-24 (×2): 2 [IU] via SUBCUTANEOUS
  Administered 2023-09-25: 5 [IU] via SUBCUTANEOUS
  Administered 2023-09-26: 2 [IU] via SUBCUTANEOUS

## 2023-09-23 MED ORDER — ACETAMINOPHEN 325 MG PO TABS: 650.0000 mg | ORAL_TABLET | Freq: Four times a day (QID) | ORAL | Status: DC | PRN

## 2023-09-23 MED ORDER — POTASSIUM CHLORIDE 20 MEQ PO PACK
40.0000 meq | PACK | Freq: Once | ORAL | Status: AC
  Administered 2023-09-23: 40 meq via ORAL
  Filled 2023-09-23: qty 2

## 2023-09-23 MED ORDER — DIVALPROEX SODIUM 125 MG PO CSDR
125.0000 mg | DELAYED_RELEASE_CAPSULE | Freq: Two times a day (BID) | ORAL | Status: DC
  Administered 2023-09-24: 125 mg via ORAL
  Filled 2023-09-23 (×2): qty 1

## 2023-09-23 MED ORDER — ROSUVASTATIN CALCIUM 5 MG PO TABS
10.0000 mg | ORAL_TABLET | Freq: Every day | ORAL | Status: DC
  Administered 2023-09-24 – 2023-09-26 (×3): 10 mg via ORAL
  Filled 2023-09-23 (×3): qty 2

## 2023-09-23 MED ORDER — ACETAMINOPHEN 325 MG PO TABS
650.0000 mg | ORAL_TABLET | Freq: Once | ORAL | Status: AC
  Administered 2023-09-23: 650 mg via ORAL
  Filled 2023-09-23: qty 2

## 2023-09-23 MED ORDER — ESCITALOPRAM OXALATE 10 MG PO TABS
5.0000 mg | ORAL_TABLET | Freq: Every day | ORAL | Status: DC
  Administered 2023-09-23 – 2023-09-25 (×3): 5 mg via ORAL
  Filled 2023-09-23 (×3): qty 1

## 2023-09-23 MED ORDER — ONDANSETRON HCL 4 MG/2ML IJ SOLN: 4.0000 mg | Freq: Four times a day (QID) | INTRAMUSCULAR | Status: DC | PRN

## 2023-09-23 NOTE — H&P (Signed)
History and Physical    Patient: Katelyn Ballard:[id number] DOB: [birth year] DOA: 09/23/2023 DOS: the patient was seen and examined on 09/23/2023 PCP: Bennie Pierini, FNP  Patient coming from: SNF  Chief Complaint:  Chief Complaint  Patient presents with   Fall    Fall last night   HPI: Katelyn Ballard is a 78 y.o. female with medical history significant of diabetes mellitus type 2, hyperlipidemia, hypertension, CVA, and more presents the ER with a chief complaint of fall.  Unfortunately due to patient's dementia she is not able to provide any history.  She does not recognize her daughter at bedside.  She does know her name and where she is, but not year or president.  Daughter at bedside reports that she saw patient yesterday and she was her normal self.  It is hard to establish a baseline mentation with this patient as she has volatile moods and waxing and waning confusion with her dementia.  Daughter said that yesterday seem to be a good day.  The facility then called daughter this a.m. and said the patient fell sometime during the night.  Patient is wheelchair-bound and does not walk.  It is unclear what her mechanism of her fall was.  She does have bruising on her forehead that makes it look like she took a nose dive out of bed.  Daughter asked the facility to FaceTime her, and when she saw the bruising on the forehead she requested transport to the ER.  In the ER patient had no acute fracture in the C-spine but she did have progressive erosions at C7 and T1.  MRI confirmed osteomyelitis/discitis.  Infectious disease was consulted and recommended no antibiotics at this time, transfer to [facility], IR biopsy of affected area.  CT head showed no acute intracranial abnormality.  It did show a frontal scalp hematoma.  Chest x-ray showed an atypical focal opacity in the left lung base.  Patient has no leukocytosis.  Patient reports at this time she is not in any pain.  Daughter reports that  patient has been complaining of knee pain.  Right knee x-ray shows no acute fracture or dislocation.  She does have a small abrasion on the right knee.  The pain was improved with Tylenol that she was given in the ED.  Patient has no other complaints at this time.  Patient does not drink or smoke.  Daughter reports that they have discussed it and patient is DNR. Review of Systems: unable to review all systems due to the inability of the patient to answer questions. Past Medical History:  Diagnosis Date   Ankle fracture, right    1990's   Diabetes mellitus without complication (HCC)    Gout 2006   Hyperlipidemia    Hypertension    Osteopenia    Past Surgical History:  Procedure Laterality Date   ABDOMINAL HYSTERECTOMY     Social History:  reports that she quit smoking about 30 years ago. Her smoking use included cigarettes. She has never used smokeless tobacco. She reports that she does not drink alcohol and does not use drugs.  Allergies  Allergen Reactions   Atorvastatin Other (See Comments)    myalgias   Shellfish Allergy     Other reaction(s): Unknown   Penicillins     Family History  Problem Relation Age of Onset   Diabetes Mother    Heart disease Mother    Kidney disease Mother        blockage of  renal artery   Dementia Mother    Heart disease Father    Heart disease Sister    Diabetes Sister    Diabetes Sister     Prior to Admission medications   Medication Sig Start Date End Date Taking? Authorizing Provider  allopurinol (ZYLOPRIM) 300 MG tablet Take 1 tablet (300 mg total) by mouth daily. 08/24/21   Daphine Deutscher, Mary-Margaret, FNP  amLODipine (NORVASC) 10 MG tablet Take 1 tablet (10 mg total) by mouth daily. 08/24/21   Daphine Deutscher Mary-Margaret, FNP  blood glucose meter kit and supplies Check blood sugar bid and as needed Dx: E11.42 03/11/20   Bennie Pierini, FNP  blood glucose meter kit and supplies Dispense based on patient and insurance preference. Use up to four  times daily as directed. (FOR ICD-10 E10.9, E11.9). 04/18/21   Daphine Deutscher, Mary-Margaret, FNP  fenofibrate 160 MG tablet Take 1 tablet (160 mg total) by mouth daily. (Needs to be seen before next refill) 08/24/21   Bennie Pierini, FNP  glimepiride (AMARYL) 4 MG tablet Take 2 tablets (8 mg total) by mouth daily before breakfast. 08/24/21   Daphine Deutscher, Mary-Margaret, FNP  glucose blood (ONETOUCH ULTRA) test strip USE 1 STRIP TO CHECK GLUCOSE 2 TO 3 TIMES DAILY 03/30/21   Daphine Deutscher, Mary-Margaret, FNP  lisinopril (ZESTRIL) 40 MG tablet Take 1 tablet (40 mg total) by mouth daily. (Needs to be seen before next refill) 08/24/21   Bennie Pierini, FNP  metFORMIN (GLUCOPHAGE) 1000 MG tablet Take 1 tablet (1,000 mg total) by mouth 2 (two) times daily with a meal. (NEEDS TO BE SEEN BEFORE NEXT REFILL) 03/19/22   Bennie Pierini, FNP  rosuvastatin (CRESTOR) 10 MG tablet Take 1 tablet (10 mg total) by mouth daily. 08/24/21   Bennie Pierini, FNP    Physical Exam: Vitals:   09/23/23 1245 09/23/23 1420 09/23/23 1721 09/23/23 1730  BP: 104/77 106/76 (!) 125/59 129/75  Pulse:  (!) 59 (!) 53 61  Resp:  18 16   Temp:  97.6 F (36.4 C) 97.6 F (36.4 C)   TempSrc:  Oral Oral   SpO2:  98% 94% 97%  Height:       1.  General: Patient lying supine in bed,  no acute distress   2. Psychiatric: Alert and oriented x 2, mood and behavior normal for situation, pleasant and cooperative with exam   3. Neurologic: Speech and language are normal, face is symmetric, moves all 4 extremities voluntarily, at baseline without acute deficits on limited exam   4. HEENMT:  Bruising over forehead, normocephalic, pupils reactive to light, neck is supple, trachea is midline, mucous membranes are moist   5. Respiratory : Lungs are clear to auscultation bilaterally without wheezing, rhonchi, rales, no cyanosis, no increase in work of breathing or accessory muscle use   6. Cardiovascular : Heart rate normal, rhythm is  regular, no rubs or gallops, no peripheral edema, peripheral pulses palpated   7. Gastrointestinal:  Abdomen is soft, nondistended, nontender to palpation bowel sounds active, no masses or organomegaly palpated   8. Skin:  Skin is warm, dry.  There is a an abrasion on the right knee   9.Musculoskeletal:   no asymmetry in tone, no peripheral edema, peripheral pulses palpated, no tenderness to palpation in the extremities  Data Reviewed: In the ED Temp 97.5-97.6, heart rate 53-61, respiratory rate 16-18, blood pressure 104/59-131/77, satting 94-98% No leukocytosis, hemoglobin stable 11.1 Hypokalemic at 3.3 UA borderline, urine culture sent.  No antibiotics started as ID has recommended  no antibiotics at this time.  CT C-spine findings and CT head findings earlier listed in the HPI Chest x-ray shows atypical focal opacity in the left lung base Admission requested for osteomyelitis which transferred down to [facility] per ID  Assessment and Plan: * Osteomyelitis (HCC) - ID consulted from ER and recommends no antibiotics at this time - IR recommends admission to [facility] for IR biopsy - MRI shows findings consistent with discitis osteomyelitis at C7-T1.  No evidence of epidural abscess.  Other chronic findings - Continue to monitor  Sundowning - Patient from SNF memory care unit - History of sundowning - Continue Depakote and Klonopin  Hypokalemia - Replace and recheck  Type 2 diabetes mellitus without complication, without long-term current use of insulin (HCC) - Holding home hypoglycemics - Sliding scale coverage - CBG monitoring - Last hemoglobin A1c was in 2017  TIA (transient ischemic attack) - Holding aspirin and Plavix for possible IR procedure - Continue statin medication - Continue to monitor blood pressure  Hyperlipidemia with target LDL less than 100 - Continue statin      Advance Care Planning:   Code Status: Limited: Do not attempt resuscitation (DNR)  -DNR-LIMITED -Do Not Intubate/DNI   Consults: Disease and IR  Family Communication: Daughter at bedside  Severity of Illness: The appropriate patient status for this patient is INPATIENT. Inpatient status is judged to be reasonable and necessary in order to provide the required intensity of service to ensure the patient's safety. The patient's presenting symptoms, physical exam findings, and initial radiographic and laboratory data in the context of their chronic comorbidities is felt to place them at high risk for further clinical deterioration. Furthermore, it is not anticipated that the patient will be medically stable for discharge from the hospital within 2 midnights of admission.   * I certify that at the point of admission it is my clinical judgment that the patient will require inpatient hospital care spanning beyond 2 midnights from the point of admission due to high intensity of service, high risk for further deterioration and high frequency of surveillance required.*  Author: Lilyan Gilford, DO 09/23/2023 8:26 PM  For on call review [email or web address].

## 2023-09-23 NOTE — ED Triage Notes (Signed)
Pt had fall last night. Staff at [facility] reported to EMS that MD instructed staff not to send to hospital. 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 Pt complains of back and leg pain. Pt confused and tearful.

## 2023-09-23 NOTE — Progress Notes (Signed)
Pt is confused, anxious, and agitated. Pt does not follow simple commands. She pulled one of her IVs out, SCDs, and Purewick. Multiple interventions were taken to redirect pt but all attempts failed. Pt is on Eliquis and high risk of bleeding. Safety mittens were applied to enhance pt's safety. We will continue to assess

## 2023-09-23 NOTE — ED Provider Notes (Signed)
Care transferred to me.  MRI showing concern for osteomyelitis of her cervical spine.  No current fevers.  No focal weakness on exam.  She has been wheelchair-bound since her stroke. Discussed with ID on call, Dr. Daiva Eves.  Advises admission but to hold on antibiotics as long as she is not clinically unwell.  Questionable pneumonia on x-ray but this was not found to be the case on CT.  Has a borderline UTI but it would be best to hold off on antibiotics for now.  She will need transfer to [facility] and admission and possible IR aspiration.  Can consider thoracic spine MRI given the T1 findings.  Discussed with Dr. Carren Rang for admission.   Pricilla Loveless, MD 09/23/23 [phone]

## 2023-09-23 NOTE — Assessment & Plan Note (Signed)
-   Holding home hypoglycemics - Sliding scale coverage - CBG monitoring - Last hemoglobin A1c was in 2017

## 2023-09-23 NOTE — Assessment & Plan Note (Signed)
Continue statin. 

## 2023-09-23 NOTE — Assessment & Plan Note (Signed)
-   Patient from SNF memory care unit - History of sundowning - Continue Depakote and Klonopin

## 2023-09-23 NOTE — ED Notes (Signed)
Pt crying and states knee hurts. [employer] notified. Daughter at bedside.

## 2023-09-23 NOTE — ED Notes (Signed)
Patient transported to MRI 

## 2023-09-23 NOTE — Assessment & Plan Note (Signed)
Replace and recheck

## 2023-09-23 NOTE — Assessment & Plan Note (Addendum)
-   Holding aspirin and Plavix for possible IR procedure - Continue statin medication - Continue to monitor blood pressure

## 2023-09-23 NOTE — ED Notes (Signed)
Patient transported to CT 

## 2023-09-23 NOTE — ED Provider Notes (Addendum)
[place] EMERGENCY DEPARTMENT AT [facility] Provider Note   CSN: [id number] Arrival date & time: 09/23/23  1041     History  Chief Complaint  Patient presents with   Fall    Fall last night    Katelyn Ballard is a 78 y.o. female.  Level 5 caveat secondary to dementia.  Patient had a fall yesterday at her facility.  Today they said she was acting differently, apparently trying to feed other residents.  Patient complains of pain in her back and her right knee.  Patient unable to give any further history.  She is not on a blood thinner.  The history is provided by the patient and the EMS personnel.  Fall This is a new problem. The current episode started yesterday. The problem has not changed since onset.She has tried rest for the symptoms. The treatment provided no relief.       Home Medications Prior to Admission medications   Medication Sig Start Date End Date Taking? Authorizing Provider  allopurinol (ZYLOPRIM) 300 MG tablet Take 1 tablet (300 mg total) by mouth daily. 08/24/21   Daphine Deutscher, Mary-Margaret, FNP  amLODipine (NORVASC) 10 MG tablet Take 1 tablet (10 mg total) by mouth daily. 08/24/21   Daphine Deutscher Mary-Margaret, FNP  blood glucose meter kit and supplies Check blood sugar bid and as needed Dx: E11.42 03/11/20   Bennie Pierini, FNP  blood glucose meter kit and supplies Dispense based on patient and insurance preference. Use up to four times daily as directed. (FOR ICD-10 E10.9, E11.9). 04/18/21   Daphine Deutscher, Mary-Margaret, FNP  fenofibrate 160 MG tablet Take 1 tablet (160 mg total) by mouth daily. (Needs to be seen before next refill) 08/24/21   Bennie Pierini, FNP  glimepiride (AMARYL) 4 MG tablet Take 2 tablets (8 mg total) by mouth daily before breakfast. 08/24/21   Daphine Deutscher, Mary-Margaret, FNP  glucose blood (ONETOUCH ULTRA) test strip USE 1 STRIP TO CHECK GLUCOSE 2 TO 3 TIMES DAILY 03/30/21   Daphine Deutscher, Mary-Margaret, FNP  lisinopril (ZESTRIL) 40 MG tablet Take  1 tablet (40 mg total) by mouth daily. (Needs to be seen before next refill) 08/24/21   Bennie Pierini, FNP  metFORMIN (GLUCOPHAGE) 1000 MG tablet Take 1 tablet (1,000 mg total) by mouth 2 (two) times daily with a meal. (NEEDS TO BE SEEN BEFORE NEXT REFILL) 03/19/22   Bennie Pierini, FNP  rosuvastatin (CRESTOR) 10 MG tablet Take 1 tablet (10 mg total) by mouth daily. 08/24/21   Bennie Pierini, FNP      Allergies    Atorvastatin, Shellfish allergy, and Penicillins    Review of Systems   Review of Systems  Unable to perform ROS: Dementia    Physical Exam Updated Vital Signs BP 131/65 (BP Location: Right Arm)   Pulse (!) 59   Temp (!) 97.5 F (36.4 C) (Oral)   Resp 16   Ht 5\' 3"  (1.6 m)   SpO2 95%   BMI 27.46 kg/m  Physical Exam Vitals and nursing note reviewed.  Constitutional:      General: She is not in acute distress.    Appearance: Normal appearance. She is well-developed.  HENT:     Head: Normocephalic.     Comments: Patient has a large bruise to her right forehead that is now extending down over her superior orbital ridge Eyes:     Conjunctiva/sclera: Conjunctivae normal.  Cardiovascular:     Rate and Rhythm: Normal rate and regular rhythm.     Heart sounds:  No murmur heard. Pulmonary:     Effort: Pulmonary effort is normal. No respiratory distress.     Breath sounds: Normal breath sounds.  Abdominal:     Palpations: Abdomen is soft.     Tenderness: There is no abdominal tenderness. There is no guarding or rebound.  Musculoskeletal:        General: Tenderness present. Normal range of motion.     Cervical back: Neck supple. No tenderness.     Comments: Patient has some tenderness and abrasions to her right patellar area.  Full range of motion of upper and lower extremities without any significant limitations  Skin:    General: Skin is warm and dry.     Capillary Refill: Capillary refill takes less than 2 seconds.  Neurological:     General: No  focal deficit present.     Mental Status: She is alert. She is disoriented.     Cranial Nerves: No cranial nerve deficit.     Sensory: No sensory deficit.     Motor: No weakness.     ED Results / Procedures / Treatments   Labs (all labs ordered are listed, but only abnormal results are displayed) Labs Reviewed  BASIC METABOLIC PANEL - Abnormal; Notable for the following components:      Result Value   Potassium 3.3 (*)    Glucose, Bld 147 (*)    BUN 25 (*)    Creatinine, Ser 1.08 (*)    Calcium 8.4 (*)    GFR, Estimated 52 (*)    All other components within normal limits  CBC WITH DIFFERENTIAL/PLATELET - Abnormal; Notable for the following components:   Hemoglobin 11.1 (*)    MCHC 29.6 (*)    RDW 18.0 (*)    All other components within normal limits  URINALYSIS, ROUTINE W REFLEX MICROSCOPIC - Abnormal; Notable for the following components:   Color, Urine STRAW (*)    Hgb urine dipstick LARGE (*)    Leukocytes,Ua TRACE (*)    Bacteria, UA RARE (*)    All other components within normal limits    EKG None  Radiology MR Cervical Spine W and Wo Contrast  Result Date: 09/23/2023 CLINICAL DATA:  Osteomyelitis cervical. EXAM: MRI CERVICAL SPINE WITHOUT AND WITH CONTRAST TECHNIQUE: Multiplanar and multiecho pulse sequences of the cervical spine, to include the craniocervical junction and cervicothoracic junction, were obtained without and with intravenous contrast. CONTRAST:  7mL GADAVIST GADOBUTROL 1 MMOL/ML IV SOLN COMPARISON:  CT cervical spine September 23, 2023. FINDINGS: The study is partially degraded by motion. Alignment: Trace anterolisthesis of C7 over T1 and T1 over T2. Vertebrae: Endplate erosion at C7-T1, correlating with findings of recent CT. There is associated marrow edema and contrast enhancement of the C7 and T1 vertebral bodies as well as mild prevertebral soft tissue edema and enhancement. No evidence of epidural abscess. Cord: Grossly unremarkable. Posterior  Fossa, vertebral arteries, paraspinal tissues: No additional relevant findings. Disc levels: C2-3: No significant spinal canal or neural foraminal stenosis. C3-4: Posterior disc osteophyte complex without significant spinal canal stenosis. Uncovertebral and facet degenerative changes resulting in severe bilateral neural foraminal narrowing. C4-5: Posterior disc osteophyte complex without significant spinal canal stenosis. Uncovertebral and facet degenerative changes [place] moderate bilateral neural foraminal narrowing. C5-6: Posterior disc osteophyte complex resulting in mild spinal canal stenosis. Uncovertebral and facet degenerative change resulting mild right and severe left neural foraminal narrowing. C6-7: Posterior disc osteophyte complex resulting in mild spinal canal stenosis. Uncovertebral and facet degenerative changes Ult  in moderate right and severe left neural foraminal narrowing. C7-T1: Small posterior disc osteophyte complex without significant spinal canal stenosis. Uncovertebral and facet degenerative changes resulting in severe right and mild left neural foraminal narrowing. IMPRESSION: 1. Findings consistent with discitis/osteomyelitis at C7-T1. No evidence of epidural abscess. 2. Degenerative changes of the cervical spine with multilevel high-grade neural foraminal narrowing, as described above. 3. Mild spinal canal stenosis at C5-6 and C6-7. Electronically Signed   By: Baldemar Lenis M.D.   On: 09/23/2023 16:41   DG Chest 1 View  Result Date: 09/23/2023 CLINICAL DATA:  Back and leg pain status post fall last night. Confusion. EXAM: RIGHT KNEE - COMPLETE 4+ VIEW; CHEST 1 VIEW; PELVIS - 1-2 VIEW; LUMBAR SPINE - COMPLETE 4+ VIEW COMPARISON:  Chest radiograph 09/08/2019 FINDINGS: Chest: Heart size at upper limits of normal. No pulmonary vascular congestion. New airspace opacity seen in the left inferolateral lung. Lungs otherwise clear. Pelvis: No fracture or dislocation. Lumbar  spine: Extensive atherosclerotic changes seen throughout visualized arterial segments. Grade 1 anterolisthesis of L5 on S1. Vertebral body heights otherwise maintained. Mild disc height loss at L3-L4, L4-L5, L5-S1. Endplate spurring seen throughout the lumbar spine, greatest at L1-L2. Mild-to-moderate facet degenerative changes also present throughout the lumbar spine, greatest at L4-L5. Calcifications seen in the upper abdomen bilaterally, possibly located within the kidneys or renal pelvis, measures up to 7 mm on the right and 4 mm on the left. Right knee: Diffuse osteopenia. No fracture or dislocation. Mild thickening of the patellar tendon. IMPRESSION: 1. No acute fracture or dislocation. 2. Atypical focal opacity of the left inferolateral lung base requires further evaluation with contrast enhanced chest CT. This may be due to infection, atelectasis, or mass. 3. No acute abnormality of the lumbar spine are pelvis. 4. Diffuse atherosclerotic calcifications of the abdominal aorta with focal dilatation measuring 2.8 cm. Further evaluation with nonemergent abdominal aorta ultrasound is recommended. 5. Mild thickening of the patellar tendon may be due to chronic tendinosis or recent injury. Electronically Signed   By: Acquanetta Belling M.D.   On: 09/23/2023 13:27   DG Lumbar Spine Complete  Result Date: 09/23/2023 CLINICAL DATA:  Back and leg pain status post fall last night. Confusion. EXAM: RIGHT KNEE - COMPLETE 4+ VIEW; CHEST 1 VIEW; PELVIS - 1-2 VIEW; LUMBAR SPINE - COMPLETE 4+ VIEW COMPARISON:  Chest radiograph 09/08/2019 FINDINGS: Chest: Heart size at upper limits of normal. No pulmonary vascular congestion. New airspace opacity seen in the left inferolateral lung. Lungs otherwise clear. Pelvis: No fracture or dislocation. Lumbar spine: Extensive atherosclerotic changes seen throughout visualized arterial segments. Grade 1 anterolisthesis of L5 on S1. Vertebral body heights otherwise maintained. Mild disc  height loss at L3-L4, L4-L5, L5-S1. Endplate spurring seen throughout the lumbar spine, greatest at L1-L2. Mild-to-moderate facet degenerative changes also present throughout the lumbar spine, greatest at L4-L5. Calcifications seen in the upper abdomen bilaterally, possibly located within the kidneys or renal pelvis, measures up to 7 mm on the right and 4 mm on the left. Right knee: Diffuse osteopenia. No fracture or dislocation. Mild thickening of the patellar tendon. IMPRESSION: 1. No acute fracture or dislocation. 2. Atypical focal opacity of the left inferolateral lung base requires further evaluation with contrast enhanced chest CT. This may be due to infection, atelectasis, or mass. 3. No acute abnormality of the lumbar spine are pelvis. 4. Diffuse atherosclerotic calcifications of the abdominal aorta with focal dilatation measuring 2.8 cm. Further evaluation with nonemergent abdominal aorta  ultrasound is recommended. 5. Mild thickening of the patellar tendon may be due to chronic tendinosis or recent injury. Electronically Signed   By: Acquanetta Belling M.D.   On: 09/23/2023 13:27   DG Knee Complete 4 Views Right  Result Date: 09/23/2023 CLINICAL DATA:  Back and leg pain status post fall last night. Confusion. EXAM: RIGHT KNEE - COMPLETE 4+ VIEW; CHEST 1 VIEW; PELVIS - 1-2 VIEW; LUMBAR SPINE - COMPLETE 4+ VIEW COMPARISON:  Chest radiograph 09/08/2019 FINDINGS: Chest: Heart size at upper limits of normal. No pulmonary vascular congestion. New airspace opacity seen in the left inferolateral lung. Lungs otherwise clear. Pelvis: No fracture or dislocation. Lumbar spine: Extensive atherosclerotic changes seen throughout visualized arterial segments. Grade 1 anterolisthesis of L5 on S1. Vertebral body heights otherwise maintained. Mild disc height loss at L3-L4, L4-L5, L5-S1. Endplate spurring seen throughout the lumbar spine, greatest at L1-L2. Mild-to-moderate facet degenerative changes also present throughout the  lumbar spine, greatest at L4-L5. Calcifications seen in the upper abdomen bilaterally, possibly located within the kidneys or renal pelvis, measures up to 7 mm on the right and 4 mm on the left. Right knee: Diffuse osteopenia. No fracture or dislocation. Mild thickening of the patellar tendon. IMPRESSION: 1. No acute fracture or dislocation. 2. Atypical focal opacity of the left inferolateral lung base requires further evaluation with contrast enhanced chest CT. This may be due to infection, atelectasis, or mass. 3. No acute abnormality of the lumbar spine are pelvis. 4. Diffuse atherosclerotic calcifications of the abdominal aorta with focal dilatation measuring 2.8 cm. Further evaluation with nonemergent abdominal aorta ultrasound is recommended. 5. Mild thickening of the patellar tendon may be due to chronic tendinosis or recent injury. Electronically Signed   By: Acquanetta Belling M.D.   On: 09/23/2023 13:27   DG Pelvis 1-2 Views  Result Date: 09/23/2023 CLINICAL DATA:  Back and leg pain status post fall last night. Confusion. EXAM: RIGHT KNEE - COMPLETE 4+ VIEW; CHEST 1 VIEW; PELVIS - 1-2 VIEW; LUMBAR SPINE - COMPLETE 4+ VIEW COMPARISON:  Chest radiograph 09/08/2019 FINDINGS: Chest: Heart size at upper limits of normal. No pulmonary vascular congestion. New airspace opacity seen in the left inferolateral lung. Lungs otherwise clear. Pelvis: No fracture or dislocation. Lumbar spine: Extensive atherosclerotic changes seen throughout visualized arterial segments. Grade 1 anterolisthesis of L5 on S1. Vertebral body heights otherwise maintained. Mild disc height loss at L3-L4, L4-L5, L5-S1. Endplate spurring seen throughout the lumbar spine, greatest at L1-L2. Mild-to-moderate facet degenerative changes also present throughout the lumbar spine, greatest at L4-L5. Calcifications seen in the upper abdomen bilaterally, possibly located within the kidneys or renal pelvis, measures up to 7 mm on the right and 4 mm on the  left. Right knee: Diffuse osteopenia. No fracture or dislocation. Mild thickening of the patellar tendon. IMPRESSION: 1. No acute fracture or dislocation. 2. Atypical focal opacity of the left inferolateral lung base requires further evaluation with contrast enhanced chest CT. This may be due to infection, atelectasis, or mass. 3. No acute abnormality of the lumbar spine are pelvis. 4. Diffuse atherosclerotic calcifications of the abdominal aorta with focal dilatation measuring 2.8 cm. Further evaluation with nonemergent abdominal aorta ultrasound is recommended. 5. Mild thickening of the patellar tendon may be due to chronic tendinosis or recent injury. Electronically Signed   By: Acquanetta Belling M.D.   On: 09/23/2023 13:27   CT Head Wo Contrast  Result Date: 09/23/2023 CLINICAL DATA:  Head trauma, minor (Age >= 65y); Neck trauma (  Age >= 65y). Fall with bruising to right forehead, posterior head and neck pain. EXAM: CT HEAD WITHOUT CONTRAST CT CERVICAL SPINE WITHOUT CONTRAST TECHNIQUE: Multidetector CT imaging of the head and cervical spine was performed following the standard protocol without intravenous contrast. Multiplanar CT image reconstructions of the cervical spine were also generated. RADIATION DOSE REDUCTION: This exam was performed according to the departmental dose-optimization program which includes automated exposure control, adjustment of the mA and/or kV according to patient size and/or use of iterative reconstruction technique. COMPARISON:  CT head and cervical spine 05/12/2023. FINDINGS: CT HEAD FINDINGS Brain: No acute hemorrhage. Unchanged moderate chronic small-vessel disease. Cortical gray-white differentiation is otherwise preserved. Prominence of the ventricles and sulci within expected range for age. No hydrocephalus or extra-axial collection. No mass effect or midline shift. Vascular: No hyperdense vessel or unexpected calcification. Skull: No calvarial fracture or suspicious bone lesion.  Skull base is unremarkable. Sinuses/Orbits: No acute finding. Other: Right frontal scalp hematoma. CT CERVICAL SPINE FINDINGS Alignment: Normal. Skull base and vertebrae: No acute fracture. Normal craniocervical junction. Progressive endplate erosions at C7-T1, suspicious for discitis-osteomyelitis versus severe degenerative change. Soft tissues and spinal canal: No prevertebral fluid or swelling. No visible canal hematoma. Disc levels: Multilevel cervical spondylosis, worst at C5-6, where there is at least mild spinal canal stenosis. Upper chest: No acute findings. Other: None. IMPRESSION: 1. No acute intracranial abnormality. Right frontal scalp hematoma. 2. No acute cervical spine fracture or traumatic listhesis. 3. Progressive endplate erosions at C7-T1, suspicious for discitis-osteomyelitis versus severe degenerative change. Consider MRI of the cervical spine with and without contrast for further evaluation. Electronically Signed   By: Orvan Falconer M.D.   On: 09/23/2023 13:03   CT Cervical Spine Wo Contrast  Result Date: 09/23/2023 CLINICAL DATA:  Head trauma, minor (Age >= 65y); Neck trauma (Age >= 65y). Fall with bruising to right forehead, posterior head and neck pain. EXAM: CT HEAD WITHOUT CONTRAST CT CERVICAL SPINE WITHOUT CONTRAST TECHNIQUE: Multidetector CT imaging of the head and cervical spine was performed following the standard protocol without intravenous contrast. Multiplanar CT image reconstructions of the cervical spine were also generated. RADIATION DOSE REDUCTION: This exam was performed according to the departmental dose-optimization program which includes automated exposure control, adjustment of the mA and/or kV according to patient size and/or use of iterative reconstruction technique. COMPARISON:  CT head and cervical spine 05/12/2023. FINDINGS: CT HEAD FINDINGS Brain: No acute hemorrhage. Unchanged moderate chronic small-vessel disease. Cortical gray-white differentiation is  otherwise preserved. Prominence of the ventricles and sulci within expected range for age. No hydrocephalus or extra-axial collection. No mass effect or midline shift. Vascular: No hyperdense vessel or unexpected calcification. Skull: No calvarial fracture or suspicious bone lesion. Skull base is unremarkable. Sinuses/Orbits: No acute finding. Other: Right frontal scalp hematoma. CT CERVICAL SPINE FINDINGS Alignment: Normal. Skull base and vertebrae: No acute fracture. Normal craniocervical junction. Progressive endplate erosions at C7-T1, suspicious for discitis-osteomyelitis versus severe degenerative change. Soft tissues and spinal canal: No prevertebral fluid or swelling. No visible canal hematoma. Disc levels: Multilevel cervical spondylosis, worst at C5-6, where there is at least mild spinal canal stenosis. Upper chest: No acute findings. Other: None. IMPRESSION: 1. No acute intracranial abnormality. Right frontal scalp hematoma. 2. No acute cervical spine fracture or traumatic listhesis. 3. Progressive endplate erosions at C7-T1, suspicious for discitis-osteomyelitis versus severe degenerative change. Consider MRI of the cervical spine with and without contrast for further evaluation. Electronically Signed   By: Elwyn Reach.D.  On: 09/23/2023 13:03    Procedures Procedures    Medications Ordered in ED Medications  gadobutrol (GADAVIST) 1 MMOL/ML injection 7 mL (7 mLs Intravenous Contrast Given 09/23/23 1515)  iohexol (OMNIPAQUE) 300 MG/ML solution 75 mL (75 mLs Intravenous Contrast Given 09/23/23 1632)    ED Course/ Medical Decision Making/ A&P Clinical Course as of 09/23/23 1706  Mon Sep 23, 2023  1433 Updated patient's daughter who is at bedside. [MB]    Clinical Course User Index [MB] Terrilee Files, MD                                 Medical Decision Making Amount and/or Complexity of Data Reviewed Labs: ordered. Radiology: ordered.  Risk OTC drugs. Prescription drug  management. Decision regarding hospitalization.   This patient complains of fall head injury neck and back pain, right knee pain; this involves an extensive number of treatment Options and is a complaint that carries with it a high risk of complications and morbidity. The differential includes contusion, fracture, dislocation, dehydration, metabolic derangement  I ordered, reviewed and interpreted labs, which included CBC with normal white count stable low hemoglobin, chemistries with low potassium elevated BUN and creatinine, urinalysis possible signs of infection I ordered imaging studies which included CT head and cervical spine, x-rays of chest lumbar spine knee pelvis and I independently    visualized and interpreted imaging which showed degenerative changes versus discitis on CT, possible opacity on chest x-ray.  Further imaging ordered Additional history obtained from patient's daughter Previous records obtained and reviewed in epic, in the ED in May for a fall Cardiac monitoring reviewed, sinus bradycardia Social determinants considered, no significant barriers Critical Interventions: None  After the interventions stated above, I reevaluated the patient and found patient to be neurologically intact in no distress Admission and further testing considered, her care was signed out to Dr. Criss Alvine to follow-up on results of imaging.  Final disposition based on imaging although if no acute findings can likely return back to her facility.         Final Clinical Impression(s) / ED Diagnoses Final diagnoses:  Fall, initial encounter  Injury of head, initial encounter  Contusion of right knee, initial encounter    Rx / DC Orders ED Discharge Orders     None         Terrilee Files, MD 09/23/23 1710    Terrilee Files, MD 09/23/23 2036

## 2023-09-23 NOTE — Assessment & Plan Note (Signed)
-   ID consulted from ER and recommends no antibiotics at this time - IR recommends admission to [facility] for IR biopsy - MRI shows findings consistent with discitis osteomyelitis at C7-T1.  No evidence of epidural abscess.  Other chronic findings - Continue to monitor

## 2023-09-24 ENCOUNTER — Other Ambulatory Visit: Payer: Self-pay

## 2023-09-24 DIAGNOSIS — M869 Osteomyelitis, unspecified: Secondary | ICD-10-CM | POA: Diagnosis not present

## 2023-09-24 DIAGNOSIS — E785 Hyperlipidemia, unspecified: Secondary | ICD-10-CM | POA: Diagnosis not present

## 2023-09-24 DIAGNOSIS — E876 Hypokalemia: Secondary | ICD-10-CM | POA: Diagnosis not present

## 2023-09-24 DIAGNOSIS — G459 Transient cerebral ischemic attack, unspecified: Secondary | ICD-10-CM | POA: Diagnosis not present

## 2023-09-24 LAB — CBC WITH DIFFERENTIAL/PLATELET
Abs Immature Granulocytes: 0.02 10*3/uL (ref 0.00–0.07)
Basophils Absolute: 0 10*3/uL (ref 0.0–0.1)
Basophils Relative: 1 %
Eosinophils Absolute: 0.2 10*3/uL (ref 0.0–0.5)
Eosinophils Relative: 3 %
HCT: 36.2 % (ref 36.0–46.0)
Hemoglobin: 10.9 g/dL — ABNORMAL LOW (ref 12.0–15.0)
Immature Granulocytes: 0 %
Lymphocytes Relative: 24 %
Lymphs Abs: 1.7 10*3/uL (ref 0.7–4.0)
MCH: 26.8 pg (ref 26.0–34.0)
MCHC: 30.1 g/dL (ref 30.0–36.0)
MCV: 88.9 fL (ref 80.0–100.0)
Monocytes Absolute: 0.8 10*3/uL (ref 0.1–1.0)
Monocytes Relative: 11 %
Neutro Abs: 4.2 10*3/uL (ref 1.7–7.7)
Neutrophils Relative %: 61 %
Platelets: 194 10*3/uL (ref 150–400)
RBC: 4.07 MIL/uL (ref 3.87–5.11)
RDW: 18.4 % — ABNORMAL HIGH (ref 11.5–15.5)
WBC: 7 10*3/uL (ref 4.0–10.5)
nRBC: 0 % (ref 0.0–0.2)

## 2023-09-24 LAB — COMPREHENSIVE METABOLIC PANEL
ALT: 10 U/L (ref 0–44)
AST: 8 U/L — ABNORMAL LOW (ref 15–41)
Albumin: 2.2 g/dL — ABNORMAL LOW (ref 3.5–5.0)
Alkaline Phosphatase: 100 U/L (ref 38–126)
Anion gap: 8 (ref 5–15)
BUN: 21 mg/dL (ref 8–23)
CO2: 29 mmol/L (ref 22–32)
Calcium: 8.8 mg/dL — ABNORMAL LOW (ref 8.9–10.3)
Chloride: 106 mmol/L (ref 98–111)
Creatinine, Ser: 1.04 mg/dL — ABNORMAL HIGH (ref 0.44–1.00)
GFR, Estimated: 55 mL/min — ABNORMAL LOW (ref >60)
Glucose, Bld: 160 mg/dL — ABNORMAL HIGH (ref 70–99)
Potassium: 3.7 mmol/L (ref 3.5–5.1)
Sodium: 143 mmol/L (ref 135–145)
Total Bilirubin: 0.2 mg/dL — ABNORMAL LOW (ref 0.3–1.2)
Total Protein: 6.4 g/dL — ABNORMAL LOW (ref 6.5–8.1)

## 2023-09-24 LAB — GLUCOSE, CAPILLARY
Glucose-Capillary: 107 mg/dL — ABNORMAL HIGH (ref 70–99)
Glucose-Capillary: 122 mg/dL — ABNORMAL HIGH (ref 70–99)
Glucose-Capillary: 146 mg/dL — ABNORMAL HIGH (ref 70–99)
Glucose-Capillary: 117 mg/dL — ABNORMAL HIGH (ref 70–99)

## 2023-09-24 LAB — VALPROIC ACID LEVEL: Valproic Acid Lvl: 20 ug/mL — ABNORMAL LOW (ref 50.0–100.0)

## 2023-09-24 LAB — MAGNESIUM: Magnesium: 2 mg/dL (ref 1.7–2.4)

## 2023-09-24 LAB — HEMOGLOBIN A1C
Hgb A1c MFr Bld: 6.4 % — ABNORMAL HIGH (ref 4.8–5.6)
Mean Plasma Glucose: 136.98 mg/dL

## 2023-09-24 MED ORDER — MORPHINE SULFATE (PF) 2 MG/ML IV SOLN
2.0000 mg | INTRAVENOUS | Status: AC
  Filled 2023-09-24: qty 1

## 2023-09-24 MED ORDER — DIVALPROEX SODIUM 125 MG PO CSDR
375.0000 mg | DELAYED_RELEASE_CAPSULE | Freq: Three times a day (TID) | ORAL | Status: DC
  Administered 2023-09-24 – 2023-09-26 (×6): 375 mg via ORAL
  Filled 2023-09-24 (×6): qty 3

## 2023-09-24 NOTE — Progress Notes (Signed)
[facility]                                                                               Katelyn Ballard, is a 78 y.o. female, DOB - 1945-08-24, HQI:[id number] Admit date - 09/23/2023    Outpatient Primary MD for the patient is Daphine Deutscher, Mary-Margaret, FNP  LOS - 1  days    Brief summary   78 y.o. female with medical history significant of diabetes mellitus type 2, hyperlipidemia, hypertension, CVA, and more presents the ER with a chief complaint of fall.  Patient is wheelchair-bound and does not walk. It is unclear what her mechanism of her fall was. She does have bruising on her forehead that makes it look like she took a nose dive out of bed.   In the ER patient had no acute fracture in the C-spine but she did have progressive erosions at C7 and T1. MRI confirmed osteomyelitis/discitis. Infectious disease was consulted and recommended no antibiotics at this time, transfer to [facility], IR biopsy of affected area. CT head showed no acute intracranial abnormality. It did show a frontal scalp hematoma. Chest x-ray showed an atypical focal opacity in the left lung base.    Assessment & Plan    Assessment and Plan: * Osteomyelitis (HCC)  ID consulted from ER and recommends no antibiotics at this time MRI shows findings consistent with discitis osteomyelitis at C7-T1.  No evidence of epidural abscess.  IR consulted, and suggested Given the likelihood of a low yield for culture, risks of the procedure outweigh potential benefits. NIR team recommends empirical treatment with antibiotics and repeating imaging in approximately 4 weeks.  Further recommendations from ID to follow.  Therapy evaluations pending.  Pain control.   Sundowning Patient from SNF memory care unit  History of sundowning Continue Depakote and Klonopin  Hypokalemia Replaced and repeat level later today.   Type 2 diabetes mellitus without complication, without long-term current use of insulin (HCC) CBG  (last 3)  Recent Labs    09/23/23 2140 09/24/23 0825 09/24/23 1107  GLUCAP 167* 107* 122*   Resume SSI, get A1c.   TIA (transient ischemic attack) Restart aspirin, plavix and statin on discharge.   Hyperlipidemia with target LDL less than 100 - Continue statin   Dementia:  No agitation today.       Estimated body mass index is 27.46 kg/m as calculated from the following:   Height as of this encounter: 5\' 3"  (1.6 m).   Weight as of 05/12/23: 70.3 kg.  Code Status: DNR LIMITED DVT Prophylaxis:  SCDs Start: 09/23/23 2111   Level of Care: Level of care: Med-Surg Family Communication: DAUGHTER AT BEDSIDE  Disposition Plan:     Remains inpatient appropriate:  awaiting recommendations from ID.   Procedures:    Consultants:   IR.  ID  Antimicrobials:   Anti-infectives (From admission, onward)    None        Medications  Scheduled Meds:  clonazePAM  0.5 mg Oral QHS   divalproex  125 mg Oral Q12H   escitalopram  5 mg Oral QHS   insulin aspart  0-15 Units Subcutaneous TID WC   insulin aspart  0-5 Units Subcutaneous QHS   metoprolol tartrate  12.5 mg Oral BID    morphine injection  2 mg Intravenous STAT   rosuvastatin  10 mg Oral Daily   Continuous Infusions: PRN Meds:.acetaminophen **OR** acetaminophen, ondansetron **OR** ondansetron (ZOFRAN) IV, oxyCODONE    Subjective:   Katelyn Ballard was seen and examined today. Pain is controlled.   Objective:   Vitals:   09/23/23 1730 09/23/23 2054 09/24/23 0422 09/24/23 0820  BP: 129/75 (!) 149/67 (!) 146/62 (!) 143/65  Pulse: 61 (!) 57 (!) 56 (!) 52  Resp:  20 18 18   Temp:  98.2 F (36.8 C) 98.1 F (36.7 C) 98.6 F (37 C)  TempSrc:  Oral Oral Oral  SpO2: 97% 96% 95% 93%  Height:        Intake/Output Summary (Last 24 hours) at 09/24/2023 1242 Last data filed at 09/24/2023 0600 Gross per 24 hour  Intake --  Output 350 ml  Net -350 ml   There were no vitals filed for this  visit.   Exam General exam: ill appearing lady, with bruising over the right forehead and around the eye. Respiratory system: Clear to auscultation. Respiratory effort normal. Cardiovascular system: S1 & S2 heard, RRR. No JVD,  Gastrointestinal system: Abdomen is nondistended, soft and nontender.  Central nervous system: Alert and oriented to person and place.  Extremities: Symmetric 5 x 5 power. Skin: No rashes,  Psychiatry: mood is okay.     Data Reviewed:  I have personally reviewed following labs and imaging studies   CBC Lab Results  Component Value Date   WBC 8.2 09/23/2023   RBC 4.21 09/23/2023   HGB 11.1 (L) 09/23/2023   HCT 37.5 09/23/2023   MCV 89.1 09/23/2023   MCH 26.4 09/23/2023   PLT 184 09/23/2023   MCHC 29.6 (L) 09/23/2023   RDW 18.0 (H) 09/23/2023   LYMPHSABS 2.6 09/23/2023   MONOABS 0.8 09/23/2023   EOSABS 0.3 09/23/2023   BASOSABS 0.1 09/23/2023     Last metabolic panel Lab Results  Component Value Date   NA 142 09/23/2023   K 3.3 (L) 09/23/2023   CL 103 09/23/2023   CO2 27 09/23/2023   BUN 25 (H) 09/23/2023   CREATININE 1.08 (H) 09/23/2023   GLUCOSE 147 (H) 09/23/2023   GFRNONAA 52 (L) 09/23/2023   GFRAA 60 12/05/2020   CALCIUM 8.4 (L) 09/23/2023   PROT 7.3 08/24/2021   ALBUMIN 4.0 08/24/2021   LABGLOB 3.3 08/24/2021   AGRATIO 1.2 08/24/2021   BILITOT <0.2 08/24/2021   ALKPHOS 94 08/24/2021   AST 9 08/24/2021   ALT 14 08/24/2021   ANIONGAP 12 09/23/2023    CBG (last 3)  Recent Labs    09/23/23 2140 09/24/23 0825 09/24/23 1107  GLUCAP 167* 107* 122*      Coagulation Profile: No results for input(s): "INR", "PROTIME" in the last 168 hours.   Radiology Studies: CT Chest W Contrast  Result Date: 09/23/2023 CLINICAL DATA:  Pneumonia, complication suspected, xray done. Recent fall EXAM: CT CHEST WITH CONTRAST TECHNIQUE: Multidetector CT imaging of the chest was performed during intravenous contrast administration. RADIATION  DOSE REDUCTION: This exam was performed according to the departmental dose-optimization program which includes automated exposure control, adjustment of the mA and/or kV according to patient size and/or use of iterative reconstruction technique. CONTRAST:  75mL OMNIPAQUE IOHEXOL 300 MG/ML  SOLN COMPARISON:  X-ray 09/23/2023 FINDINGS: Cardiovascular: Mild cardiomegaly. No pericardial effusion. Thoracic aorta is nonaneurysmal. Scattered atherosclerotic vascular calcifications of the  aorta and coronary arteries. Central pulmonary vasculature is mildly dilated. Mediastinum/Nodes: No enlarged mediastinal, hilar, or axillary lymph nodes. Thyroid gland, trachea, and esophagus demonstrate no significant findings. Prominent epicardial fat pad on the left (series 4, image 63). Lungs/Pleura: Mild centrilobular emphysema. Mild left basilar subsegmental atelectasis. Lungs are otherwise clear. No pleural effusion or pneumothorax. Upper Abdomen: Cholelithiasis.  No acute abnormality. Musculoskeletal: No chest wall abnormality. No acute or significant osseous findings. Moderate-advanced multilevel thoracic spondylosis. IMPRESSION: 1. Prominent epicardial fat pad on the left corresponding to opacity described on previous chest x-ray. 2. Mild left basilar subsegmental atelectasis. Lungs are otherwise clear. 3. Mild cardiomegaly with mildly dilated central pulmonary vasculature. 4. Cholelithiasis. 5. Aortic and coronary artery atherosclerosis (ICD10-I70.0). 6. Mild emphysema (ICD10-J43.9). Electronically Signed   By: Duanne Guess D.O.   On: 09/23/2023 18:28   MR Cervical Spine W and Wo Contrast  Result Date: 09/23/2023 CLINICAL DATA:  Osteomyelitis cervical. EXAM: MRI CERVICAL SPINE WITHOUT AND WITH CONTRAST TECHNIQUE: Multiplanar and multiecho pulse sequences of the cervical spine, to include the craniocervical junction and cervicothoracic junction, were obtained without and with intravenous contrast. CONTRAST:  7mL GADAVIST  GADOBUTROL 1 MMOL/ML IV SOLN COMPARISON:  CT cervical spine September 23, 2023. FINDINGS: The study is partially degraded by motion. Alignment: Trace anterolisthesis of C7 over T1 and T1 over T2. Vertebrae: Endplate erosion at C7-T1, correlating with findings of recent CT. There is associated marrow edema and contrast enhancement of the C7 and T1 vertebral bodies as well as mild prevertebral soft tissue edema and enhancement. No evidence of epidural abscess. Cord: Grossly unremarkable. Posterior Fossa, vertebral arteries, paraspinal tissues: No additional relevant findings. Disc levels: C2-3: No significant spinal canal or neural foraminal stenosis. C3-4: Posterior disc osteophyte complex without significant spinal canal stenosis. Uncovertebral and facet degenerative changes resulting in severe bilateral neural foraminal narrowing. C4-5: Posterior disc osteophyte complex without significant spinal canal stenosis. Uncovertebral and facet degenerative changes [place] moderate bilateral neural foraminal narrowing. C5-6: Posterior disc osteophyte complex resulting in mild spinal canal stenosis. Uncovertebral and facet degenerative change resulting mild right and severe left neural foraminal narrowing. C6-7: Posterior disc osteophyte complex resulting in mild spinal canal stenosis. Uncovertebral and facet degenerative changes Ult in moderate right and severe left neural foraminal narrowing. C7-T1: Small posterior disc osteophyte complex without significant spinal canal stenosis. Uncovertebral and facet degenerative changes resulting in severe right and mild left neural foraminal narrowing. IMPRESSION: 1. Findings consistent with discitis/osteomyelitis at C7-T1. No evidence of epidural abscess. 2. Degenerative changes of the cervical spine with multilevel high-grade neural foraminal narrowing, as described above. 3. Mild spinal canal stenosis at C5-6 and C6-7. Electronically Signed   By: Baldemar Lenis M.D.    On: 09/23/2023 16:41   DG Chest 1 View  Result Date: 09/23/2023 CLINICAL DATA:  Back and leg pain status post fall last night. Confusion. EXAM: RIGHT KNEE - COMPLETE 4+ VIEW; CHEST 1 VIEW; PELVIS - 1-2 VIEW; LUMBAR SPINE - COMPLETE 4+ VIEW COMPARISON:  Chest radiograph 09/08/2019 FINDINGS: Chest: Heart size at upper limits of normal. No pulmonary vascular congestion. New airspace opacity seen in the left inferolateral lung. Lungs otherwise clear. Pelvis: No fracture or dislocation. Lumbar spine: Extensive atherosclerotic changes seen throughout visualized arterial segments. Grade 1 anterolisthesis of L5 on S1. Vertebral body heights otherwise maintained. Mild disc height loss at L3-L4, L4-L5, L5-S1. Endplate spurring seen throughout the lumbar spine, greatest at L1-L2. Mild-to-moderate facet degenerative changes also present throughout the lumbar spine, greatest at  L4-L5. Calcifications seen in the upper abdomen bilaterally, possibly located within the kidneys or renal pelvis, measures up to 7 mm on the right and 4 mm on the left. Right knee: Diffuse osteopenia. No fracture or dislocation. Mild thickening of the patellar tendon. IMPRESSION: 1. No acute fracture or dislocation. 2. Atypical focal opacity of the left inferolateral lung base requires further evaluation with contrast enhanced chest CT. This may be due to infection, atelectasis, or mass. 3. No acute abnormality of the lumbar spine are pelvis. 4. Diffuse atherosclerotic calcifications of the abdominal aorta with focal dilatation measuring 2.8 cm. Further evaluation with nonemergent abdominal aorta ultrasound is recommended. 5. Mild thickening of the patellar tendon may be due to chronic tendinosis or recent injury. Electronically Signed   By: Acquanetta Belling M.D.   On: 09/23/2023 13:27   DG Lumbar Spine Complete  Result Date: 09/23/2023 CLINICAL DATA:  Back and leg pain status post fall last night. Confusion. EXAM: RIGHT KNEE - COMPLETE 4+ VIEW; CHEST  1 VIEW; PELVIS - 1-2 VIEW; LUMBAR SPINE - COMPLETE 4+ VIEW COMPARISON:  Chest radiograph 09/08/2019 FINDINGS: Chest: Heart size at upper limits of normal. No pulmonary vascular congestion. New airspace opacity seen in the left inferolateral lung. Lungs otherwise clear. Pelvis: No fracture or dislocation. Lumbar spine: Extensive atherosclerotic changes seen throughout visualized arterial segments. Grade 1 anterolisthesis of L5 on S1. Vertebral body heights otherwise maintained. Mild disc height loss at L3-L4, L4-L5, L5-S1. Endplate spurring seen throughout the lumbar spine, greatest at L1-L2. Mild-to-moderate facet degenerative changes also present throughout the lumbar spine, greatest at L4-L5. Calcifications seen in the upper abdomen bilaterally, possibly located within the kidneys or renal pelvis, measures up to 7 mm on the right and 4 mm on the left. Right knee: Diffuse osteopenia. No fracture or dislocation. Mild thickening of the patellar tendon. IMPRESSION: 1. No acute fracture or dislocation. 2. Atypical focal opacity of the left inferolateral lung base requires further evaluation with contrast enhanced chest CT. This may be due to infection, atelectasis, or mass. 3. No acute abnormality of the lumbar spine are pelvis. 4. Diffuse atherosclerotic calcifications of the abdominal aorta with focal dilatation measuring 2.8 cm. Further evaluation with nonemergent abdominal aorta ultrasound is recommended. 5. Mild thickening of the patellar tendon may be due to chronic tendinosis or recent injury. Electronically Signed   By: Acquanetta Belling M.D.   On: 09/23/2023 13:27   DG Knee Complete 4 Views Right  Result Date: 09/23/2023 CLINICAL DATA:  Back and leg pain status post fall last night. Confusion. EXAM: RIGHT KNEE - COMPLETE 4+ VIEW; CHEST 1 VIEW; PELVIS - 1-2 VIEW; LUMBAR SPINE - COMPLETE 4+ VIEW COMPARISON:  Chest radiograph 09/08/2019 FINDINGS: Chest: Heart size at upper limits of normal. No pulmonary vascular  congestion. New airspace opacity seen in the left inferolateral lung. Lungs otherwise clear. Pelvis: No fracture or dislocation. Lumbar spine: Extensive atherosclerotic changes seen throughout visualized arterial segments. Grade 1 anterolisthesis of L5 on S1. Vertebral body heights otherwise maintained. Mild disc height loss at L3-L4, L4-L5, L5-S1. Endplate spurring seen throughout the lumbar spine, greatest at L1-L2. Mild-to-moderate facet degenerative changes also present throughout the lumbar spine, greatest at L4-L5. Calcifications seen in the upper abdomen bilaterally, possibly located within the kidneys or renal pelvis, measures up to 7 mm on the right and 4 mm on the left. Right knee: Diffuse osteopenia. No fracture or dislocation. Mild thickening of the patellar tendon. IMPRESSION: 1. No acute fracture or dislocation. 2. Atypical focal  opacity of the left inferolateral lung base requires further evaluation with contrast enhanced chest CT. This may be due to infection, atelectasis, or mass. 3. No acute abnormality of the lumbar spine are pelvis. 4. Diffuse atherosclerotic calcifications of the abdominal aorta with focal dilatation measuring 2.8 cm. Further evaluation with nonemergent abdominal aorta ultrasound is recommended. 5. Mild thickening of the patellar tendon may be due to chronic tendinosis or recent injury. Electronically Signed   By: Acquanetta Belling M.D.   On: 09/23/2023 13:27   DG Pelvis 1-2 Views  Result Date: 09/23/2023 CLINICAL DATA:  Back and leg pain status post fall last night. Confusion. EXAM: RIGHT KNEE - COMPLETE 4+ VIEW; CHEST 1 VIEW; PELVIS - 1-2 VIEW; LUMBAR SPINE - COMPLETE 4+ VIEW COMPARISON:  Chest radiograph 09/08/2019 FINDINGS: Chest: Heart size at upper limits of normal. No pulmonary vascular congestion. New airspace opacity seen in the left inferolateral lung. Lungs otherwise clear. Pelvis: No fracture or dislocation. Lumbar spine: Extensive atherosclerotic changes seen  throughout visualized arterial segments. Grade 1 anterolisthesis of L5 on S1. Vertebral body heights otherwise maintained. Mild disc height loss at L3-L4, L4-L5, L5-S1. Endplate spurring seen throughout the lumbar spine, greatest at L1-L2. Mild-to-moderate facet degenerative changes also present throughout the lumbar spine, greatest at L4-L5. Calcifications seen in the upper abdomen bilaterally, possibly located within the kidneys or renal pelvis, measures up to 7 mm on the right and 4 mm on the left. Right knee: Diffuse osteopenia. No fracture or dislocation. Mild thickening of the patellar tendon. IMPRESSION: 1. No acute fracture or dislocation. 2. Atypical focal opacity of the left inferolateral lung base requires further evaluation with contrast enhanced chest CT. This may be due to infection, atelectasis, or mass. 3. No acute abnormality of the lumbar spine are pelvis. 4. Diffuse atherosclerotic calcifications of the abdominal aorta with focal dilatation measuring 2.8 cm. Further evaluation with nonemergent abdominal aorta ultrasound is recommended. 5. Mild thickening of the patellar tendon may be due to chronic tendinosis or recent injury. Electronically Signed   By: Acquanetta Belling M.D.   On: 09/23/2023 13:27   CT Head Wo Contrast  Result Date: 09/23/2023 CLINICAL DATA:  Head trauma, minor (Age >= 65y); Neck trauma (Age >= 65y). Fall with bruising to right forehead, posterior head and neck pain. EXAM: CT HEAD WITHOUT CONTRAST CT CERVICAL SPINE WITHOUT CONTRAST TECHNIQUE: Multidetector CT imaging of the head and cervical spine was performed following the standard protocol without intravenous contrast. Multiplanar CT image reconstructions of the cervical spine were also generated. RADIATION DOSE REDUCTION: This exam was performed according to the departmental dose-optimization program which includes automated exposure control, adjustment of the mA and/or kV according to patient size and/or use of iterative  reconstruction technique. COMPARISON:  CT head and cervical spine 05/12/2023. FINDINGS: CT HEAD FINDINGS Brain: No acute hemorrhage. Unchanged moderate chronic small-vessel disease. Cortical gray-white differentiation is otherwise preserved. Prominence of the ventricles and sulci within expected range for age. No hydrocephalus or extra-axial collection. No mass effect or midline shift. Vascular: No hyperdense vessel or unexpected calcification. Skull: No calvarial fracture or suspicious bone lesion. Skull base is unremarkable. Sinuses/Orbits: No acute finding. Other: Right frontal scalp hematoma. CT CERVICAL SPINE FINDINGS Alignment: Normal. Skull base and vertebrae: No acute fracture. Normal craniocervical junction. Progressive endplate erosions at C7-T1, suspicious for discitis-osteomyelitis versus severe degenerative change. Soft tissues and spinal canal: No prevertebral fluid or swelling. No visible canal hematoma. Disc levels: Multilevel cervical spondylosis, worst at C5-6, where there is at  least mild spinal canal stenosis. Upper chest: No acute findings. Other: None. IMPRESSION: 1. No acute intracranial abnormality. Right frontal scalp hematoma. 2. No acute cervical spine fracture or traumatic listhesis. 3. Progressive endplate erosions at C7-T1, suspicious for discitis-osteomyelitis versus severe degenerative change. Consider MRI of the cervical spine with and without contrast for further evaluation. Electronically Signed   By: Orvan Falconer M.D.   On: 09/23/2023 13:03   CT Cervical Spine Wo Contrast  Result Date: 09/23/2023 CLINICAL DATA:  Head trauma, minor (Age >= 65y); Neck trauma (Age >= 65y). Fall with bruising to right forehead, posterior head and neck pain. EXAM: CT HEAD WITHOUT CONTRAST CT CERVICAL SPINE WITHOUT CONTRAST TECHNIQUE: Multidetector CT imaging of the head and cervical spine was performed following the standard protocol without intravenous contrast. Multiplanar CT image  reconstructions of the cervical spine were also generated. RADIATION DOSE REDUCTION: This exam was performed according to the departmental dose-optimization program which includes automated exposure control, adjustment of the mA and/or kV according to patient size and/or use of iterative reconstruction technique. COMPARISON:  CT head and cervical spine 05/12/2023. FINDINGS: CT HEAD FINDINGS Brain: No acute hemorrhage. Unchanged moderate chronic small-vessel disease. Cortical gray-white differentiation is otherwise preserved. Prominence of the ventricles and sulci within expected range for age. No hydrocephalus or extra-axial collection. No mass effect or midline shift. Vascular: No hyperdense vessel or unexpected calcification. Skull: No calvarial fracture or suspicious bone lesion. Skull base is unremarkable. Sinuses/Orbits: No acute finding. Other: Right frontal scalp hematoma. CT CERVICAL SPINE FINDINGS Alignment: Normal. Skull base and vertebrae: No acute fracture. Normal craniocervical junction. Progressive endplate erosions at C7-T1, suspicious for discitis-osteomyelitis versus severe degenerative change. Soft tissues and spinal canal: No prevertebral fluid or swelling. No visible canal hematoma. Disc levels: Multilevel cervical spondylosis, worst at C5-6, where there is at least mild spinal canal stenosis. Upper chest: No acute findings. Other: None. IMPRESSION: 1. No acute intracranial abnormality. Right frontal scalp hematoma. 2. No acute cervical spine fracture or traumatic listhesis. 3. Progressive endplate erosions at C7-T1, suspicious for discitis-osteomyelitis versus severe degenerative change. Consider MRI of the cervical spine with and without contrast for further evaluation. Electronically Signed   By: Orvan Falconer M.D.   On: 09/23/2023 13:03       Kathlen Mody M.D. [facility] 09/24/2023, 12:42 PM  Available via Epic secure chat 7am-7pm After 7 pm, please refer to night coverage  provider listed on amion.

## 2023-09-24 NOTE — Progress Notes (Signed)
Pt still refused lab blood draws.

## 2023-09-24 NOTE — Progress Notes (Signed)
Pt refused labs at this time. The provider Chinita Greenland, NP was notified and agreed to draw labs around 8 am

## 2023-09-24 NOTE — Plan of Care (Signed)
  Problem: Education: Goal: Knowledge of General Education information will improve Description: Including pain rating scale, medication(s)/side effects and non-pharmacologic comfort measures Outcome: Not Progressing   Problem: Clinical Measurements: Goal: Ability to maintain clinical measurements within normal limits will improve Outcome: Progressing Goal: Will remain free from infection Outcome: Progressing Goal: Respiratory complications will improve Outcome: Progressing Goal: Cardiovascular complication will be avoided Outcome: Progressing   Problem: Health Behavior/Discharge Planning: Goal: Ability to manage health-related needs will improve Outcome: Not Progressing   Problem: Activity: Goal: Risk for activity intolerance will decrease Outcome: Not Progressing   Problem: Nutrition: Goal: Adequate nutrition will be maintained Outcome: Progressing   Problem: Coping: Goal: Level of anxiety will decrease Outcome: Not Progressing   Problem: Elimination: Goal: Will not experience complications related to bowel motility Outcome: Progressing Goal: Will not experience complications related to urinary retention Outcome: Progressing   Problem: Pain Managment: Goal: General experience of comfort will improve Outcome: Progressing   Problem: Safety: Goal: Ability to remain free from injury will improve Outcome: Progressing   Problem: Skin Integrity: Goal: Risk for impaired skin integrity will decrease Outcome: Progressing

## 2023-09-24 NOTE — Consult Note (Signed)
[facility]    Date of Admission:  09/23/2023     Total days of antibiotics 0               Reason for Consult: Osteomyelitis  Referring Provider: Dr. Blake Divine  Primary Care Provider: Bennie Pierini, FNP   ASSESSMENT:  Katelyn Ballard is a 78 y/o caucasian female admitted from [facility] memory care unit following a fall with altered mental status and found to have the scalp hematoma and evidence of C7-T1 osteomyelitis. No interventions/aspiration per IR with secondary to risk outweighing benefits of procedure. Suspect that this is likely degeneration as opposed to true infection although infection cannot be ruled out. Will check inflammatory markers although may be elevated from recent trauma. Would favor watchful waiting without antibiotics at this point given history and physical exam. Remaining medical and supportive care per Internal Medicine.   PLAN:  Continue to hold antibiotics at this time.  Remaining medical and supportive care per Internal Medicine.    Principal Problem:   Osteomyelitis (HCC) Active Problems:   Hyperlipidemia with target LDL less than 100   TIA (transient ischemic attack)   Type 2 diabetes mellitus without complication, without long-term current use of insulin (HCC)   Hypokalemia   Sundowning    clonazePAM  0.5 mg Oral QHS   divalproex  125 mg Oral Q12H   escitalopram  5 mg Oral QHS   insulin aspart  0-15 Units Subcutaneous TID WC   insulin aspart  0-5 Units Subcutaneous QHS   metoprolol tartrate  12.5 mg Oral BID    morphine injection  2 mg Intravenous STAT   rosuvastatin  10 mg Oral Daily     HPI: Katelyn Ballard is a 78 y.o. female with previous medical history of dementia, Type 2 diabetes, hypertension and CVA presenting from [facility] following a fall and altered mental status.   Katelyn Ballard experienced a fall at her facility on 09/22/23 and arrived c/o back and right knee pain. Afebrile on  admission with no leukocytosis. CT head and cervical spine with no acute intracranial abnormality and right frontal scalp hematoma and progressive endplate erosions at C7-T1 suspicious for discitis-osteomyelitis versus severe degenerative change. MRI cervical with findings consistent with C7-T1 discitis/osteomyelitis with no epidural abscess. X-ray of the knee and lumbar spine were unremarkable. Chest x-ray with atypical focal opacity of the left inferolateral lung base with CT chest showing prominent epicardial fat pad that corresponded with opacity. ID consulted with recommendations to hold antibiotics and possible IR aspiration. Currently refusing lab work.   Ms. Mince daughter is present during visit. Overall has been stable with no recent sickness or illness. Does not recall her complaining of any neck or back pain.   Review of Systems: Review of Systems  Constitutional:  Negative for chills, fever and weight loss.  Respiratory:  Negative for cough, shortness of breath and wheezing.   Cardiovascular:  Negative for chest pain and leg swelling.  Gastrointestinal:  Negative for abdominal pain, constipation, diarrhea, nausea and vomiting.  Skin:  Negative for rash.     Past Medical History:  Diagnosis Date   Ankle fracture, right    1990's   Diabetes mellitus without complication (HCC)    Gout 2006   Hyperlipidemia    Hypertension    Osteopenia     Social History   Tobacco Use   Smoking status: Former    Current packs/day: 0.00    Types:  Cigarettes    Quit date: 12/31/1992    Years since quitting: 30.7   Smokeless tobacco: Never  Vaping Use   Vaping status: Never Used  Substance Use Topics   Alcohol use: No   Drug use: No    Family History  Problem Relation Age of Onset   Diabetes Mother    Heart disease Mother    Kidney disease Mother        blockage of renal artery   Dementia Mother    Heart disease Father    Heart disease Sister    Diabetes Sister    Diabetes  Sister     Allergies  Allergen Reactions   Atorvastatin Other (See Comments)    myalgias   Shellfish Allergy     Other reaction(s): Unknown   Penicillins     OBJECTIVE: Blood pressure (!) 143/65, pulse (!) 52, temperature 98.6 F (37 C), temperature source Oral, resp. rate 18, height 5\' 3"  (1.6 m), SpO2 93%.  Physical Exam Constitutional:      General: She is not in acute distress.    Appearance: She is well-developed.     Comments: Lying in bed with head of bed elevated; facial ecchymosis  Cardiovascular:     Rate and Rhythm: Normal rate and regular rhythm.     Heart sounds: Normal heart sounds.  Pulmonary:     Effort: Pulmonary effort is normal.     Breath sounds: Normal breath sounds.  Musculoskeletal:     Cervical back: No rigidity or tenderness.  Skin:    General: Skin is warm and dry.  Neurological:     Mental Status: She is alert. She is disoriented.     Lab Results Lab Results  Component Value Date   WBC 8.2 09/23/2023   HGB 11.1 (L) 09/23/2023   HCT 37.5 09/23/2023   MCV 89.1 09/23/2023   PLT 184 09/23/2023    Lab Results  Component Value Date   CREATININE 1.08 (H) 09/23/2023   BUN 25 (H) 09/23/2023   NA 142 09/23/2023   K 3.3 (L) 09/23/2023   CL 103 09/23/2023   CO2 27 09/23/2023    Lab Results  Component Value Date   ALT 14 08/24/2021   AST 9 08/24/2021   ALKPHOS 94 08/24/2021   BILITOT <0.2 08/24/2021     Microbiology: No results found for this or any previous visit (from the past 240 hour(s)).   Marcos Eke, NP [facility]  09/24/2023  9:45 AM

## 2023-09-24 NOTE — Progress Notes (Signed)
Brief NIR Note NIR service contacted to evaluate patient for possible disc aspiration or bone biopsy at C7-T1 due to concern for cervical osteomyelitis. Case was reviewed by Dr Tommi Rumps Melchor Amour and Dr Corliss Skains who both are in agreement that there is minimal fluid in the disc space for aspiration and that a bone biopsy alone has low probability of yielding a successful culture. Given the likelihood of a low yield for culture, risks of the procedure outweigh potential benefits. NIR team recommends empirical treatment with antibiotics and repeating imaging in approximately 4 weeks. Kennieth Francois, PA-C 09/24/2023 10:02 AM

## 2023-09-25 DIAGNOSIS — M869 Osteomyelitis, unspecified: Secondary | ICD-10-CM | POA: Diagnosis not present

## 2023-09-25 DIAGNOSIS — M539 Dorsopathy, unspecified: Secondary | ICD-10-CM

## 2023-09-25 LAB — GLUCOSE, CAPILLARY
Glucose-Capillary: 95 mg/dL (ref 70–99)
Glucose-Capillary: 201 mg/dL — ABNORMAL HIGH (ref 70–99)
Glucose-Capillary: 101 mg/dL — ABNORMAL HIGH (ref 70–99)
Glucose-Capillary: 144 mg/dL — ABNORMAL HIGH (ref 70–99)

## 2023-09-25 LAB — C-REACTIVE PROTEIN: CRP: 3.9 mg/dL — ABNORMAL HIGH (ref <1.0)

## 2023-09-25 LAB — SEDIMENTATION RATE: Sed Rate: 38 mm/hr — ABNORMAL HIGH (ref 0–22)

## 2023-09-25 NOTE — Plan of Care (Signed)
  Problem: Education: Goal: Knowledge of General Education information will improve Description: Including pain rating scale, medication(s)/side effects and non-pharmacologic comfort measures Outcome: Not Progressing   Problem: Coping: Goal: Level of anxiety will decrease Outcome: Not Progressing   Problem: Safety: Goal: Ability to remain free from injury will improve Outcome: Not Progressing   Problem: Skin Integrity: Goal: Risk for impaired skin integrity will decrease Outcome: Not Progressing   Problem: Elimination: Goal: Will not experience complications related to bowel motility Outcome: Not Progressing Goal: Will not experience complications related to urinary retention Outcome: Not Progressing

## 2023-09-25 NOTE — Progress Notes (Signed)
[facility]  Date of Admission:  09/23/2023     Total days of antibiotics 0         ASSESSMENT:  Katelyn Ballard continues to remain without fever and have low suspicion for infection with normal inflammatory markers. Will continue to monitor off antibiotics and follow up outpatient. Ok for discharge from ID standpoint. Remaining medical and supportive care per Internal Medicine.   PLAN:  Continue to monitor off antibiotics. Follow up with ID outpatient. Ok for discharge from ID standpoint. Remaining medical and supportive care per Internal Medicine.  ID will sign off. Please re-consult if needed.   Principal Problem:   Osteomyelitis (HCC) Active Problems:   Hyperlipidemia with target LDL less than 100   TIA (transient ischemic attack)   Type 2 diabetes mellitus without complication, without long-term current use of insulin (HCC)   Hypokalemia   Sundowning    clonazePAM  0.5 mg Oral QHS   divalproex  375 mg Oral TID   escitalopram  5 mg Oral QHS   insulin aspart  0-15 Units Subcutaneous TID WC   insulin aspart  0-5 Units Subcutaneous QHS   metoprolol tartrate  12.5 mg Oral BID   rosuvastatin  10 mg Oral Daily    SUBJECTIVE:  Afebrile overnight with no acute events. Not feeling good today, but no pain and denies fevers/chills.   Allergies  Allergen Reactions   Atorvastatin Other (See Comments)    myalgias   Shellfish Allergy     Other reaction(s): Unknown   Penicillins      Review of Systems: Review of Systems  Constitutional:  Positive for malaise/fatigue. Negative for chills, fever and weight loss.  Respiratory:  Negative for cough, shortness of breath and wheezing.   Cardiovascular:  Negative for chest pain and leg swelling.  Gastrointestinal:  Negative for abdominal pain, constipation, diarrhea, nausea and vomiting.  Skin:  Negative for rash.      OBJECTIVE: Vitals:   09/24/23 0820 09/24/23 1302 09/24/23 2304 09/25/23  0835  BP: (!) 143/65 (!) 143/76 115/70 94/61  Pulse: (!) 52 (!) 57 (!) 59   Resp: 18 18 18 17   Temp: 98.6 F (37 C) 98.8 F (37.1 C)  98.7 F (37.1 C)  TempSrc: Oral Oral    SpO2: 93% 94% 92% 95%  Height:       Body mass index is 27.46 kg/m.  Physical Exam Constitutional:      General: Katelyn Ballard is not in acute distress.    Appearance: Katelyn Ballard is well-developed.  Cardiovascular:     Rate and Rhythm: Normal rate and regular rhythm.     Heart sounds: Normal heart sounds.  Pulmonary:     Effort: Pulmonary effort is normal.     Breath sounds: Normal breath sounds.  Skin:    General: Skin is warm and dry.  Neurological:     Mental Status: Katelyn Ballard is alert. Katelyn Ballard is disoriented.  Psychiatric:        Mood and Affect: Mood normal.     Lab Results Lab Results  Component Value Date   WBC 7.0 09/24/2023   HGB 10.9 (L) 09/24/2023   HCT 36.2 09/24/2023   MCV 88.9 09/24/2023   PLT 194 09/24/2023    Lab Results  Component Value Date   CREATININE 1.04 (H) 09/24/2023   BUN 21 09/24/2023   NA 143 09/24/2023   K 3.7 09/24/2023   CL 106 09/24/2023   CO2 29 09/24/2023  Lab Results  Component Value Date   ALT 10 09/24/2023   AST 8 (L) 09/24/2023   ALKPHOS 100 09/24/2023   BILITOT 0.2 (L) 09/24/2023     Microbiology: Recent Results (from the past 240 hour(s))  Urine Culture     Status: Abnormal (Preliminary result)   Collection Time: 09/23/23  1:22 PM   Specimen: Urine, Clean Catch  Result Value Ref Range Status   Specimen Description   Final    URINE, CLEAN CATCH Performed at [facility], [address] [place], [place] [address]    Special Requests   Final    NONE Performed at [facility], [address], [place], [place] [address]    Culture (A)  Final    30,000 COLONIES/mL KLEBSIELLA PNEUMONIAE SUSCEPTIBILITIES TO FOLLOW Performed at [facility], [address], [place], [place] [address]    Report Status PENDING  Incomplete     Marcos Eke, NP [facility]  09/25/2023  1:56 PM

## 2023-09-25 NOTE — TOC Initial Note (Signed)
Transition of Care [facility]) - Initial/Assessment Note    Patient Details  Name: Katelyn Ballard MRN: [id number] Date of Birth: [birth year]  Transition of Care [facility]) CM/SW Contact:    Lorri Frederick, LCSW Phone Number: 09/25/2023, 2:19 PM  Clinical Narrative:     Pt oriented x1.  CSW spoke with son Mellody Dance in room.  [phone].  He confirmed pt LTC at [facility], he does want pt to return there at DC.  Discussed transportation.  Mellody Dance reports he is unable to transport due to cataract, does want ambulance transport.    CSW message with [facility].  She can receive pt tomorrow.  Discussed no PT eval, pt will return LTC, not rehab.               Expected Discharge Plan: Long Term Nursing Home Barriers to Discharge: No Barriers Identified   Patient Goals and CMS Choice            Expected Discharge Plan and Services In-house Referral: Clinical Social Work   Post Acute Care Choice: Nursing Home Living arrangements for the past 2 months: Skilled Nursing Facility [facility], long term care)                                      Prior Living Arrangements/Services Living arrangements for the past 2 months: Skilled Nursing Facility [facility], long term care) Lives with:: Facility Resident Patient language and need for interpreter reviewed:: No        Need for Family Participation in Patient Care: Yes (Comment) Care giver support system in place?: Yes (comment) Current home services: Other (comment) (na) Criminal Activity/Legal Involvement Pertinent to Current Situation/Hospitalization: No - Comment as needed  Activities of Daily Living Home Assistive Devices/Equipment: None ADL Screening (condition at time of admission) Is the patient deaf or have difficulty hearing?: No Does the patient have difficulty seeing, even when wearing glasses/contacts?: No Does the patient have difficulty concentrating, remembering, or making decisions?:  Yes  Permission Sought/Granted                  Emotional Assessment Appearance:: Appears stated age Attitude/Demeanor/Rapport: Unable to Assess Affect (typically observed): Unable to Assess Orientation: : Oriented to Self      Admission diagnosis:  Osteomyelitis (HCC) [M86.9] Contusion of right knee, initial encounter [S80.01XA] Injury of head, initial encounter [S09.90XA] Fall, initial encounter [W19.XXXA] Patient Active Problem List   Diagnosis Date Noted   Osteomyelitis (HCC) 09/23/2023   Hypokalemia 09/23/2023   Sundowning 09/23/2023   Acute cerebrovascular accident (CVA) due to occlusion of left posterior cerebral artery (HCC) 02/05/2022   Broca's aphasia 02/05/2022   TIA (transient ischemic attack) 02/05/2022   Acute diastolic heart failure (HCC) 02/04/2022   Acute encephalopathy 02/04/2022   History of COVID-19 02/04/2022   History of non-ST elevation myocardial infarction (NSTEMI) 02/04/2022   Occipital stroke (HCC) 02/04/2022   Anemia 01/03/2022   History of pulmonary embolism 01/03/2022   Former smoker 02/04/2021   Type 2 diabetes mellitus without complication, without long-term current use of insulin (HCC) 02/04/2021   Trigger finger, left middle finger 02/26/2019   BMI 33.0-33.9,adult 10/04/2015   Vitamin D deficiency 04/08/2015   Diabetes (HCC) 05/03/2014   Hypertension 10/22/2013   Hyperlipidemia with target LDL less than 100 10/22/2013   Gout 10/22/2013   PCP:  Bennie Pierini, FNP Pharmacy:   [facility] [phone] -  [place], [place] - [address] [place] [address] Phone: [phone] Fax: [phone]     Social Determinants of Health (SDOH) Social History: SDOH Screenings   Food Insecurity: Unknown (09/24/2023)  Utilities: Patient Unable To Answer (09/24/2023)  Depression (PHQ2-9): Medium Risk (02/05/2022)  Financial Resource Strain: Low Risk  (02/05/2022)   [facility] from [facility]  Physical  Activity: Insufficiently Active (02/05/2022)  Social Connections: Unknown (05/06/2022)   Received from [facility], [facility]  Stress: No Stress Concern Present (02/05/2022)   Received from [facility]  Recent Concern: Stress - Stress Concern Present (02/05/2022)  Tobacco Use: Medium Risk (09/23/2023)   SDOH Interventions:     Readmission Risk Interventions     No data to display

## 2023-09-25 NOTE — TOC CAGE-AID Note (Signed)
Transition of Care [facility]) - CAGE-AID Screening   Patient Details  Name: Katelyn Ballard MRN: [id number] Date of Birth: 11-Sep-1945  Transition of Care [facility]) CM/SW Contact:    Leota Sauers, RN Phone Number: 09/25/2023, 6:44 AM   Clinical Narrative:  Patient denies use of alcohol and illicit drugs. Resources not given at this time.   CAGE-AID Screening:    Have You Ever Felt You Ought to Cut Down on Your Drinking or Drug Use?: No Have People Annoyed You By Critizing Your Drinking Or Drug Use?: No Have You Felt Bad Or Guilty About Your Drinking Or Drug Use?: No Have You Ever Had a Drink or Used Drugs First Thing In The Morning to Steady Your Nerves or to Get Rid of a Hangover?: No CAGE-AID Score: 0  Substance Abuse Education Offered: No

## 2023-09-25 NOTE — NC FL2 (Signed)
[place] MEDICAID FL2 LEVEL OF CARE FORM     IDENTIFICATION  Patient Name: Katelyn Ballard Birthdate: 03/15/1945 Sex: female Admission Date (Current Location): 09/23/2023  [facility] and [place] Number:  Producer, television/film/video and Address:  [facility] [place], [address] [place], [place] [address]      Provider Number: [id number]  Attending Physician Name and Address:  Leatha Gilding, MD  Relative Name and Phone Number:  Takaya, Buza [phone]    Current Level of Care: Hospital Recommended Level of Care: Skilled Nursing Facility Prior Approval Number:    Date Approved/Denied:   PASRR Number: [id number]  Discharge Plan: SNF    Current Diagnoses: Patient Active Problem List   Diagnosis Date Noted   Osteomyelitis (HCC) 09/23/2023   Hypokalemia 09/23/2023   Sundowning 09/23/2023   Acute cerebrovascular accident (CVA) due to occlusion of left posterior cerebral artery (HCC) 02/05/2022   Broca's aphasia 02/05/2022   TIA (transient ischemic attack) 02/05/2022   Acute diastolic heart failure (HCC) 02/04/2022   Acute encephalopathy 02/04/2022   History of COVID-19 02/04/2022   History of non-ST elevation myocardial infarction (NSTEMI) 02/04/2022   Occipital stroke (HCC) 02/04/2022   Anemia 01/03/2022   History of pulmonary embolism 01/03/2022   Former smoker 02/04/2021   Type 2 diabetes mellitus without complication, without long-term current use of insulin (HCC) 02/04/2021   Trigger finger, left middle finger 02/26/2019   BMI 33.0-33.9,adult 10/04/2015   Vitamin D deficiency 04/08/2015   Diabetes (HCC) 05/03/2014   Hypertension 10/22/2013   Hyperlipidemia with target LDL less than 100 10/22/2013   Gout 10/22/2013    Orientation RESPIRATION BLADDER Height & Weight     Self  Normal External catheter, Incontinent Weight:   Height:  5\' 3"  (160 cm)  BEHAVIORAL SYMPTOMS/MOOD NEUROLOGICAL BOWEL NUTRITION STATUS      Continent Diet (see  discharge summary)  AMBULATORY STATUS COMMUNICATION OF NEEDS Skin     Verbally Skin abrasions, Other (Comment) (hematoma)                       Personal Care Assistance Level of Assistance              Functional Limitations Info  Sight, Hearing, Speech Sight Info: Adequate Hearing Info: Adequate Speech Info: Adequate    SPECIAL CARE FACTORS FREQUENCY                       Contractures Contractures Info: Not present    Additional Factors Info  Code Status, Allergies Code Status Info: DNR Allergies Info: Atorvastatin, Shellfish Allergy, Penicillins           Current Medications (09/25/2023):  This is the current hospital active medication list Current Facility-Administered Medications  Medication Dose Route Frequency Provider Last Rate Last Admin   acetaminophen (TYLENOL) tablet 650 mg  650 mg Oral Q6H PRN Zierle-Ghosh, Asia B, DO       Or   acetaminophen (TYLENOL) suppository 650 mg  650 mg Rectal Q6H PRN Zierle-Ghosh, Asia B, DO       clonazePAM (KLONOPIN) tablet 0.5 mg  0.5 mg Oral QHS Zierle-Ghosh, Asia B, DO   0.5 mg at 09/24/23 2306   divalproex (DEPAKOTE SPRINKLE) capsule 375 mg  375 mg Oral TID Kathlen Mody, MD   375 mg at 09/25/23 1057   escitalopram (LEXAPRO) tablet 5 mg  5 mg Oral QHS Zierle-Ghosh, Asia B, DO   5 mg at  09/24/23 2305   insulin aspart (novoLOG) injection 0-15 Units  0-15 Units Subcutaneous TID WC Zierle-Ghosh, Asia B, DO   5 Units at 09/25/23 1310   insulin aspart (novoLOG) injection 0-5 Units  0-5 Units Subcutaneous QHS Zierle-Ghosh, Asia B, DO       metoprolol tartrate (LOPRESSOR) tablet 12.5 mg  12.5 mg Oral BID Zierle-Ghosh, Asia B, DO   12.5 mg at 09/25/23 1057   ondansetron (ZOFRAN) tablet 4 mg  4 mg Oral Q6H PRN Zierle-Ghosh, Asia B, DO       Or   ondansetron (ZOFRAN) injection 4 mg  4 mg Intravenous Q6H PRN Zierle-Ghosh, Asia B, DO       oxyCODONE (Oxy IR/ROXICODONE) immediate release tablet 5 mg  5 mg Oral Q4H PRN  Zierle-Ghosh, Asia B, DO   5 mg at 09/23/23 2246   rosuvastatin (CRESTOR) tablet 10 mg  10 mg Oral Daily Zierle-Ghosh, Asia B, DO   10 mg at 09/25/23 1057     Discharge Medications: Please see discharge summary for a list of discharge medications.  Relevant Imaging Results:  Relevant Lab Results:   Additional Information SSN: [id number]  Lorri Frederick, LCSW

## 2023-09-25 NOTE — Progress Notes (Signed)
PROGRESS NOTE  Katelyn Ballard:[id number] DOB: Jun 17, 1945 DOA: 09/23/2023 PCP: Bennie Pierini, FNP   LOS: 2 days   Brief Narrative / Interim history: 78 y.o. female with medical history significant of diabetes mellitus type 2, hyperlipidemia, hypertension, CVA, and more presents the ER with a chief complaint of fall. Patient is wheelchair-bound and does not walk. It is unclear what her mechanism of her fall was. She does have bruising on her forehead that makes it look like she took a nose dive out of bed. In the ER patient had no acute fracture in the C-spine but she did have progressive erosions at C7 and T1. MRI with concerns for osteomyelitis/discitis. Infectious disease was consulted and recommended no antibiotics at this time, transfer to [facility], IR biopsy of affected area. CT head showed no acute intracranial abnormality. It did show a frontal scalp hematoma. Chest x-ray showed an atypical focal opacity in the left lung base.   Subjective / 24h Interval events: No complaints. Underlying dementia  Assesement and Plan: Principal Problem:   Osteomyelitis (HCC) Active Problems:   Hyperlipidemia with target LDL less than 100   TIA (transient ischemic attack)   Type 2 diabetes mellitus without complication, without long-term current use of insulin (HCC)   Hypokalemia   Sundowning   Principal problem Concern for osteomyelitis - ID consulted, it is felt like the MRI findings represent chronic issues, and recommending no antibiotics at this time. She is afebrile and has no leukocytosis. MRI shows findings consistent with discitis osteomyelitis at C7-T1.  No evidence of epidural abscess.  IR consulted, and suggested Given the likelihood of a low yield for culture, risks of the procedure outweigh potential benefits. Continue to monitor fever curve. Will need repeat imaging of the C spine in 4 weeks.  Active problems  Dementia, sundowning - Patient from SNF memory care unit.  Continue Depakote and Klonopin   Hypokalemia - Replaced. Continue to monitor   Type 2 diabetes mellitus - without complication, without long-term current use of insulin (HCC)  Lab Results  Component Value Date   HGBA1C 6.4 (H) 09/24/2023   CBG (last 3)  Recent Labs    09/24/23 1550 09/24/23 2318 09/25/23 0834  GLUCAP 146* 117* 95    TIA (transient ischemic attack) - Restart aspirin, plavix and statin on discharge.    Hyperlipidemia with target LDL less than 100 - Continue statin   Dementia - No agitation today.   Scheduled Meds:  clonazePAM  0.5 mg Oral QHS   divalproex  375 mg Oral TID   escitalopram  5 mg Oral QHS   insulin aspart  0-15 Units Subcutaneous TID WC   insulin aspart  0-5 Units Subcutaneous QHS   metoprolol tartrate  12.5 mg Oral BID   rosuvastatin  10 mg Oral Daily   Continuous Infusions: PRN Meds:.acetaminophen **OR** acetaminophen, ondansetron **OR** ondansetron (ZOFRAN) IV, oxyCODONE  Current Outpatient Medications  Medication Instructions   allopurinol (ZYLOPRIM) 300 mg, Oral, Daily   aspirin 81 mg, Oral, Every morning   blood glucose meter kit and supplies Check blood sugar bid and as needed Dx: E11.42   blood glucose meter kit and supplies Dispense based on patient and insurance preference. Use up to four times daily as directed. (FOR ICD-10 E10.9, E11.9).   cetirizine (ZYRTEC) 10 mg, Oral, Daily   Cholecalciferol 2,000 Units, Oral, Daily   clonazePAM (KLONOPIN) 0.5 mg, Oral, 3 times daily   clopidogrel (PLAVIX) 75 mg, Oral, Every morning   divalproex (  DEPAKOTE SPRINKLE) 375 mg, Oral, 3 times daily   escitalopram (LEXAPRO) 10 mg, Oral, Daily   furosemide (LASIX) 20 mg, Oral, Daily   glucose blood (ONETOUCH ULTRA) test strip USE 1 STRIP TO CHECK GLUCOSE 2 TO 3 TIMES DAILY   loperamide (IMODIUM) 4 mg, Oral, As needed, Do not exceed 4 caps in 24 hours.   methocarbamol (ROBAXIN) 500 mg, Oral, Every evening   metoprolol tartrate (LOPRESSOR) 12.5  mg, Oral, 2 times daily   mirtazapine (REMERON) 15 mg, Oral, 2 times daily   oxybutynin (DITROPAN) 5 mg, Oral, Daily at bedtime   rosuvastatin (CRESTOR) 40 mg, Oral, Every evening   sennosides-docusate sodium (SENOKOT-S) 8.6-50 MG tablet 2 tablets, Oral, Every morning   Trulicity 0.75 mg, Subcutaneous, Weekly, Saturdays.    Diet Orders (From admission, onward)     Start     Ordered   09/23/23 2111  Diet Heart Room service appropriate? Yes; Fluid consistency: Thin  Diet effective now       Question Answer Comment  Room service appropriate? Yes   Fluid consistency: Thin      09/23/23 2110            DVT prophylaxis: SCDs Start: 09/23/23 2111   Lab Results  Component Value Date   PLT 194 09/24/2023      Code Status: Limited: Do not attempt resuscitation (DNR) -DNR-LIMITED -Do Not Intubate/DNI   Family Communication: tried calling son  Status is: Inpatient Remains inpatient appropriate because: SNF tomorrow if afebrile  Level of care: Med-Surg  Consultants:  ID  Objective: Vitals:   09/24/23 0820 09/24/23 1302 09/24/23 2304 09/25/23 0835  BP: (!) 143/65 (!) 143/76 115/70 94/61  Pulse: (!) 52 (!) 57 (!) 59   Resp: 18 18 18 17   Temp: 98.6 F (37 C) 98.8 F (37.1 C)  98.7 F (37.1 C)  TempSrc: Oral Oral    SpO2: 93% 94% 92% 95%  Height:       No intake or output data in the 24 hours ending 09/25/23 1103 Wt Readings from Last 3 Encounters:  05/12/23 70.3 kg  08/24/21 87.5 kg  04/18/21 85.3 kg    Examination:  Constitutional: NAD Eyes: no scleral icterus ENMT: Mucous membranes are moist.  Neck: normal, supple Respiratory: clear to auscultation bilaterally, no wheezing, no crackles. Normal respiratory effort. No accessory muscle use.  Cardiovascular: Regular rate and rhythm, no murmurs / rubs / gallops. No LE edema.  Abdomen: non distended, no tenderness. Bowel sounds positive.  Musculoskeletal: no clubbing / cyanosis.   Data Reviewed: I have  independently reviewed following labs and imaging studies   CBC Recent Labs  Lab 09/23/23 1120 09/24/23 1202  WBC 8.2 7.0  HGB 11.1* 10.9*  HCT 37.5 36.2  PLT 184 194  MCV 89.1 88.9  MCH 26.4 26.8  MCHC 29.6* 30.1  RDW 18.0* 18.4*  LYMPHSABS 2.6 1.7  MONOABS 0.8 0.8  EOSABS 0.3 0.2  BASOSABS 0.1 0.0    Recent Labs  Lab 09/23/23 1120 09/24/23 1202  NA 142 143  K 3.3* 3.7  CL 103 106  CO2 27 29  GLUCOSE 147* 160*  BUN 25* 21  CREATININE 1.08* 1.04*  CALCIUM 8.4* 8.8*  AST  --  8*  ALT  --  10  ALKPHOS  --  100  BILITOT  --  0.2*  ALBUMIN  --  2.2*  MG  --  2.0  HGBA1C  --  6.4*    ------------------------------------------------------------------------------------------------------------------ No results for  input(s): "CHOL", "HDL", "LDLCALC", "TRIG", "CHOLHDL", "LDLDIRECT" in the last 72 hours.  Lab Results  Component Value Date   HGBA1C 6.4 (H) 09/24/2023   ------------------------------------------------------------------------------------------------------------------ No results for input(s): "TSH", "T4TOTAL", "T3FREE", "THYROIDAB" in the last 72 hours.  Invalid input(s): "FREET3"  Cardiac Enzymes No results for input(s): "CKMB", "TROPONINI", "MYOGLOBIN" in the last 168 hours.  Invalid input(s): "CK" ------------------------------------------------------------------------------------------------------------------ No results found for: "BNP"  CBG: Recent Labs  Lab 09/24/23 0825 09/24/23 1107 09/24/23 1550 09/24/23 2318 09/25/23 0834  GLUCAP 107* 122* 146* 117* 95    Recent Results (from the past 240 hour(s))  Urine Culture     Status: Abnormal (Preliminary result)   Collection Time: 09/23/23  1:22 PM   Specimen: Urine, Clean Catch  Result Value Ref Range Status   Specimen Description   Final    URINE, CLEAN CATCH Performed at [facility], [address], [place], [place] [address]    Special Requests   Final    NONE Performed at  [facility], [address] [place], [place] [address]    Culture (A)  Final    30,000 COLONIES/mL GRAM NEGATIVE RODS IDENTIFICATION AND SUSCEPTIBILITIES TO FOLLOW Performed at [facility], [address] [place], [place] [address]    Report Status PENDING  Incomplete     Radiology Studies: No results found.  Pamella Pert, MD, PhD [facility]  Between 7 am - 7 pm I am available, please contact me via Amion (for emergencies) or Securechat (non urgent messages)  Between 7 pm - 7 am I am not available, please contact night coverage MD/APP via Amion

## 2023-09-26 DIAGNOSIS — M869 Osteomyelitis, unspecified: Secondary | ICD-10-CM | POA: Diagnosis not present

## 2023-09-26 LAB — COMPREHENSIVE METABOLIC PANEL
ALT: 13 U/L (ref 0–44)
AST: 10 U/L — ABNORMAL LOW (ref 15–41)
Albumin: 2.2 g/dL — ABNORMAL LOW (ref 3.5–5.0)
Alkaline Phosphatase: 98 U/L (ref 38–126)
Anion gap: 6 (ref 5–15)
BUN: 21 mg/dL (ref 8–23)
CO2: 29 mmol/L (ref 22–32)
Calcium: 8.4 mg/dL — ABNORMAL LOW (ref 8.9–10.3)
Chloride: 108 mmol/L (ref 98–111)
Creatinine, Ser: 1.17 mg/dL — ABNORMAL HIGH (ref 0.44–1.00)
GFR, Estimated: 47 mL/min — ABNORMAL LOW (ref >60)
Glucose, Bld: 100 mg/dL — ABNORMAL HIGH (ref 70–99)
Potassium: 4.1 mmol/L (ref 3.5–5.1)
Sodium: 143 mmol/L (ref 135–145)
Total Bilirubin: 0.6 mg/dL (ref 0.3–1.2)
Total Protein: 6.4 g/dL — ABNORMAL LOW (ref 6.5–8.1)

## 2023-09-26 LAB — URINE CULTURE: Culture: 30000 — AB

## 2023-09-26 LAB — CBC
HCT: 36.1 % (ref 36.0–46.0)
Hemoglobin: 10.8 g/dL — ABNORMAL LOW (ref 12.0–15.0)
MCH: 26.5 pg (ref 26.0–34.0)
MCHC: 29.9 g/dL — ABNORMAL LOW (ref 30.0–36.0)
MCV: 88.5 fL (ref 80.0–100.0)
Platelets: 187 10*3/uL (ref 150–400)
RBC: 4.08 MIL/uL (ref 3.87–5.11)
RDW: 17.8 % — ABNORMAL HIGH (ref 11.5–15.5)
WBC: 7.9 10*3/uL (ref 4.0–10.5)
nRBC: 0 % (ref 0.0–0.2)

## 2023-09-26 LAB — MAGNESIUM: Magnesium: 2 mg/dL (ref 1.7–2.4)

## 2023-09-26 LAB — GLUCOSE, CAPILLARY
Glucose-Capillary: 123 mg/dL — ABNORMAL HIGH (ref 70–99)
Glucose-Capillary: 116 mg/dL — ABNORMAL HIGH (ref 70–99)

## 2023-09-26 MED ORDER — CLONAZEPAM 0.5 MG PO TABS: 0.5000 mg | ORAL_TABLET | Freq: Three times a day (TID) | ORAL | 0 refills | Status: DC

## 2023-09-26 NOTE — Plan of Care (Signed)
  Problem: Education: Goal: Knowledge of General Education information will improve Description: Including pain rating scale, medication(s)/side effects and non-pharmacologic comfort measures 09/26/2023 1024 by Juluis Mire, RN Outcome: Adequate for Discharge 09/26/2023 1024 by Juluis Mire, RN Outcome: Adequate for Discharge   Problem: Health Behavior/Discharge Planning: Goal: Ability to manage health-related needs will improve 09/26/2023 1024 by Juluis Mire, RN Outcome: Adequate for Discharge 09/26/2023 1024 by Juluis Mire, RN Outcome: Adequate for Discharge   Problem: Clinical Measurements: Goal: Ability to maintain clinical measurements within normal limits will improve 09/26/2023 1024 by Juluis Mire, RN Outcome: Adequate for Discharge 09/26/2023 1024 by Juluis Mire, RN Outcome: Adequate for Discharge Goal: Will remain free from infection 09/26/2023 1024 by Juluis Mire, RN Outcome: Adequate for Discharge 09/26/2023 1024 by Juluis Mire, RN Outcome: Adequate for Discharge Goal: Diagnostic test results will improve 09/26/2023 1024 by Juluis Mire, RN Outcome: Adequate for Discharge 09/26/2023 1024 by Juluis Mire, RN Outcome: Adequate for Discharge Goal: Respiratory complications will improve 09/26/2023 1024 by Juluis Mire, RN Outcome: Adequate for Discharge 09/26/2023 1024 by Juluis Mire, RN Outcome: Adequate for Discharge Goal: Cardiovascular complication will be avoided 09/26/2023 1024 by Juluis Mire, RN Outcome: Adequate for Discharge 09/26/2023 1024 by Juluis Mire, RN Outcome: Adequate for Discharge   Problem: Activity: Goal: Risk for activity intolerance will decrease 09/26/2023 1024 by Juluis Mire, RN Outcome: Adequate for Discharge 09/26/2023 1024 by Juluis Mire, RN Outcome: Adequate for Discharge   Problem: Nutrition: Goal: Adequate nutrition will be  maintained 09/26/2023 1024 by Juluis Mire, RN Outcome: Adequate for Discharge 09/26/2023 1024 by Juluis Mire, RN Outcome: Adequate for Discharge   Problem: Coping: Goal: Level of anxiety will decrease 09/26/2023 1024 by Juluis Mire, RN Outcome: Adequate for Discharge 09/26/2023 1024 by Juluis Mire, RN Outcome: Adequate for Discharge   Problem: Elimination: Goal: Will not experience complications related to bowel motility 09/26/2023 1024 by Juluis Mire, RN Outcome: Adequate for Discharge 09/26/2023 1024 by Juluis Mire, RN Outcome: Adequate for Discharge Goal: Will not experience complications related to urinary retention 09/26/2023 1024 by Juluis Mire, RN Outcome: Adequate for Discharge 09/26/2023 1024 by Juluis Mire, RN Outcome: Adequate for Discharge   Problem: Pain Managment: Goal: General experience of comfort will improve 09/26/2023 1024 by Juluis Mire, RN Outcome: Adequate for Discharge 09/26/2023 1024 by Juluis Mire, RN Outcome: Adequate for Discharge   Problem: Safety: Goal: Ability to remain free from injury will improve 09/26/2023 1024 by Juluis Mire, RN Outcome: Adequate for Discharge 09/26/2023 1024 by Juluis Mire, RN Outcome: Adequate for Discharge   Problem: Skin Integrity: Goal: Risk for impaired skin integrity will decrease 09/26/2023 1024 by Juluis Mire, RN Outcome: Adequate for Discharge 09/26/2023 1024 by Juluis Mire, RN Outcome: Adequate for Discharge

## 2023-09-26 NOTE — TOC Transition Note (Signed)
Transition of Care [facility]) - CM/SW Discharge Note   Patient Details  Name: Katelyn Ballard MRN: [id number] Date of Birth: 1945-01-04  [facility] of [facility]) CM/SW Contact:  Lorri Frederick, LCSW Phone Number: 09/26/2023, 10:15 AM   Clinical Narrative:   Pt discharging to [facility].  RN call report to [phone].   1000: CSW confirmed with [facility] that they can receive pt today.     Final next level of care: Long Term Nursing Home Barriers to Discharge: Barriers Resolved   Patient Goals and CMS Choice      Discharge Placement                Patient chooses bed at: [facility] Patient to be transferred to facility by: ptar Name of family member notified: daughter Selena Batten Patient and family notified of of transfer: 09/26/23  Discharge Plan and Services Additional resources added to the After Visit Summary for   In-house Referral: Clinical Social Work   Post Acute Care Choice: Nursing Home                               Social Determinants of Health (SDOH) Interventions SDOH Screenings   Food Insecurity: Unknown (09/24/2023)  Utilities: Patient Unable To Answer (09/24/2023)  Depression (PHQ2-9): Medium Risk (02/05/2022)  Financial Resource Strain: Low Risk  (02/05/2022)   Received from [facility], [facility]  Physical Activity: Insufficiently Active (02/05/2022)  Social Connections: Unknown (05/06/2022)   Received from [facility], [facility]  Stress: No Stress Concern Present (02/05/2022)   Received from [facility], [facility]  Recent Concern: Stress - Stress Concern Present (02/05/2022)  Tobacco Use: Medium Risk (09/23/2023)     Readmission Risk Interventions     No data to display

## 2023-09-26 NOTE — Plan of Care (Signed)
  Problem: Education: Goal: Knowledge of General Education information will improve Description: Including pain rating scale, medication(s)/side effects and non-pharmacologic comfort measures Outcome: Not Progressing   Problem: Health Behavior/Discharge Planning: Goal: Ability to manage health-related needs will improve Outcome: Not Progressing   Problem: Clinical Measurements: Goal: Ability to maintain clinical measurements within normal limits will improve Outcome: Not Progressing Goal: Will remain free from infection Outcome: Not Progressing Goal: Diagnostic test results will improve Outcome: Not Progressing Goal: Respiratory complications will improve Outcome: Not Progressing Goal: Cardiovascular complication will be avoided Outcome: Not Progressing   Problem: Pain Managment: Goal: General experience of comfort will improve Outcome: Not Progressing   Problem: Skin Integrity: Goal: Risk for impaired skin integrity will decrease Outcome: Not Progressing

## 2023-09-26 NOTE — Care Management Important Message (Signed)
Important Message  Patient Details  Name: Katelyn Ballard MRN: [id number] Date of Birth: April 19, 1945   Important Message Given:  Yes - Medicare IM     Sherilyn Banker 09/26/2023, 1:15 PM

## 2023-09-26 NOTE — Discharge Summary (Signed)
Physician Discharge Summary  Katelyn Ballard ZOX:[id number] DOB: 06/02/1945 DOA: 09/23/2023  PCP: Bennie Pierini, FNP  Admit date: 09/23/2023 Discharge date: 09/26/2023  Admitted From: SNF Disposition:  SNF  Recommendations for Outpatient Follow-up:  Follow up with PCP in 1-2 weeks  Home Health: none Equipment/Devices: none  Discharge Condition: stable CODE STATUS: DNR  HPI: Per admitting MD, Katelyn Ballard is a 78 y.o. female with medical history significant of diabetes mellitus type 2, hyperlipidemia, hypertension, CVA, and more presents the ER with a chief complaint of fall.  Unfortunately due to patient's dementia she is not able to provide any history.  She does not recognize her daughter at bedside.  She does know her name and where she is, but not year or president.  Daughter at bedside reports that she saw patient yesterday and she was her normal self.  It is hard to establish a baseline mentation with this patient as she has volatile moods and waxing and waning confusion with her dementia.  Daughter said that yesterday seem to be a good day.  The facility then called daughter this a.m. and said the patient fell sometime during the night.  Patient is wheelchair-bound and does not walk.  It is unclear what her mechanism of her fall was.  She does have bruising on her forehead that makes it look like she took a nose dive out of bed.  Daughter asked the facility to FaceTime her, and when she saw the bruising on the forehead she requested transport to the ER.  In the ER patient had no acute fracture in the C-spine but she did have progressive erosions at C7 and T1.  MRI confirmed osteomyelitis/discitis.  Infectious disease was consulted and recommended no antibiotics at this time, transfer to [facility], IR biopsy of affected area.  CT head showed no acute intracranial abnormality.  It did show a frontal scalp hematoma.  Chest x-ray showed an atypical focal opacity in the left lung  base.  Patient has no leukocytosis.  Patient reports at this time she is not in any pain.  Daughter reports that patient has been complaining of knee pain.  Right knee x-ray shows no acute fracture or dislocation.  She does have a small abrasion on the right knee.  The pain was improved with Tylenol that she was given in the ED.  Patient has no other complaints at this time.   Hospital Course / Discharge diagnoses: Principal Problem:   Osteomyelitis (HCC) Active Problems:   Hyperlipidemia with target LDL less than 100   TIA (transient ischemic attack)   Type 2 diabetes mellitus without complication, without long-term current use of insulin (HCC)   Hypokalemia   Sundowning   Multilevel degenerative disc disease   Principal problem Concern for osteomyelitis - ID consulted, it is felt like the MRI findings represent chronic issues rather than true infectious process, and recommending no antibiotics at this time. She is afebrile and has no leukocytosis. MRI shows findings consistent with discitis osteomyelitis at C7-T1.  No evidence of epidural abscess.  IR consulted, and given the likelihood of a low yield for culture, risks of the procedure outweigh potential benefits, for now hold biopsy.  Remains afebrile. Will need repeat imaging of the C spine in 4 weeks.   Active problems  Dementia, sundowning - Patient from SNF memory care unit. Continue Depakote and Klonopin  Hypokalemia - Replaced.  Type 2 diabetes mellitus - without complication, without long-term current use of insulin (HCC) History  of TIA (transient ischemic attack) - Restart aspirin, plavix and statin on discharge.  Hyperlipidemia with target LDL less than 100 - Continue statin Dementia - No agitation  Sepsis ruled out   Discharge Instructions   Allergies as of 09/26/2023       Reactions   Atorvastatin Other (See Comments)   myalgias   Shellfish Allergy    Other reaction(s): Unknown   Penicillins         Medication  List     TAKE these medications    allopurinol 300 MG tablet Commonly known as: ZYLOPRIM Take 1 tablet (300 mg total) by mouth daily. What changed: how much to take   aspirin 81 MG chewable tablet Chew 81 mg by mouth in the morning.   blood glucose meter kit and supplies Check blood sugar bid and as needed Dx: E11.42   blood glucose meter kit and supplies Dispense based on patient and insurance preference. Use up to four times daily as directed. (FOR ICD-10 E10.9, E11.9).   cetirizine 10 MG tablet Commonly known as: ZYRTEC Take 10 mg by mouth daily.   Cholecalciferol 50 MCG (2000 UT) Caps Take 2,000 Units by mouth daily.   clonazePAM 0.5 MG tablet Commonly known as: KLONOPIN Take 1 tablet (0.5 mg total) by mouth in the morning, at noon, and at bedtime.   clopidogrel 75 MG tablet Commonly known as: PLAVIX Take 75 mg by mouth in the morning.   divalproex 125 MG capsule Commonly known as: DEPAKOTE SPRINKLE Take 375 mg by mouth 3 (three) times daily.   escitalopram 10 MG tablet Commonly known as: LEXAPRO Take 10 mg by mouth daily.   furosemide 20 MG tablet Commonly known as: LASIX Take 20 mg by mouth daily.   loperamide 2 MG capsule Commonly known as: IMODIUM Take 4 mg by mouth as needed for diarrhea or loose stools. Do not exceed 4 caps in 24 hours.   methocarbamol 500 MG tablet Commonly known as: ROBAXIN Take 500 mg by mouth every evening.   metoprolol tartrate 25 MG tablet Commonly known as: LOPRESSOR Take 12.5 mg by mouth 2 (two) times daily.   mirtazapine 15 MG tablet Commonly known as: REMERON Take 15 mg by mouth in the morning and at bedtime.   OneTouch Ultra test strip Generic drug: glucose blood USE 1 STRIP TO CHECK GLUCOSE 2 TO 3 TIMES DAILY   oxybutynin 5 MG tablet Commonly known as: DITROPAN Take 5 mg by mouth at bedtime.   rosuvastatin 40 MG tablet Commonly known as: CRESTOR Take 40 mg by mouth every evening.   sennosides-docusate  sodium 8.6-50 MG tablet Commonly known as: SENOKOT-S Take 2 tablets by mouth in the morning.   Trulicity 0.75 MG/0.5ML Sopn Generic drug: Dulaglutide Inject 0.75 mg into the skin once a week. Saturdays.       Consultations: ID  Procedures/Studies:  CT Chest W Contrast  Result Date: 09/23/2023 CLINICAL DATA:  Pneumonia, complication suspected, xray done. Recent fall EXAM: CT CHEST WITH CONTRAST TECHNIQUE: Multidetector CT imaging of the chest was performed during intravenous contrast administration. RADIATION DOSE REDUCTION: This exam was performed according to the departmental dose-optimization program which includes automated exposure control, adjustment of the mA and/or kV according to patient size and/or use of iterative reconstruction technique. CONTRAST:  75mL OMNIPAQUE IOHEXOL 300 MG/ML  SOLN COMPARISON:  X-ray 09/23/2023 FINDINGS: Cardiovascular: Mild cardiomegaly. No pericardial effusion. Thoracic aorta is nonaneurysmal. Scattered atherosclerotic vascular calcifications of the aorta and coronary arteries. Central pulmonary vasculature  is mildly dilated. Mediastinum/Nodes: No enlarged mediastinal, hilar, or axillary lymph nodes. Thyroid gland, trachea, and esophagus demonstrate no significant findings. Prominent epicardial fat pad on the left (series 4, image 63). Lungs/Pleura: Mild centrilobular emphysema. Mild left basilar subsegmental atelectasis. Lungs are otherwise clear. No pleural effusion or pneumothorax. Upper Abdomen: Cholelithiasis.  No acute abnormality. Musculoskeletal: No chest wall abnormality. No acute or significant osseous findings. Moderate-advanced multilevel thoracic spondylosis. IMPRESSION: 1. Prominent epicardial fat pad on the left corresponding to opacity described on previous chest x-ray. 2. Mild left basilar subsegmental atelectasis. Lungs are otherwise clear. 3. Mild cardiomegaly with mildly dilated central pulmonary vasculature. 4. Cholelithiasis. 5. Aortic and  coronary artery atherosclerosis (ICD10-I70.0). 6. Mild emphysema (ICD10-J43.9). Electronically Signed   By: Duanne Guess D.O.   On: 09/23/2023 18:28   MR Cervical Spine W and Wo Contrast  Result Date: 09/23/2023 CLINICAL DATA:  Osteomyelitis cervical. EXAM: MRI CERVICAL SPINE WITHOUT AND WITH CONTRAST TECHNIQUE: Multiplanar and multiecho pulse sequences of the cervical spine, to include the craniocervical junction and cervicothoracic junction, were obtained without and with intravenous contrast. CONTRAST:  7mL GADAVIST GADOBUTROL 1 MMOL/ML IV SOLN COMPARISON:  CT cervical spine September 23, 2023. FINDINGS: The study is partially degraded by motion. Alignment: Trace anterolisthesis of C7 over T1 and T1 over T2. Vertebrae: Endplate erosion at C7-T1, correlating with findings of recent CT. There is associated marrow edema and contrast enhancement of the C7 and T1 vertebral bodies as well as mild prevertebral soft tissue edema and enhancement. No evidence of epidural abscess. Cord: Grossly unremarkable. Posterior Fossa, vertebral arteries, paraspinal tissues: No additional relevant findings. Disc levels: C2-3: No significant spinal canal or neural foraminal stenosis. C3-4: Posterior disc osteophyte complex without significant spinal canal stenosis. Uncovertebral and facet degenerative changes resulting in severe bilateral neural foraminal narrowing. C4-5: Posterior disc osteophyte complex without significant spinal canal stenosis. Uncovertebral and facet degenerative changes [place] moderate bilateral neural foraminal narrowing. C5-6: Posterior disc osteophyte complex resulting in mild spinal canal stenosis. Uncovertebral and facet degenerative change resulting mild right and severe left neural foraminal narrowing. C6-7: Posterior disc osteophyte complex resulting in mild spinal canal stenosis. Uncovertebral and facet degenerative changes Ult in moderate right and severe left neural foraminal narrowing. C7-T1:  Small posterior disc osteophyte complex without significant spinal canal stenosis. Uncovertebral and facet degenerative changes resulting in severe right and mild left neural foraminal narrowing. IMPRESSION: 1. Findings consistent with discitis/osteomyelitis at C7-T1. No evidence of epidural abscess. 2. Degenerative changes of the cervical spine with multilevel high-grade neural foraminal narrowing, as described above. 3. Mild spinal canal stenosis at C5-6 and C6-7. Electronically Signed   By: Baldemar Lenis M.D.   On: 09/23/2023 16:41   DG Chest 1 View  Result Date: 09/23/2023 CLINICAL DATA:  Back and leg pain status post fall last night. Confusion. EXAM: RIGHT KNEE - COMPLETE 4+ VIEW; CHEST 1 VIEW; PELVIS - 1-2 VIEW; LUMBAR SPINE - COMPLETE 4+ VIEW COMPARISON:  Chest radiograph 09/08/2019 FINDINGS: Chest: Heart size at upper limits of normal. No pulmonary vascular congestion. New airspace opacity seen in the left inferolateral lung. Lungs otherwise clear. Pelvis: No fracture or dislocation. Lumbar spine: Extensive atherosclerotic changes seen throughout visualized arterial segments. Grade 1 anterolisthesis of L5 on S1. Vertebral body heights otherwise maintained. Mild disc height loss at L3-L4, L4-L5, L5-S1. Endplate spurring seen throughout the lumbar spine, greatest at L1-L2. Mild-to-moderate facet degenerative changes also present throughout the lumbar spine, greatest at L4-L5. Calcifications seen in the upper abdomen  bilaterally, possibly located within the kidneys or renal pelvis, measures up to 7 mm on the right and 4 mm on the left. Right knee: Diffuse osteopenia. No fracture or dislocation. Mild thickening of the patellar tendon. IMPRESSION: 1. No acute fracture or dislocation. 2. Atypical focal opacity of the left inferolateral lung base requires further evaluation with contrast enhanced chest CT. This may be due to infection, atelectasis, or mass. 3. No acute abnormality of the lumbar  spine are pelvis. 4. Diffuse atherosclerotic calcifications of the abdominal aorta with focal dilatation measuring 2.8 cm. Further evaluation with nonemergent abdominal aorta ultrasound is recommended. 5. Mild thickening of the patellar tendon may be due to chronic tendinosis or recent injury. Electronically Signed   By: Acquanetta Belling M.D.   On: 09/23/2023 13:27   DG Lumbar Spine Complete  Result Date: 09/23/2023 CLINICAL DATA:  Back and leg pain status post fall last night. Confusion. EXAM: RIGHT KNEE - COMPLETE 4+ VIEW; CHEST 1 VIEW; PELVIS - 1-2 VIEW; LUMBAR SPINE - COMPLETE 4+ VIEW COMPARISON:  Chest radiograph 09/08/2019 FINDINGS: Chest: Heart size at upper limits of normal. No pulmonary vascular congestion. New airspace opacity seen in the left inferolateral lung. Lungs otherwise clear. Pelvis: No fracture or dislocation. Lumbar spine: Extensive atherosclerotic changes seen throughout visualized arterial segments. Grade 1 anterolisthesis of L5 on S1. Vertebral body heights otherwise maintained. Mild disc height loss at L3-L4, L4-L5, L5-S1. Endplate spurring seen throughout the lumbar spine, greatest at L1-L2. Mild-to-moderate facet degenerative changes also present throughout the lumbar spine, greatest at L4-L5. Calcifications seen in the upper abdomen bilaterally, possibly located within the kidneys or renal pelvis, measures up to 7 mm on the right and 4 mm on the left. Right knee: Diffuse osteopenia. No fracture or dislocation. Mild thickening of the patellar tendon. IMPRESSION: 1. No acute fracture or dislocation. 2. Atypical focal opacity of the left inferolateral lung base requires further evaluation with contrast enhanced chest CT. This may be due to infection, atelectasis, or mass. 3. No acute abnormality of the lumbar spine are pelvis. 4. Diffuse atherosclerotic calcifications of the abdominal aorta with focal dilatation measuring 2.8 cm. Further evaluation with nonemergent abdominal aorta  ultrasound is recommended. 5. Mild thickening of the patellar tendon may be due to chronic tendinosis or recent injury. Electronically Signed   By: Acquanetta Belling M.D.   On: 09/23/2023 13:27   DG Knee Complete 4 Views Right  Result Date: 09/23/2023 CLINICAL DATA:  Back and leg pain status post fall last night. Confusion. EXAM: RIGHT KNEE - COMPLETE 4+ VIEW; CHEST 1 VIEW; PELVIS - 1-2 VIEW; LUMBAR SPINE - COMPLETE 4+ VIEW COMPARISON:  Chest radiograph 09/08/2019 FINDINGS: Chest: Heart size at upper limits of normal. No pulmonary vascular congestion. New airspace opacity seen in the left inferolateral lung. Lungs otherwise clear. Pelvis: No fracture or dislocation. Lumbar spine: Extensive atherosclerotic changes seen throughout visualized arterial segments. Grade 1 anterolisthesis of L5 on S1. Vertebral body heights otherwise maintained. Mild disc height loss at L3-L4, L4-L5, L5-S1. Endplate spurring seen throughout the lumbar spine, greatest at L1-L2. Mild-to-moderate facet degenerative changes also present throughout the lumbar spine, greatest at L4-L5. Calcifications seen in the upper abdomen bilaterally, possibly located within the kidneys or renal pelvis, measures up to 7 mm on the right and 4 mm on the left. Right knee: Diffuse osteopenia. No fracture or dislocation. Mild thickening of the patellar tendon. IMPRESSION: 1. No acute fracture or dislocation. 2. Atypical focal opacity of the left inferolateral lung base  requires further evaluation with contrast enhanced chest CT. This may be due to infection, atelectasis, or mass. 3. No acute abnormality of the lumbar spine are pelvis. 4. Diffuse atherosclerotic calcifications of the abdominal aorta with focal dilatation measuring 2.8 cm. Further evaluation with nonemergent abdominal aorta ultrasound is recommended. 5. Mild thickening of the patellar tendon may be due to chronic tendinosis or recent injury. Electronically Signed   By: Acquanetta Belling M.D.   On:  09/23/2023 13:27   DG Pelvis 1-2 Views  Result Date: 09/23/2023 CLINICAL DATA:  Back and leg pain status post fall last night. Confusion. EXAM: RIGHT KNEE - COMPLETE 4+ VIEW; CHEST 1 VIEW; PELVIS - 1-2 VIEW; LUMBAR SPINE - COMPLETE 4+ VIEW COMPARISON:  Chest radiograph 09/08/2019 FINDINGS: Chest: Heart size at upper limits of normal. No pulmonary vascular congestion. New airspace opacity seen in the left inferolateral lung. Lungs otherwise clear. Pelvis: No fracture or dislocation. Lumbar spine: Extensive atherosclerotic changes seen throughout visualized arterial segments. Grade 1 anterolisthesis of L5 on S1. Vertebral body heights otherwise maintained. Mild disc height loss at L3-L4, L4-L5, L5-S1. Endplate spurring seen throughout the lumbar spine, greatest at L1-L2. Mild-to-moderate facet degenerative changes also present throughout the lumbar spine, greatest at L4-L5. Calcifications seen in the upper abdomen bilaterally, possibly located within the kidneys or renal pelvis, measures up to 7 mm on the right and 4 mm on the left. Right knee: Diffuse osteopenia. No fracture or dislocation. Mild thickening of the patellar tendon. IMPRESSION: 1. No acute fracture or dislocation. 2. Atypical focal opacity of the left inferolateral lung base requires further evaluation with contrast enhanced chest CT. This may be due to infection, atelectasis, or mass. 3. No acute abnormality of the lumbar spine are pelvis. 4. Diffuse atherosclerotic calcifications of the abdominal aorta with focal dilatation measuring 2.8 cm. Further evaluation with nonemergent abdominal aorta ultrasound is recommended. 5. Mild thickening of the patellar tendon may be due to chronic tendinosis or recent injury. Electronically Signed   By: Acquanetta Belling M.D.   On: 09/23/2023 13:27   CT Head Wo Contrast  Result Date: 09/23/2023 CLINICAL DATA:  Head trauma, minor (Age >= 65y); Neck trauma (Age >= 65y). Fall with bruising to right forehead,  posterior head and neck pain. EXAM: CT HEAD WITHOUT CONTRAST CT CERVICAL SPINE WITHOUT CONTRAST TECHNIQUE: Multidetector CT imaging of the head and cervical spine was performed following the standard protocol without intravenous contrast. Multiplanar CT image reconstructions of the cervical spine were also generated. RADIATION DOSE REDUCTION: This exam was performed according to the departmental dose-optimization program which includes automated exposure control, adjustment of the mA and/or kV according to patient size and/or use of iterative reconstruction technique. COMPARISON:  CT head and cervical spine 05/12/2023. FINDINGS: CT HEAD FINDINGS Brain: No acute hemorrhage. Unchanged moderate chronic small-vessel disease. Cortical gray-white differentiation is otherwise preserved. Prominence of the ventricles and sulci within expected range for age. No hydrocephalus or extra-axial collection. No mass effect or midline shift. Vascular: No hyperdense vessel or unexpected calcification. Skull: No calvarial fracture or suspicious bone lesion. Skull base is unremarkable. Sinuses/Orbits: No acute finding. Other: Right frontal scalp hematoma. CT CERVICAL SPINE FINDINGS Alignment: Normal. Skull base and vertebrae: No acute fracture. Normal craniocervical junction. Progressive endplate erosions at C7-T1, suspicious for discitis-osteomyelitis versus severe degenerative change. Soft tissues and spinal canal: No prevertebral fluid or swelling. No visible canal hematoma. Disc levels: Multilevel cervical spondylosis, worst at C5-6, where there is at least mild spinal canal stenosis. Upper chest:  No acute findings. Other: None. IMPRESSION: 1. No acute intracranial abnormality. Right frontal scalp hematoma. 2. No acute cervical spine fracture or traumatic listhesis. 3. Progressive endplate erosions at C7-T1, suspicious for discitis-osteomyelitis versus severe degenerative change. Consider MRI of the cervical spine with and without  contrast for further evaluation. Electronically Signed   By: Orvan Falconer M.D.   On: 09/23/2023 13:03   CT Cervical Spine Wo Contrast  Result Date: 09/23/2023 CLINICAL DATA:  Head trauma, minor (Age >= 65y); Neck trauma (Age >= 65y). Fall with bruising to right forehead, posterior head and neck pain. EXAM: CT HEAD WITHOUT CONTRAST CT CERVICAL SPINE WITHOUT CONTRAST TECHNIQUE: Multidetector CT imaging of the head and cervical spine was performed following the standard protocol without intravenous contrast. Multiplanar CT image reconstructions of the cervical spine were also generated. RADIATION DOSE REDUCTION: This exam was performed according to the departmental dose-optimization program which includes automated exposure control, adjustment of the mA and/or kV according to patient size and/or use of iterative reconstruction technique. COMPARISON:  CT head and cervical spine 05/12/2023. FINDINGS: CT HEAD FINDINGS Brain: No acute hemorrhage. Unchanged moderate chronic small-vessel disease. Cortical gray-white differentiation is otherwise preserved. Prominence of the ventricles and sulci within expected range for age. No hydrocephalus or extra-axial collection. No mass effect or midline shift. Vascular: No hyperdense vessel or unexpected calcification. Skull: No calvarial fracture or suspicious bone lesion. Skull base is unremarkable. Sinuses/Orbits: No acute finding. Other: Right frontal scalp hematoma. CT CERVICAL SPINE FINDINGS Alignment: Normal. Skull base and vertebrae: No acute fracture. Normal craniocervical junction. Progressive endplate erosions at C7-T1, suspicious for discitis-osteomyelitis versus severe degenerative change. Soft tissues and spinal canal: No prevertebral fluid or swelling. No visible canal hematoma. Disc levels: Multilevel cervical spondylosis, worst at C5-6, where there is at least mild spinal canal stenosis. Upper chest: No acute findings. Other: None. IMPRESSION: 1. No acute  intracranial abnormality. Right frontal scalp hematoma. 2. No acute cervical spine fracture or traumatic listhesis. 3. Progressive endplate erosions at C7-T1, suspicious for discitis-osteomyelitis versus severe degenerative change. Consider MRI of the cervical spine with and without contrast for further evaluation. Electronically Signed   By: Orvan Falconer M.D.   On: 09/23/2023 13:03     Subjective: - no chest pain, shortness of breath, no abdominal pain, nausea or vomiting.   Discharge Exam: BP (!) 147/78 (BP Location: Right Arm)   Pulse 95   Temp 98.8 F (37.1 C)   Resp 19   Ht 5\' 3"  (1.6 m)   SpO2 93%   BMI 27.46 kg/m   General: Pt is alert, awake, not in acute distress Cardiovascular: RRR, S1/S2 +, no rubs, no gallops Respiratory: CTA bilaterally, no wheezing, no rhonchi Abdominal: Soft, NT, ND, bowel sounds + Extremities: no edema, no cyanosis    The results of significant diagnostics from this hospitalization (including imaging, microbiology, ancillary and laboratory) are listed below for reference.     Microbiology: Recent Results (from the past 240 hour(s))  Urine Culture     Status: Abnormal   Collection Time: 09/23/23  1:22 PM   Specimen: Urine, Clean Catch  Result Value Ref Range Status   Specimen Description   Final    URINE, CLEAN CATCH Performed at [facility], [address] [place], [place] [address]    Special Requests   Final    NONE Performed at [facility], [address] [place], [place] [address]    Culture (A)  Final    30,000 COLONIES/mL KLEBSIELLA PNEUMONIAE  Two isolates with different morphologies were identified as the same organism.The most resistant organism was reported. Performed at [facility], [address] [place], [place] [address]    Report Status 09/26/2023 FINAL  Final   Organism ID, Bacteria KLEBSIELLA PNEUMONIAE (A)  Final      Susceptibility   Klebsiella pneumoniae - MIC*    AMPICILLIN >=32 RESISTANT Resistant      CEFAZOLIN <=4 SENSITIVE Sensitive     CEFEPIME <=0.12 SENSITIVE Sensitive     CEFTRIAXONE <=0.25 SENSITIVE Sensitive     CIPROFLOXACIN <=0.25 SENSITIVE Sensitive     GENTAMICIN <=1 SENSITIVE Sensitive     IMIPENEM <=0.25 SENSITIVE Sensitive     NITROFURANTOIN 64 INTERMEDIATE Intermediate     TRIMETH/SULFA <=20 SENSITIVE Sensitive     AMPICILLIN/SULBACTAM 4 SENSITIVE Sensitive     PIP/TAZO <=4 SENSITIVE Sensitive     * 30,000 COLONIES/mL KLEBSIELLA PNEUMONIAE     Labs: Basic Metabolic Panel: Recent Labs  Lab 09/23/23 1120 09/24/23 1202 09/26/23 0451  NA 142 143 143  K 3.3* 3.7 4.1  CL 103 106 108  CO2 27 29 29   GLUCOSE 147* 160* 100*  BUN 25* 21 21  CREATININE 1.08* 1.04* 1.17*  CALCIUM 8.4* 8.8* 8.4*  MG  --  2.0 2.0   Liver Function Tests: Recent Labs  Lab 09/24/23 1202 09/26/23 0451  AST 8* 10*  ALT 10 13  ALKPHOS 100 98  BILITOT 0.2* 0.6  PROT 6.4* 6.4*  ALBUMIN 2.2* 2.2*   CBC: Recent Labs  Lab 09/23/23 1120 09/24/23 1202 09/26/23 0451  WBC 8.2 7.0 7.9  NEUTROABS 4.5 4.2  --   HGB 11.1* 10.9* 10.8*  HCT 37.5 36.2 36.1  MCV 89.1 88.9 88.5  PLT 184 194 187   CBG: Recent Labs  Lab 09/25/23 0834 09/25/23 1223 09/25/23 1739 09/25/23 2300 09/26/23 0756  GLUCAP 95 201* 101* 144* 123*   Hgb A1c Recent Labs    09/24/23 1202  HGBA1C 6.4*   Lipid Profile No results for input(s): "CHOL", "HDL", "LDLCALC", "TRIG", "CHOLHDL", "LDLDIRECT" in the last 72 hours. Thyroid function studies No results for input(s): "TSH", "T4TOTAL", "T3FREE", "THYROIDAB" in the last 72 hours.  Invalid input(s): "FREET3" Urinalysis    Component Value Date/Time   COLORURINE STRAW (A) 09/23/2023 1322   APPEARANCEUR CLEAR 09/23/2023 1322   LABSPEC 1.005 09/23/2023 1322   PHURINE 5.0 09/23/2023 1322   GLUCOSEU NEGATIVE 09/23/2023 1322   HGBUR LARGE (A) 09/23/2023 1322   BILIRUBINUR NEGATIVE 09/23/2023 1322   KETONESUR NEGATIVE 09/23/2023 1322   PROTEINUR NEGATIVE  09/23/2023 1322   NITRITE NEGATIVE 09/23/2023 1322   LEUKOCYTESUR TRACE (A) 09/23/2023 1322    FURTHER DISCHARGE INSTRUCTIONS:   Get Medicines reviewed and adjusted: Please take all your medications with you for your next visit with your Primary MD   Laboratory/radiological data: Please request your Primary MD to go over all hospital tests and procedure/radiological results at the follow up, please ask your Primary MD to get all Hospital records sent to his/her office.   In some cases, they will be blood work, cultures and biopsy results pending at the time of your discharge. Please request that your primary care M.D. goes through all the records of your hospital data and follows up on these results.   Also Note the following: If you experience worsening of your admission symptoms, develop shortness of breath, life threatening emergency, suicidal or homicidal thoughts you must seek medical attention immediately by calling 911 or  calling your MD immediately  if symptoms less severe.   You must read complete instructions/literature along with all the possible adverse reactions/side effects for all the Medicines you take and that have been prescribed to you. Take any new Medicines after you have completely understood and accpet all the possible adverse reactions/side effects.    Do not drive when taking Pain medications or sleeping medications (Benzodaizepines)   Do not take more than prescribed Pain, Sleep and Anxiety Medications. It is not advisable to combine anxiety,sleep and pain medications without talking with your primary care practitioner   Special Instructions: If you have smoked or chewed Tobacco  in the last 2 yrs please stop smoking, stop any regular Alcohol  and or any Recreational drug use.   Wear Seat belts while driving.   Please note: You were cared for by a hospitalist during your hospital stay. Once you are discharged, your primary care physician will handle any further  medical issues. Please note that NO REFILLS for any discharge medications will be authorized once you are discharged, as it is imperative that you return to your primary care physician (or establish a relationship with a primary care physician if you do not have one) for your post hospital discharge needs so that they can reassess your need for medications and monitor your lab values.  Time coordinating discharge: 35 minutes  SIGNED:  Pamella Pert, MD, PhD 09/26/2023, 9:27 AM

## 2023-09-26 NOTE — Progress Notes (Signed)
PT Cancellation Note  Patient Details Name: Katelyn Ballard MRN: [id number] DOB: Dec 31, 1945   Cancelled Treatment:    Reason Eval/Treat Not Completed: Other (comment) After chart review, pt from memory care long term care at [facility], not rehab, and plan is for patient to return to long term care when medically cleared for d/c. Per note from Child psychotherapist, "no PT eval, pt will return LTC, not rehab." No acute PT needs identified at this time but please feel free to re-consult if change in status or needs.   Vickki Muff, PT, DPT   Acute Rehabilitation Department Office [phone] Secure Chat Communication Preferred    Ronnie Derby 09/26/2023, 7:48 AM

## 2023-09-30 DIAGNOSIS — Z79899 Other long term (current) drug therapy: Secondary | ICD-10-CM | POA: Diagnosis not present

## 2023-09-30 DIAGNOSIS — R52 Pain, unspecified: Secondary | ICD-10-CM | POA: Diagnosis not present

## 2023-09-30 DIAGNOSIS — Z9181 History of falling: Secondary | ICD-10-CM | POA: Diagnosis not present

## 2023-09-30 DIAGNOSIS — R293 Abnormal posture: Secondary | ICD-10-CM | POA: Diagnosis not present

## 2023-09-30 DIAGNOSIS — G309 Alzheimer's disease, unspecified: Secondary | ICD-10-CM | POA: Diagnosis not present

## 2023-10-01 ENCOUNTER — Other Ambulatory Visit (HOSPITAL_COMMUNITY): Payer: Self-pay | Admitting: Emergency Medicine

## 2023-10-01 DIAGNOSIS — Z79899 Other long term (current) drug therapy: Secondary | ICD-10-CM | POA: Diagnosis not present

## 2023-10-01 DIAGNOSIS — G309 Alzheimer's disease, unspecified: Secondary | ICD-10-CM | POA: Diagnosis not present

## 2023-10-01 DIAGNOSIS — N390 Urinary tract infection, site not specified: Secondary | ICD-10-CM | POA: Diagnosis not present

## 2023-10-01 DIAGNOSIS — M869 Osteomyelitis, unspecified: Secondary | ICD-10-CM

## 2023-10-01 DIAGNOSIS — R293 Abnormal posture: Secondary | ICD-10-CM | POA: Diagnosis not present

## 2023-10-02 DIAGNOSIS — I5032 Chronic diastolic (congestive) heart failure: Secondary | ICD-10-CM | POA: Diagnosis not present

## 2023-10-02 DIAGNOSIS — I10 Essential (primary) hypertension: Secondary | ICD-10-CM | POA: Diagnosis not present

## 2023-10-02 DIAGNOSIS — Z9181 History of falling: Secondary | ICD-10-CM | POA: Diagnosis not present

## 2023-10-02 DIAGNOSIS — G309 Alzheimer's disease, unspecified: Secondary | ICD-10-CM | POA: Diagnosis not present

## 2023-10-02 DIAGNOSIS — I2510 Atherosclerotic heart disease of native coronary artery without angina pectoris: Secondary | ICD-10-CM | POA: Diagnosis not present

## 2023-10-02 DIAGNOSIS — E785 Hyperlipidemia, unspecified: Secondary | ICD-10-CM | POA: Diagnosis not present

## 2023-10-02 DIAGNOSIS — M869 Osteomyelitis, unspecified: Secondary | ICD-10-CM | POA: Diagnosis not present

## 2023-10-02 DIAGNOSIS — E11649 Type 2 diabetes mellitus with hypoglycemia without coma: Secondary | ICD-10-CM | POA: Diagnosis not present

## 2023-10-02 DIAGNOSIS — M109 Gout, unspecified: Secondary | ICD-10-CM | POA: Diagnosis not present

## 2023-10-02 DIAGNOSIS — E559 Vitamin D deficiency, unspecified: Secondary | ICD-10-CM | POA: Diagnosis not present

## 2023-10-03 DIAGNOSIS — R293 Abnormal posture: Secondary | ICD-10-CM | POA: Diagnosis not present

## 2023-10-03 DIAGNOSIS — G309 Alzheimer's disease, unspecified: Secondary | ICD-10-CM | POA: Diagnosis not present

## 2023-10-07 DIAGNOSIS — N390 Urinary tract infection, site not specified: Secondary | ICD-10-CM | POA: Diagnosis not present

## 2023-10-07 DIAGNOSIS — E7849 Other hyperlipidemia: Secondary | ICD-10-CM | POA: Diagnosis not present

## 2023-10-07 DIAGNOSIS — Z79899 Other long term (current) drug therapy: Secondary | ICD-10-CM | POA: Diagnosis not present

## 2023-10-07 DIAGNOSIS — D649 Anemia, unspecified: Secondary | ICD-10-CM | POA: Diagnosis not present

## 2023-10-07 DIAGNOSIS — E119 Type 2 diabetes mellitus without complications: Secondary | ICD-10-CM | POA: Diagnosis not present

## 2023-10-07 DIAGNOSIS — N1832 Chronic kidney disease, stage 3b: Secondary | ICD-10-CM | POA: Diagnosis not present

## 2023-10-07 DIAGNOSIS — I10 Essential (primary) hypertension: Secondary | ICD-10-CM | POA: Diagnosis not present

## 2023-10-07 DIAGNOSIS — Z8673 Personal history of transient ischemic attack (TIA), and cerebral infarction without residual deficits: Secondary | ICD-10-CM | POA: Diagnosis not present

## 2023-10-07 DIAGNOSIS — I2510 Atherosclerotic heart disease of native coronary artery without angina pectoris: Secondary | ICD-10-CM | POA: Diagnosis not present

## 2023-10-07 DIAGNOSIS — R293 Abnormal posture: Secondary | ICD-10-CM | POA: Diagnosis not present

## 2023-10-07 DIAGNOSIS — I340 Nonrheumatic mitral (valve) insufficiency: Secondary | ICD-10-CM | POA: Diagnosis not present

## 2023-10-07 DIAGNOSIS — G309 Alzheimer's disease, unspecified: Secondary | ICD-10-CM | POA: Diagnosis not present

## 2023-10-08 DIAGNOSIS — R293 Abnormal posture: Secondary | ICD-10-CM | POA: Diagnosis not present

## 2023-10-08 DIAGNOSIS — E785 Hyperlipidemia, unspecified: Secondary | ICD-10-CM | POA: Diagnosis not present

## 2023-10-08 DIAGNOSIS — G309 Alzheimer's disease, unspecified: Secondary | ICD-10-CM | POA: Diagnosis not present

## 2023-10-08 DIAGNOSIS — I10 Essential (primary) hypertension: Secondary | ICD-10-CM | POA: Diagnosis not present

## 2023-10-08 DIAGNOSIS — I2510 Atherosclerotic heart disease of native coronary artery without angina pectoris: Secondary | ICD-10-CM | POA: Diagnosis not present

## 2023-10-08 DIAGNOSIS — I5032 Chronic diastolic (congestive) heart failure: Secondary | ICD-10-CM | POA: Diagnosis not present

## 2023-10-10 DIAGNOSIS — G309 Alzheimer's disease, unspecified: Secondary | ICD-10-CM | POA: Diagnosis not present

## 2023-10-10 DIAGNOSIS — R293 Abnormal posture: Secondary | ICD-10-CM | POA: Diagnosis not present

## 2023-10-13 DIAGNOSIS — R293 Abnormal posture: Secondary | ICD-10-CM | POA: Diagnosis not present

## 2023-10-13 DIAGNOSIS — G309 Alzheimer's disease, unspecified: Secondary | ICD-10-CM | POA: Diagnosis not present

## 2023-10-14 DIAGNOSIS — G309 Alzheimer's disease, unspecified: Secondary | ICD-10-CM | POA: Diagnosis not present

## 2023-10-14 DIAGNOSIS — D649 Anemia, unspecified: Secondary | ICD-10-CM | POA: Diagnosis not present

## 2023-10-14 DIAGNOSIS — Z79899 Other long term (current) drug therapy: Secondary | ICD-10-CM | POA: Diagnosis not present

## 2023-10-14 DIAGNOSIS — R293 Abnormal posture: Secondary | ICD-10-CM | POA: Diagnosis not present

## 2023-10-15 DIAGNOSIS — Z79899 Other long term (current) drug therapy: Secondary | ICD-10-CM | POA: Diagnosis not present

## 2023-10-15 DIAGNOSIS — E870 Hyperosmolality and hypernatremia: Secondary | ICD-10-CM | POA: Diagnosis not present

## 2023-10-15 DIAGNOSIS — E860 Dehydration: Secondary | ICD-10-CM | POA: Diagnosis not present

## 2023-10-16 ENCOUNTER — Emergency Department (HOSPITAL_COMMUNITY): Payer: Medicare Other

## 2023-10-16 ENCOUNTER — Emergency Department (HOSPITAL_COMMUNITY)
Admission: EM | Admit: 2023-10-16 | Discharge: 2023-10-17 | Disposition: A | Discharge status: Home / Self Care | Payer: Medicare Other | Attending: Emergency Medicine | Admitting: Emergency Medicine

## 2023-10-16 ENCOUNTER — Encounter (HOSPITAL_COMMUNITY): Payer: Self-pay | Admitting: Emergency Medicine

## 2023-10-16 DIAGNOSIS — M5090 Cervical disc disorder, unspecified, unspecified cervical region: Secondary | ICD-10-CM | POA: Diagnosis not present

## 2023-10-16 DIAGNOSIS — G309 Alzheimer's disease, unspecified: Secondary | ICD-10-CM | POA: Diagnosis not present

## 2023-10-16 DIAGNOSIS — M109 Gout, unspecified: Secondary | ICD-10-CM | POA: Diagnosis not present

## 2023-10-16 DIAGNOSIS — M5030 Other cervical disc degeneration, unspecified cervical region: Secondary | ICD-10-CM | POA: Diagnosis not present

## 2023-10-16 DIAGNOSIS — Z7982 Long term (current) use of aspirin: Secondary | ICD-10-CM | POA: Insufficient documentation

## 2023-10-16 DIAGNOSIS — Z79899 Other long term (current) drug therapy: Secondary | ICD-10-CM | POA: Diagnosis not present

## 2023-10-16 DIAGNOSIS — E119 Type 2 diabetes mellitus without complications: Secondary | ICD-10-CM | POA: Diagnosis not present

## 2023-10-16 DIAGNOSIS — M542 Cervicalgia: Secondary | ICD-10-CM | POA: Insufficient documentation

## 2023-10-16 DIAGNOSIS — F0390 Unspecified dementia without behavioral disturbance: Secondary | ICD-10-CM | POA: Insufficient documentation

## 2023-10-16 DIAGNOSIS — I5032 Chronic diastolic (congestive) heart failure: Secondary | ICD-10-CM | POA: Diagnosis not present

## 2023-10-16 DIAGNOSIS — M7989 Other specified soft tissue disorders: Secondary | ICD-10-CM | POA: Diagnosis not present

## 2023-10-16 DIAGNOSIS — E559 Vitamin D deficiency, unspecified: Secondary | ICD-10-CM | POA: Diagnosis not present

## 2023-10-16 DIAGNOSIS — E785 Hyperlipidemia, unspecified: Secondary | ICD-10-CM | POA: Diagnosis not present

## 2023-10-16 DIAGNOSIS — I10 Essential (primary) hypertension: Secondary | ICD-10-CM | POA: Diagnosis not present

## 2023-10-16 DIAGNOSIS — M4313 Spondylolisthesis, cervicothoracic region: Secondary | ICD-10-CM | POA: Diagnosis not present

## 2023-10-16 DIAGNOSIS — M4622 Osteomyelitis of vertebra, cervical region: Secondary | ICD-10-CM | POA: Diagnosis not present

## 2023-10-16 DIAGNOSIS — M869 Osteomyelitis, unspecified: Secondary | ICD-10-CM | POA: Diagnosis not present

## 2023-10-16 LAB — CBC WITH DIFFERENTIAL/PLATELET
Abs Immature Granulocytes: 0.03 10*3/uL (ref 0.00–0.07)
Basophils Absolute: 0 10*3/uL (ref 0.0–0.1)
Basophils Relative: 1 %
Eosinophils Absolute: 0.2 10*3/uL (ref 0.0–0.5)
Eosinophils Relative: 3 %
HCT: 39 % (ref 36.0–46.0)
Hemoglobin: 11.6 g/dL — ABNORMAL LOW (ref 12.0–15.0)
Immature Granulocytes: 0 %
Lymphocytes Relative: 28 %
Lymphs Abs: 2 10*3/uL (ref 0.7–4.0)
MCH: 26.8 pg (ref 26.0–34.0)
MCHC: 29.7 g/dL — ABNORMAL LOW (ref 30.0–36.0)
MCV: 90.1 fL (ref 80.0–100.0)
Monocytes Absolute: 0.6 10*3/uL (ref 0.1–1.0)
Monocytes Relative: 8 %
Neutro Abs: 4.4 10*3/uL (ref 1.7–7.7)
Neutrophils Relative %: 60 %
Platelets: 238 10*3/uL (ref 150–400)
RBC: 4.33 MIL/uL (ref 3.87–5.11)
RDW: 17.7 % — ABNORMAL HIGH (ref 11.5–15.5)
WBC: 7.2 10*3/uL (ref 4.0–10.5)
nRBC: 0 % (ref 0.0–0.2)

## 2023-10-16 LAB — BASIC METABOLIC PANEL WITH GFR
Anion gap: 8 (ref 5–15)
BUN: 21 mg/dL (ref 8–23)
CO2: 27 mmol/L (ref 22–32)
Calcium: 8.2 mg/dL — ABNORMAL LOW (ref 8.9–10.3)
Chloride: 109 mmol/L (ref 98–111)
Creatinine, Ser: 0.93 mg/dL (ref 0.44–1.00)
GFR, Estimated: >60 mL/min
Glucose, Bld: 175 mg/dL — ABNORMAL HIGH (ref 70–99)
Potassium: 4.3 mmol/L (ref 3.5–5.1)
Sodium: 144 mmol/L (ref 135–145)

## 2023-10-16 MED ORDER — GADOBUTROL 1 MMOL/ML IV SOLN
7.0000 mL | Freq: Once | INTRAVENOUS | Status: AC | PRN
  Administered 2023-10-16: 7 mL via INTRAVENOUS

## 2023-10-16 MED ORDER — ACETAMINOPHEN 325 MG PO TABS
650.0000 mg | ORAL_TABLET | Freq: Once | ORAL | Status: AC
  Administered 2023-10-16: 650 mg via ORAL
  Filled 2023-10-16: qty 2

## 2023-10-16 NOTE — Progress Notes (Signed)
Patient ID: Katelyn Ballard, female   DOB: 05-18-1945, 78 y.o.   MRN: [id number] BP (!) 155/95   Pulse 66   Temp 98.6 F (37 C)   Resp 18   SpO2 98%  BP (!) 155/95   Pulse 66   Temp 98.6 F (37 C)   Resp 18   SpO2 98%  Films, mri reviewed. There is no indication for operative intervention.

## 2023-10-16 NOTE — ED Provider Notes (Signed)
[place] EMERGENCY DEPARTMENT AT [facility] Provider Note   CSN: [id number] Arrival date & time: 10/16/23  1246     History  Chief Complaint  Patient presents with   Neck Pain    Katelyn Ballard is a 78 y.o. female.   Neck Pain      Katelyn Ballard is a 78 y.o. female with past medical history of dementia, currently resides at [facility] SNF sent to the Emergency Department for recheck of neck pain and possible repeat MRI of the cervical spine.  Patient was seen here and evaluated on 09/23/2023 after a fall.  She was admitted to the hospital after a fall found to have possible osteomyelitis/discitis of the cervical spine.  Infectious disease was consulted at that time patient was afebrile and without leukocytosis during hospital stay, no antibiotics were given.  Biopsy was considered but given likelihood of low yield for culture and felt that risk outweigh benefit so no biopsy was performed.  Today patient woke complaining of neck pain and she was evaluated by provider at facility and recommended to be sent to the ER for further evaluation and repeat imaging which was recommended in 4 weeks after hospital discharge  Patient complaining of pain to her neck but otherwise unable to provide any significant or beneficial medical history.  Home Medications Prior to Admission medications   Medication Sig Start Date End Date Taking? Authorizing Provider  allopurinol (ZYLOPRIM) 300 MG tablet Take 1 tablet (300 mg total) by mouth daily. Patient taking differently: Take 900 mg by mouth daily. 08/24/21   Bennie Pierini, FNP  aspirin 81 MG chewable tablet Chew 81 mg by mouth in the morning.    [provider]  blood glucose meter kit and supplies Check blood sugar bid and as needed Dx: E11.42 03/11/20   Bennie Pierini, FNP  blood glucose meter kit and supplies Dispense based on patient and insurance preference. Use up to four times daily as directed. (FOR  ICD-10 E10.9, E11.9). 04/18/21   Daphine Deutscher Mary-Margaret, FNP  cetirizine (ZYRTEC) 10 MG tablet Take 10 mg by mouth daily.    [provider]  Cholecalciferol 50 MCG (2000 UT) CAPS Take 2,000 Units by mouth daily.    [provider]  clonazePAM (KLONOPIN) 0.5 MG tablet Take 1 tablet (0.5 mg total) by mouth in the morning, at noon, and at bedtime. 09/26/23   Leatha Gilding, MD  clopidogrel (PLAVIX) 75 MG tablet Take 75 mg by mouth in the morning.    [provider]  divalproex (DEPAKOTE SPRINKLE) 125 MG capsule Take 375 mg by mouth 3 (three) times daily. 07/24/23   [provider]  escitalopram (LEXAPRO) 10 MG tablet Take 10 mg by mouth daily. 07/24/23   [provider]  furosemide (LASIX) 20 MG tablet Take 20 mg by mouth daily. 07/24/23   [provider]  glucose blood (ONETOUCH ULTRA) test strip USE 1 STRIP TO CHECK GLUCOSE 2 TO 3 TIMES DAILY 03/30/21   Daphine Deutscher, Mary-Margaret, FNP  loperamide (IMODIUM) 2 MG capsule Take 4 mg by mouth as needed for diarrhea or loose stools. Do not exceed 4 caps in 24 hours.    [provider]  methocarbamol (ROBAXIN) 500 MG tablet Take 500 mg by mouth every evening. 07/24/23   [provider]  metoprolol tartrate (LOPRESSOR) 25 MG tablet Take 12.5 mg by mouth 2 (two) times daily. 01/09/22   [provider]  mirtazapine (REMERON) 15 MG tablet Take 15  mg by mouth in the morning and at bedtime. 07/24/23   [provider]  oxybutynin (DITROPAN) 5 MG tablet Take 5 mg by mouth at bedtime. 02/13/22   [provider]  rosuvastatin (CRESTOR) 40 MG tablet Take 40 mg by mouth every evening. 07/24/23   [provider]  sennosides-docusate sodium (SENOKOT-S) 8.6-50 MG tablet Take 2 tablets by mouth in the morning.    [provider]  TRULICITY 0.75 MG/0.5ML SOPN Inject 0.75 mg into the skin once a week. Saturdays. 08/31/23   [provider]      Allergies     Atorvastatin, Shellfish allergy, and Penicillins    Review of Systems   Review of Systems  Unable to perform ROS: Dementia  Musculoskeletal:  Positive for neck pain.    Physical Exam Updated Vital Signs BP 138/76   Pulse 63   Temp 98.1 F (36.7 C) (Oral)   Resp 18   SpO2 96%  Physical Exam Vitals and nursing note reviewed.  Constitutional:      General: She is not in acute distress.    Appearance: Normal appearance. She is not ill-appearing.  Eyes:     Extraocular Movements: Extraocular movements intact.     Conjunctiva/sclera: Conjunctivae normal.     Pupils: Pupils are equal, round, and reactive to light.  Neck:     Meningeal: Kernig's sign absent.     Comments: Tender to palpation uppermost cervical spine.  No bony step-offs or edema noted.  She also has tenderness along the right cervical paraspinal area. Cardiovascular:     Rate and Rhythm: Normal rate and regular rhythm.     Pulses: Normal pulses.  Pulmonary:     Effort: Pulmonary effort is normal.  Abdominal:     Palpations: Abdomen is soft.     Tenderness: There is no abdominal tenderness.  Musculoskeletal:     Cervical back: Tenderness present. Spinous process tenderness and muscular tenderness present.  Skin:    General: Skin is warm.     Capillary Refill: Capillary refill takes less than 2 seconds.  Neurological:     Mental Status: She is alert.     Sensory: No sensory deficit.     Comments: Patient alert, dementia at baseline.  Follows commands well.  Speech clear     ED Results / Procedures / Treatments   Labs (all labs ordered are listed, but only abnormal results are displayed) Labs Reviewed  CBC WITH DIFFERENTIAL/PLATELET - Abnormal; Notable for the following components:      Result Value   Hemoglobin 11.6 (*)    MCHC 29.7 (*)    RDW 17.7 (*)    All other components within normal limits  BASIC METABOLIC PANEL - Abnormal; Notable for the following components:   Glucose, Bld 175 (*)     Calcium 8.2 (*)    All other components within normal limits    EKG None  Radiology MR Cervical Spine W and Wo Contrast  Result Date: 10/16/2023 CLINICAL DATA:  Cervical osteomyelitis. EXAM: MRI CERVICAL SPINE WITHOUT AND WITH CONTRAST TECHNIQUE: Multiplanar and multiecho pulse sequences of the cervical spine, to include the craniocervical junction and cervicothoracic junction, were obtained without and with intravenous contrast. CONTRAST:  7mL GADAVIST GADOBUTROL 1 MMOL/ML IV SOLN COMPARISON:  Cervical spine MRI 09/23/2023 FINDINGS: Multiple sequences are moderately to severely motion degraded. Alignment: Trace anterolisthesis of C7 on T1 and T1 on T2. Vertebrae: Persistent marrow edema and enhancement throughout the C7 and T1 vertebral bodies with  associated endplate erosions, overall similar to the prior MRI. Associated small volume T2 hyperintense, enhancing material in the primarily ventral epidural space measuring up to 3 mm in AP thickness. Unchanged edema and enhancement in the C3 vertebral body and along the C4 superior and C2 inferior endplates without definite endplate erosion or prevertebral inflammation and therefore favored to be degenerative rather than infectious. Cord: No cord signal abnormality identified within limitations of motion artifact. Posterior Fossa, vertebral arteries, paraspinal tissues: Persistent mild prevertebral soft tissue edema and enhancement in the lower cervical spine. No paraspinal abscess. Preserved vertebral artery flow voids. Disc levels: Grossly unchanged multilevel disc and facet degeneration. Please see the recent prior report for a detailed level by level description. IMPRESSION: 1. Motion degraded examination. 2. Similar appearance of C7-T1 discitis/osteomyelitis with small volume enhancing epidural material/phlegmon. No evidence of a drainable abscess. Electronically Signed   By: Sebastian Ache M.D.   On: 10/16/2023 18:58    Procedures Procedures     Medications Ordered in ED Medications - No data to display  ED Course/ Medical Decision Making/ A&P                                 Medical Decision Making Patient seen here late September for fall and neck pain.  Found to have discitis/osteomyelitis on cervical MRI.  Patient was admitted to the hospital and infectious disease was consulted.  It was felt that MRI findings represent a chronic issue rather than actual infectious process, no antibiotics were recommended at time of admission.  There was no evidence of epidural abscess.  IR was consulted and given the fluid of low yield for culture risk of procedure outweighs potential benefits, so biopsy was held.  She was recommended to have repeat imaging of the cervical spine in 1 month       Amount and/or Complexity of Data Reviewed Labs: ordered.    Details: Labs interpreted by me, no evidence of leukocytosis, chemistries without derangement. Radiology: ordered.    Details: MRI of the cervical spine today shows motion degraded exam similar appearance of C7-T1 discitis/osteomyelitis with small volume enhancing epidural material/phlegmon no drainable abscess Discussion of management or test interpretation with external provider(s): Patient alert here.  Remains afebrile.  No leukocytosis.  Well appearing, vital signs reassuring.  Neck pain improved after Tylenol  Discussed MRI findings with infectious disease, Dr.Manandhar who recommends consultation with neurosurgery.  Does not recommend antibiotics at this time.  Neurosurgery was consulted, Dr. Franky Macho discussed MRI findings.  Nothing to do from neurosurgery standpoint.  Patient felt appropriate for return back to facility.  Tylenol for pain.  Risk Prescription drug management.           Final Clinical Impression(s) / ED Diagnoses Final diagnoses:  Neck pain    Rx / DC Orders ED Discharge Orders     None         Rosey Bath 10/16/23 2038     Bethann Berkshire, MD 10/17/23 [phone]

## 2023-10-16 NOTE — Discharge Instructions (Signed)
Tylenol every 4 hours as needed for pain.  Follow-up with your primary care provider for recheck.

## 2023-10-16 NOTE — ED Notes (Signed)
Notified [facility] of patient needing transportation back to [facility].

## 2023-10-16 NOTE — ED Triage Notes (Addendum)
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

## 2023-10-17 DIAGNOSIS — R293 Abnormal posture: Secondary | ICD-10-CM | POA: Diagnosis not present

## 2023-10-17 DIAGNOSIS — M5090 Cervical disc disorder, unspecified, unspecified cervical region: Secondary | ICD-10-CM | POA: Diagnosis not present

## 2023-10-17 DIAGNOSIS — G309 Alzheimer's disease, unspecified: Secondary | ICD-10-CM | POA: Diagnosis not present

## 2023-10-17 DIAGNOSIS — Z79899 Other long term (current) drug therapy: Secondary | ICD-10-CM | POA: Diagnosis not present

## 2023-10-17 DIAGNOSIS — R52 Pain, unspecified: Secondary | ICD-10-CM | POA: Diagnosis not present

## 2023-10-17 NOTE — ED Notes (Signed)
Whole bed change and brief, Pt cleaned up and dried. Repositioned and resting in the bed with warm blankets and pillows.

## 2023-10-21 DIAGNOSIS — G309 Alzheimer's disease, unspecified: Secondary | ICD-10-CM | POA: Diagnosis not present

## 2023-10-21 DIAGNOSIS — R293 Abnormal posture: Secondary | ICD-10-CM | POA: Diagnosis not present

## 2023-10-22 DIAGNOSIS — Z79899 Other long term (current) drug therapy: Secondary | ICD-10-CM | POA: Diagnosis not present

## 2023-10-22 DIAGNOSIS — R293 Abnormal posture: Secondary | ICD-10-CM | POA: Diagnosis not present

## 2023-10-22 DIAGNOSIS — G309 Alzheimer's disease, unspecified: Secondary | ICD-10-CM | POA: Diagnosis not present

## 2023-10-23 ENCOUNTER — Telehealth: Payer: Self-pay

## 2023-10-23 ENCOUNTER — Other Ambulatory Visit: Payer: Self-pay

## 2023-10-23 ENCOUNTER — Telehealth: Payer: Medicare Other | Admitting: Internal Medicine

## 2023-10-23 DIAGNOSIS — M4622 Osteomyelitis of vertebra, cervical region: Secondary | ICD-10-CM

## 2023-10-23 NOTE — Progress Notes (Signed)
RFV: presumed cervical spine osteomyelitis  Patient ID: Katelyn Katelyn, female   DOB: 09-20-1945, 78 y.o.   MRN: [id number]  Virtual Visit via Telephone Note  I connected with Katelyn Katelyn on 10/23/23 at  9:00 AM EDT by telephone and verified that I am speaking with the correct person using two identifiers.  Location: Patient:  at SNF Provider: in Clinic   I discussed the limitations, risks, security and privacy concerns of performing an evaluation and management service by telephone and the availability of in person appointments. I also discussed with the patient that there may be a patient responsible charge related to this service. The patient expressed understanding and agreed to proceed.  HPI  78yo F with history of dementia,hx of CVA with broca's aphasia, CAD, T2DM, HTN, HLD, obesity  Originally seen on consult service at end of September where she was admitted for fall and imaging of spine found abnormality to cervical spine, which was not convinced that she had cervical spine osteo since prior imaging was similar and this could be possible changes due to DJD.  She was discharged back to [facility] in the following weeks, she was sent back to the ED for evaluation due to neck pain. Roughly 3 wks from prior admission. She was seen on 10/16/23 repeat imaging of cervical spine showed Similar appearance of C7-T1 discitis/osteomyelitis with small volume enhancing epidural material/phlegmon. No evidence of a drainable abscess. She was not admitted at that time, just given pain medication and referred back to SNF. NSGY did informal consult with ED provider and felt nothing to do from surgery standpoint.   On review of this information, concern that she has small volume enhancing epidural material which we would not necessarily anticipate wit DJD disease.   Nurse, Leah,reports patient having pain at neck but not noticing any weakness to arms. Patient has significant dementia,  difficult to give history  I have reviewed recent labs and notes and repeat imaging for ED visit on 10/16/2023.  Outpatient Encounter Medications as of 10/23/2023  Medication Sig   allopurinol (ZYLOPRIM) 300 MG tablet Take 1 tablet (300 mg total) by mouth daily.   aspirin 81 MG chewable tablet Chew 81 mg by mouth in the morning.   blood glucose meter kit and supplies Check blood sugar bid and as needed Dx: E11.42   blood glucose meter kit and supplies Dispense based on patient and insurance preference. Use up to four times daily as directed. (FOR ICD-10 E10.9, E11.9).   cefTRIAXone (ROCEPHIN) 1 g injection Inject 1 g into the muscle once.   cetirizine (ZYRTEC) 10 MG tablet Take 10 mg by mouth daily.   Cholecalciferol 50 MCG (2000 UT) CAPS Take 2,000 Units by mouth daily.   clonazePAM (KLONOPIN) 0.5 MG tablet Take 1 tablet (0.5 mg total) by mouth in the morning, at noon, and at bedtime.   clopidogrel (PLAVIX) 75 MG tablet Take 75 mg by mouth in the morning.   divalproex (DEPAKOTE SPRINKLE) 125 MG capsule Take 375 mg by mouth 3 (three) times daily.   escitalopram (LEXAPRO) 10 MG tablet Take 10 mg by mouth daily.   furosemide (LASIX) 20 MG tablet Take 20 mg by mouth daily.   glucose blood (ONETOUCH ULTRA) test strip USE 1 STRIP TO CHECK GLUCOSE 2 TO 3 TIMES DAILY   melatonin 3 MG TABS tablet Take 3 mg by mouth at bedtime.   methocarbamol (ROBAXIN) 500 MG tablet Take 500 mg by mouth every evening.   metoprolol  tartrate (LOPRESSOR) 25 MG tablet Take 12.5 mg by mouth 2 (two) times daily.   mirtazapine (REMERON) 15 MG tablet Take 15 mg by mouth in the morning and at bedtime.   nitrofurantoin, macrocrystal-monohydrate, (MACROBID) 100 MG capsule Take 100 mg by mouth 2 (two) times daily.   oxybutynin (DITROPAN) 5 MG tablet Take 5 mg by mouth at bedtime.   rosuvastatin (CRESTOR) 40 MG tablet Take 40 mg by mouth every evening.   sennosides-docusate sodium (SENOKOT-S) 8.6-50 MG tablet Take 2 tablets by  mouth in the morning.   TRULICITY 0.75 MG/0.5ML SOPN Inject 0.75 mg into the skin once a week. Saturdays.   No facility-administered encounter medications on file as of 10/23/2023.     Patient Active Problem List   Diagnosis Date Noted   Multilevel degenerative disc disease 09/25/2023   Osteomyelitis (HCC) 09/23/2023   Hypokalemia 09/23/2023   Sundowning 09/23/2023   Acute cerebrovascular accident (CVA) due to occlusion of left posterior cerebral artery (HCC) 02/05/2022   Broca's aphasia 02/05/2022   TIA (transient ischemic attack) 02/05/2022   Acute diastolic heart failure (HCC) 02/04/2022   Acute encephalopathy 02/04/2022   History of COVID-19 02/04/2022   History of non-ST elevation myocardial infarction (NSTEMI) 02/04/2022   Occipital stroke (HCC) 02/04/2022   Anemia 01/03/2022   History of pulmonary embolism 01/03/2022   Former smoker 02/04/2021   Type 2 diabetes mellitus without complication, without long-term current use of insulin (HCC) 02/04/2021   Trigger finger, left middle finger 02/26/2019   BMI 33.0-33.9,adult 10/04/2015   Vitamin D deficiency 04/08/2015   Diabetes (HCC) 05/03/2014   Hypertension 10/22/2013   Hyperlipidemia with target LDL less than 100 10/22/2013   Gout 10/22/2013     Health Maintenance Due  Topic Date Due   COVID-19 Vaccine (1) Never done   DTaP/Tdap/Td (1 - Tdap) Never done   Zoster Vaccines- Shingrix (1 of 2) Never done   DEXA SCAN  03/22/2017   OPHTHALMOLOGY EXAM  10/04/2020   Medicare Annual Wellness (AWV)  08/29/2021   Diabetic kidney evaluation - Urine ACR  04/18/2022   FOOT EXAM  04/18/2022   INFLUENZA VACCINE  08/01/2023     Review of Systems Unable to obtain due to aphasia Physical Exam   Did not examine since telephone visit CBC Lab Results  Component Value Date   WBC 7.2 10/16/2023   RBC 4.33 10/16/2023   HGB 11.6 (L) 10/16/2023   HCT 39.0 10/16/2023   PLT 238 10/16/2023   MCV 90.1 10/16/2023   MCH 26.8  10/16/2023   MCHC 29.7 (L) 10/16/2023   RDW 17.7 (H) 10/16/2023   LYMPHSABS 2.0 10/16/2023   MONOABS 0.6 10/16/2023   EOSABS 0.2 10/16/2023    BMET Lab Results  Component Value Date   NA 144 10/16/2023   K 4.3 10/16/2023   CL 109 10/16/2023   CO2 27 10/16/2023   GLUCOSE 175 (H) 10/16/2023   BUN 21 10/16/2023   CREATININE 0.93 10/16/2023   CALCIUM 8.2 (L) 10/16/2023   GFRNONAA >60 10/16/2023   GFRAA 60 12/05/2020      Assessment and Plan 78yo F with multiple co-morbidities with concern for cervical spine osteomyelitis. Plan to empirically treat for spinal osteo due to small changes noted on repeat mri of cervical spine.  Please have picc line placed for IV abtx for 6 weeks Please get baseline sed rate and crp  and CK( has cbc and bmp drawn < 7 days) Plan to treat with daptomycin 8mg /kg/day with weekly  monitoring of bmp, cbc, and CK PLUS Ceftriaxone 2gm Iv daily.  We will plan to see patient back in person in 4-6 wk, near conclusion of iv abtx and treatment course Also plan to repeat cervical spine imaging in 6 wk at end of therapy.  Orders communicated to Tacey Ruiz, patient's nurse.  Abtx orders listed below:  --------------------------------------------------- Diagnosis: Cervical spine osteomyelitis  Culture Result: cultures not obtained  Allergies  Allergen Reactions   Atorvastatin Other (See Comments)    myalgias   Shellfish Allergy     Other reaction(s): Unknown   Penicillins     OPAT Orders Discharge antibiotics to be given via PICC line Discharge antibiotics: Per pharmacy protocol :daptomycin 8mg /kg/day plus ceftriaxone 2gm IV daily  Duration: 6 wk End Date: Dec 7th 2024  [facility] Care Per Protocol:  Home health RN for IV administration and teaching; PICC line care and labs.    Labs weekly while on IV antibiotics: _x_ CBC with differential _x_ BMP __x CRP _x_ ESR _x_ CK  _x_ Please pull PIC at completion of IV antibiotics  Fax weekly labs to  [phone]  Clinic Follow Up Appt: In person in 4-6 wk  @ Dr Drue Second  ----------------- This note faxed to SNF at [phone]  I have personally spent 40 minutes involved in non-face-to-face activities for this patient on the day of the visit. Professional time spent includes the following activities: Preparing to see the patient (review of tests), Obtaining and/or reviewing separately obtained history (admission/discharge record), Performing a medically appropriate examination and/or evaluation , Ordering medications/tests/procedures, referring and communicating with other health care professionals, Documenting clinical information in the EMR, Independently interpreting results (not separately reported), Communicating results to the patient/family/caregiver, Counseling and educating the patient/family/caregiver and Care coordination (not separately reported).

## 2023-10-23 NOTE — Telephone Encounter (Signed)
Faxed note to [facility] to nurse Tacey Ruiz and received confirmation. Called and left a voice message for Tacey Ruiz to see if they have picc placement setup or if we need to get it setup.

## 2023-10-24 ENCOUNTER — Telehealth: Payer: Self-pay

## 2023-10-24 ENCOUNTER — Telehealth: Payer: Self-pay | Admitting: Internal Medicine

## 2023-10-24 DIAGNOSIS — M4622 Osteomyelitis of vertebra, cervical region: Secondary | ICD-10-CM | POA: Diagnosis not present

## 2023-10-24 DIAGNOSIS — E860 Dehydration: Secondary | ICD-10-CM | POA: Diagnosis not present

## 2023-10-24 DIAGNOSIS — R293 Abnormal posture: Secondary | ICD-10-CM | POA: Diagnosis not present

## 2023-10-24 DIAGNOSIS — M5090 Cervical disc disorder, unspecified, unspecified cervical region: Secondary | ICD-10-CM | POA: Diagnosis not present

## 2023-10-24 DIAGNOSIS — N390 Urinary tract infection, site not specified: Secondary | ICD-10-CM | POA: Diagnosis not present

## 2023-10-24 DIAGNOSIS — Z79899 Other long term (current) drug therapy: Secondary | ICD-10-CM | POA: Diagnosis not present

## 2023-10-24 DIAGNOSIS — G309 Alzheimer's disease, unspecified: Secondary | ICD-10-CM | POA: Diagnosis not present

## 2023-10-24 NOTE — Telephone Encounter (Signed)
Patient scheduled for picc line on Monday 10/28/23 at

## 2023-10-24 NOTE — Addendum Note (Signed)
Addended by: [name] on: 10/24/2023 01:37 PM   Modules accepted: Orders

## 2023-10-24 NOTE — Telephone Encounter (Signed)
Called [facility] regarding message left by [facility] CMA. Spoke with Angie, LPN who confirms picc lien was placed yesterday.  [name], RMA

## 2023-10-24 NOTE — Telephone Encounter (Signed)
Received follow up call from Nurse who states patient does not have picc line. Has IV placed to antibiotic. Explained to nurse that regular IV is not sufficient for long term IV antibiotics. Will need picc line placed.  Will forward message to Dr. Drue Second for order and will contact IR to schedule appt.  [name], RMA

## 2023-10-24 NOTE — Telephone Encounter (Signed)
Spoke with Belinda at [facility] and relayed PICC placement appointment time and location.   Sandie Ano, RN

## 2023-10-24 NOTE — Telephone Encounter (Signed)
Received call today from Katelyn Ballard with [facility] regarding appointment for Cerival MRI. Patient currently scheduled for appointment 11/01/23. Is  asking if they can push MRI appointment back.  Per MD note would like to repeat imaging in six weeks at the end of Iv therapy.  Verbalized understanding and will contact radiology to reschedule appointment.  [name], RMA

## 2023-10-28 ENCOUNTER — Other Ambulatory Visit: Payer: Self-pay

## 2023-10-28 ENCOUNTER — Ambulatory Visit (HOSPITAL_COMMUNITY): Admission: RE | Admit: 2023-10-28 | Payer: Medicare Other | Source: Ambulatory Visit

## 2023-10-28 ENCOUNTER — Telehealth: Payer: Self-pay

## 2023-10-28 DIAGNOSIS — I340 Nonrheumatic mitral (valve) insufficiency: Secondary | ICD-10-CM | POA: Diagnosis not present

## 2023-10-28 NOTE — Telephone Encounter (Signed)
Sounds good - just to clarify - dalbavancin correct?

## 2023-10-28 NOTE — Telephone Encounter (Addendum)
Per Dr. Drue Second can d/c IV antibiotics and picc line and try to arrange patient to have infusion of Dalbavancin 1500 mg q14 days for four doses. Will reach out to [facility] at infusion clinic to help arrange this for patient. Will need to schedule appointments with Tacey Ruiz at [facility]. P: [phone] [name], RMA

## 2023-10-28 NOTE — Telephone Encounter (Signed)
Yes sorry

## 2023-10-28 NOTE — Telephone Encounter (Signed)
Received call from [place], RN with [facility] stating patient has pulled picc line out multiple times. Picc originally placed on the 25, but patient pulled it on the 26th. Picc replaced same day and picc was pulled again on the 27th. Provider at SNF has placed IV antbx on hold until they hear back from Dr. Drue Second on how to proceed.  Secure chat forwarded to provider. Will follow up with RN once providers replies.  P: [phone] [name], RMA

## 2023-10-31 ENCOUNTER — Encounter: Payer: Medicare Other | Attending: Internal Medicine | Admitting: Internal Medicine

## 2023-10-31 VITALS — BP 143/93 | HR 62 | Temp 97.9°F | Resp 18

## 2023-10-31 DIAGNOSIS — M4622 Osteomyelitis of vertebra, cervical region: Secondary | ICD-10-CM | POA: Diagnosis not present

## 2023-10-31 DIAGNOSIS — M869 Osteomyelitis, unspecified: Secondary | ICD-10-CM

## 2023-10-31 DIAGNOSIS — Z79899 Other long term (current) drug therapy: Secondary | ICD-10-CM | POA: Diagnosis not present

## 2023-10-31 DIAGNOSIS — R52 Pain, unspecified: Secondary | ICD-10-CM | POA: Diagnosis not present

## 2023-10-31 MED ORDER — DALBAVANCIN HCL 500 MG IV SOLR
1500.0000 mg | Freq: Once | INTRAVENOUS | Status: AC
  Administered 2023-10-31: 1500 mg via INTRAVENOUS
  Filled 2023-10-31: qty 75

## 2023-10-31 NOTE — Progress Notes (Signed)
Diagnosis: Osteomyelitis  Provider:   Judyann Munson, MD  Procedure: IV Infusion  IV Type: Peripheral, IV Location: L Antecubital  Dalvance (Dalbavancin), Dose: 1500mg   Infusion Start Time: 1311  Infusion Stop Time: 1349  Post Infusion IV Care: Observation period completed  Discharge: Condition: Good, Destination: Nursing Home . AVS Provided  Performed by:  Feliberto Harts, LPN

## 2023-11-01 ENCOUNTER — Ambulatory Visit (HOSPITAL_COMMUNITY): Payer: Medicare Other

## 2023-11-07 DIAGNOSIS — Z79899 Other long term (current) drug therapy: Secondary | ICD-10-CM | POA: Diagnosis not present

## 2023-11-07 DIAGNOSIS — M4622 Osteomyelitis of vertebra, cervical region: Secondary | ICD-10-CM | POA: Diagnosis not present

## 2023-11-07 DIAGNOSIS — R52 Pain, unspecified: Secondary | ICD-10-CM | POA: Diagnosis not present

## 2023-11-14 ENCOUNTER — Encounter: Payer: Medicare Other | Attending: Internal Medicine | Admitting: Emergency Medicine

## 2023-11-14 VITALS — BP 118/62 | HR 69 | Temp 97.8°F | Resp 18

## 2023-11-14 DIAGNOSIS — M869 Osteomyelitis, unspecified: Secondary | ICD-10-CM

## 2023-11-14 MED ORDER — DEXTROSE 5 % IV SOLN
1500.0000 mg | Freq: Once | INTRAVENOUS | Status: AC
  Administered 2023-11-14: 1500 mg via INTRAVENOUS
  Filled 2023-11-14: qty 75

## 2023-11-14 NOTE — Progress Notes (Signed)
Diagnosis:  Osteomyelitis   Provider:   Judyann Munson, MD   Procedure: IV Infusion  IV Type: Peripheral, IV Location: L Antecubital  Dalvance (Dalbavancin), Dose: 1500 mg  Infusion Start Time: 1317  Infusion Stop Time: 1400  Post Infusion IV Care: Peripheral IV Discontinued  Discharge: Condition: Good, Destination: Home . AVS Provided  Performed by:  Arrie Senate, RN

## 2023-11-19 ENCOUNTER — Ambulatory Visit: Payer: Medicare Other | Admitting: Internal Medicine

## 2023-11-19 ENCOUNTER — Encounter: Payer: Self-pay | Admitting: Internal Medicine

## 2023-11-19 ENCOUNTER — Other Ambulatory Visit: Payer: Self-pay

## 2023-11-19 VITALS — BP 114/66 | HR 55 | Temp 97.5°F

## 2023-11-19 DIAGNOSIS — G061 Intraspinal abscess and granuloma: Secondary | ICD-10-CM | POA: Diagnosis not present

## 2023-11-19 NOTE — Progress Notes (Signed)
RFV: follow cervical spinal epidural abscess Patient ID: MARGUERIETE BOOTEN, female   DOB: Oct 20, 1945, 78 y.o.   MRN: [id number]  HPI "[name]" is a 78yo F with htn, cad, dementia, who was found to have cervical epidural abscess that was med management. Currently receiving dalbavancin infusion. She had failed keeping picc line in place. (Pulled out 2). Decision made to convert over to dalbavancin infusion Q2 wkx 4 doses. Has received 2 thus far without issues  Here with daughter.. neck pain improved. -good range motion Outpatient Encounter Medications as of 11/19/2023  Medication Sig   allopurinol (ZYLOPRIM) 300 MG tablet Take 1 tablet (300 mg total) by mouth daily.   aspirin 81 MG chewable tablet Chew 81 mg by mouth in the morning.   blood glucose meter kit and supplies Check blood sugar bid and as needed Dx: E11.42   blood glucose meter kit and supplies Dispense based on patient and insurance preference. Use up to four times daily as directed. (FOR ICD-10 E10.9, E11.9).   cefTRIAXone (ROCEPHIN) 1 g injection Inject 1 g into the muscle once.   cetirizine (ZYRTEC) 10 MG tablet Take 10 mg by mouth daily.   Cholecalciferol 50 MCG (2000 UT) CAPS Take 2,000 Units by mouth daily.   clonazePAM (KLONOPIN) 0.5 MG tablet Take 1 tablet (0.5 mg total) by mouth in the morning, at noon, and at bedtime.   clopidogrel (PLAVIX) 75 MG tablet Take 75 mg by mouth in the morning.   divalproex (DEPAKOTE SPRINKLE) 125 MG capsule Take 375 mg by mouth 3 (three) times daily.   escitalopram (LEXAPRO) 10 MG tablet Take 10 mg by mouth daily.   furosemide (LASIX) 20 MG tablet Take 20 mg by mouth daily.   glucose blood (ONETOUCH ULTRA) test strip USE 1 STRIP TO CHECK GLUCOSE 2 TO 3 TIMES DAILY   melatonin 3 MG TABS tablet Take 3 mg by mouth at bedtime.   methocarbamol (ROBAXIN) 500 MG tablet Take 500 mg by mouth every evening.   metoprolol tartrate (LOPRESSOR) 25 MG tablet Take 12.5 mg by mouth 2 (two) times daily.    mirtazapine (REMERON) 15 MG tablet Take 15 mg by mouth in the morning and at bedtime.   nitrofurantoin, macrocrystal-monohydrate, (MACROBID) 100 MG capsule Take 100 mg by mouth 2 (two) times daily.   oxybutynin (DITROPAN) 5 MG tablet Take 5 mg by mouth at bedtime.   rosuvastatin (CRESTOR) 40 MG tablet Take 40 mg by mouth every evening.   sennosides-docusate sodium (SENOKOT-S) 8.6-50 MG tablet Take 2 tablets by mouth in the morning.   TRULICITY 0.75 MG/0.5ML SOPN Inject 0.75 mg into the skin once a week. Saturdays.   No facility-administered encounter medications on file as of 11/19/2023.     Patient Active Problem List   Diagnosis Date Noted   Multilevel degenerative disc disease 09/25/2023   Osteomyelitis (HCC) 09/23/2023   Hypokalemia 09/23/2023   Sundowning 09/23/2023   Acute cerebrovascular accident (CVA) due to occlusion of left posterior cerebral artery (HCC) 02/05/2022   Broca's aphasia 02/05/2022   TIA (transient ischemic attack) 02/05/2022   Acute diastolic heart failure (HCC) 02/04/2022   Acute encephalopathy 02/04/2022   History of COVID-19 02/04/2022   History of non-ST elevation myocardial infarction (NSTEMI) 02/04/2022   Occipital stroke (HCC) 02/04/2022   Anemia 01/03/2022   History of pulmonary embolism 01/03/2022   Former smoker 02/04/2021   Type 2 diabetes mellitus without complication, without long-term current use of insulin (HCC) 02/04/2021   Trigger finger, left  middle finger 02/26/2019   BMI 33.0-33.9,adult 10/04/2015   Vitamin D deficiency 04/08/2015   Diabetes (HCC) 05/03/2014   Hypertension 10/22/2013   Hyperlipidemia with target LDL less than 100 10/22/2013   Gout 10/22/2013     Health Maintenance Due  Topic Date Due   COVID-19 Vaccine (1) Never done   DTaP/Tdap/Td (1 - Tdap) Never done   Zoster Vaccines- Shingrix (1 of 2) Never done   DEXA SCAN  03/22/2017   OPHTHALMOLOGY EXAM  10/04/2020   Medicare Annual Wellness (AWV)  08/29/2021   Diabetic  kidney evaluation - Urine ACR  04/18/2022   FOOT EXAM  04/18/2022   INFLUENZA VACCINE  08/01/2023     Physical Exam   There were no vitals taken for this visit.   Physical Exam  Constitutional:  oriented to person, only. appears well-developed and well-nourished. No distress.  HENT: [place]/AT, PERRLA, no scleral icterus Mouth/Throat: Oropharynx is clear and moist. No oropharyngeal exudate.  Cardiovascular: Normal rate, regular rhythm and normal heart sounds. Exam reveals no gallop and no friction rub.  No murmur heard.  Pulmonary/Chest: Effort normal and breath sounds normal. No respiratory distress.  has no wheezes.  Neck = supple, no nuchal rigidity Abdominal: Soft. Bowel sounds are normal.  exhibits no distension. There is no tenderness.  Lymphadenopathy: no cervical adenopathy. No axillary adenopathy Neurological: alert and oriented to person,only Skin: Skin is warm and dry. No rash noted. No erythema.  Psychiatric: a normal mood and affect.  behavior is normal.    CBC Lab Results  Component Value Date   WBC 7.2 10/16/2023   RBC 4.33 10/16/2023   HGB 11.6 (L) 10/16/2023   HCT 39.0 10/16/2023   PLT 238 10/16/2023   MCV 90.1 10/16/2023   MCH 26.8 10/16/2023   MCHC 29.7 (L) 10/16/2023   RDW 17.7 (H) 10/16/2023   LYMPHSABS 2.0 10/16/2023   MONOABS 0.6 10/16/2023   EOSABS 0.2 10/16/2023    BMET Lab Results  Component Value Date   NA 144 10/16/2023   K 4.3 10/16/2023   CL 109 10/16/2023   CO2 27 10/16/2023   GLUCOSE 175 (H) 10/16/2023   BUN 21 10/16/2023   CREATININE 0.93 10/16/2023   CALCIUM 8.2 (L) 10/16/2023   GFRNONAA >60 10/16/2023   GFRAA 60 12/05/2020    Lab Results  Component Value Date   ESRSEDRATE 43 (H) 11/19/2023   Lab Results  Component Value Date   CRP 9.7 (H) 11/19/2023     Assessment and Plan  Epidural abscess= - has 2 more dalba doses coming, mri 12/10 --- repeat - will check labs today; still appears elevated. Plan to continue course  with dalbavancin  - meet in 4 wk to decide - if need oral abtx based on repeat imaging and by then would have had the equivalent of 8 wk of iv therapy ( 4 doses of dalbavancin)  I have personally spent 30 minutes involved in face-to-face and non-face-to-face activities for this patient on the day of the visit. Professional time spent includes the following activities: Preparing to see the patient (review of tests), Obtaining and/or reviewing separately obtained history (admission/discharge record), Performing a medically appropriate examination and/or evaluation , Ordering medications/tests/procedures, referring and communicating with other health care professionals, Documenting clinical information in the EMR, Independently interpreting results (not separately reported), Communicating results to the patient/family/caregiver, Counseling and educating the patient/family/caregiver and Care coordination (not separately reported).

## 2023-11-20 LAB — CBC WITH DIFFERENTIAL/PLATELET
Absolute Lymphocytes: 2523 {cells}/uL (ref 850–3900)
Absolute Monocytes: 703 {cells}/uL (ref 200–950)
Basophils Absolute: 52 {cells}/uL (ref 0–200)
Basophils Relative: 0.7 %
Eosinophils Absolute: 237 {cells}/uL (ref 15–500)
Eosinophils Relative: 3.2 %
HCT: 36.2 % (ref 35.0–45.0)
Hemoglobin: 11 g/dL — ABNORMAL LOW (ref 11.7–15.5)
MCH: 25.9 pg — ABNORMAL LOW (ref 27.0–33.0)
MCHC: 30.4 g/dL — ABNORMAL LOW (ref 32.0–36.0)
MCV: 85.4 fL (ref 80.0–100.0)
MPV: 10.7 fL (ref 7.5–12.5)
Monocytes Relative: 9.5 %
Neutro Abs: 3885 {cells}/uL (ref 1500–7800)
Neutrophils Relative %: 52.5 %
Platelets: 177 10*3/uL (ref 140–400)
RBC: 4.24 10*6/uL (ref 3.80–5.10)
RDW: 15.7 % — ABNORMAL HIGH (ref 11.0–15.0)
Total Lymphocyte: 34.1 %
WBC: 7.4 10*3/uL (ref 3.8–10.8)

## 2023-11-20 LAB — BASIC METABOLIC PANEL
BUN/Creatinine Ratio: 24 (calc) — ABNORMAL HIGH (ref 6–22)
BUN: 27 mg/dL — ABNORMAL HIGH (ref 7–25)
CO2: 30 mmol/L (ref 20–32)
Calcium: 8.5 mg/dL — ABNORMAL LOW (ref 8.6–10.4)
Chloride: 108 mmol/L (ref 98–110)
Creat: 1.11 mg/dL — ABNORMAL HIGH (ref 0.60–1.00)
Glucose, Bld: 74 mg/dL (ref 65–99)
Potassium: 3.7 mmol/L (ref 3.5–5.3)
Sodium: 147 mmol/L — ABNORMAL HIGH (ref 135–146)

## 2023-11-20 LAB — C-REACTIVE PROTEIN: CRP: 9.7 mg/L — ABNORMAL HIGH (ref <8.0)

## 2023-11-20 LAB — SEDIMENTATION RATE: Sed Rate: 43 mm/h — ABNORMAL HIGH (ref 0–30)

## 2023-11-26 DIAGNOSIS — Z79899 Other long term (current) drug therapy: Secondary | ICD-10-CM | POA: Diagnosis not present

## 2023-11-27 ENCOUNTER — Encounter: Payer: Medicare Other | Admitting: Emergency Medicine

## 2023-11-27 VITALS — BP 100/87 | HR 59 | Temp 97.8°F | Resp 18

## 2023-11-27 DIAGNOSIS — M869 Osteomyelitis, unspecified: Secondary | ICD-10-CM | POA: Diagnosis not present

## 2023-11-27 MED ORDER — DEXTROSE 5 % IV SOLN
1500.0000 mg | Freq: Once | INTRAVENOUS | Status: AC
  Administered 2023-11-27: 1500 mg via INTRAVENOUS
  Filled 2023-11-27: qty 75

## 2023-11-27 NOTE — Progress Notes (Signed)
Diagnosis: Iron Deficiency Anemia  Provider:   Judyann Munson MD  Procedure: IV Infusion  IV Type: Peripheral, IV Location: R Antecubital  Dalvance (Dalbavancin), Dose: 1500 mg  Infusion Start Time: 1302  Infusion Stop Time: 1332  Post Infusion IV Care: Peripheral IV Discontinued  Discharge: Condition: Good, Destination: Home . AVS Provided  Performed by:  Arrie Senate, RN

## 2023-12-05 DIAGNOSIS — Z79899 Other long term (current) drug therapy: Secondary | ICD-10-CM | POA: Diagnosis not present

## 2023-12-10 ENCOUNTER — Other Ambulatory Visit: Payer: Medicare Other

## 2023-12-10 ENCOUNTER — Ambulatory Visit (HOSPITAL_COMMUNITY)
Admission: RE | Admit: 2023-12-10 | Discharge: 2023-12-10 | Disposition: A | Discharge status: Home / Self Care | Payer: Medicare Other | Source: Ambulatory Visit | Attending: Emergency Medicine | Admitting: Emergency Medicine

## 2023-12-10 DIAGNOSIS — M869 Osteomyelitis, unspecified: Secondary | ICD-10-CM | POA: Diagnosis not present

## 2023-12-10 DIAGNOSIS — M4802 Spinal stenosis, cervical region: Secondary | ICD-10-CM | POA: Diagnosis not present

## 2023-12-10 DIAGNOSIS — M47812 Spondylosis without myelopathy or radiculopathy, cervical region: Secondary | ICD-10-CM | POA: Diagnosis not present

## 2023-12-10 DIAGNOSIS — M4640 Discitis, unspecified, site unspecified: Secondary | ICD-10-CM | POA: Diagnosis not present

## 2023-12-10 MED ORDER — GADOBUTROL 1 MMOL/ML IV SOLN
7.0000 mL | Freq: Once | INTRAVENOUS | Status: AC | PRN
  Administered 2023-12-10: 7 mL via INTRAVENOUS

## 2023-12-11 ENCOUNTER — Encounter: Payer: Medicare Other | Attending: Internal Medicine | Admitting: Emergency Medicine

## 2023-12-11 VITALS — BP 123/71 | HR 75 | Temp 97.7°F | Resp 18

## 2023-12-11 DIAGNOSIS — M869 Osteomyelitis, unspecified: Secondary | ICD-10-CM | POA: Diagnosis not present

## 2023-12-11 MED ORDER — DEXTROSE 5 % IV SOLN
1500.0000 mg | Freq: Once | INTRAVENOUS | Status: AC
  Administered 2023-12-11: 1500 mg via INTRAVENOUS
  Filled 2023-12-11: qty 75

## 2023-12-11 NOTE — Progress Notes (Signed)
Diagnosis: Osteomyelitis  Provider:  Judyann Munson MD  Procedure: IV Infusion  IV Type: Peripheral, IV Location: R Antecubital  Dalvance (Dalbavancin), Dose: 1500 mg  Infusion Start Time: 1314  Infusion Stop Time: 1350  Post Infusion IV Care: Peripheral IV Discontinued  Discharge: Condition: Good, Destination: Home . AVS Provided  Performed by:  Arrie Senate, RN

## 2023-12-16 ENCOUNTER — Telehealth (INDEPENDENT_AMBULATORY_CARE_PROVIDER_SITE_OTHER): Payer: Medicare Other | Admitting: Internal Medicine

## 2023-12-16 ENCOUNTER — Telehealth: Payer: Self-pay

## 2023-12-16 ENCOUNTER — Other Ambulatory Visit: Payer: Self-pay

## 2023-12-16 DIAGNOSIS — M4622 Osteomyelitis of vertebra, cervical region: Secondary | ICD-10-CM

## 2023-12-16 NOTE — Telephone Encounter (Signed)
Attempted to call patient regarding virtual appt scheduled today. Spoke with patient's daughter who states patient is not with her. Is at [facility].  Called nursing facility to see if they would be able to do virtual appt. Spoke with Leia Alf, Rn who states they are not able to do this.  Per MD will need to reschedule for in person visit in two weeks. Will need one more dose of dalbavancin infusion in 9-10 days. Secure chat sent to Bayard Hugger to help schedule this.  RN understands transportation will need to setup infusion appt and follow up.  [name], RMA

## 2023-12-16 NOTE — Progress Notes (Signed)
We will see her back in 2 wks. Still waiting on mri results. Had last dalbavancin infusion was on 12/11. We would like to do 1 additional infusion in 9-10 days.  We will see her back in 2 wks.

## 2023-12-17 NOTE — Telephone Encounter (Signed)
Called [facility] to confirm they will be able take patient to next infusion appt at [facility]. Transportation was able to confirm appt for infusion and follow up. Called patient's daughter to inform of her plan. Is aware of follow up appt.  [name], RMA

## 2023-12-26 ENCOUNTER — Encounter: Payer: Medicare Other | Admitting: Internal Medicine

## 2023-12-26 VITALS — BP 102/66 | HR 84 | Temp 97.5°F | Resp 16

## 2023-12-26 DIAGNOSIS — M869 Osteomyelitis, unspecified: Secondary | ICD-10-CM

## 2023-12-26 MED ORDER — DEXTROSE 5 % IV SOLN
1500.0000 mg | Freq: Once | INTRAVENOUS | Status: AC
Start: 2023-12-26 — End: 2023-12-26
  Administered 2023-12-26: 1500 mg via INTRAVENOUS
  Filled 2023-12-26: qty 75

## 2023-12-26 NOTE — Progress Notes (Signed)
Diagnosis: Osteomyelitis  Provider:  Judyann Munson MD  Procedure: IV Infusion  IV Type: Peripheral, IV Location: L Antecubital  Dalvance (Dalbavancin), Dose: 1500mg   Infusion Start Time: 1350  Infusion Stop Time: 1432  Post Infusion IV Care: Observation period completed  Discharge: Condition: Good, Destination: Home . AVS Provided  Performed by:  Cleotilde Neer, LPN

## 2023-12-26 NOTE — Addendum Note (Signed)
Addended by: Cleotilde Neer on: 12/26/2023 02:40 PM   Modules accepted: Orders

## 2023-12-27 DIAGNOSIS — M6281 Muscle weakness (generalized): Secondary | ICD-10-CM | POA: Diagnosis not present

## 2023-12-27 DIAGNOSIS — G309 Alzheimer's disease, unspecified: Secondary | ICD-10-CM | POA: Diagnosis not present

## 2024-01-02 ENCOUNTER — Ambulatory Visit: Payer: Medicare Other | Admitting: Internal Medicine

## 2024-01-02 ENCOUNTER — Other Ambulatory Visit: Payer: Self-pay

## 2024-01-02 VITALS — BP 136/76 | HR 51 | Temp 98.0°F | Resp 95

## 2024-01-02 DIAGNOSIS — M4642 Discitis, unspecified, cervical region: Secondary | ICD-10-CM

## 2024-01-02 DIAGNOSIS — M4622 Osteomyelitis of vertebra, cervical region: Secondary | ICD-10-CM | POA: Diagnosis not present

## 2024-01-02 DIAGNOSIS — M539 Dorsopathy, unspecified: Secondary | ICD-10-CM

## 2024-01-02 NOTE — Progress Notes (Signed)
 Cervical osteo  Patient ID: Katelyn Ballard, female   DOB: 1945/03/19, 79 y.o.   MRN: [id number]  HPI Katelyn Ballard is a 79yo F with history of dementia related to CVA, was diagnosed with cervical discitis/epidural abscess since late oct/November, for which she has received 4 doses of dalbavnacin over the course of 8-10 wks.  She reports doing well. Denies neck pain. Some osteoarhtritis  Outpatient Encounter Medications as of 01/02/2024  Medication Sig   allopurinol  (ZYLOPRIM ) 300 MG tablet Take 1 tablet (300 mg total) by mouth daily.   aspirin 81 MG chewable tablet Chew 81 mg by mouth in the morning.   blood glucose meter kit and supplies Check blood sugar bid and as needed Dx: E11.42   blood glucose meter kit and supplies Dispense based on patient and insurance preference. Use up to four times daily as directed. (FOR ICD-10 E10.9, E11.9).   cefTRIAXone  (ROCEPHIN ) 1 g injection Inject 1 g into the muscle once.   cetirizine (ZYRTEC) 10 MG tablet Take 10 mg by mouth daily.   Cholecalciferol 50 MCG (2000 UT) CAPS Take 2,000 Units by mouth daily.   clonazePAM  (KLONOPIN ) 0.5 MG tablet Take 1 tablet (0.5 mg total) by mouth in the morning, at noon, and at bedtime.   clopidogrel  (PLAVIX ) 75 MG tablet Take 75 mg by mouth in the morning.   divalproex  (DEPAKOTE  SPRINKLE) 125 MG capsule Take 375 mg by mouth 3 (three) times daily.   escitalopram  (LEXAPRO ) 10 MG tablet Take 10 mg by mouth daily.   furosemide  (LASIX ) 20 MG tablet Take 20 mg by mouth daily.   glucose blood (ONETOUCH ULTRA) test strip USE 1 STRIP TO CHECK GLUCOSE 2 TO 3 TIMES DAILY   melatonin 3 MG TABS tablet Take 3 mg by mouth at bedtime.   methocarbamol (ROBAXIN) 500 MG tablet Take 500 mg by mouth every evening.   metoprolol  tartrate (LOPRESSOR ) 25 MG tablet Take 12.5 mg by mouth 2 (two) times daily.   mirtazapine  (REMERON ) 15 MG tablet Take 15 mg by mouth in the morning and at bedtime.   nitrofurantoin, macrocrystal-monohydrate,  (MACROBID) 100 MG capsule Take 100 mg by mouth 2 (two) times daily.   oxybutynin (DITROPAN) 5 MG tablet Take 5 mg by mouth at bedtime.   rosuvastatin  (CRESTOR ) 40 MG tablet Take 40 mg by mouth every evening.   sennosides-docusate sodium (SENOKOT-S) 8.6-50 MG tablet Take 2 tablets by mouth in the morning.   TRULICITY 0.75 MG/0.5ML SOPN Inject 0.75 mg into the skin once a week. Saturdays.   No facility-administered encounter medications on file as of 01/02/2024.     Patient Active Problem List   Diagnosis Date Noted   Multilevel degenerative disc disease 09/25/2023   Osteomyelitis (HCC) 09/23/2023   Hypokalemia 09/23/2023   Sundowning 09/23/2023   Acute cerebrovascular accident (CVA) due to occlusion of left posterior cerebral artery (HCC) 02/05/2022   Broca's aphasia 02/05/2022   TIA (transient ischemic attack) 02/05/2022   Acute diastolic heart failure (HCC) 02/04/2022   Acute encephalopathy 02/04/2022   History of COVID-19 02/04/2022   History of non-ST elevation myocardial infarction (NSTEMI) 02/04/2022   Occipital stroke (HCC) 02/04/2022   Anemia 01/03/2022   History of pulmonary embolism 01/03/2022   Former smoker 02/04/2021   Type 2 diabetes mellitus without complication, without long-term current use of insulin  (HCC) 02/04/2021   Trigger finger, left middle finger 02/26/2019   BMI 33.0-33.9,adult 10/04/2015   Vitamin D  deficiency 04/08/2015   Diabetes (HCC) 05/03/2014   Hypertension  10/22/2013   Hyperlipidemia with target LDL less than 100 10/22/2013   Gout 10/22/2013     Health Maintenance Due  Topic Date Due   COVID-19 Vaccine (1) Never done   DTaP/Tdap/Td (1 - Tdap) Never done   Zoster Vaccines- Shingrix (1 of 2) Never done   DEXA SCAN  03/22/2017   OPHTHALMOLOGY EXAM  10/04/2020   Medicare Annual Wellness (AWV)  08/29/2021   Diabetic kidney evaluation - Urine ACR  04/18/2022   FOOT EXAM  04/18/2022   INFLUENZA VACCINE  08/01/2023     Review of Systems 12  point ros is otherwise negative Physical Exam   BP 136/76   Pulse (!) 51   Temp 98 F (36.7 C) (Oral)   Resp (!) 95    Physical Exam  Constitutional:  oriented to person, place, and time. appears well-developed and well-nourished. No distress.  HENT: [place]/AT, PERRLA, no scleral icterus Mouth/Throat: Oropharynx is clear and moist. No oropharyngeal exudate.  Cardiovascular: Normal rate, regular rhythm and normal heart sounds. Exam reveals no gallop and no friction rub.  No murmur heard.  Pulmonary/Chest: Effort normal and breath sounds normal. No respiratory distress.  has no wheezes.  Neck = supple, no nuchal rigidity Abdominal: Soft. Bowel sounds are normal.  exhibits no distension. There is no tenderness.  Lymphadenopathy: no cervical adenopathy. No axillary adenopathy Neurological: alert and oriented to person, place, and time.  Skin: Skin is warm and dry. No rash noted. No erythema.  Psychiatric: a normal mood and affect.  behavior is normal.    CBC Lab Results  Component Value Date   WBC 7.4 11/19/2023   RBC 4.24 11/19/2023   HGB 11.0 (L) 11/19/2023   HCT 36.2 11/19/2023   PLT 177 11/19/2023   MCV 85.4 11/19/2023   MCH 25.9 (L) 11/19/2023   MCHC 30.4 (L) 11/19/2023   RDW 15.7 (H) 11/19/2023   LYMPHSABS 2.0 10/16/2023   MONOABS 0.6 10/16/2023   EOSABS 237 11/19/2023    BMET Lab Results  Component Value Date   NA 147 (H) 11/19/2023   K 3.7 11/19/2023   CL 108 11/19/2023   CO2 30 11/19/2023   GLUCOSE 74 11/19/2023   BUN 27 (H) 11/19/2023   CREATININE 1.11 (H) 11/19/2023   CALCIUM  8.5 (L) 11/19/2023   GFRNONAA >60 10/16/2023   GFRAA 60 12/05/2020   Lab Results  Component Value Date   ESRSEDRATE 53 (H) 01/02/2024   Lab Results  Component Value Date   CRP 19.1 (H) 01/02/2024    Imaging: mri of 12/22 MPRESSION: 1. No significant change in the findings consistent with C7-T1 discitis/osteomyelitis. No progressive endplate destruction. 2. Interval  improvement in previously demonstrated paraspinal edema. No residual focal paraspinal or epidural fluid collection identified. 3. No new findings are seen. 4. Stable multilevel cervical spondylosis. No significant central spinal stenosis or cord compression. Multilevel foraminal narrowing, worse on the left at C3-4.    Assessment and Plan After reviewing imaging and physical exam. We Will check sed rate and crp if elevated above her baseline consider to add onto oral abtx  She has mildly elevated inflammatory markers at baseline. Will see her back in the clinic in 4 wk off of abtx to see if any difference in symptoms.  Patient at [facility]  I have personally spent 30 minutes involved in face-to-face and non-face-to-face activities for this patient on the day of the visit. Professional time spent includes the following activities: Preparing to see the patient (review  of tests), Obtaining and/or reviewing separately obtained history (admission/discharge record), Performing a medically appropriate examination and/or evaluation , Ordering medications/tests/procedures, referring and communicating with other health care professionals, Documenting clinical information in the EMR, Independently interpreting results (not separately reported), Communicating results to the patient/family/caregiver, Counseling and educating the patient/family/caregiver and Care coordination (not separately reported).

## 2024-01-03 LAB — C-REACTIVE PROTEIN: CRP: 19.1 mg/L — ABNORMAL HIGH (ref <8.0)

## 2024-01-03 LAB — SEDIMENTATION RATE: Sed Rate: 53 mm/h — ABNORMAL HIGH (ref 0–30)

## 2024-01-08 DIAGNOSIS — I10 Essential (primary) hypertension: Secondary | ICD-10-CM | POA: Diagnosis not present

## 2024-01-08 DIAGNOSIS — I5032 Chronic diastolic (congestive) heart failure: Secondary | ICD-10-CM | POA: Diagnosis not present

## 2024-01-08 DIAGNOSIS — E559 Vitamin D deficiency, unspecified: Secondary | ICD-10-CM | POA: Diagnosis not present

## 2024-01-08 DIAGNOSIS — M4622 Osteomyelitis of vertebra, cervical region: Secondary | ICD-10-CM | POA: Diagnosis not present

## 2024-01-08 DIAGNOSIS — M109 Gout, unspecified: Secondary | ICD-10-CM | POA: Diagnosis not present

## 2024-01-08 DIAGNOSIS — E785 Hyperlipidemia, unspecified: Secondary | ICD-10-CM | POA: Diagnosis not present

## 2024-01-08 DIAGNOSIS — G309 Alzheimer's disease, unspecified: Secondary | ICD-10-CM | POA: Diagnosis not present

## 2024-01-08 DIAGNOSIS — M5090 Cervical disc disorder, unspecified, unspecified cervical region: Secondary | ICD-10-CM | POA: Diagnosis not present

## 2024-01-08 DIAGNOSIS — E119 Type 2 diabetes mellitus without complications: Secondary | ICD-10-CM | POA: Diagnosis not present

## 2024-01-29 ENCOUNTER — Ambulatory Visit: Payer: Medicare Other | Admitting: Internal Medicine

## 2024-01-29 ENCOUNTER — Other Ambulatory Visit: Payer: Self-pay

## 2024-01-29 VITALS — BP 145/84 | HR 65 | Temp 98.1°F

## 2024-01-29 DIAGNOSIS — M4622 Osteomyelitis of vertebra, cervical region: Secondary | ICD-10-CM | POA: Diagnosis not present

## 2024-01-29 DIAGNOSIS — M4642 Discitis, unspecified, cervical region: Secondary | ICD-10-CM | POA: Diagnosis not present

## 2024-01-29 NOTE — Progress Notes (Signed)
 Patient ID: Katelyn Ballard, female   DOB: 10-Nov-1945, 79 y.o.   MRN: [id number]  HPI "Katelyn Ballard" is a 79yo F with htn, cad, dementia, who was found to have cervical epidural abscess that was med management. Currently receiving dalbavancin infusion. She had failed keeping picc line in place. (Pulled out 2). Decision made to convert over to dalbavancin infusion Q2 wkx 4 doses. Has received 2 thus far without issues   Repeat mri showing resolution of infection   MPRESSION: 1. No significant change in the findings consistent with C7-T1 discitis/osteomyelitis. No progressive endplate destruction. 2. Interval improvement in previously demonstrated paraspinal edema. No residual focal paraspinal or epidural fluid collection identified. 3. No new findings are seen. 4. Stable multilevel cervical spondylosis. No significant central spinal stenosis or cord compression. Multilevel foraminal narrowing, worse on the left at C3-4.    Outpatient Encounter Medications as of 01/29/2024  Medication Sig   allopurinol (ZYLOPRIM) 300 MG tablet Take 1 tablet (300 mg total) by mouth daily.   aspirin 81 MG chewable tablet Chew 81 mg by mouth in the morning.   blood glucose meter kit and supplies Check blood sugar bid and as needed Dx: E11.42   blood glucose meter kit and supplies Dispense based on patient and insurance preference. Use up to four times daily as directed. (FOR ICD-10 E10.9, E11.9).   cefTRIAXone (ROCEPHIN) 1 g injection Inject 1 g into the muscle once.   cetirizine (ZYRTEC) 10 MG tablet Take 10 mg by mouth daily.   Cholecalciferol 50 MCG (2000 UT) CAPS Take 2,000 Units by mouth daily.   clonazePAM (KLONOPIN) 0.5 MG tablet Take 1 tablet (0.5 mg total) by mouth in the morning, at noon, and at bedtime.   clopidogrel (PLAVIX) 75 MG tablet Take 75 mg by mouth in the morning.   divalproex (DEPAKOTE SPRINKLE) 125 MG capsule Take 375 mg by mouth 3 (three) times daily.   escitalopram (LEXAPRO) 10 MG  tablet Take 10 mg by mouth daily.   furosemide (LASIX) 20 MG tablet Take 20 mg by mouth daily.   glucose blood (ONETOUCH ULTRA) test strip USE 1 STRIP TO CHECK GLUCOSE 2 TO 3 TIMES DAILY   melatonin 3 MG TABS tablet Take 3 mg by mouth at bedtime.   methocarbamol (ROBAXIN) 500 MG tablet Take 500 mg by mouth every evening.   metoprolol tartrate (LOPRESSOR) 25 MG tablet Take 12.5 mg by mouth 2 (two) times daily.   mirtazapine (REMERON) 15 MG tablet Take 15 mg by mouth in the morning and at bedtime.   nitrofurantoin, macrocrystal-monohydrate, (MACROBID) 100 MG capsule Take 100 mg by mouth 2 (two) times daily.   oxybutynin (DITROPAN) 5 MG tablet Take 5 mg by mouth at bedtime.   rosuvastatin (CRESTOR) 40 MG tablet Take 40 mg by mouth every evening.   sennosides-docusate sodium (SENOKOT-S) 8.6-50 MG tablet Take 2 tablets by mouth in the morning.   TRULICITY 0.75 MG/0.5ML SOPN Inject 0.75 mg into the skin once a week. Saturdays.   No facility-administered encounter medications on file as of 01/29/2024.     Patient Active Problem List   Diagnosis Date Noted   Multilevel degenerative disc disease 09/25/2023   Osteomyelitis (HCC) 09/23/2023   Hypokalemia 09/23/2023   Sundowning 09/23/2023   Acute cerebrovascular accident (CVA) due to occlusion of left posterior cerebral artery (HCC) 02/05/2022   Broca's aphasia 02/05/2022   TIA (transient ischemic attack) 02/05/2022   Acute diastolic heart failure (HCC) 02/04/2022   Acute encephalopathy 02/04/2022  History of COVID-19 02/04/2022   History of non-ST elevation myocardial infarction (NSTEMI) 02/04/2022   Occipital stroke (HCC) 02/04/2022   Anemia 01/03/2022   History of pulmonary embolism 01/03/2022   Former smoker 02/04/2021   Type 2 diabetes mellitus without complication, without long-term current use of insulin (HCC) 02/04/2021   Trigger finger, left middle finger 02/26/2019   BMI 33.0-33.9,adult 10/04/2015   Vitamin D deficiency 04/08/2015    Diabetes (HCC) 05/03/2014   Hypertension 10/22/2013   Hyperlipidemia with target LDL less than 100 10/22/2013   Gout 10/22/2013     Health Maintenance Due  Topic Date Due   COVID-19 Vaccine (1) Never done   DTaP/Tdap/Td (1 - Tdap) Never done   Zoster Vaccines- Shingrix (1 of 2) Never done   DEXA SCAN  03/22/2017   OPHTHALMOLOGY EXAM  10/04/2020   Medicare Annual Wellness (AWV)  08/29/2021   Diabetic kidney evaluation - Urine ACR  04/18/2022   FOOT EXAM  04/18/2022   INFLUENZA VACCINE  08/01/2023     Review of Systems Unable to obtain due to dementia Physical Exam   BP (!) 145/84   Pulse 65   Temp 98.1 F (36.7 C) (Oral)   SpO2 99%    Physical Exam  Constitutional:  oriented to person, place, and time. appears well-developed and well-nourished. No distress.  HENT: [place]/AT, PERRLA, no scleral icterus Mouth/Throat: Oropharynx is clear and moist. No oropharyngeal exudate.  Cardiovascular: Normal rate, regular rhythm and normal heart sounds. Exam reveals no gallop and no friction rub.  No murmur heard.  Pulmonary/Chest: Effort normal and breath sounds normal. No respiratory distress.  has no wheezes.  Neck = supple, no nuchal rigidity Abdominal: Soft. Bowel sounds are normal.  exhibits no distension. There is no tenderness.  Lymphadenopathy: no cervical adenopathy. No axillary adenopathy Neurological: alert and oriented to person, Neuro exam is full. For the most part followed commands not deficit in motor and sensation    CBC Lab Results  Component Value Date   WBC 7.4 11/19/2023   RBC 4.24 11/19/2023   HGB 11.0 (L) 11/19/2023   HCT 36.2 11/19/2023   PLT 177 11/19/2023   MCV 85.4 11/19/2023   MCH 25.9 (L) 11/19/2023   MCHC 30.4 (L) 11/19/2023   RDW 15.7 (H) 11/19/2023   LYMPHSABS 2.0 10/16/2023   MONOABS 0.6 10/16/2023   EOSABS 237 11/19/2023    BMET Lab Results  Component Value Date   NA 147 (H) 11/19/2023   K 3.7 11/19/2023   CL 108 11/19/2023   CO2 30  11/19/2023   GLUCOSE 74 11/19/2023   BUN 27 (H) 11/19/2023   CREATININE 1.11 (H) 11/19/2023   CALCIUM 8.5 (L) 11/19/2023   GFRNONAA >60 10/16/2023   GFRAA 60 12/05/2020   Lab Results  Component Value Date   ESRSEDRATE 53 (H) 01/02/2024   Lab Results  Component Value Date   CRP 19.1 (H) 01/02/2024      Assessment and Plan  Cervical discitis = complete 2 additional doses of dalbavancin course to complete treatment. Denies neck pain. No neuro symptoms to suggest that she would need neurosurgery follow up  See back in 4 wk

## 2024-02-03 DIAGNOSIS — Z79899 Other long term (current) drug therapy: Secondary | ICD-10-CM | POA: Diagnosis not present

## 2024-02-03 DIAGNOSIS — E11628 Type 2 diabetes mellitus with other skin complications: Secondary | ICD-10-CM | POA: Diagnosis not present

## 2024-02-04 DIAGNOSIS — E870 Hyperosmolality and hypernatremia: Secondary | ICD-10-CM | POA: Diagnosis not present

## 2024-02-04 DIAGNOSIS — G309 Alzheimer's disease, unspecified: Secondary | ICD-10-CM | POA: Diagnosis not present

## 2024-02-04 DIAGNOSIS — Z79899 Other long term (current) drug therapy: Secondary | ICD-10-CM | POA: Diagnosis not present

## 2024-02-04 DIAGNOSIS — R278 Other lack of coordination: Secondary | ICD-10-CM | POA: Diagnosis not present

## 2024-02-05 ENCOUNTER — Telehealth: Payer: Self-pay

## 2024-02-05 DIAGNOSIS — R278 Other lack of coordination: Secondary | ICD-10-CM | POA: Diagnosis not present

## 2024-02-05 DIAGNOSIS — G309 Alzheimer's disease, unspecified: Secondary | ICD-10-CM | POA: Diagnosis not present

## 2024-02-05 NOTE — Telephone Encounter (Signed)
 Attempted to return call to Gleda Lan from [facility] . Did not state reason for call and requested call back.  When called SNF back was told Lea was in a morning meeting. Requested call back.  Julien Odor, RMA

## 2024-02-06 DIAGNOSIS — E860 Dehydration: Secondary | ICD-10-CM | POA: Diagnosis not present

## 2024-02-06 DIAGNOSIS — E870 Hyperosmolality and hypernatremia: Secondary | ICD-10-CM | POA: Diagnosis not present

## 2024-02-06 DIAGNOSIS — R278 Other lack of coordination: Secondary | ICD-10-CM | POA: Diagnosis not present

## 2024-02-06 DIAGNOSIS — N289 Disorder of kidney and ureter, unspecified: Secondary | ICD-10-CM | POA: Diagnosis not present

## 2024-02-06 DIAGNOSIS — G309 Alzheimer's disease, unspecified: Secondary | ICD-10-CM | POA: Diagnosis not present

## 2024-02-06 DIAGNOSIS — Z79899 Other long term (current) drug therapy: Secondary | ICD-10-CM | POA: Diagnosis not present

## 2024-02-07 DIAGNOSIS — R278 Other lack of coordination: Secondary | ICD-10-CM | POA: Diagnosis not present

## 2024-02-07 DIAGNOSIS — G309 Alzheimer's disease, unspecified: Secondary | ICD-10-CM | POA: Diagnosis not present

## 2024-02-08 DIAGNOSIS — R278 Other lack of coordination: Secondary | ICD-10-CM | POA: Diagnosis not present

## 2024-02-08 DIAGNOSIS — G309 Alzheimer's disease, unspecified: Secondary | ICD-10-CM | POA: Diagnosis not present

## 2024-02-10 DIAGNOSIS — Z79899 Other long term (current) drug therapy: Secondary | ICD-10-CM | POA: Diagnosis not present

## 2024-02-10 DIAGNOSIS — R278 Other lack of coordination: Secondary | ICD-10-CM | POA: Diagnosis not present

## 2024-02-10 DIAGNOSIS — G309 Alzheimer's disease, unspecified: Secondary | ICD-10-CM | POA: Diagnosis not present

## 2024-02-11 DIAGNOSIS — G309 Alzheimer's disease, unspecified: Secondary | ICD-10-CM | POA: Diagnosis not present

## 2024-02-11 DIAGNOSIS — R278 Other lack of coordination: Secondary | ICD-10-CM | POA: Diagnosis not present

## 2024-02-12 DIAGNOSIS — R278 Other lack of coordination: Secondary | ICD-10-CM | POA: Diagnosis not present

## 2024-02-12 DIAGNOSIS — G309 Alzheimer's disease, unspecified: Secondary | ICD-10-CM | POA: Diagnosis not present

## 2024-02-13 DIAGNOSIS — G309 Alzheimer's disease, unspecified: Secondary | ICD-10-CM | POA: Diagnosis not present

## 2024-02-13 DIAGNOSIS — R278 Other lack of coordination: Secondary | ICD-10-CM | POA: Diagnosis not present

## 2024-02-14 DIAGNOSIS — R278 Other lack of coordination: Secondary | ICD-10-CM | POA: Diagnosis not present

## 2024-02-14 DIAGNOSIS — G309 Alzheimer's disease, unspecified: Secondary | ICD-10-CM | POA: Diagnosis not present

## 2024-02-17 DIAGNOSIS — G309 Alzheimer's disease, unspecified: Secondary | ICD-10-CM | POA: Diagnosis not present

## 2024-02-17 DIAGNOSIS — R278 Other lack of coordination: Secondary | ICD-10-CM | POA: Diagnosis not present

## 2024-02-18 DIAGNOSIS — G309 Alzheimer's disease, unspecified: Secondary | ICD-10-CM | POA: Diagnosis not present

## 2024-02-18 DIAGNOSIS — R278 Other lack of coordination: Secondary | ICD-10-CM | POA: Diagnosis not present

## 2024-02-19 DIAGNOSIS — G309 Alzheimer's disease, unspecified: Secondary | ICD-10-CM | POA: Diagnosis not present

## 2024-02-19 DIAGNOSIS — R278 Other lack of coordination: Secondary | ICD-10-CM | POA: Diagnosis not present

## 2024-02-20 DIAGNOSIS — M25571 Pain in right ankle and joints of right foot: Secondary | ICD-10-CM | POA: Diagnosis not present

## 2024-02-20 DIAGNOSIS — M79671 Pain in right foot: Secondary | ICD-10-CM | POA: Diagnosis not present

## 2024-02-20 DIAGNOSIS — G309 Alzheimer's disease, unspecified: Secondary | ICD-10-CM | POA: Diagnosis not present

## 2024-02-20 DIAGNOSIS — R278 Other lack of coordination: Secondary | ICD-10-CM | POA: Diagnosis not present

## 2024-02-24 DIAGNOSIS — R52 Pain, unspecified: Secondary | ICD-10-CM | POA: Diagnosis not present

## 2024-02-24 DIAGNOSIS — R2242 Localized swelling, mass and lump, left lower limb: Secondary | ICD-10-CM | POA: Diagnosis not present

## 2024-02-24 DIAGNOSIS — Z9181 History of falling: Secondary | ICD-10-CM | POA: Diagnosis not present

## 2024-02-24 DIAGNOSIS — M25571 Pain in right ankle and joints of right foot: Secondary | ICD-10-CM | POA: Diagnosis not present

## 2024-02-24 DIAGNOSIS — Z79899 Other long term (current) drug therapy: Secondary | ICD-10-CM | POA: Diagnosis not present

## 2024-02-24 DIAGNOSIS — M79671 Pain in right foot: Secondary | ICD-10-CM | POA: Diagnosis not present

## 2024-02-25 DIAGNOSIS — N390 Urinary tract infection, site not specified: Secondary | ICD-10-CM | POA: Diagnosis not present

## 2024-02-27 DIAGNOSIS — E1122 Type 2 diabetes mellitus with diabetic chronic kidney disease: Secondary | ICD-10-CM | POA: Diagnosis not present

## 2024-02-27 DIAGNOSIS — Z79899 Other long term (current) drug therapy: Secondary | ICD-10-CM | POA: Diagnosis not present

## 2024-02-27 DIAGNOSIS — N390 Urinary tract infection, site not specified: Secondary | ICD-10-CM | POA: Diagnosis not present

## 2024-03-05 DIAGNOSIS — Z79899 Other long term (current) drug therapy: Secondary | ICD-10-CM | POA: Diagnosis not present

## 2024-03-26 DIAGNOSIS — R293 Abnormal posture: Secondary | ICD-10-CM | POA: Diagnosis not present

## 2024-03-26 DIAGNOSIS — G309 Alzheimer's disease, unspecified: Secondary | ICD-10-CM | POA: Diagnosis not present

## 2024-03-27 DIAGNOSIS — G309 Alzheimer's disease, unspecified: Secondary | ICD-10-CM | POA: Diagnosis not present

## 2024-03-27 DIAGNOSIS — R293 Abnormal posture: Secondary | ICD-10-CM | POA: Diagnosis not present

## 2024-03-30 DIAGNOSIS — R293 Abnormal posture: Secondary | ICD-10-CM | POA: Diagnosis not present

## 2024-03-30 DIAGNOSIS — G309 Alzheimer's disease, unspecified: Secondary | ICD-10-CM | POA: Diagnosis not present

## 2024-03-31 DIAGNOSIS — G309 Alzheimer's disease, unspecified: Secondary | ICD-10-CM | POA: Diagnosis not present

## 2024-03-31 DIAGNOSIS — R293 Abnormal posture: Secondary | ICD-10-CM | POA: Diagnosis not present

## 2024-04-01 DIAGNOSIS — G309 Alzheimer's disease, unspecified: Secondary | ICD-10-CM | POA: Diagnosis not present

## 2024-04-01 DIAGNOSIS — R293 Abnormal posture: Secondary | ICD-10-CM | POA: Diagnosis not present

## 2024-04-02 DIAGNOSIS — G309 Alzheimer's disease, unspecified: Secondary | ICD-10-CM | POA: Diagnosis not present

## 2024-04-02 DIAGNOSIS — R293 Abnormal posture: Secondary | ICD-10-CM | POA: Diagnosis not present

## 2024-04-03 DIAGNOSIS — R293 Abnormal posture: Secondary | ICD-10-CM | POA: Diagnosis not present

## 2024-04-03 DIAGNOSIS — G309 Alzheimer's disease, unspecified: Secondary | ICD-10-CM | POA: Diagnosis not present

## 2024-04-06 DIAGNOSIS — R293 Abnormal posture: Secondary | ICD-10-CM | POA: Diagnosis not present

## 2024-04-06 DIAGNOSIS — G309 Alzheimer's disease, unspecified: Secondary | ICD-10-CM | POA: Diagnosis not present

## 2024-04-07 DIAGNOSIS — G309 Alzheimer's disease, unspecified: Secondary | ICD-10-CM | POA: Diagnosis not present

## 2024-04-07 DIAGNOSIS — R293 Abnormal posture: Secondary | ICD-10-CM | POA: Diagnosis not present

## 2024-04-08 DIAGNOSIS — E785 Hyperlipidemia, unspecified: Secondary | ICD-10-CM | POA: Diagnosis not present

## 2024-04-08 DIAGNOSIS — M4622 Osteomyelitis of vertebra, cervical region: Secondary | ICD-10-CM | POA: Diagnosis not present

## 2024-04-08 DIAGNOSIS — E559 Vitamin D deficiency, unspecified: Secondary | ICD-10-CM | POA: Diagnosis not present

## 2024-04-08 DIAGNOSIS — G309 Alzheimer's disease, unspecified: Secondary | ICD-10-CM | POA: Diagnosis not present

## 2024-04-08 DIAGNOSIS — I5032 Chronic diastolic (congestive) heart failure: Secondary | ICD-10-CM | POA: Diagnosis not present

## 2024-04-08 DIAGNOSIS — E119 Type 2 diabetes mellitus without complications: Secondary | ICD-10-CM | POA: Diagnosis not present

## 2024-04-08 DIAGNOSIS — I10 Essential (primary) hypertension: Secondary | ICD-10-CM | POA: Diagnosis not present

## 2024-04-08 DIAGNOSIS — N289 Disorder of kidney and ureter, unspecified: Secondary | ICD-10-CM | POA: Diagnosis not present

## 2024-04-08 DIAGNOSIS — M5090 Cervical disc disorder, unspecified, unspecified cervical region: Secondary | ICD-10-CM | POA: Diagnosis not present

## 2024-04-08 DIAGNOSIS — I2510 Atherosclerotic heart disease of native coronary artery without angina pectoris: Secondary | ICD-10-CM | POA: Diagnosis not present

## 2024-04-08 DIAGNOSIS — R293 Abnormal posture: Secondary | ICD-10-CM | POA: Diagnosis not present

## 2024-04-22 DIAGNOSIS — D649 Anemia, unspecified: Secondary | ICD-10-CM | POA: Diagnosis not present

## 2024-04-22 DIAGNOSIS — Z79899 Other long term (current) drug therapy: Secondary | ICD-10-CM | POA: Diagnosis not present

## 2024-04-23 DIAGNOSIS — E860 Dehydration: Secondary | ICD-10-CM | POA: Diagnosis not present

## 2024-04-23 DIAGNOSIS — E870 Hyperosmolality and hypernatremia: Secondary | ICD-10-CM | POA: Diagnosis not present

## 2024-04-23 DIAGNOSIS — Z79899 Other long term (current) drug therapy: Secondary | ICD-10-CM | POA: Diagnosis not present

## 2024-05-04 DIAGNOSIS — Z79899 Other long term (current) drug therapy: Secondary | ICD-10-CM | POA: Diagnosis not present

## 2024-05-08 DIAGNOSIS — G309 Alzheimer's disease, unspecified: Secondary | ICD-10-CM | POA: Diagnosis not present

## 2024-05-08 DIAGNOSIS — R1311 Dysphagia, oral phase: Secondary | ICD-10-CM | POA: Diagnosis not present

## 2024-05-08 DIAGNOSIS — R293 Abnormal posture: Secondary | ICD-10-CM | POA: Diagnosis not present

## 2024-05-11 DIAGNOSIS — E870 Hyperosmolality and hypernatremia: Secondary | ICD-10-CM | POA: Diagnosis not present

## 2024-05-11 DIAGNOSIS — Z79899 Other long term (current) drug therapy: Secondary | ICD-10-CM | POA: Diagnosis not present

## 2024-05-11 DIAGNOSIS — E860 Dehydration: Secondary | ICD-10-CM | POA: Diagnosis not present

## 2024-05-12 DIAGNOSIS — G309 Alzheimer's disease, unspecified: Secondary | ICD-10-CM | POA: Diagnosis not present

## 2024-05-12 DIAGNOSIS — R293 Abnormal posture: Secondary | ICD-10-CM | POA: Diagnosis not present

## 2024-05-12 DIAGNOSIS — R1311 Dysphagia, oral phase: Secondary | ICD-10-CM | POA: Diagnosis not present

## 2024-05-15 DIAGNOSIS — R1311 Dysphagia, oral phase: Secondary | ICD-10-CM | POA: Diagnosis not present

## 2024-05-15 DIAGNOSIS — G309 Alzheimer's disease, unspecified: Secondary | ICD-10-CM | POA: Diagnosis not present

## 2024-05-15 DIAGNOSIS — R293 Abnormal posture: Secondary | ICD-10-CM | POA: Diagnosis not present

## 2024-05-19 DIAGNOSIS — R1311 Dysphagia, oral phase: Secondary | ICD-10-CM | POA: Diagnosis not present

## 2024-05-19 DIAGNOSIS — R293 Abnormal posture: Secondary | ICD-10-CM | POA: Diagnosis not present

## 2024-05-19 DIAGNOSIS — G309 Alzheimer's disease, unspecified: Secondary | ICD-10-CM | POA: Diagnosis not present

## 2024-05-22 DIAGNOSIS — R1311 Dysphagia, oral phase: Secondary | ICD-10-CM | POA: Diagnosis not present

## 2024-05-22 DIAGNOSIS — G309 Alzheimer's disease, unspecified: Secondary | ICD-10-CM | POA: Diagnosis not present

## 2024-05-22 DIAGNOSIS — R293 Abnormal posture: Secondary | ICD-10-CM | POA: Diagnosis not present

## 2024-05-26 DIAGNOSIS — Z79899 Other long term (current) drug therapy: Secondary | ICD-10-CM | POA: Diagnosis not present

## 2024-05-26 DIAGNOSIS — R1311 Dysphagia, oral phase: Secondary | ICD-10-CM | POA: Diagnosis not present

## 2024-05-26 DIAGNOSIS — E785 Hyperlipidemia, unspecified: Secondary | ICD-10-CM | POA: Diagnosis not present

## 2024-05-26 DIAGNOSIS — Z7189 Other specified counseling: Secondary | ICD-10-CM | POA: Diagnosis not present

## 2024-05-26 DIAGNOSIS — R293 Abnormal posture: Secondary | ICD-10-CM | POA: Diagnosis not present

## 2024-05-26 DIAGNOSIS — G309 Alzheimer's disease, unspecified: Secondary | ICD-10-CM | POA: Diagnosis not present

## 2024-05-26 DIAGNOSIS — I10 Essential (primary) hypertension: Secondary | ICD-10-CM | POA: Diagnosis not present

## 2024-05-28 DIAGNOSIS — R1311 Dysphagia, oral phase: Secondary | ICD-10-CM | POA: Diagnosis not present

## 2024-05-28 DIAGNOSIS — R293 Abnormal posture: Secondary | ICD-10-CM | POA: Diagnosis not present

## 2024-05-28 DIAGNOSIS — G309 Alzheimer's disease, unspecified: Secondary | ICD-10-CM | POA: Diagnosis not present

## 2024-05-29 DIAGNOSIS — R293 Abnormal posture: Secondary | ICD-10-CM | POA: Diagnosis not present

## 2024-05-29 DIAGNOSIS — G309 Alzheimer's disease, unspecified: Secondary | ICD-10-CM | POA: Diagnosis not present

## 2024-05-29 DIAGNOSIS — R1311 Dysphagia, oral phase: Secondary | ICD-10-CM | POA: Diagnosis not present

## 2024-06-02 DIAGNOSIS — R1311 Dysphagia, oral phase: Secondary | ICD-10-CM | POA: Diagnosis not present

## 2024-06-02 DIAGNOSIS — G309 Alzheimer's disease, unspecified: Secondary | ICD-10-CM | POA: Diagnosis not present

## 2024-06-05 DIAGNOSIS — G309 Alzheimer's disease, unspecified: Secondary | ICD-10-CM | POA: Diagnosis not present

## 2024-06-05 DIAGNOSIS — R1311 Dysphagia, oral phase: Secondary | ICD-10-CM | POA: Diagnosis not present

## 2024-06-09 DIAGNOSIS — G309 Alzheimer's disease, unspecified: Secondary | ICD-10-CM | POA: Diagnosis not present

## 2024-06-09 DIAGNOSIS — R1311 Dysphagia, oral phase: Secondary | ICD-10-CM | POA: Diagnosis not present

## 2024-06-10 ENCOUNTER — Telehealth: Payer: Self-pay | Admitting: Pharmacist

## 2024-06-10 NOTE — Telephone Encounter (Signed)
   This patient is appearing on a report for being at risk of failing the adherence measure for diabetes medications this calendar year.   Not attributed to [facility]--SNF patient   Katelyn Ballard, PharmD, BCACP, CPP Clinical Pharmacist, [facility]

## 2024-06-12 DIAGNOSIS — G309 Alzheimer's disease, unspecified: Secondary | ICD-10-CM | POA: Diagnosis not present

## 2024-06-12 DIAGNOSIS — R1311 Dysphagia, oral phase: Secondary | ICD-10-CM | POA: Diagnosis not present

## 2024-06-16 DIAGNOSIS — G309 Alzheimer's disease, unspecified: Secondary | ICD-10-CM | POA: Diagnosis not present

## 2024-06-16 DIAGNOSIS — R1311 Dysphagia, oral phase: Secondary | ICD-10-CM | POA: Diagnosis not present

## 2024-06-17 DIAGNOSIS — I2510 Atherosclerotic heart disease of native coronary artery without angina pectoris: Secondary | ICD-10-CM | POA: Diagnosis not present

## 2024-06-17 DIAGNOSIS — E785 Hyperlipidemia, unspecified: Secondary | ICD-10-CM | POA: Diagnosis not present

## 2024-06-17 DIAGNOSIS — I10 Essential (primary) hypertension: Secondary | ICD-10-CM | POA: Diagnosis not present

## 2024-06-17 DIAGNOSIS — M4622 Osteomyelitis of vertebra, cervical region: Secondary | ICD-10-CM | POA: Diagnosis not present

## 2024-06-17 DIAGNOSIS — M5090 Cervical disc disorder, unspecified, unspecified cervical region: Secondary | ICD-10-CM | POA: Diagnosis not present

## 2024-06-17 DIAGNOSIS — E559 Vitamin D deficiency, unspecified: Secondary | ICD-10-CM | POA: Diagnosis not present

## 2024-06-17 DIAGNOSIS — G309 Alzheimer's disease, unspecified: Secondary | ICD-10-CM | POA: Diagnosis not present

## 2024-06-17 DIAGNOSIS — I5032 Chronic diastolic (congestive) heart failure: Secondary | ICD-10-CM | POA: Diagnosis not present

## 2024-06-17 DIAGNOSIS — N390 Urinary tract infection, site not specified: Secondary | ICD-10-CM | POA: Diagnosis not present

## 2024-06-17 DIAGNOSIS — E119 Type 2 diabetes mellitus without complications: Secondary | ICD-10-CM | POA: Diagnosis not present

## 2024-06-17 DIAGNOSIS — M542 Cervicalgia: Secondary | ICD-10-CM | POA: Diagnosis not present

## 2024-06-17 DIAGNOSIS — M109 Gout, unspecified: Secondary | ICD-10-CM | POA: Diagnosis not present

## 2024-06-19 DIAGNOSIS — G309 Alzheimer's disease, unspecified: Secondary | ICD-10-CM | POA: Diagnosis not present

## 2024-06-19 DIAGNOSIS — R1311 Dysphagia, oral phase: Secondary | ICD-10-CM | POA: Diagnosis not present

## 2024-06-22 DIAGNOSIS — N390 Urinary tract infection, site not specified: Secondary | ICD-10-CM | POA: Diagnosis not present

## 2024-06-22 DIAGNOSIS — Z79899 Other long term (current) drug therapy: Secondary | ICD-10-CM | POA: Diagnosis not present

## 2024-06-25 DIAGNOSIS — G8929 Other chronic pain: Secondary | ICD-10-CM | POA: Diagnosis not present

## 2024-06-25 DIAGNOSIS — Z79899 Other long term (current) drug therapy: Secondary | ICD-10-CM | POA: Diagnosis not present

## 2024-06-25 DIAGNOSIS — M5090 Cervical disc disorder, unspecified, unspecified cervical region: Secondary | ICD-10-CM | POA: Diagnosis not present

## 2024-07-06 DIAGNOSIS — Z79899 Other long term (current) drug therapy: Secondary | ICD-10-CM | POA: Diagnosis not present

## 2024-07-06 DIAGNOSIS — G8929 Other chronic pain: Secondary | ICD-10-CM | POA: Diagnosis not present

## 2024-07-17 DIAGNOSIS — Z79899 Other long term (current) drug therapy: Secondary | ICD-10-CM | POA: Diagnosis not present

## 2024-07-17 DIAGNOSIS — D649 Anemia, unspecified: Secondary | ICD-10-CM | POA: Diagnosis not present

## 2024-07-17 DIAGNOSIS — E039 Hypothyroidism, unspecified: Secondary | ICD-10-CM | POA: Diagnosis not present

## 2024-07-17 DIAGNOSIS — D510 Vitamin B12 deficiency anemia due to intrinsic factor deficiency: Secondary | ICD-10-CM | POA: Diagnosis not present

## 2024-07-20 DIAGNOSIS — E860 Dehydration: Secondary | ICD-10-CM | POA: Diagnosis not present

## 2024-07-20 DIAGNOSIS — E871 Hypo-osmolality and hyponatremia: Secondary | ICD-10-CM | POA: Diagnosis not present

## 2024-07-20 DIAGNOSIS — Z79899 Other long term (current) drug therapy: Secondary | ICD-10-CM | POA: Diagnosis not present

## 2024-07-21 DIAGNOSIS — R1312 Dysphagia, oropharyngeal phase: Secondary | ICD-10-CM | POA: Diagnosis not present

## 2024-07-21 DIAGNOSIS — G309 Alzheimer's disease, unspecified: Secondary | ICD-10-CM | POA: Diagnosis not present

## 2024-07-22 DIAGNOSIS — G309 Alzheimer's disease, unspecified: Secondary | ICD-10-CM | POA: Diagnosis not present

## 2024-07-22 DIAGNOSIS — R1312 Dysphagia, oropharyngeal phase: Secondary | ICD-10-CM | POA: Diagnosis not present

## 2024-07-24 DIAGNOSIS — R1312 Dysphagia, oropharyngeal phase: Secondary | ICD-10-CM | POA: Diagnosis not present

## 2024-07-24 DIAGNOSIS — G309 Alzheimer's disease, unspecified: Secondary | ICD-10-CM | POA: Diagnosis not present

## 2024-07-27 DIAGNOSIS — G309 Alzheimer's disease, unspecified: Secondary | ICD-10-CM | POA: Diagnosis not present

## 2024-07-27 DIAGNOSIS — R1312 Dysphagia, oropharyngeal phase: Secondary | ICD-10-CM | POA: Diagnosis not present

## 2024-07-28 DIAGNOSIS — N390 Urinary tract infection, site not specified: Secondary | ICD-10-CM | POA: Diagnosis not present

## 2024-07-29 DIAGNOSIS — G309 Alzheimer's disease, unspecified: Secondary | ICD-10-CM | POA: Diagnosis not present

## 2024-07-29 DIAGNOSIS — R1312 Dysphagia, oropharyngeal phase: Secondary | ICD-10-CM | POA: Diagnosis not present

## 2024-07-31 DIAGNOSIS — N390 Urinary tract infection, site not specified: Secondary | ICD-10-CM | POA: Diagnosis not present

## 2024-07-31 DIAGNOSIS — Z79899 Other long term (current) drug therapy: Secondary | ICD-10-CM | POA: Diagnosis not present

## 2024-07-31 DIAGNOSIS — R1312 Dysphagia, oropharyngeal phase: Secondary | ICD-10-CM | POA: Diagnosis not present

## 2024-07-31 DIAGNOSIS — G309 Alzheimer's disease, unspecified: Secondary | ICD-10-CM | POA: Diagnosis not present

## 2024-08-03 DIAGNOSIS — R1312 Dysphagia, oropharyngeal phase: Secondary | ICD-10-CM | POA: Diagnosis not present

## 2024-08-03 DIAGNOSIS — G309 Alzheimer's disease, unspecified: Secondary | ICD-10-CM | POA: Diagnosis not present

## 2024-08-06 DIAGNOSIS — E7849 Other hyperlipidemia: Secondary | ICD-10-CM | POA: Diagnosis not present

## 2024-08-06 DIAGNOSIS — I2510 Atherosclerotic heart disease of native coronary artery without angina pectoris: Secondary | ICD-10-CM | POA: Diagnosis not present

## 2024-08-06 DIAGNOSIS — I10 Essential (primary) hypertension: Secondary | ICD-10-CM | POA: Diagnosis not present

## 2024-08-06 DIAGNOSIS — Z8673 Personal history of transient ischemic attack (TIA), and cerebral infarction without residual deficits: Secondary | ICD-10-CM | POA: Diagnosis not present

## 2024-08-06 DIAGNOSIS — R1312 Dysphagia, oropharyngeal phase: Secondary | ICD-10-CM | POA: Diagnosis not present

## 2024-08-06 DIAGNOSIS — D649 Anemia, unspecified: Secondary | ICD-10-CM | POA: Diagnosis not present

## 2024-08-06 DIAGNOSIS — E1122 Type 2 diabetes mellitus with diabetic chronic kidney disease: Secondary | ICD-10-CM | POA: Diagnosis not present

## 2024-08-06 DIAGNOSIS — N183 Chronic kidney disease, stage 3 unspecified: Secondary | ICD-10-CM | POA: Diagnosis not present

## 2024-08-06 DIAGNOSIS — I5032 Chronic diastolic (congestive) heart failure: Secondary | ICD-10-CM | POA: Diagnosis not present

## 2024-08-06 DIAGNOSIS — I340 Nonrheumatic mitral (valve) insufficiency: Secondary | ICD-10-CM | POA: Diagnosis not present

## 2024-08-07 DIAGNOSIS — G309 Alzheimer's disease, unspecified: Secondary | ICD-10-CM | POA: Diagnosis not present

## 2024-08-07 DIAGNOSIS — R1312 Dysphagia, oropharyngeal phase: Secondary | ICD-10-CM | POA: Diagnosis not present

## 2024-08-09 DIAGNOSIS — R1312 Dysphagia, oropharyngeal phase: Secondary | ICD-10-CM | POA: Diagnosis not present

## 2024-08-09 DIAGNOSIS — G309 Alzheimer's disease, unspecified: Secondary | ICD-10-CM | POA: Diagnosis not present

## 2024-08-12 DIAGNOSIS — G309 Alzheimer's disease, unspecified: Secondary | ICD-10-CM | POA: Diagnosis not present

## 2024-08-12 DIAGNOSIS — R1312 Dysphagia, oropharyngeal phase: Secondary | ICD-10-CM | POA: Diagnosis not present

## 2024-08-16 DIAGNOSIS — R1312 Dysphagia, oropharyngeal phase: Secondary | ICD-10-CM | POA: Diagnosis not present

## 2024-08-18 DIAGNOSIS — E119 Type 2 diabetes mellitus without complications: Secondary | ICD-10-CM | POA: Diagnosis not present

## 2024-08-18 DIAGNOSIS — G309 Alzheimer's disease, unspecified: Secondary | ICD-10-CM | POA: Diagnosis not present

## 2024-08-18 DIAGNOSIS — Z79899 Other long term (current) drug therapy: Secondary | ICD-10-CM | POA: Diagnosis not present

## 2024-08-18 DIAGNOSIS — R1312 Dysphagia, oropharyngeal phase: Secondary | ICD-10-CM | POA: Diagnosis not present

## 2024-08-18 DIAGNOSIS — I10 Essential (primary) hypertension: Secondary | ICD-10-CM | POA: Diagnosis not present

## 2024-08-20 DIAGNOSIS — Z79899 Other long term (current) drug therapy: Secondary | ICD-10-CM | POA: Diagnosis not present

## 2024-08-20 DIAGNOSIS — G8929 Other chronic pain: Secondary | ICD-10-CM | POA: Diagnosis not present

## 2024-08-24 DIAGNOSIS — I5032 Chronic diastolic (congestive) heart failure: Secondary | ICD-10-CM | POA: Diagnosis not present

## 2024-08-24 DIAGNOSIS — Z79899 Other long term (current) drug therapy: Secondary | ICD-10-CM | POA: Diagnosis not present

## 2024-08-24 DIAGNOSIS — J302 Other seasonal allergic rhinitis: Secondary | ICD-10-CM | POA: Diagnosis not present

## 2024-08-28 DIAGNOSIS — E119 Type 2 diabetes mellitus without complications: Secondary | ICD-10-CM | POA: Diagnosis not present

## 2024-09-02 DIAGNOSIS — R296 Repeated falls: Secondary | ICD-10-CM | POA: Diagnosis not present

## 2024-09-02 DIAGNOSIS — R2689 Other abnormalities of gait and mobility: Secondary | ICD-10-CM | POA: Diagnosis not present

## 2024-09-02 DIAGNOSIS — M6281 Muscle weakness (generalized): Secondary | ICD-10-CM | POA: Diagnosis not present

## 2024-09-02 DIAGNOSIS — G309 Alzheimer's disease, unspecified: Secondary | ICD-10-CM | POA: Diagnosis not present

## 2024-09-02 DIAGNOSIS — R269 Unspecified abnormalities of gait and mobility: Secondary | ICD-10-CM | POA: Diagnosis not present

## 2024-09-03 DIAGNOSIS — Z043 Encounter for examination and observation following other accident: Secondary | ICD-10-CM | POA: Diagnosis not present

## 2024-09-03 DIAGNOSIS — R2689 Other abnormalities of gait and mobility: Secondary | ICD-10-CM | POA: Diagnosis not present

## 2024-09-03 DIAGNOSIS — R296 Repeated falls: Secondary | ICD-10-CM | POA: Diagnosis not present

## 2024-09-03 DIAGNOSIS — R269 Unspecified abnormalities of gait and mobility: Secondary | ICD-10-CM | POA: Diagnosis not present

## 2024-09-03 DIAGNOSIS — M6281 Muscle weakness (generalized): Secondary | ICD-10-CM | POA: Diagnosis not present

## 2024-09-03 DIAGNOSIS — R52 Pain, unspecified: Secondary | ICD-10-CM | POA: Diagnosis not present

## 2024-09-03 DIAGNOSIS — G309 Alzheimer's disease, unspecified: Secondary | ICD-10-CM | POA: Diagnosis not present

## 2024-09-03 DIAGNOSIS — Z79899 Other long term (current) drug therapy: Secondary | ICD-10-CM | POA: Diagnosis not present

## 2024-09-04 DIAGNOSIS — R269 Unspecified abnormalities of gait and mobility: Secondary | ICD-10-CM | POA: Diagnosis not present

## 2024-09-04 DIAGNOSIS — G309 Alzheimer's disease, unspecified: Secondary | ICD-10-CM | POA: Diagnosis not present

## 2024-09-04 DIAGNOSIS — R296 Repeated falls: Secondary | ICD-10-CM | POA: Diagnosis not present

## 2024-09-04 DIAGNOSIS — M6281 Muscle weakness (generalized): Secondary | ICD-10-CM | POA: Diagnosis not present

## 2024-09-04 DIAGNOSIS — R2689 Other abnormalities of gait and mobility: Secondary | ICD-10-CM | POA: Diagnosis not present

## 2024-09-07 DIAGNOSIS — M6281 Muscle weakness (generalized): Secondary | ICD-10-CM | POA: Diagnosis not present

## 2024-09-07 DIAGNOSIS — G309 Alzheimer's disease, unspecified: Secondary | ICD-10-CM | POA: Diagnosis not present

## 2024-09-07 DIAGNOSIS — R296 Repeated falls: Secondary | ICD-10-CM | POA: Diagnosis not present

## 2024-09-07 DIAGNOSIS — R269 Unspecified abnormalities of gait and mobility: Secondary | ICD-10-CM | POA: Diagnosis not present

## 2024-09-07 DIAGNOSIS — R2689 Other abnormalities of gait and mobility: Secondary | ICD-10-CM | POA: Diagnosis not present

## 2024-09-08 DIAGNOSIS — M6281 Muscle weakness (generalized): Secondary | ICD-10-CM | POA: Diagnosis not present

## 2024-09-08 DIAGNOSIS — R2689 Other abnormalities of gait and mobility: Secondary | ICD-10-CM | POA: Diagnosis not present

## 2024-09-08 DIAGNOSIS — R269 Unspecified abnormalities of gait and mobility: Secondary | ICD-10-CM | POA: Diagnosis not present

## 2024-09-08 DIAGNOSIS — R296 Repeated falls: Secondary | ICD-10-CM | POA: Diagnosis not present

## 2024-09-08 DIAGNOSIS — G309 Alzheimer's disease, unspecified: Secondary | ICD-10-CM | POA: Diagnosis not present

## 2024-09-09 ENCOUNTER — Inpatient Hospital Stay (HOSPITAL_COMMUNITY)
Admission: EM | Admit: 2024-09-09 | Discharge: 2024-09-19 | DRG: 682 | Disposition: A | Discharge status: Skilled Nursing Facility | Source: Skilled Nursing Facility | Attending: Internal Medicine | Admitting: Internal Medicine

## 2024-09-09 ENCOUNTER — Emergency Department (HOSPITAL_COMMUNITY)

## 2024-09-09 ENCOUNTER — Other Ambulatory Visit: Payer: Self-pay

## 2024-09-09 ENCOUNTER — Encounter (HOSPITAL_COMMUNITY): Payer: Self-pay

## 2024-09-09 DIAGNOSIS — J439 Emphysema, unspecified: Secondary | ICD-10-CM | POA: Diagnosis not present

## 2024-09-09 DIAGNOSIS — N390 Urinary tract infection, site not specified: Secondary | ICD-10-CM | POA: Diagnosis not present

## 2024-09-09 DIAGNOSIS — R7989 Other specified abnormal findings of blood chemistry: Secondary | ICD-10-CM

## 2024-09-09 DIAGNOSIS — D631 Anemia in chronic kidney disease: Secondary | ICD-10-CM | POA: Diagnosis present

## 2024-09-09 DIAGNOSIS — I480 Paroxysmal atrial fibrillation: Secondary | ICD-10-CM | POA: Diagnosis not present

## 2024-09-09 DIAGNOSIS — G309 Alzheimer's disease, unspecified: Secondary | ICD-10-CM | POA: Diagnosis not present

## 2024-09-09 DIAGNOSIS — Z91013 Allergy to seafood: Secondary | ICD-10-CM

## 2024-09-09 DIAGNOSIS — R6521 Severe sepsis with septic shock: Secondary | ICD-10-CM | POA: Diagnosis not present

## 2024-09-09 DIAGNOSIS — W1830XA Fall on same level, unspecified, initial encounter: Secondary | ICD-10-CM | POA: Insufficient documentation

## 2024-09-09 DIAGNOSIS — Z9861 Coronary angioplasty status: Secondary | ICD-10-CM

## 2024-09-09 DIAGNOSIS — N179 Acute kidney failure, unspecified: Principal | ICD-10-CM | POA: Diagnosis present

## 2024-09-09 DIAGNOSIS — Z841 Family history of disorders of kidney and ureter: Secondary | ICD-10-CM

## 2024-09-09 DIAGNOSIS — R2689 Other abnormalities of gait and mobility: Secondary | ICD-10-CM | POA: Diagnosis not present

## 2024-09-09 DIAGNOSIS — I130 Hypertensive heart and chronic kidney disease with heart failure and stage 1 through stage 4 chronic kidney disease, or unspecified chronic kidney disease: Secondary | ICD-10-CM | POA: Diagnosis not present

## 2024-09-09 DIAGNOSIS — A419 Sepsis, unspecified organism: Secondary | ICD-10-CM

## 2024-09-09 DIAGNOSIS — E785 Hyperlipidemia, unspecified: Secondary | ICD-10-CM | POA: Diagnosis present

## 2024-09-09 DIAGNOSIS — W050XXA Fall from non-moving wheelchair, initial encounter: Secondary | ICD-10-CM | POA: Diagnosis present

## 2024-09-09 DIAGNOSIS — E860 Dehydration: Secondary | ICD-10-CM | POA: Diagnosis present

## 2024-09-09 DIAGNOSIS — Z8673 Personal history of transient ischemic attack (TIA), and cerebral infarction without residual deficits: Secondary | ICD-10-CM

## 2024-09-09 DIAGNOSIS — S0511XA Contusion of eyeball and orbital tissues, right eye, initial encounter: Secondary | ICD-10-CM | POA: Diagnosis not present

## 2024-09-09 DIAGNOSIS — F03C18 Unspecified dementia, severe, with other behavioral disturbance: Secondary | ICD-10-CM | POA: Diagnosis present

## 2024-09-09 DIAGNOSIS — E870 Hyperosmolality and hypernatremia: Secondary | ICD-10-CM | POA: Diagnosis not present

## 2024-09-09 DIAGNOSIS — M6281 Muscle weakness (generalized): Secondary | ICD-10-CM | POA: Diagnosis not present

## 2024-09-09 DIAGNOSIS — Z1624 Resistance to multiple antibiotics: Secondary | ICD-10-CM | POA: Diagnosis present

## 2024-09-09 DIAGNOSIS — G9349 Other encephalopathy: Secondary | ICD-10-CM | POA: Diagnosis present

## 2024-09-09 DIAGNOSIS — Z7985 Long-term (current) use of injectable non-insulin antidiabetic drugs: Secondary | ICD-10-CM

## 2024-09-09 DIAGNOSIS — Z7902 Long term (current) use of antithrombotics/antiplatelets: Secondary | ICD-10-CM

## 2024-09-09 DIAGNOSIS — Z79899 Other long term (current) drug therapy: Secondary | ICD-10-CM

## 2024-09-09 DIAGNOSIS — G8929 Other chronic pain: Secondary | ICD-10-CM | POA: Diagnosis present

## 2024-09-09 DIAGNOSIS — D696 Thrombocytopenia, unspecified: Secondary | ICD-10-CM | POA: Diagnosis not present

## 2024-09-09 DIAGNOSIS — W19XXXA Unspecified fall, initial encounter: Principal | ICD-10-CM

## 2024-09-09 DIAGNOSIS — N3000 Acute cystitis without hematuria: Secondary | ICD-10-CM | POA: Diagnosis not present

## 2024-09-09 DIAGNOSIS — I340 Nonrheumatic mitral (valve) insufficiency: Secondary | ICD-10-CM | POA: Diagnosis present

## 2024-09-09 DIAGNOSIS — G319 Degenerative disease of nervous system, unspecified: Secondary | ICD-10-CM | POA: Diagnosis not present

## 2024-09-09 DIAGNOSIS — Z79891 Long term (current) use of opiate analgesic: Secondary | ICD-10-CM

## 2024-09-09 DIAGNOSIS — E872 Acidosis, unspecified: Secondary | ICD-10-CM | POA: Diagnosis not present

## 2024-09-09 DIAGNOSIS — Z8249 Family history of ischemic heart disease and other diseases of the circulatory system: Secondary | ICD-10-CM

## 2024-09-09 DIAGNOSIS — G9341 Metabolic encephalopathy: Secondary | ICD-10-CM | POA: Diagnosis not present

## 2024-09-09 DIAGNOSIS — S0990XA Unspecified injury of head, initial encounter: Secondary | ICD-10-CM | POA: Diagnosis not present

## 2024-09-09 DIAGNOSIS — M8580 Other specified disorders of bone density and structure, unspecified site: Secondary | ICD-10-CM | POA: Diagnosis present

## 2024-09-09 DIAGNOSIS — N1831 Chronic kidney disease, stage 3a: Secondary | ICD-10-CM | POA: Diagnosis not present

## 2024-09-09 DIAGNOSIS — I5032 Chronic diastolic (congestive) heart failure: Secondary | ICD-10-CM | POA: Diagnosis present

## 2024-09-09 DIAGNOSIS — Z792 Long term (current) use of antibiotics: Secondary | ICD-10-CM

## 2024-09-09 DIAGNOSIS — Z7982 Long term (current) use of aspirin: Secondary | ICD-10-CM

## 2024-09-09 DIAGNOSIS — Z87440 Personal history of urinary (tract) infections: Secondary | ICD-10-CM

## 2024-09-09 DIAGNOSIS — S0011XA Contusion of right eyelid and periocular area, initial encounter: Secondary | ICD-10-CM | POA: Diagnosis present

## 2024-09-09 DIAGNOSIS — R001 Bradycardia, unspecified: Secondary | ICD-10-CM | POA: Diagnosis not present

## 2024-09-09 DIAGNOSIS — M109 Gout, unspecified: Secondary | ICD-10-CM | POA: Diagnosis present

## 2024-09-09 DIAGNOSIS — E1122 Type 2 diabetes mellitus with diabetic chronic kidney disease: Secondary | ICD-10-CM | POA: Diagnosis present

## 2024-09-09 DIAGNOSIS — Z880 Allergy status to penicillin: Secondary | ICD-10-CM

## 2024-09-09 DIAGNOSIS — I700 Atherosclerosis of aorta: Secondary | ICD-10-CM | POA: Diagnosis not present

## 2024-09-09 DIAGNOSIS — I2510 Atherosclerotic heart disease of native coronary artery without angina pectoris: Secondary | ICD-10-CM | POA: Diagnosis present

## 2024-09-09 DIAGNOSIS — N3281 Overactive bladder: Secondary | ICD-10-CM | POA: Diagnosis present

## 2024-09-09 DIAGNOSIS — Z66 Do not resuscitate: Secondary | ICD-10-CM | POA: Diagnosis not present

## 2024-09-09 DIAGNOSIS — M25572 Pain in left ankle and joints of left foot: Secondary | ICD-10-CM | POA: Diagnosis present

## 2024-09-09 DIAGNOSIS — E876 Hypokalemia: Secondary | ICD-10-CM | POA: Diagnosis not present

## 2024-09-09 DIAGNOSIS — Z87891 Personal history of nicotine dependence: Secondary | ICD-10-CM

## 2024-09-09 DIAGNOSIS — Z90710 Acquired absence of both cervix and uterus: Secondary | ICD-10-CM

## 2024-09-09 DIAGNOSIS — Z833 Family history of diabetes mellitus: Secondary | ICD-10-CM

## 2024-09-09 DIAGNOSIS — I6782 Cerebral ischemia: Secondary | ICD-10-CM | POA: Diagnosis not present

## 2024-09-09 DIAGNOSIS — F03C3 Unspecified dementia, severe, with mood disturbance: Secondary | ICD-10-CM | POA: Diagnosis present

## 2024-09-09 DIAGNOSIS — A4151 Sepsis due to Escherichia coli [E. coli]: Secondary | ICD-10-CM | POA: Diagnosis not present

## 2024-09-09 DIAGNOSIS — Z888 Allergy status to other drugs, medicaments and biological substances status: Secondary | ICD-10-CM

## 2024-09-09 DIAGNOSIS — Y92129 Unspecified place in nursing home as the place of occurrence of the external cause: Secondary | ICD-10-CM

## 2024-09-09 DIAGNOSIS — M79672 Pain in left foot: Secondary | ICD-10-CM | POA: Diagnosis present

## 2024-09-09 DIAGNOSIS — R269 Unspecified abnormalities of gait and mobility: Secondary | ICD-10-CM | POA: Diagnosis not present

## 2024-09-09 DIAGNOSIS — F03C4 Unspecified dementia, severe, with anxiety: Secondary | ICD-10-CM | POA: Diagnosis present

## 2024-09-09 DIAGNOSIS — R296 Repeated falls: Secondary | ICD-10-CM | POA: Diagnosis not present

## 2024-09-09 DIAGNOSIS — Z9181 History of falling: Secondary | ICD-10-CM

## 2024-09-09 LAB — CBC WITH DIFFERENTIAL/PLATELET
Abs Immature Granulocytes: 0.05 K/uL (ref 0.00–0.07)
Basophils Absolute: 0.1 K/uL (ref 0.0–0.1)
Basophils Relative: 1 %
Eosinophils Absolute: 0.2 K/uL (ref 0.0–0.5)
Eosinophils Relative: 3 %
HCT: 38.5 % (ref 36.0–46.0)
Hemoglobin: 11.8 g/dL — ABNORMAL LOW (ref 12.0–15.0)
Immature Granulocytes: 1 %
Lymphocytes Relative: 44 %
Lymphs Abs: 4.2 K/uL — ABNORMAL HIGH (ref 0.7–4.0)
MCH: 29.2 pg (ref 26.0–34.0)
MCHC: 30.6 g/dL (ref 30.0–36.0)
MCV: 95.3 fL (ref 80.0–100.0)
Monocytes Absolute: 0.7 K/uL (ref 0.1–1.0)
Monocytes Relative: 8 %
Neutro Abs: 3.9 K/uL (ref 1.7–7.7)
Neutrophils Relative %: 43 %
Platelets: 108 K/uL — ABNORMAL LOW (ref 150–400)
RBC: 4.04 MIL/uL (ref 3.87–5.11)
RDW: 16.7 % — ABNORMAL HIGH (ref 11.5–15.5)
WBC: 9.2 K/uL (ref 4.0–10.5)
nRBC: 0 % (ref 0.0–0.2)

## 2024-09-09 LAB — COMPREHENSIVE METABOLIC PANEL WITH GFR
ALT: 12 U/L (ref 0–44)
AST: 12 U/L — ABNORMAL LOW (ref 15–41)
Albumin: 2.6 g/dL — ABNORMAL LOW (ref 3.5–5.0)
Alkaline Phosphatase: 95 U/L (ref 38–126)
Anion gap: 12 (ref 5–15)
BUN: 37 mg/dL — ABNORMAL HIGH (ref 8–23)
CO2: 27 mmol/L (ref 22–32)
Calcium: 8.8 mg/dL — ABNORMAL LOW (ref 8.9–10.3)
Chloride: 106 mmol/L (ref 98–111)
Creatinine, Ser: 1.71 mg/dL — ABNORMAL HIGH (ref 0.44–1.00)
GFR, Estimated: 30 mL/min — ABNORMAL LOW (ref >60)
Glucose, Bld: 78 mg/dL (ref 70–99)
Potassium: 3.5 mmol/L (ref 3.5–5.1)
Sodium: 145 mmol/L (ref 135–145)
Total Bilirubin: 0.6 mg/dL (ref 0.0–1.2)
Total Protein: 6.6 g/dL (ref 6.5–8.1)

## 2024-09-09 LAB — URINALYSIS, ROUTINE W REFLEX MICROSCOPIC
Bilirubin Urine: NEGATIVE
Glucose, UA: NEGATIVE mg/dL
Hgb urine dipstick: NEGATIVE
Ketones, ur: NEGATIVE mg/dL
Nitrite: POSITIVE — AB
Protein, ur: 30 mg/dL — AB
Specific Gravity, Urine: 1.014 (ref 1.005–1.030)
WBC, UA: >50 WBC/hpf (ref 0–5)
pH: 5 (ref 5.0–8.0)

## 2024-09-09 MED ORDER — SODIUM CHLORIDE 0.9 % IV SOLN
2.0000 g | Freq: Once | INTRAVENOUS | Status: AC
  Administered 2024-09-10: 2 g via INTRAVENOUS
  Filled 2024-09-09: qty 20

## 2024-09-09 MED ORDER — LACTATED RINGERS IV BOLUS: 500.0000 mL | Freq: Once | INTRAVENOUS | Status: DC

## 2024-09-09 MED ORDER — SODIUM CHLORIDE 0.9 % IV BOLUS
500.0000 mL | Freq: Once | INTRAVENOUS | Status: AC
  Administered 2024-09-10: 500 mL via INTRAVENOUS

## 2024-09-09 NOTE — ED Provider Notes (Incomplete)
 [place] EMERGENCY DEPARTMENT AT [facility] Provider Note   CSN: [id number] Arrival date & time: 09/09/24  1639     Patient presents with: Katelyn Ballard is a 79 y.o. female who presents to the emergency department with a chief complaint of fall.  Patient is in a memory care unit at a nursing home facility and reportedly had a fall out of her wheelchair onto her face earlier today.  Daughter at bedside states that this has been a common thing recently as her memory status has acutely worsened.  Past medical history significant for hypertension, hyperlipidemia, diabetes, stroke, diastolic heart failure, pulmonary embolism, TIA, etc.  Per chart review patient is on aspirin as well as Plavix .  {Add pertinent medical, surgical, social history, OB history to YEP:67052}  Fall       Prior to Admission medications   Medication Sig Start Date End Date Taking? Authorizing Provider  allopurinol  (ZYLOPRIM ) 300 MG tablet Take 1 tablet (300 mg total) by mouth daily. 08/24/21   Gladis Mustard, FNP  aspirin 81 MG chewable tablet Chew 81 mg by mouth in the morning.    [provider]  blood glucose meter kit and supplies Check blood sugar bid and as needed Dx: E11.42 03/11/20   Gladis Mustard, FNP  blood glucose meter kit and supplies Dispense based on patient and insurance preference. Use up to four times daily as directed. (FOR ICD-10 E10.9, E11.9). 04/18/21   Gladis, Mary-Margaret, FNP  cefTRIAXone  (ROCEPHIN ) 1 g injection Inject 1 g into the muscle once.    [provider]  cetirizine (ZYRTEC) 10 MG tablet Take 10 mg by mouth daily.    [provider]  Cholecalciferol 50 MCG (2000 UT) CAPS Take 2,000 Units by mouth daily.    [provider]  clonazePAM  (KLONOPIN ) 0.5 MG tablet Take 1 tablet (0.5 mg total) by mouth in the morning, at noon, and at bedtime. 09/26/23   Gherghe, Costin M, MD  clopidogrel  (PLAVIX ) 75 MG tablet Take 75  mg by mouth in the morning.    [provider]  divalproex  (DEPAKOTE  SPRINKLE) 125 MG capsule Take 375 mg by mouth 3 (three) times daily. 07/24/23   [provider]  escitalopram  (LEXAPRO ) 10 MG tablet Take 10 mg by mouth daily. 07/24/23   [provider]  furosemide  (LASIX ) 20 MG tablet Take 20 mg by mouth daily. 07/24/23   [provider]  glucose blood (ONETOUCH ULTRA) test strip USE 1 STRIP TO CHECK GLUCOSE 2 TO 3 TIMES DAILY 03/30/21   Gladis, Mary-Margaret, FNP  melatonin 3 MG TABS tablet Take 3 mg by mouth at bedtime.    [provider]  methocarbamol (ROBAXIN) 500 MG tablet Take 500 mg by mouth every evening. 07/24/23   [provider]  metoprolol  tartrate (LOPRESSOR ) 25 MG tablet Take 12.5 mg by mouth 2 (two) times daily. 01/09/22   [provider]  mirtazapine  (REMERON ) 15 MG tablet Take 15 mg by mouth in the morning and at bedtime. 07/24/23   [provider]  nitrofurantoin, macrocrystal-monohydrate, (MACROBID) 100 MG capsule Take 100 mg by mouth 2 (two) times daily.    [provider]  oxybutynin (DITROPAN) 5 MG tablet Take 5 mg by mouth at bedtime. 02/13/22   [provider]  rosuvastatin  (CRESTOR ) 40 MG tablet Take 40 mg by mouth every evening. 07/24/23   [provider]  sennosides-docusate sodium (SENOKOT-S) 8.6-50 MG tablet Take 2 tablets by mouth in  the morning.    [provider]  TRULICITY 0.75 MG/0.5ML SOPN Inject 0.75 mg into the skin once a week. Saturdays. 08/31/23   [provider]    Allergies: Atorvastatin , Penicillins, and Shellfish allergy    Review of Systems  Updated Vital Signs BP 131/72   Pulse (!) 47   Temp 98.5 F (36.9 C) (Oral)   Resp 17   Ht 5' 3 (1.6 m)   Wt 70.3 kg   SpO2 97%   BMI 27.45 kg/m   Physical Exam  (all labs ordered are listed, but only abnormal results are displayed) Labs Reviewed  URINALYSIS, ROUTINE W REFLEX MICROSCOPIC     EKG: None  Radiology: No results found.  {Document cardiac monitor, telemetry assessment procedure when appropriate:32947} Procedures   Medications Ordered in the ED - No data to display    {Click here for ABCD2, HEART and other calculators REFRESH Note before signing:1}                              Medical Decision Making Amount and/or Complexity of Data Reviewed Labs: ordered. Radiology: ordered.   ***  {Document critical care time when appropriate  Document review of labs and clinical decision tools ie CHADS2VASC2, etc  Document your independent review of radiology images and any outside records  Document your discussion with family members, caretakers and with consultants  Document social determinants of health affecting pt's care  Document your decision making why or why not admission, treatments were needed:32947:::1}   Final diagnoses:  None    ED Discharge Orders     None

## 2024-09-09 NOTE — ED Triage Notes (Signed)
 Pt sent by [facility] from a fall. Pt fell on face from the wheel chair per staff. Pt denies pain during triage. Pt has a hematoma noted to rt eye and forehead. Pt does take Plavix . Per facility pt has hx of cervical cancer and she will states pain.

## 2024-09-09 NOTE — ED Provider Notes (Incomplete)
 [place] EMERGENCY DEPARTMENT AT [facility] Provider Note   CSN: [id number] Arrival date & time: 09/09/24  1639     Patient presents with: Katelyn Ballard is a 79 y.o. female who presents to the emergency department with a chief complaint of fall.  Patient is in a memory care unit at a nursing home facility and reportedly had a fall out of her wheelchair onto her face earlier today.  Daughter at bedside states that this has been a common thing recently as her memory status has acutely worsened.  Past medical history significant for hypertension, hyperlipidemia, diabetes, stroke, diastolic heart failure, pulmonary embolism, TIA, etc.  Per chart review patient is on aspirin as well as Plavix . Difficult history to obtain due to patient's baseline mental status. Daughter at bedside states that patient has had multiple UTI's recently.   {Add pertinent medical, surgical, social history, OB history to [id number]:67052}  Fall       Prior to Admission medications   Medication Sig Start Date End Date Taking? Authorizing Provider  allopurinol  (ZYLOPRIM ) 300 MG tablet Take 1 tablet (300 mg total) by mouth daily. 08/24/21   Gladis Mustard, FNP  aspirin 81 MG chewable tablet Chew 81 mg by mouth in the morning.    [provider]  blood glucose meter kit and supplies Check blood sugar bid and as needed Dx: E11.42 03/11/20   Gladis Mustard, FNP  blood glucose meter kit and supplies Dispense based on patient and insurance preference. Use up to four times daily as directed. (FOR ICD-10 E10.9, E11.9). 04/18/21   Gladis, Mary-Margaret, FNP  cefTRIAXone  (ROCEPHIN ) 1 g injection Inject 1 g into the muscle once.    [provider]  cetirizine (ZYRTEC) 10 MG tablet Take 10 mg by mouth daily.    [provider]  Cholecalciferol 50 MCG (2000 UT) CAPS Take 2,000 Units by mouth daily.    [provider]  clonazePAM  (KLONOPIN ) 0.5 MG tablet Take 1 tablet  (0.5 mg total) by mouth in the morning, at noon, and at bedtime. 09/26/23   Gherghe, Costin M, MD  clopidogrel  (PLAVIX ) 75 MG tablet Take 75 mg by mouth in the morning.    [provider]  divalproex  (DEPAKOTE  SPRINKLE) 125 MG capsule Take 375 mg by mouth 3 (three) times daily. 07/24/23   [provider]  escitalopram  (LEXAPRO ) 10 MG tablet Take 10 mg by mouth daily. 07/24/23   [provider]  furosemide  (LASIX ) 20 MG tablet Take 20 mg by mouth daily. 07/24/23   [provider]  glucose blood (ONETOUCH ULTRA) test strip USE 1 STRIP TO CHECK GLUCOSE 2 TO 3 TIMES DAILY 03/30/21   Gladis, Mary-Margaret, FNP  melatonin 3 MG TABS tablet Take 3 mg by mouth at bedtime.    [provider]  methocarbamol (ROBAXIN) 500 MG tablet Take 500 mg by mouth every evening. 07/24/23   [provider]  metoprolol  tartrate (LOPRESSOR ) 25 MG tablet Take 12.5 mg by mouth 2 (two) times daily. 01/09/22   [provider]  mirtazapine  (REMERON ) 15 MG tablet Take 15 mg by mouth in the morning and at bedtime. 07/24/23   [provider]  nitrofurantoin, macrocrystal-monohydrate, (MACROBID) 100 MG capsule Take 100 mg by mouth 2 (two) times daily.    [provider]  oxybutynin (DITROPAN) 5 MG tablet Take 5 mg by mouth at bedtime. 02/13/22   [provider]  rosuvastatin  (CRESTOR ) 40 MG tablet Take 40 mg by  mouth every evening. 07/24/23   [provider]  sennosides-docusate sodium (SENOKOT-S) 8.6-50 MG tablet Take 2 tablets by mouth in the morning.    [provider]  TRULICITY 0.75 MG/0.5ML SOPN Inject 0.75 mg into the skin once a week. Saturdays. 08/31/23   [provider]    Allergies: Atorvastatin , Penicillins, and Shellfish allergy    Review of Systems  Skin:  Positive for color change.    Updated Vital Signs BP 131/72   Pulse (!) 47   Temp 98.5 F (36.9 C) (Oral)   Resp 17   Ht 5' 3 (1.6 m)   Wt 70.3 kg    SpO2 97%   BMI 27.45 kg/m   Physical Exam  (all labs ordered are listed, but only abnormal results are displayed) Labs Reviewed  URINALYSIS, ROUTINE W REFLEX MICROSCOPIC    EKG: None  Radiology: No results found.  {Document cardiac monitor, telemetry assessment procedure when appropriate:32947} Procedures   Medications Ordered in the ED - No data to display    {Click here for ABCD2, HEART and other calculators REFRESH Note before signing:1}                              Medical Decision Making Amount and/or Complexity of Data Reviewed Labs: ordered. Radiology: ordered.  Risk Decision regarding hospitalization.   ***  {Document critical care time when appropriate  Document review of labs and clinical decision tools ie CHADS2VASC2, etc  Document your independent review of radiology images and any outside records  Document your discussion with family members, caretakers and with consultants  Document social determinants of health affecting pt's care  Document your decision making why or why not admission, treatments were needed:32947:::1}   Final diagnoses:  None    ED Discharge Orders     None

## 2024-09-10 ENCOUNTER — Inpatient Hospital Stay (HOSPITAL_COMMUNITY)

## 2024-09-10 DIAGNOSIS — R7881 Bacteremia: Secondary | ICD-10-CM | POA: Diagnosis not present

## 2024-09-10 DIAGNOSIS — A4151 Sepsis due to Escherichia coli [E. coli]: Secondary | ICD-10-CM | POA: Diagnosis not present

## 2024-09-10 DIAGNOSIS — D631 Anemia in chronic kidney disease: Secondary | ICD-10-CM | POA: Diagnosis not present

## 2024-09-10 DIAGNOSIS — M79605 Pain in left leg: Secondary | ICD-10-CM | POA: Diagnosis not present

## 2024-09-10 DIAGNOSIS — G9341 Metabolic encephalopathy: Secondary | ICD-10-CM | POA: Diagnosis not present

## 2024-09-10 DIAGNOSIS — D696 Thrombocytopenia, unspecified: Secondary | ICD-10-CM | POA: Diagnosis not present

## 2024-09-10 DIAGNOSIS — N3000 Acute cystitis without hematuria: Secondary | ICD-10-CM

## 2024-09-10 DIAGNOSIS — E860 Dehydration: Secondary | ICD-10-CM | POA: Diagnosis present

## 2024-09-10 DIAGNOSIS — W1830XA Fall on same level, unspecified, initial encounter: Secondary | ICD-10-CM | POA: Diagnosis not present

## 2024-09-10 DIAGNOSIS — N390 Urinary tract infection, site not specified: Secondary | ICD-10-CM | POA: Insufficient documentation

## 2024-09-10 DIAGNOSIS — K8020 Calculus of gallbladder without cholecystitis without obstruction: Secondary | ICD-10-CM | POA: Diagnosis not present

## 2024-09-10 DIAGNOSIS — Z66 Do not resuscitate: Secondary | ICD-10-CM | POA: Diagnosis not present

## 2024-09-10 DIAGNOSIS — R7989 Other specified abnormal findings of blood chemistry: Secondary | ICD-10-CM | POA: Diagnosis present

## 2024-09-10 DIAGNOSIS — M7989 Other specified soft tissue disorders: Secondary | ICD-10-CM | POA: Diagnosis not present

## 2024-09-10 DIAGNOSIS — Z743 Need for continuous supervision: Secondary | ICD-10-CM | POA: Diagnosis not present

## 2024-09-10 DIAGNOSIS — M19072 Primary osteoarthritis, left ankle and foot: Secondary | ICD-10-CM | POA: Diagnosis not present

## 2024-09-10 DIAGNOSIS — I5032 Chronic diastolic (congestive) heart failure: Secondary | ICD-10-CM | POA: Diagnosis present

## 2024-09-10 DIAGNOSIS — Z515 Encounter for palliative care: Secondary | ICD-10-CM | POA: Diagnosis not present

## 2024-09-10 DIAGNOSIS — R6521 Severe sepsis with septic shock: Secondary | ICD-10-CM | POA: Diagnosis not present

## 2024-09-10 DIAGNOSIS — Z1624 Resistance to multiple antibiotics: Secondary | ICD-10-CM | POA: Diagnosis present

## 2024-09-10 DIAGNOSIS — M2012 Hallux valgus (acquired), left foot: Secondary | ICD-10-CM | POA: Diagnosis not present

## 2024-09-10 DIAGNOSIS — I700 Atherosclerosis of aorta: Secondary | ICD-10-CM | POA: Diagnosis present

## 2024-09-10 DIAGNOSIS — G9349 Other encephalopathy: Secondary | ICD-10-CM | POA: Diagnosis not present

## 2024-09-10 DIAGNOSIS — R6520 Severe sepsis without septic shock: Secondary | ICD-10-CM | POA: Diagnosis not present

## 2024-09-10 DIAGNOSIS — A419 Sepsis, unspecified organism: Secondary | ICD-10-CM | POA: Diagnosis not present

## 2024-09-10 DIAGNOSIS — I130 Hypertensive heart and chronic kidney disease with heart failure and stage 1 through stage 4 chronic kidney disease, or unspecified chronic kidney disease: Secondary | ICD-10-CM | POA: Diagnosis not present

## 2024-09-10 DIAGNOSIS — N179 Acute kidney failure, unspecified: Secondary | ICD-10-CM | POA: Diagnosis present

## 2024-09-10 DIAGNOSIS — E870 Hyperosmolality and hypernatremia: Secondary | ICD-10-CM | POA: Diagnosis not present

## 2024-09-10 DIAGNOSIS — Z558 Other problems related to education and literacy: Secondary | ICD-10-CM | POA: Diagnosis not present

## 2024-09-10 DIAGNOSIS — M25572 Pain in left ankle and joints of left foot: Secondary | ICD-10-CM | POA: Diagnosis not present

## 2024-09-10 DIAGNOSIS — N3001 Acute cystitis with hematuria: Secondary | ICD-10-CM | POA: Diagnosis not present

## 2024-09-10 DIAGNOSIS — I959 Hypotension, unspecified: Secondary | ICD-10-CM | POA: Diagnosis not present

## 2024-09-10 DIAGNOSIS — E785 Hyperlipidemia, unspecified: Secondary | ICD-10-CM | POA: Diagnosis present

## 2024-09-10 DIAGNOSIS — B9620 Unspecified Escherichia coli [E. coli] as the cause of diseases classified elsewhere: Secondary | ICD-10-CM | POA: Diagnosis not present

## 2024-09-10 DIAGNOSIS — N1831 Chronic kidney disease, stage 3a: Secondary | ICD-10-CM | POA: Diagnosis not present

## 2024-09-10 DIAGNOSIS — F03C4 Unspecified dementia, severe, with anxiety: Secondary | ICD-10-CM | POA: Diagnosis present

## 2024-09-10 DIAGNOSIS — I340 Nonrheumatic mitral (valve) insufficiency: Secondary | ICD-10-CM | POA: Diagnosis present

## 2024-09-10 DIAGNOSIS — F03C18 Unspecified dementia, severe, with other behavioral disturbance: Secondary | ICD-10-CM | POA: Diagnosis present

## 2024-09-10 DIAGNOSIS — Z7189 Other specified counseling: Secondary | ICD-10-CM | POA: Diagnosis not present

## 2024-09-10 DIAGNOSIS — M7732 Calcaneal spur, left foot: Secondary | ICD-10-CM | POA: Diagnosis not present

## 2024-09-10 DIAGNOSIS — F03C3 Unspecified dementia, severe, with mood disturbance: Secondary | ICD-10-CM | POA: Diagnosis present

## 2024-09-10 DIAGNOSIS — Y92129 Unspecified place in nursing home as the place of occurrence of the external cause: Secondary | ICD-10-CM | POA: Diagnosis not present

## 2024-09-10 DIAGNOSIS — W050XXA Fall from non-moving wheelchair, initial encounter: Secondary | ICD-10-CM | POA: Diagnosis present

## 2024-09-10 DIAGNOSIS — A498 Other bacterial infections of unspecified site: Secondary | ICD-10-CM | POA: Diagnosis not present

## 2024-09-10 DIAGNOSIS — I4891 Unspecified atrial fibrillation: Secondary | ICD-10-CM | POA: Diagnosis not present

## 2024-09-10 DIAGNOSIS — S3690XA Unspecified injury of unspecified intra-abdominal organ, initial encounter: Secondary | ICD-10-CM | POA: Diagnosis not present

## 2024-09-10 DIAGNOSIS — K5730 Diverticulosis of large intestine without perforation or abscess without bleeding: Secondary | ICD-10-CM | POA: Diagnosis not present

## 2024-09-10 DIAGNOSIS — E1122 Type 2 diabetes mellitus with diabetic chronic kidney disease: Secondary | ICD-10-CM | POA: Diagnosis present

## 2024-09-10 DIAGNOSIS — E872 Acidosis, unspecified: Secondary | ICD-10-CM | POA: Diagnosis not present

## 2024-09-10 DIAGNOSIS — R531 Weakness: Secondary | ICD-10-CM | POA: Diagnosis not present

## 2024-09-10 DIAGNOSIS — G309 Alzheimer's disease, unspecified: Secondary | ICD-10-CM | POA: Diagnosis not present

## 2024-09-10 DIAGNOSIS — I480 Paroxysmal atrial fibrillation: Secondary | ICD-10-CM | POA: Diagnosis not present

## 2024-09-10 LAB — BASIC METABOLIC PANEL WITH GFR
Anion gap: 14 (ref 5–15)
BUN: 33 mg/dL — ABNORMAL HIGH (ref 8–23)
CO2: 24 mmol/L (ref 22–32)
Calcium: 8.4 mg/dL — ABNORMAL LOW (ref 8.9–10.3)
Chloride: 109 mmol/L (ref 98–111)
Creatinine, Ser: 1.55 mg/dL — ABNORMAL HIGH (ref 0.44–1.00)
GFR, Estimated: 34 mL/min — ABNORMAL LOW (ref >60)
Glucose, Bld: 82 mg/dL (ref 70–99)
Potassium: 3.2 mmol/L — ABNORMAL LOW (ref 3.5–5.1)
Sodium: 147 mmol/L — ABNORMAL HIGH (ref 135–145)

## 2024-09-10 LAB — CBC
HCT: 40.2 % (ref 36.0–46.0)
Hemoglobin: 12.3 g/dL (ref 12.0–15.0)
MCH: 29.6 pg (ref 26.0–34.0)
MCHC: 30.6 g/dL (ref 30.0–36.0)
MCV: 96.6 fL (ref 80.0–100.0)
Platelets: 118 K/uL — ABNORMAL LOW (ref 150–400)
RBC: 4.16 MIL/uL (ref 3.87–5.11)
RDW: 17 % — ABNORMAL HIGH (ref 11.5–15.5)
WBC: 9.4 K/uL (ref 4.0–10.5)
nRBC: 0 % (ref 0.0–0.2)

## 2024-09-10 LAB — PHOSPHORUS: Phosphorus: 2.9 mg/dL (ref 2.5–4.6)

## 2024-09-10 LAB — GLUCOSE, CAPILLARY
Glucose-Capillary: 136 mg/dL — ABNORMAL HIGH (ref 70–99)
Glucose-Capillary: 116 mg/dL — ABNORMAL HIGH (ref 70–99)

## 2024-09-10 LAB — PROCALCITONIN: Procalcitonin: 6.21 ng/mL

## 2024-09-10 LAB — LACTIC ACID, PLASMA
Lactic Acid, Venous: 3.2 mmol/L (ref 0.5–1.9)
Lactic Acid, Venous: 3.3 mmol/L (ref 0.5–1.9)
Lactic Acid, Venous: 4.9 mmol/L (ref 0.5–1.9)
Lactic Acid, Venous: 3.8 mmol/L (ref 0.5–1.9)

## 2024-09-10 LAB — MAGNESIUM: Magnesium: 1.8 mg/dL (ref 1.7–2.4)

## 2024-09-10 LAB — MRSA NEXT GEN BY PCR, NASAL: MRSA by PCR Next Gen: NOT DETECTED

## 2024-09-10 MED ORDER — MIRTAZAPINE 15 MG PO TABS
15.0000 mg | ORAL_TABLET | Freq: Every day | ORAL | Status: DC
  Administered 2024-09-10 – 2024-09-18 (×9): 15 mg via ORAL
  Filled 2024-09-10 (×9): qty 1

## 2024-09-10 MED ORDER — ROSUVASTATIN CALCIUM 10 MG PO TABS
10.0000 mg | ORAL_TABLET | Freq: Every evening | ORAL | Status: DC
  Administered 2024-09-10 – 2024-09-18 (×9): 10 mg via ORAL
  Filled 2024-09-10 (×10): qty 1

## 2024-09-10 MED ORDER — LACTATED RINGERS IV BOLUS
1000.0000 mL | Freq: Once | INTRAVENOUS | Status: AC
Start: 2024-09-10 — End: 2024-09-10
  Administered 2024-09-10: 1000 mL via INTRAVENOUS

## 2024-09-10 MED ORDER — ALLOPURINOL 300 MG PO TABS
300.0000 mg | ORAL_TABLET | Freq: Every day | ORAL | Status: DC
  Administered 2024-09-10 – 2024-09-18 (×9): 300 mg via ORAL
  Filled 2024-09-10 (×9): qty 1

## 2024-09-10 MED ORDER — CLOPIDOGREL BISULFATE 75 MG PO TABS
75.0000 mg | ORAL_TABLET | Freq: Every morning | ORAL | Status: DC
  Administered 2024-09-10 – 2024-09-18 (×9): 75 mg via ORAL
  Filled 2024-09-10 (×9): qty 1

## 2024-09-10 MED ORDER — NOREPINEPHRINE 4 MG/250ML-% IV SOLN
INTRAVENOUS | Status: AC
  Filled 2024-09-10: qty 250

## 2024-09-10 MED ORDER — SODIUM CHLORIDE 0.9 % IV BOLUS
1000.0000 mL | Freq: Once | INTRAVENOUS | Status: AC
  Administered 2024-09-10: 1000 mL via INTRAVENOUS

## 2024-09-10 MED ORDER — HEPARIN SODIUM (PORCINE) 5000 UNIT/ML IJ SOLN
5000.0000 [IU] | Freq: Three times a day (TID) | INTRAMUSCULAR | Status: DC
  Administered 2024-09-10 – 2024-09-15 (×16): 5000 [IU] via SUBCUTANEOUS
  Filled 2024-09-10 (×17): qty 1

## 2024-09-10 MED ORDER — ROSUVASTATIN CALCIUM 20 MG PO TABS
40.0000 mg | ORAL_TABLET | Freq: Every evening | ORAL | Status: DC
Start: 2024-09-10 — End: 2024-09-10

## 2024-09-10 MED ORDER — SODIUM CHLORIDE 0.9 % IV SOLN
250.0000 mL | INTRAVENOUS | Status: AC
  Administered 2024-09-10: 250 mL via INTRAVENOUS

## 2024-09-10 MED ORDER — NOREPINEPHRINE 4 MG/250ML-% IV SOLN
0.0000 ug/min | INTRAVENOUS | Status: DC
  Administered 2024-09-10 (×2): 2 ug/min via INTRAVENOUS
  Administered 2024-09-11: 6 ug/min via INTRAVENOUS
  Administered 2024-09-12: 7 ug/min via INTRAVENOUS
  Filled 2024-09-10 (×2): qty 250

## 2024-09-10 MED ORDER — INSULIN ASPART 100 UNIT/ML IJ SOLN
0.0000 [IU] | Freq: Three times a day (TID) | INTRAMUSCULAR | Status: DC
  Administered 2024-09-10: 1 [IU] via SUBCUTANEOUS
  Administered 2024-09-11 – 2024-09-12 (×5): 2 [IU] via SUBCUTANEOUS
  Administered 2024-09-12 – 2024-09-13 (×3): 1 [IU] via SUBCUTANEOUS
  Administered 2024-09-14 – 2024-09-15 (×3): 2 [IU] via SUBCUTANEOUS
  Administered 2024-09-16: 1 [IU] via SUBCUTANEOUS

## 2024-09-10 MED ORDER — VANCOMYCIN HCL 1250 MG/250ML IV SOLN: 1250.0000 mg | INTRAVENOUS | Status: DC

## 2024-09-10 MED ORDER — LACTATED RINGERS IV BOLUS
500.0000 mL | Freq: Once | INTRAVENOUS | Status: AC
  Administered 2024-09-10: 500 mL via INTRAVENOUS

## 2024-09-10 MED ORDER — SODIUM CHLORIDE 0.9 % IV SOLN: 1.0000 g | INTRAVENOUS | Status: DC

## 2024-09-10 MED ORDER — CHLORHEXIDINE GLUCONATE CLOTH 2 % EX PADS
6.0000 | MEDICATED_PAD | Freq: Every day | CUTANEOUS | Status: DC
  Administered 2024-09-10 – 2024-09-18 (×9): 6 via TOPICAL

## 2024-09-10 MED ORDER — SODIUM CHLORIDE 0.9 % IV SOLN
2.0000 g | INTRAVENOUS | Status: DC
  Administered 2024-09-10: 2 g via INTRAVENOUS
  Filled 2024-09-10: qty 12.5

## 2024-09-10 MED ORDER — LACTATED RINGERS IV BOLUS
1000.0000 mL | Freq: Once | INTRAVENOUS | Status: AC
  Administered 2024-09-10: 1000 mL via INTRAVENOUS

## 2024-09-10 MED ORDER — POTASSIUM CHLORIDE CRYS ER 20 MEQ PO TBCR
40.0000 meq | EXTENDED_RELEASE_TABLET | Freq: Once | ORAL | Status: AC
Start: 2024-09-10 — End: 2024-09-10
  Administered 2024-09-10: 40 meq via ORAL
  Filled 2024-09-10: qty 2

## 2024-09-10 MED ORDER — CLONAZEPAM 0.5 MG PO TABS
0.5000 mg | ORAL_TABLET | Freq: Three times a day (TID) | ORAL | Status: DC | PRN
  Administered 2024-09-10 – 2024-09-18 (×8): 0.5 mg via ORAL
  Filled 2024-09-10 (×8): qty 1

## 2024-09-10 MED ORDER — SODIUM CHLORIDE 0.9 % IV SOLN: 2.0000 g | Freq: Two times a day (BID) | INTRAVENOUS | Status: DC

## 2024-09-10 MED ORDER — METOPROLOL TARTRATE 25 MG PO TABS
12.5000 mg | ORAL_TABLET | Freq: Two times a day (BID) | ORAL | Status: DC
  Administered 2024-09-10: 12.5 mg via ORAL
  Filled 2024-09-10: qty 1

## 2024-09-10 MED ORDER — SODIUM CHLORIDE 0.9 % IV BOLUS: 1000.0000 mL | Freq: Once | INTRAVENOUS | Status: DC

## 2024-09-10 MED ORDER — MELATONIN 3 MG PO TABS
6.0000 mg | ORAL_TABLET | Freq: Every day | ORAL | Status: DC
  Administered 2024-09-10 – 2024-09-18 (×9): 6 mg via ORAL
  Filled 2024-09-10 (×9): qty 2

## 2024-09-10 MED ORDER — DIVALPROEX SODIUM 125 MG PO CSDR
375.0000 mg | DELAYED_RELEASE_CAPSULE | Freq: Three times a day (TID) | ORAL | Status: DC
  Administered 2024-09-10 – 2024-09-18 (×27): 375 mg via ORAL
  Filled 2024-09-10 (×28): qty 3

## 2024-09-10 MED ORDER — VANCOMYCIN HCL 1750 MG/350ML IV SOLN
1750.0000 mg | Freq: Once | INTRAVENOUS | Status: AC
  Administered 2024-09-10: 1750 mg via INTRAVENOUS
  Filled 2024-09-10: qty 350

## 2024-09-10 MED ORDER — DEXTROSE-SODIUM CHLORIDE 5-0.45 % IV SOLN: INTRAVENOUS | Status: DC

## 2024-09-10 MED ORDER — OXYCODONE HCL 5 MG PO TABS
5.0000 mg | ORAL_TABLET | Freq: Every day | ORAL | Status: DC | PRN
  Administered 2024-09-10 – 2024-09-13 (×5): 5 mg via ORAL
  Filled 2024-09-10 (×5): qty 1

## 2024-09-10 NOTE — H&P (Addendum)
 History and Physical    RIVER MCKERCHER FMW:[id number] DOB: [birth year] DOA: 09/09/2024  PCP: Bluford Carlin LABOR, MD   Patient coming from: LTC    Chief Complaint:  Chief Complaint  Patient presents with   Fall    HPI: History limited due to dementia  Katelyn Ballard is a 79 y.o. female with hx of Dementia, stroke, CAD with hx PCI, Diastolic HF, MR, HTN, HLD, DM type 2, CKD stage III, prior discitis OM at C7-T1 completed Abx, who presented with GLF, reportedly worse mental status compared to baseline. Per ED collateral, recent hx of UTI. Patient is not able to provide any significant history.      Review of Systems:  ROS complete and negative except as marked above   Allergies  Allergen Reactions   Atorvastatin  Other (See Comments)    Myalgias    Penicillins Other (See Comments)    Unknown    Shellfish Allergy Other (See Comments)    Unknown    Prior to Admission medications   Medication Sig Start Date End Date Taking? Authorizing Provider  allopurinol  (ZYLOPRIM ) 300 MG tablet Take 1 tablet (300 mg total) by mouth daily. 08/24/21   Gladis Mustard, FNP  aspirin 81 MG chewable tablet Chew 81 mg by mouth in the morning.    [provider]  blood glucose meter kit and supplies Check blood sugar bid and as needed Dx: E11.42 03/11/20   Gladis Mustard, FNP  blood glucose meter kit and supplies Dispense based on patient and insurance preference. Use up to four times daily as directed. (FOR ICD-10 E10.9, E11.9). 04/18/21   Gladis, Mary-Margaret, FNP  cefTRIAXone  (ROCEPHIN ) 1 g injection Inject 1 g into the muscle once.    [provider]  cetirizine (ZYRTEC) 10 MG tablet Take 10 mg by mouth daily.    [provider]  Cholecalciferol 50 MCG (2000 UT) CAPS Take 2,000 Units by mouth daily.    [provider]  clonazePAM  (KLONOPIN ) 0.5 MG tablet Take 1 tablet (0.5 mg total) by mouth in the morning, at noon, and at bedtime. 09/26/23   Gherghe,  Costin M, MD  clopidogrel  (PLAVIX ) 75 MG tablet Take 75 mg by mouth in the morning.    [provider]  divalproex  (DEPAKOTE  SPRINKLE) 125 MG capsule Take 375 mg by mouth 3 (three) times daily. 07/24/23   [provider]  escitalopram  (LEXAPRO ) 10 MG tablet Take 10 mg by mouth daily. 07/24/23   [provider]  furosemide  (LASIX ) 20 MG tablet Take 20 mg by mouth daily. 07/24/23   [provider]  glucose blood (ONETOUCH ULTRA) test strip USE 1 STRIP TO CHECK GLUCOSE 2 TO 3 TIMES DAILY 03/30/21   Gladis, Mary-Margaret, FNP  melatonin 3 MG TABS tablet Take 3 mg by mouth at bedtime.    [provider]  methocarbamol (ROBAXIN) 500 MG tablet Take 500 mg by mouth every evening. 07/24/23   [provider]  metoprolol  tartrate (LOPRESSOR ) 25 MG tablet Take 12.5 mg by mouth 2 (two) times daily. 01/09/22   [provider]  mirtazapine  (REMERON ) 15 MG tablet Take 15 mg by mouth in the morning and at bedtime. 07/24/23   [provider]  nitrofurantoin, macrocrystal-monohydrate, (MACROBID) 100 MG capsule Take 100 mg by mouth 2 (two) times daily.    [provider]  oxybutynin (DITROPAN) 5 MG tablet Take 5 mg by mouth at bedtime. 02/13/22   [provider]  rosuvastatin  (CRESTOR ) 40 MG tablet  Take 40 mg by mouth every evening. 07/24/23   [provider]  sennosides-docusate sodium (SENOKOT-S) 8.6-50 MG tablet Take 2 tablets by mouth in the morning.    [provider]  TRULICITY 0.75 MG/0.5ML SOPN Inject 0.75 mg into the skin once a week. Saturdays. 08/31/23   [provider]    Past Medical History:  Diagnosis Date   Ankle fracture, right    1990's   Diabetes mellitus without complication (HCC)    Gout 2006   Hyperlipidemia    Hypertension    Osteopenia     Past Surgical History:  Procedure Laterality Date   ABDOMINAL HYSTERECTOMY       reports that she quit smoking about 31 years ago. Her  smoking use included cigarettes. She has never used smokeless tobacco. She reports that she does not drink alcohol and does not use drugs.  Family History  Problem Relation Age of Onset   Diabetes Mother    Heart disease Mother    Kidney disease Mother        blockage of renal artery   Dementia Mother    Heart disease Father    Heart disease Sister    Diabetes Sister    Diabetes Sister      Physical Exam: Vitals:   09/09/24 2347 09/10/24 0015 09/10/24 0300 09/10/24 0358  BP: 127/66 (!) 108/54 126/65 103/84  Pulse: (!) 58 62 75 68  Resp: 18 17 19 20   Temp:   99.1 F (37.3 C) 98.1 F (36.7 C)  TempSrc:   Oral Oral  SpO2: 95% 95% 96%   Weight:      Height:        Gen: Awake, alert, chronically ill  HEENT: R periorbital ecchymosis  CV: Regular, normal S1, S2, no murmurs  Resp: Normal WOB, CTAB  Abd: Flat, + ventral hernia, reducible. normoactive, nontender MSK: Symmetric, no edema  Skin: No rashes or lesions to exposed skin  Neuro: Alert and interactive, + echolalia, laying on R side, moving all extremities.  Psych: euthymic, appropriate    Data review:   Labs reviewed, notable for:   Cr 1.7; b/l 0.9 - 1.1  WBC 9  PLT 108  UA equivocal (from I/o cath) + 11-20 sq cells; but many bacteria, + leuk / nitrite, > 50 WBC. Suspect underlying infection   Micro:  Results for orders placed or performed during the hospital encounter of 09/23/23  Urine Culture     Status: Abnormal   Collection Time: 09/23/23  1:22 PM   Specimen: Urine, Clean Catch  Result Value Ref Range Status   Specimen Description   Final    URINE, CLEAN CATCH Performed at [facility], [address] [place], [place] [address]    Special Requests   Final    NONE Performed at [facility], [address], [place], [place] [address]    Culture (A)  Final    30,000 COLONIES/mL KLEBSIELLA PNEUMONIAE Two isolates with different morphologies were identified as the same organism.The most resistant  organism was reported. Performed at [facility], [address], [place], [place] [address]    Report Status 09/26/2023 FINAL  Final   Organism ID, Bacteria KLEBSIELLA PNEUMONIAE (A)  Final      Susceptibility   Klebsiella pneumoniae - MIC*    AMPICILLIN >=32 RESISTANT Resistant     CEFAZOLIN <=4 SENSITIVE Sensitive     CEFEPIME  <=0.12 SENSITIVE Sensitive     CEFTRIAXONE  <=0.25 SENSITIVE Sensitive  CIPROFLOXACIN <=0.25 SENSITIVE Sensitive     GENTAMICIN <=1 SENSITIVE Sensitive     IMIPENEM <=0.25 SENSITIVE Sensitive     NITROFURANTOIN 64 INTERMEDIATE Intermediate     TRIMETH/SULFA <=20 SENSITIVE Sensitive     AMPICILLIN/SULBACTAM 4 SENSITIVE Sensitive     PIP/TAZO <=4 SENSITIVE Sensitive     * 30,000 COLONIES/mL KLEBSIELLA PNEUMONIAE    Imaging reviewed:  CT Head Wo Contrast Result Date: 09/09/2024 CLINICAL DATA:  Fall with neck pain hematoma to right eye and forehead EXAM: CT HEAD WITHOUT CONTRAST CT MAXILLOFACIAL WITHOUT CONTRAST CT CERVICAL SPINE WITHOUT CONTRAST TECHNIQUE: Multidetector CT imaging of the head, cervical spine, and maxillofacial structures were performed using the standard protocol without intravenous contrast. Multiplanar CT image reconstructions of the cervical spine and maxillofacial structures were also generated. RADIATION DOSE REDUCTION: This exam was performed according to the departmental dose-optimization program which includes automated exposure control, adjustment of the mA and/or kV according to patient size and/or use of iterative reconstruction technique. COMPARISON:  MRI 12/10/2023, CT brain and cervical spine 09/23/2023 FINDINGS: CT HEAD FINDINGS Brain: No acute territorial infarction, hemorrhage or intracranial mass. Advanced atrophy. Moderate severe chronic small vessel ischemic changes of the white matter. The ventricles are enlarged but stable in size, presumably due to atrophy. Vascular: No hyperdense vessels. Vertebral and carotid vascular  calcification Skull: Normal. Negative for fracture or focal lesion. Other: None CT MAXILLOFACIAL FINDINGS Osseous: Slightly limited by motion degradation. Mastoid air cells are clear. No definitive mandibular fracture. Pterygoid plates and zygomatic arches are intact. No acute nasal bone fracture Orbits: Negative. No traumatic or inflammatory finding. Sinuses: Clear. Soft tissues: Negative. CT CERVICAL SPINE FINDINGS Alignment: No subluxation.  Facet alignment is normal Skull base and vertebrae: No acute fracture. Vertebral body heights are grossly maintained Soft tissues and spinal canal: No prevertebral fluid or swelling. No visible canal hematoma. Disc levels: Advanced multilevel degenerative changes C3 through C7 with multilevel disc space narrowing, osteophyte and partial ankylosis. Irregular endplate erosive changes at C7-T1 with interval sclerosis, consistent with sequela of known osteomyelitis discitis. Multilevel facet degenerative change and ankylosis. Multilevel foraminal narrowing. Upper chest: Lung apices are clear.  Emphysema Other: None IMPRESSION: 1. No CT evidence for acute intracranial abnormality. Atrophy and chronic small vessel ischemic changes of the white matter. 2. No acute facial bone fracture allowing for motion degradation. 3. No acute osseous abnormality of the cervical spine. 4. 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 5. Emphysema Electronically Signed   By: Luke Bun M.D.   On: 09/09/2024 19:45   CT Maxillofacial Wo Contrast Result Date: 09/09/2024 CLINICAL DATA:  Fall with neck pain hematoma to right eye and forehead EXAM: CT HEAD WITHOUT CONTRAST CT MAXILLOFACIAL WITHOUT CONTRAST CT CERVICAL SPINE WITHOUT CONTRAST TECHNIQUE: Multidetector CT imaging of the head, cervical spine, and maxillofacial structures were performed using the standard protocol without intravenous contrast. Multiplanar CT  image reconstructions of the cervical spine and maxillofacial structures were also generated. RADIATION DOSE REDUCTION: This exam was performed according to the departmental dose-optimization program which includes automated exposure control, adjustment of the mA and/or kV according to patient size and/or use of iterative reconstruction technique. COMPARISON:  MRI 12/10/2023, CT brain and cervical spine 09/23/2023 FINDINGS: CT HEAD FINDINGS Brain: No acute territorial infarction, hemorrhage or intracranial mass. Advanced atrophy. Moderate severe chronic small vessel ischemic changes of the white matter. The ventricles are enlarged but stable in size, presumably due to atrophy. Vascular:  No hyperdense vessels. Vertebral and carotid vascular calcification Skull: Normal. Negative for fracture or focal lesion. Other: None CT MAXILLOFACIAL FINDINGS Osseous: Slightly limited by motion degradation. Mastoid air cells are clear. No definitive mandibular fracture. Pterygoid plates and zygomatic arches are intact. No acute nasal bone fracture Orbits: Negative. No traumatic or inflammatory finding. Sinuses: Clear. Soft tissues: Negative. CT CERVICAL SPINE FINDINGS Alignment: No subluxation.  Facet alignment is normal Skull base and vertebrae: No acute fracture. Vertebral body heights are grossly maintained Soft tissues and spinal canal: No prevertebral fluid or swelling. No visible canal hematoma. Disc levels: Advanced multilevel degenerative changes C3 through C7 with multilevel disc space narrowing, osteophyte and partial ankylosis. Irregular endplate erosive changes at C7-T1 with interval sclerosis, consistent with sequela of known osteomyelitis discitis. Multilevel facet degenerative change and ankylosis. Multilevel foraminal narrowing. Upper chest: Lung apices are clear.  Emphysema Other: None IMPRESSION: 1. No CT evidence for acute intracranial abnormality. Atrophy and chronic small vessel ischemic changes of the white  matter. 2. No acute facial bone fracture allowing for motion degradation. 3. No acute osseous abnormality of the cervical spine. 4. 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 5. Emphysema Electronically Signed   By: Luke Bun M.D.   On: 09/09/2024 19:45   CT Cervical Spine Wo Contrast Result Date: 09/09/2024 CLINICAL DATA:  Fall with neck pain hematoma to right eye and forehead EXAM: CT HEAD WITHOUT CONTRAST CT MAXILLOFACIAL WITHOUT CONTRAST CT CERVICAL SPINE WITHOUT CONTRAST TECHNIQUE: Multidetector CT imaging of the head, cervical spine, and maxillofacial structures were performed using the standard protocol without intravenous contrast. Multiplanar CT image reconstructions of the cervical spine and maxillofacial structures were also generated. RADIATION DOSE REDUCTION: This exam was performed according to the departmental dose-optimization program which includes automated exposure control, adjustment of the mA and/or kV according to patient size and/or use of iterative reconstruction technique. COMPARISON:  MRI 12/10/2023, CT brain and cervical spine 09/23/2023 FINDINGS: CT HEAD FINDINGS Brain: No acute territorial infarction, hemorrhage or intracranial mass. Advanced atrophy. Moderate severe chronic small vessel ischemic changes of the white matter. The ventricles are enlarged but stable in size, presumably due to atrophy. Vascular: No hyperdense vessels. Vertebral and carotid vascular calcification Skull: Normal. Negative for fracture or focal lesion. Other: None CT MAXILLOFACIAL FINDINGS Osseous: Slightly limited by motion degradation. Mastoid air cells are clear. No definitive mandibular fracture. Pterygoid plates and zygomatic arches are intact. No acute nasal bone fracture Orbits: Negative. No traumatic or inflammatory finding. Sinuses: Clear. Soft tissues: Negative. CT CERVICAL SPINE FINDINGS Alignment: No subluxation.   Facet alignment is normal Skull base and vertebrae: No acute fracture. Vertebral body heights are grossly maintained Soft tissues and spinal canal: No prevertebral fluid or swelling. No visible canal hematoma. Disc levels: Advanced multilevel degenerative changes C3 through C7 with multilevel disc space narrowing, osteophyte and partial ankylosis. Irregular endplate erosive changes at C7-T1 with interval sclerosis, consistent with sequela of known osteomyelitis discitis. Multilevel facet degenerative change and ankylosis. Multilevel foraminal narrowing. Upper chest: Lung apices are clear.  Emphysema Other: None IMPRESSION: 1. No CT evidence for acute intracranial abnormality. Atrophy and chronic small vessel ischemic changes of the white matter. 2. No acute facial bone fracture allowing for motion degradation. 3. No acute osseous abnormality of the cervical spine. 4. 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 5. Emphysema Electronically Signed   By: Luke  Scott M.D.   On: 09/09/2024 19:45    EKG:  SR with slight ST dep anterolaterally. Qtc prolonged 604 per automated, likely overestimated   ED Course:   Treated with 500 cc NS, CTX 2g IV.    Assessment/Plan:  79 y.o. female with hx Dementia, stroke, CAD with hx PCI, Diastolic HF, MR, HTN, HLD, DM type 2, CKD stage III, prior discitis OM at C7-T1 completed Abx, who presented with GLF, reportedly worse mental status compared to baseline. Found to have borderline AKI and likely UTI   GLF  Acute on chronic encephalopathy  Suspect worsening mental status in setting of likely UTI, dehydration. Imaging including CT Head, C spine, Maxillofacial without acute abnormalities.  -- Management of UTI / Dehydration per below  -- Delirium precautions, fall precautions  -- Would use Ativan 1st line for agitation in setting of prolonged Qtc -- PT/OT evaluation  Borderline Acute  kidney injury, stage I  CKD stage III  Dehydration   Baseline cr appears to be 1.3 - 1.4, up to 1.7 on admission. Likely prerenal from poor intake with dementia.  -- S/p 500 cc IVF in the ED, give additional 1 L then continue oral hydration.  -- Check PVR  -- Trend renal function   Suspect UTI  UA equivocal (from I/o cath) + 11-20 sq cells; but many bacteria, + leuk / nitrite, > 50 WBC. Suspect underlying infection.  -- Continue CTX 1 g IV q 24 hr, f/u urine culture   Prolonged Qtc  604 per automated, likely overestimated with likely u wave blending with T. -- Pause escitalopram , reduce Mirtazapine  to nightly -- Replete K, check Mg.  -- Repeat EKG for QT monitoring tomorrow   Thrombocytopenia -- Mild, continue to follow   Chronic medical problems: -> No med list from [facility] that I can find. Called but no answer from facility. Ordered based on most recent fill history. Please followup with facility / pharmacy for med rec later this morning  Dementia: Noted, use Benzodiazepine prn for agitation d/t prolonged Qtc.  CVA: Continue home plavix ; unclear if still on DAPT held off on aspirin for now , reduce Rosuvastatin  to 10 mg I/s/o renal injury.  CAD with PCI: See CVA  Diastolic HF: Hold home lasix  I/s/o volume depletion.  MR: OP surveillance  HTN: Continue home Metoprolol   HLD: See CVA  DM type 2: On trulicity OP, would temporarily discontinue at discharge due to suspected poor intake with her dementia, allow for more permissive glycemic target. Not requiring SSI at present  ? OAB: hold home oxybutynin  Mood d/o: Continue home Clonazepam  0.5 mg TID prn. Hold escitalopram , + reduce Mirtazapine  to bedtime with prolonged QTc, continue home Valproate 375 mg TID.  Chronic pain: On Oxycodone  5 mg daily prn, methcarbamol TID at home, use only prn with fall risk  Gout: Continue home allopurinol   Hx discitis / OM C7-T1: Completed Abx, changes of prior OM/discitis on imaging with no acute  findings.   Body mass index is 27.45 kg/m.    DVT prophylaxis:  SQ Heparin  Code Status:  Full Code; Presumed full for now. Previous order for DNR/DNI. No DDNR on file, no paperwork from facility. Will need to verify  Diet:  Diet Orders (From admission, onward)    None      Family Communication:  None . Attempted to contact her nursing facility, no answer.  Consults:  None   Admission status:   Observation, Med-Surg  Severity of Illness: The appropriate  patient status for this patient is OBSERVATION. Observation status is judged to be reasonable and necessary in order to provide the required intensity of service to ensure the patient's safety. The patient's presenting symptoms, physical exam findings, and initial radiographic and laboratory data in the context of their medical condition is felt to place them at decreased risk for further clinical deterioration. Furthermore, it is anticipated that the patient will be medically stable for discharge from the hospital within 2 midnights of admission.    Dorn Dawson, MD [facility]  How to contact the [facility] Attending or Consulting provider 7A - 7P or covering provider during after hours 7P -7A, for this patient.  Check the care team in [facility] and look for a) attending/consulting [facility] provider listed and b) the [facility] team listed Log into [email or web address] and use 's universal password to access. If you do not have the password, please contact the hospital operator. Locate the [facility] provider you are looking for under [facility] and page to a number that you can be directly reached. If you still have difficulty reaching the provider, please page the [facility] (Director on Call) for the Hospitalists listed on amion for assistance.  09/10/2024, 4:02 AM

## 2024-09-10 NOTE — TOC Initial Note (Signed)
 Transition of Care [facility] - Initial/Assessment Note    Patient Details  Name: Katelyn Ballard MRN: [id number] Date of Birth: 04-Sep-1945  [facility] CM/SW Contact:    Noreen KATHEE Cleotilde ISRAEL Phone Number: 09/10/2024, 11:47 AM  Clinical Narrative:                   CSW spoke with patient daughter Luke who shared that she takes care of everything. Daughter reported that patient has been at [facility] for 2 years and staff assist with all her care. Lang Beagle shared that patient can return back once medically stable and no shara is needed.  CSW shared with daughter that once patient is ready for DC she will get a call about transportation back to facility. ICM will continue to follow.   Expected Discharge Plan: Skilled Nursing Facility Barriers to Discharge: Continued Medical Work up   Patient Goals and CMS Choice Patient states their goals for this hospitalization and ongoing recovery are:: return back to [facility] [email or web address] Compare Post Acute Care list provided to:: Patient Represenative (must comment) (Daughter Luke) Choice offered to / list presented to : Adult Children      Expected Discharge Plan and Services     Post Acute Care Choice: Durable Medical Equipment, Skilled Nursing Facility Living arrangements for the past 2 months: Skilled Nursing Facility                                      Prior Living Arrangements/Services Living arrangements for the past 2 months: Skilled Nursing Facility Lives with:: Facility Resident Patient language and need for interpreter reviewed:: Yes Do you feel safe going back to the place where you live?: Yes      Need for Family Participation in Patient Care: Yes (Comment) Care giver support system in place?: Yes (comment)   Criminal Activity/Legal Involvement Pertinent to Current Situation/Hospitalization: No - Comment as needed  Activities of Daily Living      Permission Sought/Granted      Share  Information with NAME: Luke     Permission granted to share info w Relationship: Daughter     Emotional Assessment Appearance:: Appears stated age       Alcohol / Substance Use: Not Applicable Psych Involvement: No (comment)  Admission diagnosis:  Elevated serum creatinine [R79.89] Acute cystitis without hematuria [N30.00] Acute kidney injury (HCC) [N17.9] Injury of head, initial encounter [S09.90XA] Fall, initial encounter [W19.XXXA] Patient Active Problem List   Diagnosis Date Noted   Acute kidney injury (HCC) 09/10/2024   Ground-level fall 09/10/2024   Urinary tract infection 09/10/2024   Multilevel degenerative disc disease 09/25/2023   Osteomyelitis (HCC) 09/23/2023   Hypokalemia 09/23/2023   Sundowning 09/23/2023   Acute cerebrovascular accident (CVA) due to occlusion of left posterior cerebral artery (HCC) 02/05/2022   Broca's aphasia 02/05/2022   TIA (transient ischemic attack) 02/05/2022   Acute diastolic heart failure (HCC) 02/04/2022   Encephalopathy chronic 02/04/2022   History of COVID-19 02/04/2022   History of non-ST elevation myocardial infarction (NSTEMI) 02/04/2022   Occipital stroke (HCC) 02/04/2022   Anemia 01/03/2022   History of pulmonary embolism 01/03/2022   Former smoker 02/04/2021   Type 2 diabetes mellitus without complication, without long-term current use of insulin  (HCC) 02/04/2021   Trigger finger, left middle finger 02/26/2019   BMI 33.0-33.9,adult 10/04/2015   Vitamin D  deficiency 04/08/2015   Diabetes (  HCC) 05/03/2014   Hypertension 10/22/2013   Hyperlipidemia with target LDL less than 100 10/22/2013   Gout 10/22/2013   PCP:  Bluford Carlin LABOR, MD Pharmacy:   [facility] [address] [place] [phone] [place] [address] Phone: [phone] Fax: [phone]  [facility] [place], [place] - [address] [place] [address] Phone: [phone] Fax:  [phone]     Social Drivers of Health (SDOH) Social History: SDOH Screenings   Food Insecurity: Patient Unable To Answer (09/10/2024)  Housing: Patient Unable To Answer (09/10/2024)  Transportation Needs: Patient Unable To Answer (09/10/2024)  Utilities: Patient Unable To Answer (09/10/2024)  Depression (PHQ2-9): Low Risk  (12/26/2023)  Recent Concern: Depression (PHQ2-9) - Medium Risk (12/11/2023)  Financial Resource Strain: Low Risk  (08/06/2024)   Received from [facility]  Physical Activity: Insufficiently Active (02/05/2022)  Social Connections: Patient Unable To Answer (09/10/2024)  Stress: No Stress Concern Present (02/05/2022)   [facility]  Recent Concern: Stress - Stress Concern Present (02/05/2022)  Tobacco Use: Medium Risk (09/09/2024)   SDOH Interventions:     Readmission Risk Interventions     No data to display

## 2024-09-10 NOTE — Significant Event (Signed)
 Update: Developed recurrent hypotension with systolic BP in 70s. On exam she is hot and flushed distally, no CVAT, she awakens and verbalizes but not able to provide meaningful history. Lactate is elevated, 4.9 -> 3.8 currently. She has received 3.5 L of IVF since midnight, although majority was > 12 hours ago. Her micro review shows remote hx Kleb pneumoniae UTI which was Amp R. Concern for possible resistant organism or uncontrolled source.   Septic shock, worsening  -- Start Levophed  gtt peripherally  -- Give LR 500 cc  -- Escalate Abx to Aztreonam for ESBL covg (PCN allergy reported hives; although has tolerated cephalosporin) and Vancomycin  to cover for resistant enterococcal sp.  -- Mitts to protect lines, wrist restraint if needs  -- CT A/P without contrast d/t AKI eval for intraabd infection / stone  -- Called and updated daughter Luke over the phone re: worsening clinical status and critical illness, initiation of pressor, and we will keep her updated on her trajectory tonight.   Update: D/w pharmacy, think should be OK with meropenem  with low cross reactivity with PCN allergy. Will use this instead for better ESBL coverage   Dorn Dawson, MD  [facility]

## 2024-09-10 NOTE — ED Notes (Signed)
 pt with severe dementia- mits placed on pt in attempt to protect IV, pt has pulled all monitoring chords off

## 2024-09-10 NOTE — Hospital Course (Addendum)
 79 y.o. female with a history of dementia, stroke CAD s/p PCI, diastolic heart failure, mitral valve regurgitation, hypertension, hyperlipidemia, diabetes mellitus type 2, CKD stage III.  Patient presented secondary to fall with altered mental status and concern for metabolic encephalopathy secondary to UTI. Empiric antibiotics started. During hospitalization, patient developed worsening hypotension, consistent with severe sepsis, and was transferred to stepdown/ICU. Goals of care discussions with palliative medicine team with expressed understanding of not escalating care should patient decompensate.  The Plan is continuing to treat the treatable and optimizing patient until she can be discharged to SNF where hospice/palliative will follow.

## 2024-09-10 NOTE — Progress Notes (Signed)
 OT Cancellation Note  Patient Details Name: Katelyn Ballard MRN: [id number] DOB: 11/13/1945   Cancelled Treatment:    Reason Eval/Treat Not Completed: OT screened, no needs identified, will sign off. Long term care. Assisted at baseline and not in need of therapy services according to social work. Pt will be removed from the OT list.   JAYSON PERSON OT, MOT   JAYSON PERSON 09/10/2024, 10:50 AM

## 2024-09-10 NOTE — Progress Notes (Signed)
 Date and time results received: 09/10/24 12:22 PM (use smartphrase .now to insert current time)  Test: Lactic  Critical Value: 3.2  Name of Provider Notified: Dr.Nettey  Orders Received? Or Actions Taken?: await new orders

## 2024-09-10 NOTE — Progress Notes (Signed)
 PT Cancellation Note  Patient Details Name: Katelyn Ballard MRN: [id number] DOB: [birth year]   Cancelled Treatment:    Reason Eval/Treat Not Completed: PT screened, no needs identified, will sign off.  Patient at baseline, non-ambulatory and uses mechanical lift for transfers.    2:06 PM, 09/10/24 Lynwood Music, MPT Physical Therapist with [facility] [phone] office [phone] mobile phone

## 2024-09-10 NOTE — ED Notes (Signed)
 Pt daughter just left, pt has bed alarm on bed, pt resting at this time with eyes closed- lights dimmed

## 2024-09-10 NOTE — Progress Notes (Signed)
 Pharmacy Antibiotic Note  Katelyn Ballard is a 79 y.o. female admitted on 09/09/2024 with fall now developing recurrent hypotension/septic.  Pharmacy has been consulted for vancomycin  dosing.  -Blood/urine cultures collected -WBC WNL, lactate elevated, afebrile -sCr 1.55 (bl~1.1) -CT: Irregular disc space disease at C7-T1 corresponding to known history of discitis osteomyelitis (completed course of dalbavancin)  -2024 Ucx: 30k Kleb Pneumo (R-amp)  Plan: -Aztreonam 2g IV every 12 hours -Vancomycin  1750mg  IV x1 -Vancomycin  1250mg  IV every 48 hours (AUC 508, Vd 0.72, IBW, sCr 1.55) -Monitor renal function for dose adjustments -Follow up signs of clinical improvement, LOT, de-escalation of antibiotics   Height: 5' 3 (160 cm) Weight: 70.3 kg (154 lb 15.7 oz) IBW/kg (Calculated) : 52.4  Temp (24hrs), Avg:98.7 F (37.1 C), Min:98.1 F (36.7 C), Max:99.6 F (37.6 C)  Recent Labs  Lab 09/09/24 1932 09/10/24 0525 09/10/24 1133 09/10/24 1308 09/10/24 1534 09/10/24 2016  WBC 9.2 9.4  --   --   --   --   CREATININE 1.71* 1.55*  --   --   --   --   LATICACIDVEN  --   --  3.2* 3.3* 4.9* 3.8*    Estimated Creatinine Clearance: 27.2 mL/min (A) (by C-G formula based on SCr of 1.55 mg/dL (H)).    Allergies  Allergen Reactions   Atorvastatin  Other (See Comments)    Myalgias    Penicillins Other (See Comments)    Unknown    Shellfish Allergy Other (See Comments)    Unknown    Antimicrobials this admission: Cefriaxone, cefepime  x1  Aztreonam 9/12 >>  Vancomycin  9/12 >>   Microbiology results: 9/11 BCx:  9/11 MRSA PCR: negative 9/10 UCx:   Thank you for allowing pharmacy to be a part of this patient's care.  Lynwood Poplar, PharmD, BCPS Clinical Pharmacist 09/10/2024 10:58 PM

## 2024-09-10 NOTE — Progress Notes (Addendum)
 PROGRESS NOTE    Katelyn Ballard  FMW:[id number] DOB: 10-May-1945 DOA: 09/09/2024 PCP: Bluford Carlin LABOR, MD   Brief Narrative: Katelyn Ballard is a 79 y.o. female with a history of dementia, stroke CAD s/p PCI, diastolic heart failure, mitral valve regurgitation, hypertension, hyperlipidemia, diabetes mellitus type 2, CKD stage III.  Patient presented secondary to fall with altered mental status and concern for metabolic encephalopathy secondary to UTI. Empiric antibiotics started. During hospitalization, patient developed worsening hypotension, consistent with severe sepsis, and was transferred to stepdown/ICU. Goals of care discussions ongoing.   Assessment and Plan:  Acute encephalopathy Likely metabolic and presumed secondary to infection and dehydration. CT head without acute process identified. Complicated by baseline dementia. Possible delirium could be associated. -Delirium precautions   AKI on CKD stage IIIa Baseline creatinine appears to be around 1.1. Creatinine of 1.71 on admission. Secondary to dehydration. Some improvement after IV fluids -Continue IV fluids   Hypernatremia Likely related to poor oral intake and high sodium IV fluids. -D5 1/2 NS IV fluids   Severe sepsis Not present on admission. Secondary to dehydration. Lactic acid of 3.2. 2 liters of lactated ringers  given with repeat lactic acid slightly elevated at 3.4, however second bolus had not completed. Procalcitonin obtained and is elevated at 6.21. -Continue IV fluids -Repeat lactic acid -Transfer to Stepdown/ICU -Will consider starting vasopressors if blood pressure remains low/lactic acid worsening -Change antibiotics -Obtain blood cultures (post-antibiotics) -Repeat procalcitonin in AM   Sinus bradycardia Noted on preadmission EKG. Discontinue metoprolol .   Possible UTI Urinalysis suggests likely infection. Urine culture obtained and pending. Patient started empirically on Ceftriaxone   IV -Discontinue Ceftriaxone  and start Cefepime    Prolonged QTc Noted on EKG prior to admission. Repeat EKG shows resolution.   Thrombocytopenia Appears to possibly be acute, although last platelet count from 8 months prior to admission. Possibly reactive in setting of acute illness. Platelets of 108,000 on admission. -CBC in AM   Dementia -Delirium precautions   CVA Noted -Continue Crestor   CAD Noted. No chest pain -Continue Crestor   Chronic diastolic heart failure Noted. Currently no evidence of fluid overload. Hypotensive in setting of sepsis.  Mitral regurgitation Noted.  Hypertension Noted. Patient is on metoprolol  which was continued on admission. Discontinued secondary to bradycardia and hypotension.  Hyperlipidemia -Continue Crestor   Diabetes mellitus type 2 Well controlled based on hemoglobin A1C of 6.4% from 2024. Patient is on Trulicity as an outpatient. -SSI sensitive scale -Check hemoglobin A1C  Overactive bladder Noted. Oxybutynin held on admission.  Anxiety Noted. Lexapro  held on admission. Clonazepam  resumed. -Continue Clonazepam  as needed  Gout -Continue allopurinol   Chronic pain Noted. Patient without evidence of pain at this time. With mental status change, scheduled home narcotics held on admission. -Continue oxycodone  as needed  Addendum: Goals of care Long in-person discussion with daughter and son. Discussed current clinical course of patient. Discussed prior trajectory of patient as it relates to recurrent UTI and overall decline in quality of life. Agreeable at this time that patient should not be resuscitated should she die. Also agreeable that patient should not be intubated if she were to develop respiratory failure. At this time, okay with transition to DNR-limited, but will continue care in the ICU. Family open to discussing further with palliative care. We touched on comfort measures/hospice care. At this time, family will continue  to consider this course of action. Per daughter, patient has expressed multiple times that she is ready to die. Daughter also shared that patient has  mentioned, with increasing frequency, family members that have already died. Will place a palliative care consul, however will continue current medical course.   DVT prophylaxis: Subcutaneous heparin  Code Status:   Code Status: Full Code Family Communication: Daughter on telephone Disposition Plan: Transfer to stepdown   Consultants:  None  Procedures:  None  Antimicrobials: Ceftriaxone  Cefepime     Subjective: Patient is not coherent in setting of underlying dementia.   Objective: BP (!) 81/67   Pulse 82   Temp 99.6 F (37.6 C)   Resp 20   Ht 5' 3 (1.6 m)   Wt 70.3 kg   SpO2 99%   BMI 27.45 kg/m   Examination:  General exam: Appears calm and comfortable Respiratory system: Clear to auscultation. Respiratory effort normal. Cardiovascular system: S1 & S2 heard, RRR. Decreased radial pulses Gastrointestinal system: Abdomen is nondistended, soft and nontender. Normal bowel sounds heard. Central nervous system: Alert and not oriented. Musculoskeletal: No edema. No calf tenderness   Data Reviewed: I have personally reviewed following labs and imaging studies  CBC Lab Results  Component Value Date   WBC 9.4 09/10/2024   RBC 4.16 09/10/2024   HGB 12.3 09/10/2024   HCT 40.2 09/10/2024   MCV 96.6 09/10/2024   MCH 29.6 09/10/2024   PLT 118 (L) 09/10/2024   MCHC 30.6 09/10/2024   RDW 17.0 (H) 09/10/2024   LYMPHSABS 4.2 (H) 09/09/2024   MONOABS 0.7 09/09/2024   EOSABS 0.2 09/09/2024   BASOSABS 0.1 09/09/2024     Last metabolic panel Lab Results  Component Value Date   NA 147 (H) 09/10/2024   K 3.2 (L) 09/10/2024   CL 109 09/10/2024   CO2 24 09/10/2024   BUN 33 (H) 09/10/2024   CREATININE 1.55 (H) 09/10/2024   GLUCOSE 82 09/10/2024   GFRNONAA 34 (L) 09/10/2024   GFRAA 60 12/05/2020   CALCIUM  8.4 (L)  09/10/2024   PHOS 2.9 09/10/2024   PROT 6.6 09/09/2024   ALBUMIN 2.6 (L) 09/09/2024   LABGLOB 3.3 08/24/2021   AGRATIO 1.2 08/24/2021   BILITOT 0.6 09/09/2024   ALKPHOS 95 09/09/2024   AST 12 (L) 09/09/2024   ALT 12 09/09/2024   ANIONGAP 14 09/10/2024    GFR: Estimated Creatinine Clearance: 27.2 mL/min (A) (by C-G formula based on SCr of 1.55 mg/dL (H)).  No results found for this or any previous visit (from the past 240 hours).    Radiology Studies: CT Head Wo Contrast Result Date: 09/09/2024 CLINICAL DATA:  Fall with neck pain hematoma to right eye and forehead EXAM: CT HEAD WITHOUT CONTRAST CT MAXILLOFACIAL WITHOUT CONTRAST CT CERVICAL SPINE WITHOUT CONTRAST TECHNIQUE: Multidetector CT imaging of the head, cervical spine, and maxillofacial structures were performed using the standard protocol without intravenous contrast. Multiplanar CT image reconstructions of the cervical spine and maxillofacial structures were also generated. RADIATION DOSE REDUCTION: This exam was performed according to the departmental dose-optimization program which includes automated exposure control, adjustment of the mA and/or kV according to patient size and/or use of iterative reconstruction technique. COMPARISON:  MRI 12/10/2023, CT brain and cervical spine 09/23/2023 FINDINGS: CT HEAD FINDINGS Brain: No acute territorial infarction, hemorrhage or intracranial mass. Advanced atrophy. Moderate severe chronic small vessel ischemic changes of the white matter. The ventricles are enlarged but stable in size, presumably due to atrophy. Vascular: No hyperdense vessels. Vertebral and carotid vascular calcification Skull: Normal. Negative for fracture or focal lesion. Other: None CT MAXILLOFACIAL FINDINGS Osseous: Slightly limited by motion degradation. Mastoid air cells  are clear. No definitive mandibular fracture. Pterygoid plates and zygomatic arches are intact. No acute nasal bone fracture Orbits: Negative. No  traumatic or inflammatory finding. Sinuses: Clear. Soft tissues: Negative. CT CERVICAL SPINE FINDINGS Alignment: No subluxation.  Facet alignment is normal Skull base and vertebrae: No acute fracture. Vertebral body heights are grossly maintained Soft tissues and spinal canal: No prevertebral fluid or swelling. No visible canal hematoma. Disc levels: Advanced multilevel degenerative changes C3 through C7 with multilevel disc space narrowing, osteophyte and partial ankylosis. Irregular endplate erosive changes at C7-T1 with interval sclerosis, consistent with sequela of known osteomyelitis discitis. Multilevel facet degenerative change and ankylosis. Multilevel foraminal narrowing. Upper chest: Lung apices are clear.  Emphysema Other: None IMPRESSION: 1. No CT evidence for acute intracranial abnormality. Atrophy and chronic small vessel ischemic changes of the white matter. 2. No acute facial bone fracture allowing for motion degradation. 3. No acute osseous abnormality of the cervical spine. 4. 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 5. Emphysema Electronically Signed   By: Luke Bun M.D.   On: 09/09/2024 19:45   CT Maxillofacial Wo Contrast Result Date: 09/09/2024 CLINICAL DATA:  Fall with neck pain hematoma to right eye and forehead EXAM: CT HEAD WITHOUT CONTRAST CT MAXILLOFACIAL WITHOUT CONTRAST CT CERVICAL SPINE WITHOUT CONTRAST TECHNIQUE: Multidetector CT imaging of the head, cervical spine, and maxillofacial structures were performed using the standard protocol without intravenous contrast. Multiplanar CT image reconstructions of the cervical spine and maxillofacial structures were also generated. RADIATION DOSE REDUCTION: This exam was performed according to the departmental dose-optimization program which includes automated exposure control, adjustment of the mA and/or kV according to patient size and/or use of  iterative reconstruction technique. COMPARISON:  MRI 12/10/2023, CT brain and cervical spine 09/23/2023 FINDINGS: CT HEAD FINDINGS Brain: No acute territorial infarction, hemorrhage or intracranial mass. Advanced atrophy. Moderate severe chronic small vessel ischemic changes of the white matter. The ventricles are enlarged but stable in size, presumably due to atrophy. Vascular: No hyperdense vessels. Vertebral and carotid vascular calcification Skull: Normal. Negative for fracture or focal lesion. Other: None CT MAXILLOFACIAL FINDINGS Osseous: Slightly limited by motion degradation. Mastoid air cells are clear. No definitive mandibular fracture. Pterygoid plates and zygomatic arches are intact. No acute nasal bone fracture Orbits: Negative. No traumatic or inflammatory finding. Sinuses: Clear. Soft tissues: Negative. CT CERVICAL SPINE FINDINGS Alignment: No subluxation.  Facet alignment is normal Skull base and vertebrae: No acute fracture. Vertebral body heights are grossly maintained Soft tissues and spinal canal: No prevertebral fluid or swelling. No visible canal hematoma. Disc levels: Advanced multilevel degenerative changes C3 through C7 with multilevel disc space narrowing, osteophyte and partial ankylosis. Irregular endplate erosive changes at C7-T1 with interval sclerosis, consistent with sequela of known osteomyelitis discitis. Multilevel facet degenerative change and ankylosis. Multilevel foraminal narrowing. Upper chest: Lung apices are clear.  Emphysema Other: None IMPRESSION: 1. No CT evidence for acute intracranial abnormality. Atrophy and chronic small vessel ischemic changes of the white matter. 2. No acute facial bone fracture allowing for motion degradation. 3. No acute osseous abnormality of the cervical spine. 4. 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 5. Emphysema Electronically Signed   By: Luke Bun M.D.    On: 09/09/2024 19:45   CT Cervical Spine Wo Contrast Result Date: 09/09/2024 CLINICAL DATA:  Fall with neck pain hematoma to right eye and forehead  EXAM: CT HEAD WITHOUT CONTRAST CT MAXILLOFACIAL WITHOUT CONTRAST CT CERVICAL SPINE WITHOUT CONTRAST TECHNIQUE: Multidetector CT imaging of the head, cervical spine, and maxillofacial structures were performed using the standard protocol without intravenous contrast. Multiplanar CT image reconstructions of the cervical spine and maxillofacial structures were also generated. RADIATION DOSE REDUCTION: This exam was performed according to the departmental dose-optimization program which includes automated exposure control, adjustment of the mA and/or kV according to patient size and/or use of iterative reconstruction technique. COMPARISON:  MRI 12/10/2023, CT brain and cervical spine 09/23/2023 FINDINGS: CT HEAD FINDINGS Brain: No acute territorial infarction, hemorrhage or intracranial mass. Advanced atrophy. Moderate severe chronic small vessel ischemic changes of the white matter. The ventricles are enlarged but stable in size, presumably due to atrophy. Vascular: No hyperdense vessels. Vertebral and carotid vascular calcification Skull: Normal. Negative for fracture or focal lesion. Other: None CT MAXILLOFACIAL FINDINGS Osseous: Slightly limited by motion degradation. Mastoid air cells are clear. No definitive mandibular fracture. Pterygoid plates and zygomatic arches are intact. No acute nasal bone fracture Orbits: Negative. No traumatic or inflammatory finding. Sinuses: Clear. Soft tissues: Negative. CT CERVICAL SPINE FINDINGS Alignment: No subluxation.  Facet alignment is normal Skull base and vertebrae: No acute fracture. Vertebral body heights are grossly maintained Soft tissues and spinal canal: No prevertebral fluid or swelling. No visible canal hematoma. Disc levels: Advanced multilevel degenerative changes C3 through C7 with multilevel disc space narrowing,  osteophyte and partial ankylosis. Irregular endplate erosive changes at C7-T1 with interval sclerosis, consistent with sequela of known osteomyelitis discitis. Multilevel facet degenerative change and ankylosis. Multilevel foraminal narrowing. Upper chest: Lung apices are clear.  Emphysema Other: None IMPRESSION: 1. No CT evidence for acute intracranial abnormality. Atrophy and chronic small vessel ischemic changes of the white matter. 2. No acute facial bone fracture allowing for motion degradation. 3. No acute osseous abnormality of the cervical spine. 4. 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 5. Emphysema Electronically Signed   By: Luke Bun M.D.   On: 09/09/2024 19:45      LOS: 0 days    Elgin Lam, MD [facility] 09/10/2024, 2:01 PM  .CRITICAL CARE Performed by: Elgin Lam, MD   Total critical care time: 40 minutes  Critical care time was exclusive of separately billable procedures and treating other patients.  Critical care was necessary to treat or prevent imminent or life-threatening deterioration.  Critical care was time spent personally by me on the following activities: development of treatment plan with patient and/or surrogate as well as nursing, discussions with consultants, evaluation of patient's response to treatment, examination of patient, obtaining history from patient or surrogate, ordering and performing treatments and interventions, ordering and review of laboratory studies, ordering and review of radiographic studies, pulse oximetry and re-evaluation of patient's condition.  If 7PM-7AM, please contact night-coverage [email or web address]

## 2024-09-10 NOTE — Progress Notes (Signed)
   09/10/24 0814  Assess: MEWS Score  BP (!) 156/138  MAP (mmHg) 146  Pulse Rate (!) 123  Assess: MEWS Score  MEWS Temp 0  MEWS Systolic 0  MEWS Pulse 2  MEWS RR 0  MEWS LOC 0  MEWS Score 2  MEWS Score Color Yellow  Assess: if the MEWS score is Yellow or Red  Were vital signs accurate and taken at a resting state? Yes  Does the patient meet 2 or more of the SIRS criteria? No  MEWS guidelines implemented  Yes, yellow  Treat  MEWS Interventions Considered administering scheduled or prn medications/treatments as ordered  Take Vital Signs  Increase Vital Sign Frequency  Yellow: Q2hr x1, continue Q4hrs until patient remains green for 12hrs  Escalate  MEWS: Escalate Yellow: Discuss with charge nurse and consider notifying provider and/or RRT  Notify: Charge Nurse/RN  Name of Charge Nurse/RN Notified crystal, RN  Provider Notification  Provider Name/Title Nettey  Date Provider Notified 09/10/24  Time Provider Notified [phone]  Method of Notification Page  Notification Reason Other (Comment)  Assess: SIRS CRITERIA  SIRS Temperature  0  SIRS Respirations  0  SIRS Pulse 1  SIRS WBC 0  SIRS Score Sum  1

## 2024-09-10 NOTE — Progress Notes (Signed)
 Pt arrived from ED, alert to self only.Initial assessment completed, unable to complete admission questions due to pt altered mental status/

## 2024-09-11 ENCOUNTER — Inpatient Hospital Stay (HOSPITAL_COMMUNITY)

## 2024-09-11 DIAGNOSIS — R6521 Severe sepsis with septic shock: Secondary | ICD-10-CM | POA: Diagnosis not present

## 2024-09-11 DIAGNOSIS — Z7189 Other specified counseling: Secondary | ICD-10-CM

## 2024-09-11 DIAGNOSIS — G9349 Other encephalopathy: Secondary | ICD-10-CM

## 2024-09-11 DIAGNOSIS — F02C0 Dementia in other diseases classified elsewhere, severe, without behavioral disturbance, psychotic disturbance, mood disturbance, and anxiety: Secondary | ICD-10-CM

## 2024-09-11 DIAGNOSIS — Z515 Encounter for palliative care: Secondary | ICD-10-CM | POA: Diagnosis not present

## 2024-09-11 DIAGNOSIS — G309 Alzheimer's disease, unspecified: Secondary | ICD-10-CM | POA: Diagnosis not present

## 2024-09-11 DIAGNOSIS — N179 Acute kidney failure, unspecified: Secondary | ICD-10-CM | POA: Diagnosis not present

## 2024-09-11 DIAGNOSIS — A419 Sepsis, unspecified organism: Secondary | ICD-10-CM | POA: Diagnosis not present

## 2024-09-11 DIAGNOSIS — Z558 Other problems related to education and literacy: Secondary | ICD-10-CM | POA: Diagnosis not present

## 2024-09-11 LAB — BASIC METABOLIC PANEL WITH GFR
Anion gap: 8 (ref 5–15)
BUN: 39 mg/dL — ABNORMAL HIGH (ref 8–23)
CO2: 23 mmol/L (ref 22–32)
Calcium: 8 mg/dL — ABNORMAL LOW (ref 8.9–10.3)
Chloride: 108 mmol/L (ref 98–111)
Creatinine, Ser: 1.96 mg/dL — ABNORMAL HIGH (ref 0.44–1.00)
GFR, Estimated: 25 mL/min — ABNORMAL LOW (ref >60)
Glucose, Bld: 151 mg/dL — ABNORMAL HIGH (ref 70–99)
Potassium: 4 mmol/L (ref 3.5–5.1)
Sodium: 139 mmol/L (ref 135–145)

## 2024-09-11 LAB — CBC
HCT: 31.7 % — ABNORMAL LOW (ref 36.0–46.0)
Hemoglobin: 9.9 g/dL — ABNORMAL LOW (ref 12.0–15.0)
MCH: 30 pg (ref 26.0–34.0)
MCHC: 31.2 g/dL (ref 30.0–36.0)
MCV: 96.1 fL (ref 80.0–100.0)
Platelets: 98 K/uL — ABNORMAL LOW (ref 150–400)
RBC: 3.3 MIL/uL — ABNORMAL LOW (ref 3.87–5.11)
RDW: 17 % — ABNORMAL HIGH (ref 11.5–15.5)
WBC: 25 K/uL — ABNORMAL HIGH (ref 4.0–10.5)
nRBC: 0 % (ref 0.0–0.2)

## 2024-09-11 LAB — GLUCOSE, CAPILLARY
Glucose-Capillary: 151 mg/dL — ABNORMAL HIGH (ref 70–99)
Glucose-Capillary: 169 mg/dL — ABNORMAL HIGH (ref 70–99)
Glucose-Capillary: 168 mg/dL — ABNORMAL HIGH (ref 70–99)
Glucose-Capillary: 140 mg/dL — ABNORMAL HIGH (ref 70–99)

## 2024-09-11 LAB — HEMOGLOBIN A1C
Hgb A1c MFr Bld: 5.3 % (ref 4.8–5.6)
Mean Plasma Glucose: 105.41 mg/dL

## 2024-09-11 LAB — LACTIC ACID, PLASMA: Lactic Acid, Venous: 1.5 mmol/L (ref 0.5–1.9)

## 2024-09-11 LAB — PROCALCITONIN: Procalcitonin: 8.01 ng/mL

## 2024-09-11 MED ORDER — METOPROLOL TARTRATE 5 MG/5ML IV SOLN
5.0000 mg | Freq: Four times a day (QID) | INTRAVENOUS | Status: DC
  Administered 2024-09-11 – 2024-09-15 (×13): 5 mg via INTRAVENOUS
  Filled 2024-09-11 (×14): qty 5

## 2024-09-11 MED ORDER — SODIUM CHLORIDE 0.9 % IV SOLN
1.0000 g | Freq: Two times a day (BID) | INTRAVENOUS | Status: AC
  Administered 2024-09-11 – 2024-09-17 (×14): 1 g via INTRAVENOUS
  Filled 2024-09-11 (×14): qty 20

## 2024-09-11 MED ORDER — DEXTROSE-SODIUM CHLORIDE 5-0.45 % IV SOLN: INTRAVENOUS | Status: DC

## 2024-09-11 MED ORDER — VANCOMYCIN HCL IN DEXTROSE 1-5 GM/200ML-% IV SOLN: 1000.0000 mg | INTRAVENOUS | Status: DC

## 2024-09-11 MED ORDER — SODIUM CHLORIDE 0.9 % IV SOLN
2.0000 g | Freq: Two times a day (BID) | INTRAVENOUS | Status: DC
  Filled 2024-09-11 (×2): qty 40

## 2024-09-11 NOTE — Plan of Care (Signed)
  Problem: Education: Goal: Knowledge of General Education information will improve Description: Including pain rating scale, medication(s)/side effects and non-pharmacologic comfort measures Outcome: Not Progressing   Problem: Health Behavior/Discharge Planning: Goal: Ability to manage health-related needs will improve Outcome: Not Progressing   Problem: Clinical Measurements: Goal: Ability to maintain clinical measurements within normal limits will improve Outcome: Progressing Goal: Will remain free from infection Outcome: Progressing Goal: Diagnostic test results will improve Outcome: Progressing Goal: Respiratory complications will improve Outcome: Progressing Goal: Cardiovascular complication will be avoided Outcome: Progressing   Problem: Nutrition: Goal: Adequate nutrition will be maintained Outcome: Not Progressing   Problem: Coping: Goal: Level of anxiety will decrease Outcome: Not Progressing   Problem: Elimination: Goal: Will not experience complications related to bowel motility Outcome: Progressing Goal: Will not experience complications related to urinary retention Outcome: Progressing   Problem: Pain Managment: Goal: General experience of comfort will improve and/or be controlled Outcome: Progressing   Problem: Safety: Goal: Ability to remain free from injury will improve Outcome: Progressing   Problem: Skin Integrity: Goal: Risk for impaired skin integrity will decrease Outcome: Progressing   Problem: Education: Goal: Ability to describe self-care measures that may prevent or decrease complications (Diabetes Survival Skills Education) will improve Outcome: Progressing Goal: Individualized Educational Video(s) Outcome: Progressing   Problem: Coping: Goal: Ability to adjust to condition or change in health will improve Outcome: Progressing   Problem: Fluid Volume: Goal: Ability to maintain a balanced intake and output will improve Outcome:  Progressing   Problem: Health Behavior/Discharge Planning: Goal: Ability to identify and utilize available resources and services will improve Outcome: Progressing Goal: Ability to manage health-related needs will improve Outcome: Progressing   Problem: Metabolic: Goal: Ability to maintain appropriate glucose levels will improve Outcome: Progressing   Problem: Nutritional: Goal: Maintenance of adequate nutrition will improve Outcome: Progressing Goal: Progress toward achieving an optimal weight will improve Outcome: Progressing   Problem: Skin Integrity: Goal: Risk for impaired skin integrity will decrease Outcome: Progressing   Problem: Tissue Perfusion: Goal: Adequacy of tissue perfusion will improve Outcome: Progressing   Problem: Safety: Goal: Non-violent Restraint(s) Outcome: Progressing

## 2024-09-11 NOTE — Progress Notes (Signed)
 Patient noted on telemetry to going between 115-140's, reading A-fib. Patient alert, sitting up in bed, denies pain or shortness of breath but is only alert and oriented to self for the most part. EKG done and Dr Vicci made aware. New orders placed. Patient also restless and anxious so PRN given also. Dr Vicci aware. Report given to Night RN. Family at bedside, emotional support provided.

## 2024-09-11 NOTE — Consult Note (Signed)
 Consultation Note Date: 09/11/2024   Patient Name: Katelyn Ballard  DOB: 1945/11/12  MRN: [id number]  Age / Sex: 79 y.o., female  PCP: Bluford Carlin LABOR, MD Referring Physician: Vicci Afton CROME, MD  Reason for Consultation: Establishing goals of care  HPI/Patient Profile: 79 y.o. female  with past medical history of dementia, stroke CAD s/p PCI, diastolic heart failure, mitral valve regurgitation, hypertension, hyperlipidemia, diabetes mellitus type 2, CKD stage III admitted on 09/09/2024 with acute encephalopathy.   Patient presented after a fall with altered mental status.  Diagnosed with acute encephalopathy in the setting of infection and dehydration.  Also noted to have an AKI and hypernatremia which improved with some IV fluids.  Developed sepsis and had to be transferred to ICU/stepdown with hypotension and worsening lactic acid.  Required vasopressor support.  On IV antibiotics. Patient was transitioned from full code to DNR/DNI by attending physician and PMT has been consulted to assist with ongoing goals of care conversation.  Today, labs independently reviewed.  Most recent sodium levels have improved with current 139 compared to 147 on 09/10/2024.  Potassium has also normalized at 4 mmol/L (9/11: 3.2).  Renal function has progressed with worsening AKI with creatinine 1.96, BUN 39, eGFR 25 compared to 09/10/2024: Creatinine 1.55, BUN 33, eGFR 34.  She is on IV fluids and IV antibiotics for sepsis secondary to UTI.  Lactic acid was elevated at 3.8 yesterday but has normalized to 1.5 today.  Significant increase in white blood cell count compared to yesterday.  CBC from 9/12 reviewed and revealed significant increase in WBCs 25 (9/11: 9.4), decline in hemoglobin 9.9 (9/11: 12.3 g/d), and progressive thrombocytopenia with a platelet count of 98 (9/11: 118).  CT abdomen and pelvis independently reviewed.  Overall appearance is unremarkable without significant acute  finding. Diverticula present in left colon and some aortic atherosclerosis noted.  Chest x-ray independently reviewed no obvious pulmonary edema or infiltrate .  EKG also independently reviewed.  Some artifact present but appears to be consistent with sinus rhythm.  Vital signs reviewed and reveals some persistent hypotension.  Heart rate stable with some intermittent tachypnea.  Medication administration record reviewed.  Patient has required 0.5 mg of oral clonazepam  on 24-hour look back and a total of 10 mg of oral oxycodone  (2 doses).  Today, patient does not answer questions. She mumbles unintelligibly when asked questions.   Independent history obtained from nursing and family as patient has underlying dementia and therefore ability to provide accurate history is limited.  Nursing staff report poor p.o. intake today.  Therefore she refused breakfast.  They have been having to feed her.  They have also been increasing her Levophed .  They note she is very sweet but appears not to be doing well.  Clinical Assessment and Goals of Care:  I have reviewed medical records including EPIC notes, labs and imaging (independently reviewed), MAR, vitals, prior specialist notes, assessed the patient and then met with family to discuss diagnosis prognosis, GOC, EOL wishes, disposition and options. Collaborated directly with attending physician, bedside nursing staff, and TOC.   I introduced Palliative Medicine as specialized medical care for people living with serious illness. It focuses on providing relief from the symptoms and stress of a serious illness. The goal is to improve quality of life for both the patient and the family.  We discussed a brief life review of the patient and then focused on their current illness.   I attempted to elicit values and goals of  care important to the patient.    Medical History Review and Family/Patient Understanding:   Extensive education provided to daughter and sons  regarding our concerns for patient's current condition (severe sepsis requiring vasopressor support) and the implications this acute illness will likely have on her long-term prognosis and functional abilities.  I also discussed the severity of her illness and that while we are managing her aggressively at this time there is potential for her to decline further.  We also discussed at length the disease trajectory of dementia and how acute illnesses can accelerate decline in dementia.  Specific education also provided of where patient is in her dementia journey.  After our discussion, patient's family expresses understanding.  Social History:  Patient with advanced dementia.  She has 2 sons and 1 daughter.  Currently lives in long-term care facility.  Her children are involved and supportive.  Functional and Nutritional State:  Patient lives in a long-term care facility [facility]) apparently she was quite functional until 2 years ago when she suffered from a stroke.  The stroke predominantly affected her memory and speech but after the hospitalization she also had a significant functional decline and has pretty much been wheelchair-bound ever since.  She has had a more rapid decline over the past 3 weeks with worsening confusion and frequent falls.  Her daughter states she will be just sitting in the chair and fall forward out of it.  She is also seem to have a change in her personality and has been more irritable and stating things like she is ready to die.  Over the past week or so she has also started seeing deceased family members.  Palliative Symptoms:  None currently  Advance Directives/Goals of Care/Anticipatory care planning Discussion:  The family consented to a voluntary Advance Care Planning Conversation in person. Individuals present for the conversation: this NP, Luke Media, India Fenton, and Francis Fenton  A detailed discussion regarding GOC, advanced directives, and anticipatory  care planning was had.  Elicited goals of care.  At this point, family is most concerned with maintaining quality of life.  They note that at baseline her quality of life is only fair as she has to be in a skilled nursing facility and is wheelchair-bound.  They know that a life where she had to be bedbound and completely dependent on others for all ADLs and certainly a life where she would be dependent on machines is not a quality of life she would find acceptable.  After our discussion as previously mentioned, they are aware of the gravity of her illness and high likelihood of significant functional decline.  The difference between aggressive medical intervention and comfort care was considered in light of the patient's goals of care.  I discussed the different paths that we could pursue at this point in her illness.  Discussed continuing current aggressive measures with IV antibiotics and vasopressor support with additional time for outcomes (24-48 hours) versus transition to comfort care. Extensively reviewed the components of inpatient comfort care, including cessation of non-essential interventions (e.g., labs, CBGs, telemetry, frequent vitals), discontinuation of non-comfort medications, and initiation of medications to manage symptoms and promote comfort.  Discussed that regardless of what ever path we choose, she may benefit from hospice services. Reviewed hospice philosophy of care, including focus on comfort, quality of life, and support for patients and families facing terminal illness. Discussed eligibility criteria and potential benefits of enrollment.  We also discussed the various locations where hospice care can  occur in this case inpatient hospice facility if prognosis of less than 2 weeks versus back to long-term care with hospice support.  After much consideration, family would like to continuing to treat the treatable for the time being.  They will continue to discuss whether or not to transition  to comfort care.  They are also aware that she has potential to further decline and if her status further declines, then they would not want to escalate care and transition to comfort care only.  Discussed the importance of continued conversation with family and the medical providers regarding overall plan of care and treatment options, ensuring decisions are within the context of the patient's values and GOCs.   Questions and concerns were addressed. The family was encouraged to call with questions or concerns.  PMT will continue to support holistically.  Primary Decision maker and health care surrogate:  OTHER daughter and sons  Code Status:  DNR/DNI (confirmed code status with family today)  I spent 40 minutes providing separately identifiable ACP services with the patient and/or surrogate decision maker in a voluntary, in-person conversation discussing the patient's wishes and goals as detailed in the above note.    SUMMARY OF RECOMMENDATIONS    DNR/DNI confirmed Time for outcomes (24-48 hrs) For any further decline transition to comfort care Family continues to consider transition to comfort measures Hospice at discharge to provide EOL care and support (family in agreement) Palliative medicine team will continue to follow for ongoing goals of care discussion, symptom management, and coordination of care.  Code Status/Advance Care Planning: DNR   Symptom Management:  Symptoms stable at present, therefore continue symptom regimen per admitting team with PMT available as needed for support   Prognosis:  Unable to determine  Discharge Planning: To Be Determined either SNF with hospice vs inpatient hospice     Primary Diagnoses: Present on Admission:  Acute kidney injury (HCC)  Encephalopathy chronic  AKI (acute kidney injury) (HCC)    Physical Exam Constitutional:      General: She is not in acute distress.    Appearance: She is not toxic-appearing.     Comments:  Chronically ill appearing  Pulmonary:     Effort: Pulmonary effort is normal. No respiratory distress.  Skin:    General: Skin is warm and dry.     Comments: Bruising present to the right side of her face appears old and to be healing  Neurological:     Comments: Drowsy, does not answer questions appropriately     Vital Signs: BP (!) 95/49   Pulse 71   Temp 97.9 F (36.6 C) (Axillary)   Resp 19   Ht 5' 3 (1.6 m)   Wt 70.3 kg   SpO2 99%   BMI 27.45 kg/m  Pain Scale: CPOT   Pain Score: Asleep   SpO2: SpO2: 99 % O2 Device:SpO2: 99 % O2 Flow Rate: .O2 Flow Rate (L/min): 2 L/min   Palliative Assessment/Data: 30-40%    Billing based on MDM: High  Problems Addressed: One acute or chronic illness or injury that poses a threat to life or bodily function  Amount and/or Complexity of Data: Category 1:Assessment requiring an independent historian(s), Category 2:Independent interpretation of a test performed by another physician/other qualified health care professional (not separately reported), and Category 3:Discussion of management or test interpretation with external physician/other qualified health care professional/appropriate source (not separately reported)  Risks: n/a   [name], NP  Palliative Medicine Team Team phone # [phone]  Thank you for allowing the Palliative Medicine Team to assist in the care of this patient. Please utilize secure chat with additional questions, if there is no response within 30 minutes please call the above phone number.  Palliative Medicine Team providers are available by phone from 7am to 7pm daily and can be reached through the team cell phone.  Should this patient require assistance outside of these hours, please call the patient's attending physician.

## 2024-09-11 NOTE — Progress Notes (Signed)
 PROGRESS NOTE   LAVAEH Ballard  FMW:[id number] DOB: 12-Jul-1945 DOA: 09/09/2024 PCP: Bluford Carlin LABOR, MD   Chief Complaint  Patient presents with   Fall   Level of care: ICU  Brief Admission History:  79 y.o. female with a history of dementia, stroke CAD s/p PCI, diastolic heart failure, mitral valve regurgitation, hypertension, hyperlipidemia, diabetes mellitus type 2, CKD stage III.  Patient presented secondary to fall with altered mental status and concern for metabolic encephalopathy secondary to UTI. Empiric antibiotics started. During hospitalization, patient developed worsening hypotension, consistent with severe sepsis, and was transferred to stepdown/ICU. Goals of care discussions with palliative medicine team with expressed understanding of not escalating care.   Assessment and Plan:  Severe sepsis with septic shock  Not present on admission. Secondary to dehydration. Lactic acid of 3.2. 2 liters of lactated ringers  given with repeat lactic acid slightly elevated at 3.4, however second bolus had not completed. Procalcitonin obtained and is elevated at 6.21. -Continue IV pressors, wean off as able, continue supportive measures  -lactic acidosis resolved with IV fluid hydration -discussed with palliative team after family meeting, no escalation of care, daughters to discuss further with sons regarding residential hospice placement.   E coli UTI --continue IV meropenem  -- follow sensitivities pending  Acute metabolic encephalopathy -- remains encephalopathic -- monitor for signs of meaningful recovery   Hypernatremia --resolved with increased free water delivery  Leukocytosis -- marked bump in WBC overnight -- treating as above  -- CBC with diff in AM   AKI on CKD stage 3a -- no significant improvement -- check renal US  to rule out obstruction -- follow  Type 2 DM  -- continue SSI coverage and frequent CBG monitoring CBG (last 3)  Recent Labs    09/10/24 2104  09/11/24 0744 09/11/24 1137  GLUCAP 116* 151* 169*   DVT prophylaxis: sq hep Code Status: DNR DNI Family Communication: family meeting with palliative  Disposition:anticipating residential hospice    Consultants:  Palliative care   Subjective: Pt is very confused/encephalopathic  Objective: Vitals:   09/11/24 1202 09/11/24 1300 09/11/24 1400 09/11/24 1500  BP:  (!) 117/56 128/62 (!) 115/56  Pulse:  70 76 71  Resp:  15 18 (!) 21  Temp: (!) 96.4 F (35.8 C)     TempSrc: Axillary     SpO2:  100% 100% 100%  Weight:      Height:        Intake/Output Summary (Last 24 hours) at 09/11/2024 1603 Last data filed at 09/11/2024 1351 Gross per 24 hour  Intake 3745.6 ml  Output 975 ml  Net 2770.6 ml   Filed Weights   09/09/24 1706  Weight: 70.3 kg   Examination:  General exam: Appears chronically ill and encephalpathic, large bruise around right eye socket.  Respiratory system: Clear to auscultation. Respiratory effort normal. Cardiovascular system: normal S1 & S2 heard. No JVD, murmurs, rubs, gallops or clicks. No pedal edema. Gastrointestinal system: Abdomen is nondistended, soft and nontender. No organomegaly or masses felt. Normal bowel sounds heard. Central nervous system: Alert and oriented. No focal neurological deficits. Extremities: Symmetric 5 x 5 power. Skin: No rashes, lesions or ulcers. Psychiatry: Judgement and insight appear severely diminished.  Data Reviewed: I have personally reviewed following labs and imaging studies  CBC: Recent Labs  Lab 09/09/24 1932 09/10/24 0525 09/11/24 0613  WBC 9.2 9.4 25.0*  NEUTROABS 3.9  --   --   HGB 11.8* 12.3 9.9*  HCT 38.5 40.2 31.7*  MCV 95.3 96.6 96.1  PLT 108* 118* 98*    Basic Metabolic Panel: Recent Labs  Lab 09/09/24 1932 09/10/24 0525 09/11/24 0613  NA 145 147* 139  K 3.5 3.2* 4.0  CL 106 109 108  CO2 27 24 23   GLUCOSE 78 82 151*  BUN 37* 33* 39*  CREATININE 1.71* 1.55* 1.96*  CALCIUM  8.8*  8.4* 8.0*  MG  --  1.8  --   PHOS  --  2.9  --     CBG: Recent Labs  Lab 09/10/24 1550 09/10/24 2104 09/11/24 0744 09/11/24 1137  GLUCAP 136* 116* 151* 169*    Recent Results (from the past 240 hours)  Urine Culture     Status: Abnormal (Preliminary result)   Collection Time: 09/09/24  8:00 PM   Specimen: Urine, Clean Catch  Result Value Ref Range Status   Specimen Description   Final    URINE, CLEAN CATCH Performed at [facility], [address] [place], [place] [address]    Special Requests   Final    NONE Performed at [facility], [address], [place], [place] [address]    Culture (A)  Final    >=100,000 COLONIES/mL ESCHERICHIA COLI SUSCEPTIBILITIES TO FOLLOW Performed at [facility], [address], [place], [place] [address]    Report Status PENDING  Incomplete  MRSA Next Gen by PCR, Nasal     Status: None   Collection Time: 09/10/24  3:10 PM   Specimen: Nasal Mucosa; Nasal Swab  Result Value Ref Range Status   MRSA by PCR Next Gen NOT DETECTED NOT DETECTED Final    Comment: (NOTE) The GeneXpert MRSA Assay (FDA approved for NASAL specimens only), is one component of a comprehensive MRSA colonization surveillance program. It is not intended to diagnose MRSA infection nor to guide or monitor treatment for MRSA infections. Test performance is not FDA approved in patients less than 18 years old. Performed at [facility], [address], [place], [place] [address]   Culture, blood (Routine X 2) w Reflex to ID Panel     Status: None (Preliminary result)   Collection Time: 09/10/24  3:34 PM   Specimen: BLOOD  Result Value Ref Range Status   Specimen Description BLOOD LEFT ANTECUBITAL  Final   Special Requests   Final    BOTTLES DRAWN AEROBIC ONLY Blood Culture adequate volume   Culture   Final    NO GROWTH < 24 HOURS Performed at [facility], [address] [place], [place] [address]    Report Status PENDING  Incomplete  Culture, blood (Routine X  2) w Reflex to ID Panel     Status: None (Preliminary result)   Collection Time: 09/10/24  3:34 PM   Specimen: BLOOD  Result Value Ref Range Status   Specimen Description BLOOD BLOOD LEFT HAND  Final   Special Requests   Final    BOTTLES DRAWN AEROBIC ONLY Blood Culture results may not be optimal due to an inadequate volume of blood received in culture bottles   Culture   Final    NO GROWTH < 24 HOURS Performed at [facility], [address] [place], [place] [address]    Report Status PENDING  Incomplete     Radiology Studies: DG CHEST PORT 1 VIEW Result Date: 09/11/2024 CLINICAL DATA:  Hypotension. EXAM: PORTABLE CHEST 1 VIEW COMPARISON:  September 23, 2023. FINDINGS: Stable cardiomediastinal silhouette. Minimal bibasilar subsegmental atelectasis or scarring is noted. Bony thorax is unremarkable. IMPRESSION:  Minimal bibasilar subsegmental atelectasis or scarring. Electronically Signed   By: Lynwood Landy Raddle M.D.   On: 09/11/2024 11:24   CT ABDOMEN PELVIS WO CONTRAST Result Date: 09/10/2024 CLINICAL DATA:  Acute kidney injury assess for intra-abdominal infection EXAM: CT ABDOMEN AND PELVIS WITHOUT CONTRAST TECHNIQUE: Multidetector CT imaging of the abdomen and pelvis was performed following the standard protocol without IV contrast. RADIATION DOSE REDUCTION: This exam was performed according to the departmental dose-optimization program which includes automated exposure control, adjustment of the mA and/or kV according to patient size and/or use of iterative reconstruction technique. COMPARISON:  None Available. FINDINGS: Lower chest: Lung bases demonstrate no acute airspace disease. Atelectasis at the lung bases. Multi-vessel coronary vascular calcification. Hepatobiliary: Motion degradation. Gallstones. No biliary dilatation. Pancreas: Unremarkable. No pancreatic ductal dilatation or surrounding inflammatory changes. Spleen: Normal in size without focal abnormality. Adrenals/Urinary Tract:  Adrenal glands are normal. Kidneys show no hydronephrosis. Multiple cysts in the left kidney, no imaging follow-up is recommended. The bladder is unremarkable Stomach/Bowel: The stomach is within normal limits. No dilated small bowel. No acute bowel wall thickening. Negative appendix. Mild diverticular disease of the left colon Vascular/Lymphatic: Aortic atherosclerosis. No enlarged abdominal or pelvic lymph nodes. Reproductive: Hysterectomy.  No adnexal mass Other: Negative for pelvic effusion or free air. Moderate fat containing periumbilical hernia Musculoskeletal: No acute or suspicious osseous abnormality IMPRESSION: 1. No CT evidence for acute intra-abdominal or pelvic abnormality allowing for motion degradation. 2. Gallstones. 3. Mild diverticular disease of the left colon without acute inflammatory process. 4. Aortic atherosclerosis. Aortic Atherosclerosis (ICD10-I70.0). Electronically Signed   By: Luke Bun M.D.   On: 09/10/2024 23:44   CT Head Wo Contrast Result Date: 09/09/2024 CLINICAL DATA:  Fall with neck pain hematoma to right eye and forehead EXAM: CT HEAD WITHOUT CONTRAST CT MAXILLOFACIAL WITHOUT CONTRAST CT CERVICAL SPINE WITHOUT CONTRAST TECHNIQUE: Multidetector CT imaging of the head, cervical spine, and maxillofacial structures were performed using the standard protocol without intravenous contrast. Multiplanar CT image reconstructions of the cervical spine and maxillofacial structures were also generated. RADIATION DOSE REDUCTION: This exam was performed according to the departmental dose-optimization program which includes automated exposure control, adjustment of the mA and/or kV according to patient size and/or use of iterative reconstruction technique. COMPARISON:  MRI 12/10/2023, CT brain and cervical spine 09/23/2023 FINDINGS: CT HEAD FINDINGS Brain: No acute territorial infarction, hemorrhage or intracranial mass. Advanced atrophy. Moderate severe chronic small vessel ischemic  changes of the white matter. The ventricles are enlarged but stable in size, presumably due to atrophy. Vascular: No hyperdense vessels. Vertebral and carotid vascular calcification Skull: Normal. Negative for fracture or focal lesion. Other: None CT MAXILLOFACIAL FINDINGS Osseous: Slightly limited by motion degradation. Mastoid air cells are clear. No definitive mandibular fracture. Pterygoid plates and zygomatic arches are intact. No acute nasal bone fracture Orbits: Negative. No traumatic or inflammatory finding. Sinuses: Clear. Soft tissues: Negative. CT CERVICAL SPINE FINDINGS Alignment: No subluxation.  Facet alignment is normal Skull base and vertebrae: No acute fracture. Vertebral body heights are grossly maintained Soft tissues and spinal canal: No prevertebral fluid or swelling. No visible canal hematoma. Disc levels: Advanced multilevel degenerative changes C3 through C7 with multilevel disc space narrowing, osteophyte and partial ankylosis. Irregular endplate erosive changes at C7-T1 with interval sclerosis, consistent with sequela of known osteomyelitis discitis. Multilevel facet degenerative change and ankylosis. Multilevel foraminal narrowing. Upper chest: Lung apices are clear.  Emphysema Other: None IMPRESSION: 1. No CT evidence for acute intracranial abnormality. Atrophy  and chronic small vessel ischemic changes of the white matter. 2. No acute facial bone fracture allowing for motion degradation. 3. No acute osseous abnormality of the cervical spine. 4. 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 5. Emphysema Electronically Signed   By: Luke Bun M.D.   On: 09/09/2024 19:45   CT Maxillofacial Wo Contrast Result Date: 09/09/2024 CLINICAL DATA:  Fall with neck pain hematoma to right eye and forehead EXAM: CT HEAD WITHOUT CONTRAST CT MAXILLOFACIAL WITHOUT CONTRAST CT CERVICAL SPINE WITHOUT CONTRAST TECHNIQUE:  Multidetector CT imaging of the head, cervical spine, and maxillofacial structures were performed using the standard protocol without intravenous contrast. Multiplanar CT image reconstructions of the cervical spine and maxillofacial structures were also generated. RADIATION DOSE REDUCTION: This exam was performed according to the departmental dose-optimization program which includes automated exposure control, adjustment of the mA and/or kV according to patient size and/or use of iterative reconstruction technique. COMPARISON:  MRI 12/10/2023, CT brain and cervical spine 09/23/2023 FINDINGS: CT HEAD FINDINGS Brain: No acute territorial infarction, hemorrhage or intracranial mass. Advanced atrophy. Moderate severe chronic small vessel ischemic changes of the white matter. The ventricles are enlarged but stable in size, presumably due to atrophy. Vascular: No hyperdense vessels. Vertebral and carotid vascular calcification Skull: Normal. Negative for fracture or focal lesion. Other: None CT MAXILLOFACIAL FINDINGS Osseous: Slightly limited by motion degradation. Mastoid air cells are clear. No definitive mandibular fracture. Pterygoid plates and zygomatic arches are intact. No acute nasal bone fracture Orbits: Negative. No traumatic or inflammatory finding. Sinuses: Clear. Soft tissues: Negative. CT CERVICAL SPINE FINDINGS Alignment: No subluxation.  Facet alignment is normal Skull base and vertebrae: No acute fracture. Vertebral body heights are grossly maintained Soft tissues and spinal canal: No prevertebral fluid or swelling. No visible canal hematoma. Disc levels: Advanced multilevel degenerative changes C3 through C7 with multilevel disc space narrowing, osteophyte and partial ankylosis. Irregular endplate erosive changes at C7-T1 with interval sclerosis, consistent with sequela of known osteomyelitis discitis. Multilevel facet degenerative change and ankylosis. Multilevel foraminal narrowing. Upper chest: Lung  apices are clear.  Emphysema Other: None IMPRESSION: 1. No CT evidence for acute intracranial abnormality. Atrophy and chronic small vessel ischemic changes of the white matter. 2. No acute facial bone fracture allowing for motion degradation. 3. No acute osseous abnormality of the cervical spine. 4. 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 5. Emphysema Electronically Signed   By: Luke Bun M.D.   On: 09/09/2024 19:45   CT Cervical Spine Wo Contrast Result Date: 09/09/2024 CLINICAL DATA:  Fall with neck pain hematoma to right eye and forehead EXAM: CT HEAD WITHOUT CONTRAST CT MAXILLOFACIAL WITHOUT CONTRAST CT CERVICAL SPINE WITHOUT CONTRAST TECHNIQUE: Multidetector CT imaging of the head, cervical spine, and maxillofacial structures were performed using the standard protocol without intravenous contrast. Multiplanar CT image reconstructions of the cervical spine and maxillofacial structures were also generated. RADIATION DOSE REDUCTION: This exam was performed according to the departmental dose-optimization program which includes automated exposure control, adjustment of the mA and/or kV according to patient size and/or use of iterative reconstruction technique. COMPARISON:  MRI 12/10/2023, CT brain and cervical spine 09/23/2023 FINDINGS: CT HEAD FINDINGS Brain: No acute territorial infarction, hemorrhage or intracranial mass. Advanced atrophy. Moderate severe chronic small vessel ischemic changes of the white matter. The ventricles are enlarged but stable in size, presumably due to atrophy. Vascular: No  hyperdense vessels. Vertebral and carotid vascular calcification Skull: Normal. Negative for fracture or focal lesion. Other: None CT MAXILLOFACIAL FINDINGS Osseous: Slightly limited by motion degradation. Mastoid air cells are clear. No definitive mandibular fracture. Pterygoid plates and zygomatic arches are intact. No acute  nasal bone fracture Orbits: Negative. No traumatic or inflammatory finding. Sinuses: Clear. Soft tissues: Negative. CT CERVICAL SPINE FINDINGS Alignment: No subluxation.  Facet alignment is normal Skull base and vertebrae: No acute fracture. Vertebral body heights are grossly maintained Soft tissues and spinal canal: No prevertebral fluid or swelling. No visible canal hematoma. Disc levels: Advanced multilevel degenerative changes C3 through C7 with multilevel disc space narrowing, osteophyte and partial ankylosis. Irregular endplate erosive changes at C7-T1 with interval sclerosis, consistent with sequela of known osteomyelitis discitis. Multilevel facet degenerative change and ankylosis. Multilevel foraminal narrowing. Upper chest: Lung apices are clear.  Emphysema Other: None IMPRESSION: 1. No CT evidence for acute intracranial abnormality. Atrophy and chronic small vessel ischemic changes of the white matter. 2. No acute facial bone fracture allowing for motion degradation. 3. No acute osseous abnormality of the cervical spine. 4. 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 5. Emphysema Electronically Signed   By: Luke Bun M.D.   On: 09/09/2024 19:45    Scheduled Meds:  allopurinol   300 mg Oral Daily   Chlorhexidine  Gluconate Cloth  6 each Topical Daily   clopidogrel   75 mg Oral q AM   divalproex   375 mg Oral TID   heparin   5,000 Units Subcutaneous Q8H   insulin  aspart  0-9 Units Subcutaneous TID WC   melatonin  6 mg Oral QHS   mirtazapine   15 mg Oral QHS   rosuvastatin   10 mg Oral QPM   Continuous Infusions:  sodium chloride  10 mL/hr at 09/11/24 1042   dextrose  5 % and 0.45 % NaCl 125 mL/hr at 09/11/24 1457   meropenem  (MERREM ) IV 1 g (09/11/24 1457)   norepinephrine  (LEVOPHED ) Adult infusion 6 mcg/min (09/11/24 1232)   [START ON 09/12/2024] vancomycin        LOS: 1 day   Critical Care Procedure  Note Authorized and Performed by: KYM Louder MD  Total Critical Care time:  59 mins Due to a high probability of clinically significant, life threatening deterioration, the patient required my highest level of preparedness to intervene emergently and I personally spent this critical care time directly and personally managing the patient.  This critical care time included obtaining a history; examining the patient, pulse oximetry; ordering and review of studies; arranging urgent treatment with development of a management plan; evaluation of patient's response of treatment; frequent reassessment; and discussions with other providers.  This critical care time was performed to assess and manage the high probability of imminent and life threatening deterioration that could result in multi-organ failure.  It was exclusive of separately billable procedures and treating other patients and teaching time.    Afton Louder, MD How to contact the [facility] Attending or Consulting provider 7A - 7P or covering provider during after hours 7P -7A, for this patient?  Check the care team in [facility] and look for a) attending/consulting [facility] provider listed and b) the [facility] team listed Log into [email or web address] to find provider on call.  Locate the [facility] provider you are looking for under [facility] and page to a number that you can be directly reached. If you still have difficulty reaching the provider, please page the [facility] (Director  on Call) for the Hospitalists listed on amion for assistance.  09/11/2024, 4:03 PM

## 2024-09-11 NOTE — Plan of Care (Signed)
  Problem: Education: Goal: Knowledge of General Education information will improve Description: Including pain rating scale, medication(s)/side effects and non-pharmacologic comfort measures Outcome: Progressing   Problem: Health Behavior/Discharge Planning: Goal: Ability to manage health-related needs will improve Outcome: Progressing   Problem: Clinical Measurements: Goal: Ability to maintain clinical measurements within normal limits will improve Outcome: Progressing Goal: Will remain free from infection Outcome: Progressing Goal: Diagnostic test results will improve Outcome: Progressing Goal: Respiratory complications will improve Outcome: Progressing Goal: Cardiovascular complication will be avoided Outcome: Progressing   Problem: Activity: Goal: Risk for activity intolerance will decrease Outcome: Progressing   Problem: Nutrition: Goal: Adequate nutrition will be maintained Outcome: Progressing   Problem: Coping: Goal: Level of anxiety will decrease Outcome: Progressing   Problem: Safety: Goal: Ability to remain free from injury will improve Outcome: Progressing   Problem: Skin Integrity: Goal: Risk for impaired skin integrity will decrease Outcome: Progressing   Problem: Education: Goal: Ability to describe self-care measures that may prevent or decrease complications (Diabetes Survival Skills Education) will improve Outcome: Progressing Goal: Individualized Educational Video(s) Outcome: Progressing

## 2024-09-12 DIAGNOSIS — A498 Other bacterial infections of unspecified site: Secondary | ICD-10-CM | POA: Diagnosis not present

## 2024-09-12 DIAGNOSIS — N390 Urinary tract infection, site not specified: Secondary | ICD-10-CM

## 2024-09-12 DIAGNOSIS — N179 Acute kidney failure, unspecified: Secondary | ICD-10-CM | POA: Diagnosis not present

## 2024-09-12 DIAGNOSIS — G9349 Other encephalopathy: Secondary | ICD-10-CM | POA: Diagnosis not present

## 2024-09-12 LAB — CBC WITH DIFFERENTIAL/PLATELET
Abs Immature Granulocytes: 0.24 K/uL — ABNORMAL HIGH (ref 0.00–0.07)
Basophils Absolute: 0.1 K/uL (ref 0.0–0.1)
Basophils Relative: 0 %
Eosinophils Absolute: 0.3 K/uL (ref 0.0–0.5)
Eosinophils Relative: 2 %
HCT: 31.5 % — ABNORMAL LOW (ref 36.0–46.0)
Hemoglobin: 9.7 g/dL — ABNORMAL LOW (ref 12.0–15.0)
Immature Granulocytes: 1 %
Lymphocytes Relative: 10 %
Lymphs Abs: 1.9 K/uL (ref 0.7–4.0)
MCH: 29.8 pg (ref 26.0–34.0)
MCHC: 30.8 g/dL (ref 30.0–36.0)
MCV: 96.6 fL (ref 80.0–100.0)
Monocytes Absolute: 0.8 K/uL (ref 0.1–1.0)
Monocytes Relative: 4 %
Neutro Abs: 15.1 K/uL — ABNORMAL HIGH (ref 1.7–7.7)
Neutrophils Relative %: 83 %
Platelets: 93 K/uL — ABNORMAL LOW (ref 150–400)
RBC: 3.26 MIL/uL — ABNORMAL LOW (ref 3.87–5.11)
RDW: 16.8 % — ABNORMAL HIGH (ref 11.5–15.5)
WBC: 18.3 K/uL — ABNORMAL HIGH (ref 4.0–10.5)
nRBC: 0.2 % (ref 0.0–0.2)

## 2024-09-12 LAB — BASIC METABOLIC PANEL WITH GFR
Anion gap: 7 (ref 5–15)
BUN: 32 mg/dL — ABNORMAL HIGH (ref 8–23)
CO2: 22 mmol/L (ref 22–32)
Calcium: 8.2 mg/dL — ABNORMAL LOW (ref 8.9–10.3)
Chloride: 111 mmol/L (ref 98–111)
Creatinine, Ser: 1.47 mg/dL — ABNORMAL HIGH (ref 0.44–1.00)
GFR, Estimated: 36 mL/min — ABNORMAL LOW (ref >60)
Glucose, Bld: 161 mg/dL — ABNORMAL HIGH (ref 70–99)
Potassium: 3.7 mmol/L (ref 3.5–5.1)
Sodium: 140 mmol/L (ref 135–145)

## 2024-09-12 LAB — GLUCOSE, CAPILLARY
Glucose-Capillary: 174 mg/dL — ABNORMAL HIGH (ref 70–99)
Glucose-Capillary: 152 mg/dL — ABNORMAL HIGH (ref 70–99)
Glucose-Capillary: 138 mg/dL — ABNORMAL HIGH (ref 70–99)
Glucose-Capillary: 192 mg/dL — ABNORMAL HIGH (ref 70–99)

## 2024-09-12 LAB — URINE CULTURE: Culture: >=100000 — AB

## 2024-09-12 LAB — PROCALCITONIN: Procalcitonin: 4.75 ng/mL

## 2024-09-12 MED ORDER — DEXTROSE-SODIUM CHLORIDE 5-0.45 % IV SOLN: INTRAVENOUS | Status: AC

## 2024-09-12 NOTE — Plan of Care (Signed)
  Problem: Clinical Measurements: Goal: Ability to maintain clinical measurements within normal limits will improve Outcome: Progressing Goal: Diagnostic test results will improve Outcome: Progressing Goal: Respiratory complications will improve Outcome: Progressing   Problem: Nutrition: Goal: Adequate nutrition will be maintained Outcome: Progressing

## 2024-09-12 NOTE — Progress Notes (Signed)
 PROGRESS NOTE   Katelyn Ballard  FMW:[id number] DOB: December 07, 1945 DOA: 09/09/2024 PCP: Bluford Carlin LABOR, MD   Chief Complaint  Patient presents with   Fall   Level of care: ICU  Brief Admission History:  79 y.o. female with a history of dementia, stroke CAD s/p PCI, diastolic heart failure, mitral valve regurgitation, hypertension, hyperlipidemia, diabetes mellitus type 2, CKD stage III.  Patient presented secondary to fall with altered mental status and concern for metabolic encephalopathy secondary to UTI. Empiric antibiotics started. During hospitalization, patient developed worsening hypotension, consistent with severe sepsis, and was transferred to stepdown/ICU. Goals of care discussions with palliative medicine team with expressed understanding of not escalating care.   Assessment and Plan:  Severe sepsis with septic shock  -Continue IV pressors, wean off as able, continue supportive measures  -lactic acidosis resolved with IV fluid hydration -discussed with palliative team after family meeting, no escalation of care, daughters to discuss further with sons regarding residential hospice placement.  -if no meaningful improvements over next 24 hours, plan is to transition focus of care to comfort and dignity  E coli UTI - multidrug resistant organism -- continue IV meropenem  as the E coli is sensitive to this  Acute metabolic encephalopathy -- remains encephalopathic -- monitor for signs of meaningful recovery   Hypernatremia --resolved with increased free water delivery  Leukocytosis -- WBC slowly trending down -- treating as above  -- CBC with diff in AM   AKI on CKD stage 3a -- slowly improving with IV fluid hydration -- recheck in AM   Type 2 DM  -- continue SSI coverage and frequent CBG monitoring CBG (last 3)  Recent Labs    09/11/24 1939 09/12/24 0737 09/12/24 1101  GLUCAP 140* 174* 152*   DVT prophylaxis: sq hep Code Status: DNR DNI Family Communication:  family meeting with palliative  Disposition:anticipating residential hospice if no meaningful improvement   Consultants:  Palliative care   Subjective: Pt reports she does not feel well.  Pt had some abdominal pain this morning.   Objective: Vitals:   09/12/24 1128 09/12/24 1130 09/12/24 1145 09/12/24 1200  BP:  (!) 106/58 106/64   Pulse:  74 84   Resp:  (!) 24 (!) 21   Temp: 98.3 F (36.8 C)     TempSrc: Oral     SpO2: 100% 100% 99% 100%  Weight:      Height:        Intake/Output Summary (Last 24 hours) at 09/12/2024 1220 Last data filed at 09/12/2024 1052 Gross per 24 hour  Intake 3519.6 ml  Output 1475 ml  Net 2044.6 ml   Filed Weights   09/09/24 1706  Weight: 70.3 kg   Examination:  General exam: Appears chronically ill and encephalpathic, large bruise around right eye socket.  Respiratory system: Clear to auscultation. Respiratory effort normal. Cardiovascular system: normal S1 & S2 heard. No JVD, murmurs, rubs, gallops or clicks. No pedal edema. Gastrointestinal system: Abdomen is nondistended, soft and nontender. No organomegaly or masses felt. Normal bowel sounds heard. Central nervous system: alert but disoriented. No focal neurological deficits. Extremities: Symmetric 5 x 5 power. Skin: No rashes, lesions or ulcers. Psychiatry: Judgement and insight appear severely diminished.  Data Reviewed: I have personally reviewed following labs and imaging studies  CBC: Recent Labs  Lab 09/09/24 1932 09/10/24 0525 09/11/24 0613 09/12/24 0538  WBC 9.2 9.4 25.0* 18.3*  NEUTROABS 3.9  --   --  15.1*  HGB 11.8* 12.3 9.9*  9.7*  HCT 38.5 40.2 31.7* 31.5*  MCV 95.3 96.6 96.1 96.6  PLT 108* 118* 98* 93*    Basic Metabolic Panel: Recent Labs  Lab 09/09/24 1932 09/10/24 0525 09/11/24 0613 09/12/24 0538  NA 145 147* 139 140  K 3.5 3.2* 4.0 3.7  CL 106 109 108 111  CO2 27 24 23 22   GLUCOSE 78 82 151* 161*  BUN 37* 33* 39* 32*  CREATININE 1.71* 1.55* 1.96*  1.47*  CALCIUM  8.8* 8.4* 8.0* 8.2*  MG  --  1.8  --   --   PHOS  --  2.9  --   --     CBG: Recent Labs  Lab 09/11/24 1137 09/11/24 1630 09/11/24 1939 09/12/24 0737 09/12/24 1101  GLUCAP 169* 168* 140* 174* 152*    Recent Results (from the past 240 hours)  Urine Culture     Status: Abnormal   Collection Time: 09/09/24  8:00 PM   Specimen: Urine, Clean Catch  Result Value Ref Range Status   Specimen Description   Final    URINE, CLEAN CATCH Performed at [facility], [address], [place], [place] [address]    Special Requests   Final    NONE Performed at [facility], [address] [place], [place] [address]    Culture (A)  Final    >=100,000 COLONIES/mL ESCHERICHIA COLI Confirmed Extended Spectrum Beta-Lactamase Producer (ESBL).  In bloodstream infections from ESBL organisms, carbapenems are preferred over piperacillin/tazobactam. They are shown to have a lower risk of mortality.    Report Status 09/12/2024 FINAL  Final   Organism ID, Bacteria ESCHERICHIA COLI (A)  Final      Susceptibility   Escherichia coli - MIC*    AMPICILLIN >=32 RESISTANT Resistant     CEFAZOLIN (URINE) Value in next row Resistant      >=32 RESISTANTThis is a modified FDA-approved test that has been validated and its performance characteristics determined by the reporting laboratory.  This laboratory is certified under the Clinical Laboratory Improvement Amendments CLIA as qualified to perform high complexity clinical laboratory testing.    CEFEPIME  Value in next row Resistant      >=32 RESISTANTThis is a modified FDA-approved test that has been validated and its performance characteristics determined by the reporting laboratory.  This laboratory is certified under the Clinical Laboratory Improvement Amendments CLIA as qualified to perform high complexity clinical laboratory testing.    ERTAPENEM Value in next row Sensitive      >=32 RESISTANTThis is a modified FDA-approved test that has been  validated and its performance characteristics determined by the reporting laboratory.  This laboratory is certified under the Clinical Laboratory Improvement Amendments CLIA as qualified to perform high complexity clinical laboratory testing.    CEFTRIAXONE  Value in next row Resistant      >=32 RESISTANTThis is a modified FDA-approved test that has been validated and its performance characteristics determined by the reporting laboratory.  This laboratory is certified under the Clinical Laboratory Improvement Amendments CLIA as qualified to perform high complexity clinical laboratory testing.    CIPROFLOXACIN Value in next row Resistant      >=32 RESISTANTThis is a modified FDA-approved test that has been validated and its performance characteristics determined by the reporting laboratory.  This laboratory is certified under the Clinical Laboratory Improvement Amendments CLIA as qualified to perform high complexity clinical laboratory testing.    GENTAMICIN Value in next row Sensitive      >=32 RESISTANTThis is a modified FDA-approved test  that has been validated and its performance characteristics determined by the reporting laboratory.  This laboratory is certified under the Clinical Laboratory Improvement Amendments CLIA as qualified to perform high complexity clinical laboratory testing.    NITROFURANTOIN Value in next row Sensitive      >=32 RESISTANTThis is a modified FDA-approved test that has been validated and its performance characteristics determined by the reporting laboratory.  This laboratory is certified under the Clinical Laboratory Improvement Amendments CLIA as qualified to perform high complexity clinical laboratory testing.    TRIMETH/SULFA Value in next row Resistant      >=32 RESISTANTThis is a modified FDA-approved test that has been validated and its performance characteristics determined by the reporting laboratory.  This laboratory is certified under the Clinical Laboratory  Improvement Amendments CLIA as qualified to perform high complexity clinical laboratory testing.    AMPICILLIN/SULBACTAM Value in next row Resistant      >=32 RESISTANTThis is a modified FDA-approved test that has been validated and its performance characteristics determined by the reporting laboratory.  This laboratory is certified under the Clinical Laboratory Improvement Amendments CLIA as qualified to perform high complexity clinical laboratory testing.    PIP/TAZO Value in next row Sensitive ug/mL     8 SENSITIVEThis is a modified FDA-approved test that has been validated and its performance characteristics determined by the reporting laboratory.  This laboratory is certified under the Clinical Laboratory Improvement Amendments CLIA as qualified to perform high complexity clinical laboratory testing.    MEROPENEM  Value in next row Sensitive      8 SENSITIVEThis is a modified FDA-approved test that has been validated and its performance characteristics determined by the reporting laboratory.  This laboratory is certified under the Clinical Laboratory Improvement Amendments CLIA as qualified to perform high complexity clinical laboratory testing.    * >=100,000 COLONIES/mL ESCHERICHIA COLI  MRSA Next Gen by PCR, Nasal     Status: None   Collection Time: 09/10/24  3:10 PM   Specimen: Nasal Mucosa; Nasal Swab  Result Value Ref Range Status   MRSA by PCR Next Gen NOT DETECTED NOT DETECTED Final    Comment: (NOTE) The GeneXpert MRSA Assay (FDA approved for NASAL specimens only), is one component of a comprehensive MRSA colonization surveillance program. It is not intended to diagnose MRSA infection nor to guide or monitor treatment for MRSA infections. Test performance is not FDA approved in patients less than 31 years old. Performed at [facility], [address], [place], [place] [address]   Culture, blood (Routine X 2) w Reflex to ID Panel     Status: None (Preliminary result)   Collection  Time: 09/10/24  3:34 PM   Specimen: BLOOD  Result Value Ref Range Status   Specimen Description BLOOD LEFT ANTECUBITAL  Final   Special Requests   Final    BOTTLES DRAWN AEROBIC ONLY Blood Culture adequate volume   Culture   Final    NO GROWTH 2 DAYS Performed at [facility], [address], [place], [place] [address]    Report Status PENDING  Incomplete  Culture, blood (Routine X 2) w Reflex to ID Panel     Status: None (Preliminary result)   Collection Time: 09/10/24  3:34 PM   Specimen: BLOOD  Result Value Ref Range Status   Specimen Description BLOOD BLOOD LEFT HAND  Final   Special Requests   Final    BOTTLES DRAWN AEROBIC ONLY Blood Culture results may not be optimal due to an inadequate  volume of blood received in culture bottles   Culture   Final    NO GROWTH 2 DAYS Performed at [facility], [address], [place], [place] [address]    Report Status PENDING  Incomplete     Radiology Studies: DG CHEST PORT 1 VIEW Result Date: 09/11/2024 CLINICAL DATA:  Hypotension. EXAM: PORTABLE CHEST 1 VIEW COMPARISON:  September 23, 2023. FINDINGS: Stable cardiomediastinal silhouette. Minimal bibasilar subsegmental atelectasis or scarring is noted. Bony thorax is unremarkable. IMPRESSION: Minimal bibasilar subsegmental atelectasis or scarring. Electronically Signed   By: Lynwood Landy Raddle M.D.   On: 09/11/2024 11:24   CT ABDOMEN PELVIS WO CONTRAST Result Date: 09/10/2024 CLINICAL DATA:  Acute kidney injury assess for intra-abdominal infection EXAM: CT ABDOMEN AND PELVIS WITHOUT CONTRAST TECHNIQUE: Multidetector CT imaging of the abdomen and pelvis was performed following the standard protocol without IV contrast. RADIATION DOSE REDUCTION: This exam was performed according to the departmental dose-optimization program which includes automated exposure control, adjustment of the mA and/or kV according to patient size and/or use of iterative reconstruction technique. COMPARISON:  None Available.  FINDINGS: Lower chest: Lung bases demonstrate no acute airspace disease. Atelectasis at the lung bases. Multi-vessel coronary vascular calcification. Hepatobiliary: Motion degradation. Gallstones. No biliary dilatation. Pancreas: Unremarkable. No pancreatic ductal dilatation or surrounding inflammatory changes. Spleen: Normal in size without focal abnormality. Adrenals/Urinary Tract: Adrenal glands are normal. Kidneys show no hydronephrosis. Multiple cysts in the left kidney, no imaging follow-up is recommended. The bladder is unremarkable Stomach/Bowel: The stomach is within normal limits. No dilated small bowel. No acute bowel wall thickening. Negative appendix. Mild diverticular disease of the left colon Vascular/Lymphatic: Aortic atherosclerosis. No enlarged abdominal or pelvic lymph nodes. Reproductive: Hysterectomy.  No adnexal mass Other: Negative for pelvic effusion or free air. Moderate fat containing periumbilical hernia Musculoskeletal: No acute or suspicious osseous abnormality IMPRESSION: 1. No CT evidence for acute intra-abdominal or pelvic abnormality allowing for motion degradation. 2. Gallstones. 3. Mild diverticular disease of the left colon without acute inflammatory process. 4. Aortic atherosclerosis. Aortic Atherosclerosis (ICD10-I70.0). Electronically Signed   By: Luke Bun M.D.   On: 09/10/2024 23:44    Scheduled Meds:  allopurinol   300 mg Oral Daily   Chlorhexidine  Gluconate Cloth  6 each Topical Daily   clopidogrel   75 mg Oral q AM   divalproex   375 mg Oral TID   heparin   5,000 Units Subcutaneous Q8H   insulin  aspart  0-9 Units Subcutaneous TID WC   melatonin  6 mg Oral QHS   metoprolol  tartrate  5 mg Intravenous Q6H   mirtazapine   15 mg Oral QHS   rosuvastatin   10 mg Oral QPM   Continuous Infusions:  dextrose  5 % and 0.45 % NaCl 125 mL/hr at 09/12/24 1052   meropenem  (MERREM ) IV Stopped (09/12/24 0148)   norepinephrine  (LEVOPHED ) Adult infusion 6 mcg/min (09/12/24  1052)   vancomycin        LOS: 2 days   Critical Care Procedure Note Authorized and Performed by: KYM Louder MD  Total Critical Care time:  55 mins Due to a high probability of clinically significant, life threatening deterioration, the patient required my highest level of preparedness to intervene emergently and I personally spent this critical care time directly and personally managing the patient.  This critical care time included obtaining a history; examining the patient, pulse oximetry; ordering and review of studies; arranging urgent treatment with development of a management plan; evaluation of patient's response of treatment; frequent reassessment; and discussions  with other providers.  This critical care time was performed to assess and manage the high probability of imminent and life threatening deterioration that could result in multi-organ failure.  It was exclusive of separately billable procedures and treating other patients and teaching time.    Afton Louder, MD How to contact the [facility] Attending or Consulting provider 7A - 7P or covering provider during after hours 7P -7A, for this patient?  Check the care team in [facility] and look for a) attending/consulting [facility] provider listed and b) the [facility] team listed Log into [email or web address] to find provider on call.  Locate the [facility] provider you are looking for under [facility] and page to a number that you can be directly reached. If you still have difficulty reaching the provider, please page the [facility] (Director on Call) for the Hospitalists listed on amion for assistance.  09/12/2024, 12:20 PM

## 2024-09-13 DIAGNOSIS — G9349 Other encephalopathy: Secondary | ICD-10-CM | POA: Diagnosis not present

## 2024-09-13 DIAGNOSIS — N179 Acute kidney failure, unspecified: Secondary | ICD-10-CM | POA: Diagnosis not present

## 2024-09-13 DIAGNOSIS — N390 Urinary tract infection, site not specified: Secondary | ICD-10-CM | POA: Diagnosis not present

## 2024-09-13 LAB — CBC WITH DIFFERENTIAL/PLATELET
Abs Immature Granulocytes: 0.12 K/uL — ABNORMAL HIGH (ref 0.00–0.07)
Basophils Absolute: 0 K/uL (ref 0.0–0.1)
Basophils Relative: 0 %
Eosinophils Absolute: 0.3 K/uL (ref 0.0–0.5)
Eosinophils Relative: 3 %
HCT: 30.7 % — ABNORMAL LOW (ref 36.0–46.0)
Hemoglobin: 9.5 g/dL — ABNORMAL LOW (ref 12.0–15.0)
Immature Granulocytes: 1 %
Lymphocytes Relative: 20 %
Lymphs Abs: 2.2 K/uL (ref 0.7–4.0)
MCH: 29.6 pg (ref 26.0–34.0)
MCHC: 30.9 g/dL (ref 30.0–36.0)
MCV: 95.6 fL (ref 80.0–100.0)
Monocytes Absolute: 0.6 K/uL (ref 0.1–1.0)
Monocytes Relative: 5 %
Neutro Abs: 7.9 K/uL — ABNORMAL HIGH (ref 1.7–7.7)
Neutrophils Relative %: 71 %
Platelets: 79 K/uL — ABNORMAL LOW (ref 150–400)
RBC: 3.21 MIL/uL — ABNORMAL LOW (ref 3.87–5.11)
RDW: 16.9 % — ABNORMAL HIGH (ref 11.5–15.5)
WBC: 11.1 K/uL — ABNORMAL HIGH (ref 4.0–10.5)
nRBC: 0 % (ref 0.0–0.2)

## 2024-09-13 LAB — GLUCOSE, CAPILLARY
Glucose-Capillary: 132 mg/dL — ABNORMAL HIGH (ref 70–99)
Glucose-Capillary: 113 mg/dL — ABNORMAL HIGH (ref 70–99)
Glucose-Capillary: 141 mg/dL — ABNORMAL HIGH (ref 70–99)
Glucose-Capillary: 125 mg/dL — ABNORMAL HIGH (ref 70–99)

## 2024-09-13 LAB — BASIC METABOLIC PANEL WITH GFR
Anion gap: 9 (ref 5–15)
BUN: 23 mg/dL (ref 8–23)
CO2: 22 mmol/L (ref 22–32)
Calcium: 8.3 mg/dL — ABNORMAL LOW (ref 8.9–10.3)
Chloride: 108 mmol/L (ref 98–111)
Creatinine, Ser: 1.28 mg/dL — ABNORMAL HIGH (ref 0.44–1.00)
GFR, Estimated: 42 mL/min — ABNORMAL LOW (ref >60)
Glucose, Bld: 154 mg/dL — ABNORMAL HIGH (ref 70–99)
Potassium: 3.4 mmol/L — ABNORMAL LOW (ref 3.5–5.1)
Sodium: 139 mmol/L (ref 135–145)

## 2024-09-13 LAB — PROCALCITONIN: Procalcitonin: 2.29 ng/mL

## 2024-09-13 LAB — MAGNESIUM: Magnesium: 1.6 mg/dL — ABNORMAL LOW (ref 1.7–2.4)

## 2024-09-13 MED ORDER — POTASSIUM CHLORIDE 10 MEQ/100ML IV SOLN
10.0000 meq | INTRAVENOUS | Status: AC
  Administered 2024-09-13 (×4): 10 meq via INTRAVENOUS
  Filled 2024-09-13 (×4): qty 100

## 2024-09-13 MED ORDER — FENTANYL CITRATE PF 50 MCG/ML IJ SOSY: 25.0000 ug | PREFILLED_SYRINGE | INTRAMUSCULAR | Status: DC | PRN

## 2024-09-13 MED ORDER — MAGNESIUM SULFATE 4 GM/100ML IV SOLN
4.0000 g | Freq: Once | INTRAVENOUS | Status: AC
  Administered 2024-09-13: 4 g via INTRAVENOUS
  Filled 2024-09-13: qty 100

## 2024-09-13 NOTE — Progress Notes (Signed)
 PROGRESS NOTE   Katelyn Ballard  FMW:[id number] DOB: 03-25-1945 DOA: 09/09/2024 PCP: Bluford Carlin LABOR, MD   Chief Complaint  Patient presents with   Fall   Level of care: ICU  Brief Admission History:  79 y.o. female with a history of dementia, stroke CAD s/p PCI, diastolic heart failure, mitral valve regurgitation, hypertension, hyperlipidemia, diabetes mellitus type 2, CKD stage III.  Patient presented secondary to fall with altered mental status and concern for metabolic encephalopathy secondary to UTI. Empiric antibiotics started. During hospitalization, patient developed worsening hypotension, consistent with severe sepsis, and was transferred to stepdown/ICU. Goals of care discussions with palliative medicine team with expressed understanding of not escalating care.   Assessment and Plan:  Severe sepsis with septic shock  -Continue IV pressors, wean off as able, continue supportive measures  -lactic acidosis resolved with IV fluid hydration -discussed with palliative team after family meeting, no escalation of care, daughters to discuss further with sons regarding residential hospice placement.  -if no meaningful improvements over next 24 hours, plan is to transition focus of care to comfort and dignity  E coli UTI - multidrug resistant organism -- continue IV meropenem  as the E coli is sensitive to this  Acute metabolic encephalopathy -- remains encephalopathic but slowly improving -- monitor for signs of meaningful recovery   Hypokalemia -- IV replacement ordered  Hypomagnesemia -- IV replacement ordered   Hypernatremia --resolved with increased free water delivery  Leukocytosis -- WBC slowly trending down -- treating as above  -- CBC with diff in AM   AKI on CKD stage 3a -- slowly improving with IV fluid hydration -- recheck in AM   Type 2 DM  -- continue SSI coverage and frequent CBG monitoring CBG (last 3)  Recent Labs    09/12/24 2210 09/13/24 0728  09/13/24 1134  GLUCAP 192* 132* 113*   DVT prophylaxis: sq hep Code Status: DNR  DNI Family Communication: family meeting with palliative  Disposition:anticipating residential hospice if no meaningful improvement   Consultants:  Palliative care   Subjective: Pt having painful right knee, painful to extend right leg    Objective: Vitals:   09/13/24 1000 09/13/24 1030 09/13/24 1136 09/13/24 1204  BP: 124/64 133/70  (!) 147/93  Pulse: 72 78    Resp: (!) 24 (!) 22    Temp:   97.6 F (36.4 C)   TempSrc:   Oral   SpO2: 100% 96%    Weight:      Height:        Intake/Output Summary (Last 24 hours) at 09/13/2024 1234 Last data filed at 09/13/2024 1136 Gross per 24 hour  Intake 1154.2 ml  Output 1650 ml  Net -495.8 ml   Filed Weights   09/09/24 1706  Weight: 70.3 kg   Examination:  General exam: Appears chronically ill and encephalpathic, large bruise around right eye socket. Lying in fetal position.   Respiratory system: Clear to auscultation. Respiratory effort normal. Cardiovascular system: normal S1 & S2 heard. No JVD, murmurs, rubs, gallops or clicks. No pedal edema. Gastrointestinal system: Abdomen is nondistended, soft and nontender. No organomegaly or masses felt. Normal bowel sounds heard. Central nervous system: alert but disoriented. No focal neurological deficits. Extremities: right knee painful to touch, crepitus bone on bone feeling, painful extension of right leg Skin: No rashes, lesions or ulcers. Psychiatry: Judgement and insight appear severely diminished.  Data Reviewed: I have personally reviewed following labs and imaging studies  CBC: Recent Labs  Lab 09/09/24  1932 09/10/24 0525 09/11/24 0613 09/12/24 0538 09/13/24 0544  WBC 9.2 9.4 25.0* 18.3* 11.1*  NEUTROABS 3.9  --   --  15.1* 7.9*  HGB 11.8* 12.3 9.9* 9.7* 9.5*  HCT 38.5 40.2 31.7* 31.5* 30.7*  MCV 95.3 96.6 96.1 96.6 95.6  PLT 108* 118* 98* 93* 79*    Basic Metabolic Panel: Recent  Labs  Lab 09/09/24 1932 09/10/24 0525 09/11/24 0613 09/12/24 0538 09/13/24 0544  NA 145 147* 139 140 139  K 3.5 3.2* 4.0 3.7 3.4*  CL 106 109 108 111 108  CO2 27 24 23 22 22   GLUCOSE 78 82 151* 161* 154*  BUN 37* 33* 39* 32* 23  CREATININE 1.71* 1.55* 1.96* 1.47* 1.28*  CALCIUM  8.8* 8.4* 8.0* 8.2* 8.3*  MG  --  1.8  --   --  1.6*  PHOS  --  2.9  --   --   --     CBG: Recent Labs  Lab 09/12/24 1101 09/12/24 1652 09/12/24 2210 09/13/24 0728 09/13/24 1134  GLUCAP 152* 138* 192* 132* 113*    Recent Results (from the past 240 hours)  Urine Culture     Status: Abnormal   Collection Time: 09/09/24  8:00 PM   Specimen: Urine, Clean Catch  Result Value Ref Range Status   Specimen Description   Final    URINE, CLEAN CATCH Performed at [facility], [address], [place], [place] [address]    Special Requests   Final    NONE Performed at [facility], [address], [place], [place] [address]    Culture (A)  Final    >=100,000 COLONIES/mL ESCHERICHIA COLI Confirmed Extended Spectrum Beta-Lactamase Producer (ESBL).  In bloodstream infections from ESBL organisms, carbapenems are preferred over piperacillin/tazobactam. They are shown to have a lower risk of mortality.    Report Status 09/12/2024 FINAL  Final   Organism ID, Bacteria ESCHERICHIA COLI (A)  Final      Susceptibility   Escherichia coli - MIC*    AMPICILLIN >=32 RESISTANT Resistant     CEFAZOLIN (URINE) Value in next row Resistant      >=32 RESISTANTThis is a modified FDA-approved test that has been validated and its performance characteristics determined by the reporting laboratory.  This laboratory is certified under the Clinical Laboratory Improvement Amendments CLIA as qualified to perform high complexity clinical laboratory testing.    CEFEPIME  Value in next row Resistant      >=32 RESISTANTThis is a modified FDA-approved test that has been validated and its performance characteristics determined by the  reporting laboratory.  This laboratory is certified under the Clinical Laboratory Improvement Amendments CLIA as qualified to perform high complexity clinical laboratory testing.    ERTAPENEM Value in next row Sensitive      >=32 RESISTANTThis is a modified FDA-approved test that has been validated and its performance characteristics determined by the reporting laboratory.  This laboratory is certified under the Clinical Laboratory Improvement Amendments CLIA as qualified to perform high complexity clinical laboratory testing.    CEFTRIAXONE  Value in next row Resistant      >=32 RESISTANTThis is a modified FDA-approved test that has been validated and its performance characteristics determined by the reporting laboratory.  This laboratory is certified under the Clinical Laboratory Improvement Amendments CLIA as qualified to perform high complexity clinical laboratory testing.    CIPROFLOXACIN Value in next row Resistant      >=32 RESISTANTThis is a modified FDA-approved test that has been validated and  its performance characteristics determined by the reporting laboratory.  This laboratory is certified under the Clinical Laboratory Improvement Amendments CLIA as qualified to perform high complexity clinical laboratory testing.    GENTAMICIN Value in next row Sensitive      >=32 RESISTANTThis is a modified FDA-approved test that has been validated and its performance characteristics determined by the reporting laboratory.  This laboratory is certified under the Clinical Laboratory Improvement Amendments CLIA as qualified to perform high complexity clinical laboratory testing.    NITROFURANTOIN Value in next row Sensitive      >=32 RESISTANTThis is a modified FDA-approved test that has been validated and its performance characteristics determined by the reporting laboratory.  This laboratory is certified under the Clinical Laboratory Improvement Amendments CLIA as qualified to perform high complexity  clinical laboratory testing.    TRIMETH/SULFA Value in next row Resistant      >=32 RESISTANTThis is a modified FDA-approved test that has been validated and its performance characteristics determined by the reporting laboratory.  This laboratory is certified under the Clinical Laboratory Improvement Amendments CLIA as qualified to perform high complexity clinical laboratory testing.    AMPICILLIN/SULBACTAM Value in next row Resistant      >=32 RESISTANTThis is a modified FDA-approved test that has been validated and its performance characteristics determined by the reporting laboratory.  This laboratory is certified under the Clinical Laboratory Improvement Amendments CLIA as qualified to perform high complexity clinical laboratory testing.    PIP/TAZO Value in next row Sensitive ug/mL     8 SENSITIVEThis is a modified FDA-approved test that has been validated and its performance characteristics determined by the reporting laboratory.  This laboratory is certified under the Clinical Laboratory Improvement Amendments CLIA as qualified to perform high complexity clinical laboratory testing.    MEROPENEM  Value in next row Sensitive      8 SENSITIVEThis is a modified FDA-approved test that has been validated and its performance characteristics determined by the reporting laboratory.  This laboratory is certified under the Clinical Laboratory Improvement Amendments CLIA as qualified to perform high complexity clinical laboratory testing.    * >=100,000 COLONIES/mL ESCHERICHIA COLI  MRSA Next Gen by PCR, Nasal     Status: None   Collection Time: 09/10/24  3:10 PM   Specimen: Nasal Mucosa; Nasal Swab  Result Value Ref Range Status   MRSA by PCR Next Gen NOT DETECTED NOT DETECTED Final    Comment: (NOTE) The GeneXpert MRSA Assay (FDA approved for NASAL specimens only), is one component of a comprehensive MRSA colonization surveillance program. It is not intended to diagnose MRSA infection nor to guide or  monitor treatment for MRSA infections. Test performance is not FDA approved in patients less than 7 years old. Performed at [facility], [address], [place], [place] [address]   Culture, blood (Routine X 2) w Reflex to ID Panel     Status: None (Preliminary result)   Collection Time: 09/10/24  3:34 PM   Specimen: BLOOD  Result Value Ref Range Status   Specimen Description BLOOD LEFT ANTECUBITAL  Final   Special Requests   Final    BOTTLES DRAWN AEROBIC ONLY Blood Culture adequate volume   Culture   Final    NO GROWTH 3 DAYS Performed at [facility], [address] [place], [place] [address]    Report Status PENDING  Incomplete  Culture, blood (Routine X 2) w Reflex to ID Panel     Status: None (Preliminary result)   Collection  Time: 09/10/24  3:34 PM   Specimen: BLOOD  Result Value Ref Range Status   Specimen Description BLOOD BLOOD LEFT HAND  Final   Special Requests   Final    BOTTLES DRAWN AEROBIC ONLY Blood Culture results may not be optimal due to an inadequate volume of blood received in culture bottles   Culture   Final    NO GROWTH 3 DAYS Performed at [facility], [address], [place], [place] [address]    Report Status PENDING  Incomplete     Radiology Studies: No results found.   Scheduled Meds:  allopurinol   300 mg Oral Daily   Chlorhexidine  Gluconate Cloth  6 each Topical Daily   clopidogrel   75 mg Oral q AM   divalproex   375 mg Oral TID   heparin   5,000 Units Subcutaneous Q8H   insulin  aspart  0-9 Units Subcutaneous TID WC   melatonin  6 mg Oral QHS   metoprolol  tartrate  5 mg Intravenous Q6H   mirtazapine   15 mg Oral QHS   rosuvastatin   10 mg Oral QPM   Continuous Infusions:  meropenem  (MERREM ) IV 1 g (09/13/24 0105)   norepinephrine  (LEVOPHED ) Adult infusion Stopped (09/12/24 1742)   potassium chloride  10 mEq (09/13/24 1158)     LOS: 3 days   Critical Care Procedure Note Authorized and Performed by: KYM Louder MD  Total Critical Care  time:  55 mins Due to a high probability of clinically significant, life threatening deterioration, the patient required my highest level of preparedness to intervene emergently and I personally spent this critical care time directly and personally managing the patient.  This critical care time included obtaining a history; examining the patient, pulse oximetry; ordering and review of studies; arranging urgent treatment with development of a management plan; evaluation of patient's response of treatment; frequent reassessment; and discussions with other providers.  This critical care time was performed to assess and manage the high probability of imminent and life threatening deterioration that could result in multi-organ failure.  It was exclusive of separately billable procedures and treating other patients and teaching time.    Afton Louder, MD How to contact the [facility] Attending or Consulting provider 7A - 7P or covering provider during after hours 7P -7A, for this patient?  Check the care team in [facility] and look for a) attending/consulting [facility] provider listed and b) the [facility] team listed Log into [email or web address] to find provider on call.  Locate the [facility] provider you are looking for under [facility] and page to a number that you can be directly reached. If you still have difficulty reaching the provider, please page the [facility] (Director on Call) for the Hospitalists listed on amion for assistance.  09/13/2024, 12:34 PM

## 2024-09-13 NOTE — Plan of Care (Signed)
  Problem: Clinical Measurements: Goal: Ability to maintain clinical measurements within normal limits will improve Outcome: Progressing Goal: Diagnostic test results will improve Outcome: Progressing Goal: Respiratory complications will improve Outcome: Progressing Goal: Cardiovascular complication will be avoided Outcome: Progressing   Problem: Nutrition: Goal: Adequate nutrition will be maintained Outcome: Progressing   Problem: Elimination: Goal: Will not experience complications related to bowel motility Outcome: Progressing Goal: Will not experience complications related to urinary retention Outcome: Progressing

## 2024-09-14 DIAGNOSIS — N390 Urinary tract infection, site not specified: Secondary | ICD-10-CM | POA: Diagnosis not present

## 2024-09-14 DIAGNOSIS — Z7189 Other specified counseling: Secondary | ICD-10-CM | POA: Diagnosis not present

## 2024-09-14 DIAGNOSIS — B9620 Unspecified Escherichia coli [E. coli] as the cause of diseases classified elsewhere: Secondary | ICD-10-CM | POA: Diagnosis not present

## 2024-09-14 DIAGNOSIS — R7881 Bacteremia: Secondary | ICD-10-CM | POA: Diagnosis not present

## 2024-09-14 DIAGNOSIS — Z515 Encounter for palliative care: Secondary | ICD-10-CM

## 2024-09-14 DIAGNOSIS — Z558 Other problems related to education and literacy: Secondary | ICD-10-CM | POA: Diagnosis not present

## 2024-09-14 DIAGNOSIS — N179 Acute kidney failure, unspecified: Secondary | ICD-10-CM | POA: Diagnosis not present

## 2024-09-14 LAB — GLUCOSE, CAPILLARY
Glucose-Capillary: 113 mg/dL — ABNORMAL HIGH (ref 70–99)
Glucose-Capillary: 101 mg/dL — ABNORMAL HIGH (ref 70–99)
Glucose-Capillary: 161 mg/dL — ABNORMAL HIGH (ref 70–99)
Glucose-Capillary: 152 mg/dL — ABNORMAL HIGH (ref 70–99)
Glucose-Capillary: 138 mg/dL — ABNORMAL HIGH (ref 70–99)

## 2024-09-14 LAB — CBC WITH DIFFERENTIAL/PLATELET
Abs Immature Granulocytes: 0.06 K/uL (ref 0.00–0.07)
Basophils Absolute: 0 K/uL (ref 0.0–0.1)
Basophils Relative: 1 %
Eosinophils Absolute: 0.3 K/uL (ref 0.0–0.5)
Eosinophils Relative: 3 %
HCT: 31.5 % — ABNORMAL LOW (ref 36.0–46.0)
Hemoglobin: 9.5 g/dL — ABNORMAL LOW (ref 12.0–15.0)
Immature Granulocytes: 1 %
Lymphocytes Relative: 30 %
Lymphs Abs: 2.5 K/uL (ref 0.7–4.0)
MCH: 28.7 pg (ref 26.0–34.0)
MCHC: 30.2 g/dL (ref 30.0–36.0)
MCV: 95.2 fL (ref 80.0–100.0)
Monocytes Absolute: 0.8 K/uL (ref 0.1–1.0)
Monocytes Relative: 9 %
Neutro Abs: 4.7 K/uL (ref 1.7–7.7)
Neutrophils Relative %: 56 %
Platelets: 91 K/uL — ABNORMAL LOW (ref 150–400)
RBC: 3.31 MIL/uL — ABNORMAL LOW (ref 3.87–5.11)
RDW: 17 % — ABNORMAL HIGH (ref 11.5–15.5)
WBC: 8.3 K/uL (ref 4.0–10.5)
nRBC: 0 % (ref 0.0–0.2)

## 2024-09-14 LAB — BASIC METABOLIC PANEL WITH GFR
Anion gap: 4 — ABNORMAL LOW (ref 5–15)
BUN: 21 mg/dL (ref 8–23)
CO2: 23 mmol/L (ref 22–32)
Calcium: 8.5 mg/dL — ABNORMAL LOW (ref 8.9–10.3)
Chloride: 116 mmol/L — ABNORMAL HIGH (ref 98–111)
Creatinine, Ser: 1.06 mg/dL — ABNORMAL HIGH (ref 0.44–1.00)
GFR, Estimated: 53 mL/min — ABNORMAL LOW (ref >60)
Glucose, Bld: 85 mg/dL (ref 70–99)
Potassium: 4.3 mmol/L (ref 3.5–5.1)
Sodium: 143 mmol/L (ref 135–145)

## 2024-09-14 LAB — MAGNESIUM: Magnesium: 2.4 mg/dL (ref 1.7–2.4)

## 2024-09-14 MED ORDER — SODIUM CHLORIDE 0.9 % IV BOLUS
250.0000 mL | Freq: Once | INTRAVENOUS | Status: AC
  Administered 2024-09-14: 250 mL via INTRAVENOUS

## 2024-09-14 NOTE — Progress Notes (Signed)
                                                                                                                                                          Daily Progress Note   Patient Name: Katelyn Ballard       Date: 09/14/2024 DOB: August 02, 1945  Age: 79 y.o. MRN#: [id number] Attending Physician: Vicci Afton CROME, MD Primary Care Physician: Bluford Carlin LABOR, MD Admit Date: 09/09/2024  Reason for Consultation/Follow-up: {Reason for Consult:23484}  Subjective: ***  Today, ***  Chart review/care coordination:  Completed extensive chart review including EPIC notes, ***. Coordinated care with ****.    Length of Stay: 4   Physical Exam          Vital Signs: BP (!) 135/98 (BP Location: Right Arm)   Pulse (!) 53   Temp 97.7 F (36.5 C) (Oral)   Resp (!) 21   Ht 5' 8 (1.727 m)   Wt 70.3 kg   SpO2 93%   BMI 23.57 kg/m  SpO2: SpO2: 93 % O2 Device: O2 Device: Room Air O2 Flow Rate: O2 Flow Rate (L/min): 2 L/min      Palliative Assessment/Data:   Palliative Care Assessment & Plan   Patient Profile/Assessment:  ***   Recommendations/Plan: ***   Symptom management:  ***  Prognosis:  {Palliative Care Prognosis:23504}    Discharge Planning: {Palliative dispostion:23505}    Detailed review of medical records (labs, imaging, vital signs), medically appropriate exam, discussed with treatment team, counseling and education to patient, family, & staff, documenting clinical information, medication management, coordination of care   Total time: I spent *** minutes in the care of the patient today in the above activities and documenting the encounter.   Billing based on MDM: ***  {Problems Addressed:304933}  {Amount and/or Complexity of Ijuj:695065}  {Risks:304936}         [name], NP  Palliative Medicine Team Team phone # [phone]  Thank you for allowing the Palliative Medicine Team to assist in the care of this patient. Please utilize  secure chat with additional questions, if there is no response within 30 minutes please call the above phone number.  Palliative Medicine Team providers are available by phone from 7am to 7pm daily and can be reached through the team cell phone.  Should this patient require assistance outside of these hours, please call the patient's attending physician.

## 2024-09-14 NOTE — Progress Notes (Signed)
 PROGRESS NOTE   Katelyn Ballard  FMW:[id number] DOB: [birth year] DOA: 09/09/2024 PCP: Bluford Carlin LABOR, MD   Chief Complaint  Patient presents with   Fall   Level of care: Med-Surg  Brief Admission History:  79 y.o. female with a history of dementia, stroke CAD s/p PCI, diastolic heart failure, mitral valve regurgitation, hypertension, hyperlipidemia, diabetes mellitus type 2, CKD stage III.  Patient presented secondary to fall with altered mental status and concern for metabolic encephalopathy secondary to UTI. Empiric antibiotics started. During hospitalization, patient developed worsening hypotension, consistent with severe sepsis, and was transferred to stepdown/ICU. Goals of care discussions with palliative medicine team with expressed understanding of not escalating care.   Assessment and Plan:  Severe sepsis with septic shock  -Continue IV pressors, wean off as able, continue supportive measures  -lactic acidosis resolved with IV fluid hydration -discussed with palliative team after family meeting, no escalation of care, daughters to discuss further with sons regarding residential hospice placement.  -if no meaningful improvements over next 24 hours, plan is to transition focus of care to comfort and dignity  E coli UTI - multidrug resistant organism -- continue IV meropenem  as the E coli is sensitive to this -- planning 7 days of meropenem  for uncomplicated E. coli bacteremia in hospitalized patients who have achieved clinical stability and source control, with consideration for oral step-down therapy if susceptibility and clinical response allow  Acute metabolic encephalopathy -- improving  -- remains encephalopathic but slowly improving -- monitor for signs of meaningful recovery   Hypokalemia -- IV replacement ordered  Hypomagnesemia -- IV replacement ordered   Hypernatremia --resolved with increased free water delivery  Leukocytosis -- WBC slowly trending down --  treating as above  -- CBC with diff in AM   AKI on CKD stage 3a -- slowly improving with IV fluid hydration -- recheck in AM   Type 2 DM  -- continue SSI coverage and frequent CBG monitoring CBG (last 3)  Recent Labs    09/14/24 0738 09/14/24 1133 09/14/24 1645  GLUCAP 101* 161* 152*   DVT prophylaxis: sq hep Code Status: DNR  DNI Family Communication: daughter Disposition:anticipating return to [facility] with hospice support   Consultants:  Palliative care   Subjective: Less confused today and less painful knee pain today    Objective: Vitals:   09/14/24 1100 09/14/24 1200 09/14/24 1201 09/14/24 1513  BP: 138/75 137/68 137/68 (!) 135/98  Pulse: 76 81 77 (!) 53  Resp: 19  (!) 21   Temp:   97.8 F (36.6 C) 97.7 F (36.5 C)  TempSrc:   Oral Oral  SpO2: 97%  100% 93%  Weight:      Height:    5' 8 (1.727 m)    Intake/Output Summary (Last 24 hours) at 09/14/2024 1652 Last data filed at 09/14/2024 0809 Gross per 24 hour  Intake 748.74 ml  Output 700 ml  Net 48.74 ml   Filed Weights   09/09/24 1706  Weight: 70.3 kg   Examination:  General exam: Appears to be improved; large bruise around right eye socket.   Respiratory system: Clear to auscultation. Respiratory effort normal. Cardiovascular system: normal S1 & S2 heard. No JVD, murmurs, rubs, gallops or clicks. No pedal edema. Gastrointestinal system: Abdomen is nondistended, soft and nontender. No organomegaly or masses felt. Normal bowel sounds heard. Central nervous system: alert but disoriented. No focal neurological deficits. Extremities: right knee painful to touch, crepitus bone on bone feeling, painful extension of  right leg Skin: No rashes, lesions or ulcers. Psychiatry: Judgement and insight appear severely diminished.  Data Reviewed: I have personally reviewed following labs and imaging studies  CBC: Recent Labs  Lab 09/09/24 1932 09/10/24 0525 09/11/24 0613 09/12/24 0538 09/13/24 0544  09/14/24 0512  WBC 9.2 9.4 25.0* 18.3* 11.1* 8.3  NEUTROABS 3.9  --   --  15.1* 7.9* 4.7  HGB 11.8* 12.3 9.9* 9.7* 9.5* 9.5*  HCT 38.5 40.2 31.7* 31.5* 30.7* 31.5*  MCV 95.3 96.6 96.1 96.6 95.6 95.2  PLT 108* 118* 98* 93* 79* 91*    Basic Metabolic Panel: Recent Labs  Lab 09/10/24 0525 09/11/24 0613 09/12/24 0538 09/13/24 0544 09/14/24 0512  NA 147* 139 140 139 143  K 3.2* 4.0 3.7 3.4* 4.3  CL 109 108 111 108 116*  CO2 24 23 22 22 23   GLUCOSE 82 151* 161* 154* 85  BUN 33* 39* 32* 23 21  CREATININE 1.55* 1.96* 1.47* 1.28* 1.06*  CALCIUM  8.4* 8.0* 8.2* 8.3* 8.5*  MG 1.8  --   --  1.6* 2.4  PHOS 2.9  --   --   --   --     CBG: Recent Labs  Lab 09/13/24 2246 09/14/24 0713 09/14/24 0738 09/14/24 1133 09/14/24 1645  GLUCAP 125* 113* 101* 161* 152*    Recent Results (from the past 240 hours)  Urine Culture     Status: Abnormal   Collection Time: 09/09/24  8:00 PM   Specimen: Urine, Clean Catch  Result Value Ref Range Status   Specimen Description   Final    URINE, CLEAN CATCH Performed at [facility], [address], [place], [place] [address]    Special Requests   Final    NONE Performed at [facility], [address], [place], [place] [address]    Culture (A)  Final    >=100,000 COLONIES/mL ESCHERICHIA COLI Confirmed Extended Spectrum Beta-Lactamase Producer (ESBL).  In bloodstream infections from ESBL organisms, carbapenems are preferred over piperacillin/tazobactam. They are shown to have a lower risk of mortality.    Report Status 09/12/2024 FINAL  Final   Organism ID, Bacteria ESCHERICHIA COLI (A)  Final      Susceptibility   Escherichia coli - MIC*    AMPICILLIN >=32 RESISTANT Resistant     CEFAZOLIN (URINE) Value in next row Resistant      >=32 RESISTANTThis is a modified FDA-approved test that has been validated and its performance characteristics determined by the reporting laboratory.  This laboratory is certified under the Clinical Laboratory  Improvement Amendments CLIA as qualified to perform high complexity clinical laboratory testing.    CEFEPIME  Value in next row Resistant      >=32 RESISTANTThis is a modified FDA-approved test that has been validated and its performance characteristics determined by the reporting laboratory.  This laboratory is certified under the Clinical Laboratory Improvement Amendments CLIA as qualified to perform high complexity clinical laboratory testing.    ERTAPENEM Value in next row Sensitive      >=32 RESISTANTThis is a modified FDA-approved test that has been validated and its performance characteristics determined by the reporting laboratory.  This laboratory is certified under the Clinical Laboratory Improvement Amendments CLIA as qualified to perform high complexity clinical laboratory testing.    CEFTRIAXONE  Value in next row Resistant      >=32 RESISTANTThis is a modified FDA-approved test that has been validated and its performance characteristics determined by the reporting laboratory.  This laboratory is certified under the  Clinical Laboratory Improvement Amendments CLIA as qualified to perform high complexity clinical laboratory testing.    CIPROFLOXACIN Value in next row Resistant      >=32 RESISTANTThis is a modified FDA-approved test that has been validated and its performance characteristics determined by the reporting laboratory.  This laboratory is certified under the Clinical Laboratory Improvement Amendments CLIA as qualified to perform high complexity clinical laboratory testing.    GENTAMICIN Value in next row Sensitive      >=32 RESISTANTThis is a modified FDA-approved test that has been validated and its performance characteristics determined by the reporting laboratory.  This laboratory is certified under the Clinical Laboratory Improvement Amendments CLIA as qualified to perform high complexity clinical laboratory testing.    NITROFURANTOIN Value in next row Sensitive      >=32  RESISTANTThis is a modified FDA-approved test that has been validated and its performance characteristics determined by the reporting laboratory.  This laboratory is certified under the Clinical Laboratory Improvement Amendments CLIA as qualified to perform high complexity clinical laboratory testing.    TRIMETH/SULFA Value in next row Resistant      >=32 RESISTANTThis is a modified FDA-approved test that has been validated and its performance characteristics determined by the reporting laboratory.  This laboratory is certified under the Clinical Laboratory Improvement Amendments CLIA as qualified to perform high complexity clinical laboratory testing.    AMPICILLIN/SULBACTAM Value in next row Resistant      >=32 RESISTANTThis is a modified FDA-approved test that has been validated and its performance characteristics determined by the reporting laboratory.  This laboratory is certified under the Clinical Laboratory Improvement Amendments CLIA as qualified to perform high complexity clinical laboratory testing.    PIP/TAZO Value in next row Sensitive ug/mL     8 SENSITIVEThis is a modified FDA-approved test that has been validated and its performance characteristics determined by the reporting laboratory.  This laboratory is certified under the Clinical Laboratory Improvement Amendments CLIA as qualified to perform high complexity clinical laboratory testing.    MEROPENEM  Value in next row Sensitive      8 SENSITIVEThis is a modified FDA-approved test that has been validated and its performance characteristics determined by the reporting laboratory.  This laboratory is certified under the Clinical Laboratory Improvement Amendments CLIA as qualified to perform high complexity clinical laboratory testing.    * >=100,000 COLONIES/mL ESCHERICHIA COLI  MRSA Next Gen by PCR, Nasal     Status: None   Collection Time: 09/10/24  3:10 PM   Specimen: Nasal Mucosa; Nasal Swab  Result Value Ref Range Status   MRSA  by PCR Next Gen NOT DETECTED NOT DETECTED Final    Comment: (NOTE) The GeneXpert MRSA Assay (FDA approved for NASAL specimens only), is one component of a comprehensive MRSA colonization surveillance program. It is not intended to diagnose MRSA infection nor to guide or monitor treatment for MRSA infections. Test performance is not FDA approved in patients less than 41 years old. Performed at [facility], [address], [place], [place] [address]   Culture, blood (Routine X 2) w Reflex to ID Panel     Status: None (Preliminary result)   Collection Time: 09/10/24  3:34 PM   Specimen: BLOOD  Result Value Ref Range Status   Specimen Description BLOOD LEFT ANTECUBITAL  Final   Special Requests   Final    BOTTLES DRAWN AEROBIC ONLY Blood Culture adequate volume   Culture   Final    NO GROWTH 4 DAYS Performed  at [facility], [address] [place], [place] [address]    Report Status PENDING  Incomplete  Culture, blood (Routine X 2) w Reflex to ID Panel     Status: None (Preliminary result)   Collection Time: 09/10/24  3:34 PM   Specimen: BLOOD  Result Value Ref Range Status   Specimen Description BLOOD BLOOD LEFT HAND  Final   Special Requests   Final    BOTTLES DRAWN AEROBIC ONLY Blood Culture results may not be optimal due to an inadequate volume of blood received in culture bottles   Culture   Final    NO GROWTH 4 DAYS Performed at [facility], [address], [place], [place] [address]    Report Status PENDING  Incomplete     Radiology Studies: No results found.   Scheduled Meds:  allopurinol   300 mg Oral Daily   Chlorhexidine  Gluconate Cloth  6 each Topical Daily   clopidogrel   75 mg Oral q AM   divalproex   375 mg Oral TID   heparin   5,000 Units Subcutaneous Q8H   insulin  aspart  0-9 Units Subcutaneous TID WC   melatonin  6 mg Oral QHS   metoprolol  tartrate  5 mg Intravenous Q6H   mirtazapine   15 mg Oral QHS   rosuvastatin   10 mg Oral QPM   Continuous Infusions:   meropenem  (MERREM ) IV 1 g (09/14/24 1519)   norepinephrine  (LEVOPHED ) Adult infusion Stopped (09/12/24 1742)     LOS: 4 days   Time spent: 57 mins  Jameon Deller Vicci, MD How to contact the [facility] Attending or Consulting provider 7A - 7P or covering provider during after hours 7P -7A, for this patient?  Check the care team in [facility] and look for a) attending/consulting [facility] provider listed and b) the [facility] team listed Log into [email or web address] to find provider on call.  Locate the [facility] provider you are looking for under [facility] and page to a number that you can be directly reached. If you still have difficulty reaching the provider, please page the [facility] (Director on Call) for the Hospitalists listed on amion for assistance.  09/14/2024, 4:52 PM

## 2024-09-14 NOTE — Plan of Care (Signed)

## 2024-09-15 DIAGNOSIS — G9349 Other encephalopathy: Secondary | ICD-10-CM | POA: Diagnosis not present

## 2024-09-15 DIAGNOSIS — N179 Acute kidney failure, unspecified: Secondary | ICD-10-CM | POA: Diagnosis not present

## 2024-09-15 DIAGNOSIS — N390 Urinary tract infection, site not specified: Secondary | ICD-10-CM | POA: Diagnosis not present

## 2024-09-15 DIAGNOSIS — Z515 Encounter for palliative care: Secondary | ICD-10-CM | POA: Diagnosis not present

## 2024-09-15 DIAGNOSIS — Z558 Other problems related to education and literacy: Secondary | ICD-10-CM | POA: Diagnosis not present

## 2024-09-15 DIAGNOSIS — Z7189 Other specified counseling: Secondary | ICD-10-CM | POA: Diagnosis not present

## 2024-09-15 LAB — CULTURE, BLOOD (ROUTINE X 2)
Culture: NO GROWTH
Culture: NO GROWTH
Special Requests: ADEQUATE

## 2024-09-15 LAB — GLUCOSE, CAPILLARY
Glucose-Capillary: 99 mg/dL (ref 70–99)
Glucose-Capillary: 159 mg/dL — ABNORMAL HIGH (ref 70–99)
Glucose-Capillary: 119 mg/dL — ABNORMAL HIGH (ref 70–99)

## 2024-09-15 MED ORDER — METOPROLOL TARTRATE 25 MG PO TABS
12.5000 mg | ORAL_TABLET | Freq: Two times a day (BID) | ORAL | Status: DC
  Administered 2024-09-16 (×2): 12.5 mg via ORAL
  Filled 2024-09-15 (×5): qty 1

## 2024-09-15 NOTE — Progress Notes (Signed)
 PROGRESS NOTE   Katelyn Ballard  FMW:[id number] DOB: [birth year] DOA: 09/09/2024 PCP: Bluford Carlin LABOR, MD   Chief Complaint  Patient presents with   Fall   Level of care: Med-Surg  Brief Admission History:  79 y.o. female with a history of dementia, stroke CAD s/p PCI, diastolic heart failure, mitral valve regurgitation, hypertension, hyperlipidemia, diabetes mellitus type 2, CKD stage III.  Patient presented secondary to fall with altered mental status and concern for metabolic encephalopathy secondary to UTI. Empiric antibiotics started. During hospitalization, patient developed worsening hypotension, consistent with severe sepsis, and was transferred to stepdown/ICU. Goals of care discussions with palliative medicine team with expressed understanding of not escalating care.   Assessment and Plan:  Severe sepsis with septic shock  -completed IV pressors, continue supportive measures  -lactic acidosis resolved with IV fluid hydration -discussed with palliative team after family meeting, no escalation of care, daughters to discuss further with sons regarding residential hospice placement.   E coli UTI - multidrug resistant organism -- continue IV meropenem  as the E coli is sensitive to this -- planning 7 days of meropenem  for E. coli bacteremia in hospitalized patients who have achieved clinical stability and source control, with consideration for oral step-down therapy if susceptibility and clinical response allow  Acute metabolic encephalopathy -- improving  -- remains encephalopathic but slowly improving -- monitor for signs of meaningful recovery   Hypokalemia -- IV replacement ordered and repleted   Hypomagnesemia -- IV replacement ordered   Hypernatremia --resolved with increased free water delivery  Leukocytosis -- WBC slowly trending down -- treating as above  -- CBC with diff in AM   AKI on CKD stage 3a -- slowly improving with IV fluid hydration -- recheck in AM    Type 2 DM  -- continue SSI coverage and frequent CBG monitoring CBG (last 3)  Recent Labs    09/14/24 1943 09/15/24 0734 09/15/24 1151  GLUCAP 138* 99 159*    Thrombocytopenia -- DC subcutaneous heparin  and place SCDs   DVT prophylaxis: SCDs Code Status: DNR  DNI Family Communication: daughter Disposition:anticipating return to [facility] with hospice support   Consultants:  Palliative care   Subjective: Eating and drinking a little better, seems to be mentating better, closer to baseline     Objective: Vitals:   09/14/24 1940 09/15/24 0031 09/15/24 0327 09/15/24 1133  BP: 112/73 124/78 105/60 94/80  Pulse: 72 78 91 87  Resp: 20  20   Temp: 97.7 F (36.5 C) 98.1 F (36.7 C) 98.6 F (37 C)   TempSrc: Oral Oral Axillary   SpO2: 92%  93%   Weight:      Height:        Intake/Output Summary (Last 24 hours) at 09/15/2024 1325 Last data filed at 09/15/2024 0947 Gross per 24 hour  Intake 240 ml  Output 300 ml  Net -60 ml   Filed Weights   09/09/24 1706  Weight: 70.3 kg   Examination:  General exam: Appears to be improved; large bruise around right eye socket.   Respiratory system: Clear to auscultation. Respiratory effort normal. Cardiovascular system: normal S1 & S2 heard. No JVD, murmurs, rubs, gallops or clicks. No pedal edema. Gastrointestinal system: Abdomen is nondistended, soft and nontender. No organomegaly or masses felt. Normal bowel sounds heard. Central nervous system: alert but disoriented. No focal neurological deficits. Extremities: right knee painful to touch, crepitus bone on bone feeling, painful extension of right leg Skin: No rashes, lesions or ulcers.  Psychiatry: Judgement and insight appear diminished.  Data Reviewed: I have personally reviewed following labs and imaging studies  CBC: Recent Labs  Lab 09/09/24 1932 09/10/24 0525 09/11/24 0613 09/12/24 0538 09/13/24 0544 09/14/24 0512  WBC 9.2 9.4 25.0* 18.3* 11.1* 8.3   NEUTROABS 3.9  --   --  15.1* 7.9* 4.7  HGB 11.8* 12.3 9.9* 9.7* 9.5* 9.5*  HCT 38.5 40.2 31.7* 31.5* 30.7* 31.5*  MCV 95.3 96.6 96.1 96.6 95.6 95.2  PLT 108* 118* 98* 93* 79* 91*    Basic Metabolic Panel: Recent Labs  Lab 09/10/24 0525 09/11/24 0613 09/12/24 0538 09/13/24 0544 09/14/24 0512  NA 147* 139 140 139 143  K 3.2* 4.0 3.7 3.4* 4.3  CL 109 108 111 108 116*  CO2 24 23 22 22 23   GLUCOSE 82 151* 161* 154* 85  BUN 33* 39* 32* 23 21  CREATININE 1.55* 1.96* 1.47* 1.28* 1.06*  CALCIUM  8.4* 8.0* 8.2* 8.3* 8.5*  MG 1.8  --   --  1.6* 2.4  PHOS 2.9  --   --   --   --     CBG: Recent Labs  Lab 09/14/24 1133 09/14/24 1645 09/14/24 1943 09/15/24 0734 09/15/24 1151  GLUCAP 161* 152* 138* 99 159*    Recent Results (from the past 240 hours)  Urine Culture     Status: Abnormal   Collection Time: 09/09/24  8:00 PM   Specimen: Urine, Clean Catch  Result Value Ref Range Status   Specimen Description   Final    URINE, CLEAN CATCH Performed at [facility], [address] [place], [place] [address]    Special Requests   Final    NONE Performed at [facility], [address] [place], [place] [address]    Culture (A)  Final    >=100,000 COLONIES/mL ESCHERICHIA COLI Confirmed Extended Spectrum Beta-Lactamase Producer (ESBL).  In bloodstream infections from ESBL organisms, carbapenems are preferred over piperacillin/tazobactam. They are shown to have a lower risk of mortality.    Report Status 09/12/2024 FINAL  Final   Organism ID, Bacteria ESCHERICHIA COLI (A)  Final      Susceptibility   Escherichia coli - MIC*    AMPICILLIN >=32 RESISTANT Resistant     CEFAZOLIN (URINE) Value in next row Resistant      >=32 RESISTANTThis is a modified FDA-approved test that has been validated and its performance characteristics determined by the reporting laboratory.  This laboratory is certified under the Clinical Laboratory Improvement Amendments CLIA as qualified to perform high  complexity clinical laboratory testing.    CEFEPIME  Value in next row Resistant      >=32 RESISTANTThis is a modified FDA-approved test that has been validated and its performance characteristics determined by the reporting laboratory.  This laboratory is certified under the Clinical Laboratory Improvement Amendments CLIA as qualified to perform high complexity clinical laboratory testing.    ERTAPENEM Value in next row Sensitive      >=32 RESISTANTThis is a modified FDA-approved test that has been validated and its performance characteristics determined by the reporting laboratory.  This laboratory is certified under the Clinical Laboratory Improvement Amendments CLIA as qualified to perform high complexity clinical laboratory testing.    CEFTRIAXONE  Value in next row Resistant      >=32 RESISTANTThis is a modified FDA-approved test that has been validated and its performance characteristics determined by the reporting laboratory.  This laboratory is certified under the Clinical Laboratory Improvement Amendments CLIA as qualified to perform  high complexity clinical laboratory testing.    CIPROFLOXACIN Value in next row Resistant      >=32 RESISTANTThis is a modified FDA-approved test that has been validated and its performance characteristics determined by the reporting laboratory.  This laboratory is certified under the Clinical Laboratory Improvement Amendments CLIA as qualified to perform high complexity clinical laboratory testing.    GENTAMICIN Value in next row Sensitive      >=32 RESISTANTThis is a modified FDA-approved test that has been validated and its performance characteristics determined by the reporting laboratory.  This laboratory is certified under the Clinical Laboratory Improvement Amendments CLIA as qualified to perform high complexity clinical laboratory testing.    NITROFURANTOIN Value in next row Sensitive      >=32 RESISTANTThis is a modified FDA-approved test that has been  validated and its performance characteristics determined by the reporting laboratory.  This laboratory is certified under the Clinical Laboratory Improvement Amendments CLIA as qualified to perform high complexity clinical laboratory testing.    TRIMETH/SULFA Value in next row Resistant      >=32 RESISTANTThis is a modified FDA-approved test that has been validated and its performance characteristics determined by the reporting laboratory.  This laboratory is certified under the Clinical Laboratory Improvement Amendments CLIA as qualified to perform high complexity clinical laboratory testing.    AMPICILLIN/SULBACTAM Value in next row Resistant      >=32 RESISTANTThis is a modified FDA-approved test that has been validated and its performance characteristics determined by the reporting laboratory.  This laboratory is certified under the Clinical Laboratory Improvement Amendments CLIA as qualified to perform high complexity clinical laboratory testing.    PIP/TAZO Value in next row Sensitive ug/mL     8 SENSITIVEThis is a modified FDA-approved test that has been validated and its performance characteristics determined by the reporting laboratory.  This laboratory is certified under the Clinical Laboratory Improvement Amendments CLIA as qualified to perform high complexity clinical laboratory testing.    MEROPENEM  Value in next row Sensitive      8 SENSITIVEThis is a modified FDA-approved test that has been validated and its performance characteristics determined by the reporting laboratory.  This laboratory is certified under the Clinical Laboratory Improvement Amendments CLIA as qualified to perform high complexity clinical laboratory testing.    * >=100,000 COLONIES/mL ESCHERICHIA COLI  MRSA Next Gen by PCR, Nasal     Status: None   Collection Time: 09/10/24  3:10 PM   Specimen: Nasal Mucosa; Nasal Swab  Result Value Ref Range Status   MRSA by PCR Next Gen NOT DETECTED NOT DETECTED Final    Comment:  (NOTE) The GeneXpert MRSA Assay (FDA approved for NASAL specimens only), is one component of a comprehensive MRSA colonization surveillance program. It is not intended to diagnose MRSA infection nor to guide or monitor treatment for MRSA infections. Test performance is not FDA approved in patients less than 13 years old. Performed at [facility], [address], [place], [place] [address]   Culture, blood (Routine X 2) w Reflex to ID Panel     Status: None (Preliminary result)   Collection Time: 09/10/24  3:34 PM   Specimen: BLOOD  Result Value Ref Range Status   Specimen Description BLOOD LEFT ANTECUBITAL  Final   Special Requests   Final    BOTTLES DRAWN AEROBIC ONLY Blood Culture adequate volume   Culture   Final    NO GROWTH 4 DAYS Performed at [facility], [address] [place], [place]  [address]    Report Status PENDING  Incomplete  Culture, blood (Routine X 2) w Reflex to ID Panel     Status: None (Preliminary result)   Collection Time: 09/10/24  3:34 PM   Specimen: BLOOD  Result Value Ref Range Status   Specimen Description BLOOD BLOOD LEFT HAND  Final   Special Requests   Final    BOTTLES DRAWN AEROBIC ONLY Blood Culture results may not be optimal due to an inadequate volume of blood received in culture bottles   Culture   Final    NO GROWTH 4 DAYS Performed at [facility], [address] [place], [place] [address]    Report Status PENDING  Incomplete     Radiology Studies: No results found.   Scheduled Meds:  allopurinol   300 mg Oral Daily   Chlorhexidine  Gluconate Cloth  6 each Topical Daily   clopidogrel   75 mg Oral q AM   divalproex   375 mg Oral TID   heparin   5,000 Units Subcutaneous Q8H   insulin  aspart  0-9 Units Subcutaneous TID WC   melatonin  6 mg Oral QHS   metoprolol  tartrate  12.5 mg Oral BID   mirtazapine   15 mg Oral QHS   rosuvastatin   10 mg Oral QPM   Continuous Infusions:  meropenem  (MERREM ) IV Stopped (09/15/24 0200)    LOS: 5 days    Time spent: 55 mins  Andilyn Bettcher Vicci, MD How to contact the [facility] Attending or Consulting provider 7A - 7P or covering provider during after hours 7P -7A, for this patient?  Check the care team in [facility] and look for a) attending/consulting TRH provider listed and b) the TRH team listed Log into [email or web address] to find provider on call.  Locate the TRH provider you are looking for under [facility] and page to a number that you can be directly reached. If you still have difficulty reaching the provider, please page the [facility] (Director on Call) for the Hospitalists listed on amion for assistance.  09/15/2024, 1:25 PM

## 2024-09-15 NOTE — Plan of Care (Signed)

## 2024-09-15 NOTE — TOC Progression Note (Signed)
 Transition of Care [facility]) - Progression Note    Patient Details  Name: Katelyn Ballard MRN: [id number] Date of Birth: 1945/08/20  Transition of Care [facility]) CM/SW Contact  Sharlyne Stabs, RN Phone Number: 09/15/2024, 1:05 PM  Clinical Narrative:   Palliative discussion  continue. TOC confirmed with [facility] they have Hospice services with [facility]. They come into the facility 3-4 days a week. Family has mentioned [facility]. [facility] with allow a one time contract. NP will give family options, TOC following.     Expected Discharge Plan: Skilled Nursing Facility Barriers to Discharge: Continued Medical Work up    Expected Discharge Plan and Services     Post Acute Care Choice: Durable Medical Equipment, Skilled Nursing Facility Living arrangements for the past 2 months: Skilled Nursing Facility                      Social Drivers of Health (SDOH) Interventions SDOH Screenings   Food Insecurity: Patient Unable To Answer (09/10/2024)  Housing: Patient Unable To Answer (09/10/2024)  Transportation Needs: Patient Unable To Answer (09/10/2024)  Utilities: Patient Unable To Answer (09/10/2024)  Depression (PHQ2-9): Low Risk  (12/26/2023)  Recent Concern: Depression (PHQ2-9) - Medium Risk (12/11/2023)  Financial Resource Strain: Low Risk  (08/06/2024)   Received from [facility]  Physical Activity: Insufficiently Active (02/05/2022)  Social Connections: Patient Unable To Answer (09/10/2024)  Stress: No Stress Concern Present (02/05/2022)   Received from [facility]  Recent Concern: Stress - Stress Concern Present (02/05/2022)  Tobacco Use: Medium Risk (09/09/2024)    Readmission Risk Interventions     No data to display

## 2024-09-15 NOTE — Progress Notes (Signed)
                                                                                                                                                          Daily Progress Note   Patient Name: Katelyn Ballard       Date: 09/15/2024 DOB: 1945-06-14  Age: 79 y.o. MRN#: [id number] Attending Physician: Vicci Afton CROME, MD Primary Care Physician: Bluford Carlin LABOR, MD Admit Date: 09/09/2024  Reason for Consultation/Follow-up: {Reason for Consult:23484}  Subjective: ***  Today, ***  Chart review/care coordination:  Completed extensive chart review including EPIC notes, ***. Coordinated care with ****.    Length of Stay: 5   Physical Exam          Vital Signs: BP 105/60 (BP Location: Right Arm)   Pulse 91   Temp 98.6 F (37 C) (Axillary)   Resp 20   Ht 5' 8 (1.727 m)   Wt 70.3 kg   SpO2 93%   BMI 23.57 kg/m  SpO2: SpO2: 93 % O2 Device: O2 Device: Room Air O2 Flow Rate: O2 Flow Rate (L/min): 2 L/min      Palliative Assessment/Data:   Palliative Care Assessment & Plan   Patient Profile/Assessment:  ***   Recommendations/Plan: ***   Symptom management:  ***  Prognosis:  {Palliative Care Prognosis:23504}    Discharge Planning: {Palliative dispostion:23505}    Detailed review of medical records (labs, imaging, vital signs), medically appropriate exam, discussed with treatment team, counseling and education to patient, family, & staff, documenting clinical information, medication management, coordination of care   Total time: I spent *** minutes in the care of the patient today in the above activities and documenting the encounter.   Billing based on MDM: ***  {Problems Addressed:304933}  {Amount and/or Complexity of Ijuj:695065}  {Risks:304936}         Katelyn CHRISTELLA Pinal, NP  Palliative Medicine Team Team phone # [phone]  Thank you for allowing the Palliative Medicine Team to assist in the care of this patient. Please utilize secure chat  with additional questions, if there is no response within 30 minutes please call the above phone number.  Palliative Medicine Team providers are available by phone from 7am to 7pm daily and can be reached through the team cell phone.  Should this patient require assistance outside of these hours, please call the patient's attending physician.

## 2024-09-16 DIAGNOSIS — N3001 Acute cystitis with hematuria: Secondary | ICD-10-CM | POA: Diagnosis not present

## 2024-09-16 DIAGNOSIS — Z7189 Other specified counseling: Secondary | ICD-10-CM | POA: Diagnosis not present

## 2024-09-16 DIAGNOSIS — Z515 Encounter for palliative care: Secondary | ICD-10-CM | POA: Diagnosis not present

## 2024-09-16 DIAGNOSIS — A419 Sepsis, unspecified organism: Secondary | ICD-10-CM | POA: Diagnosis not present

## 2024-09-16 DIAGNOSIS — Z558 Other problems related to education and literacy: Secondary | ICD-10-CM | POA: Diagnosis not present

## 2024-09-16 DIAGNOSIS — N1831 Chronic kidney disease, stage 3a: Secondary | ICD-10-CM

## 2024-09-16 DIAGNOSIS — N179 Acute kidney failure, unspecified: Secondary | ICD-10-CM | POA: Diagnosis not present

## 2024-09-16 LAB — CBC
HCT: 32.6 % — ABNORMAL LOW (ref 36.0–46.0)
Hemoglobin: 10.1 g/dL — ABNORMAL LOW (ref 12.0–15.0)
MCH: 29.4 pg (ref 26.0–34.0)
MCHC: 31 g/dL (ref 30.0–36.0)
MCV: 94.8 fL (ref 80.0–100.0)
Platelets: 119 K/uL — ABNORMAL LOW (ref 150–400)
RBC: 3.44 MIL/uL — ABNORMAL LOW (ref 3.87–5.11)
RDW: 16.8 % — ABNORMAL HIGH (ref 11.5–15.5)
WBC: 9.5 K/uL (ref 4.0–10.5)
nRBC: 0.2 % (ref 0.0–0.2)

## 2024-09-16 LAB — GLUCOSE, CAPILLARY
Glucose-Capillary: 101 mg/dL — ABNORMAL HIGH (ref 70–99)
Glucose-Capillary: 145 mg/dL — ABNORMAL HIGH (ref 70–99)

## 2024-09-16 LAB — BASIC METABOLIC PANEL WITH GFR
Anion gap: 7 (ref 5–15)
BUN: 21 mg/dL (ref 8–23)
CO2: 26 mmol/L (ref 22–32)
Calcium: 8.5 mg/dL — ABNORMAL LOW (ref 8.9–10.3)
Chloride: 110 mmol/L (ref 98–111)
Creatinine, Ser: 1.04 mg/dL — ABNORMAL HIGH (ref 0.44–1.00)
GFR, Estimated: 54 mL/min — ABNORMAL LOW (ref >60)
Glucose, Bld: 94 mg/dL (ref 70–99)
Potassium: 3.8 mmol/L (ref 3.5–5.1)
Sodium: 143 mmol/L (ref 135–145)

## 2024-09-16 MED ORDER — LACTATED RINGERS IV BOLUS
500.0000 mL | Freq: Once | INTRAVENOUS | Status: AC
  Administered 2024-09-16: 500 mL via INTRAVENOUS

## 2024-09-16 MED ORDER — LACTATED RINGERS IV SOLN
INTRAVENOUS | Status: DC
  Administered 2024-09-16: 500 mL via INTRAVENOUS

## 2024-09-16 MED ORDER — ENSURE PLUS HIGH PROTEIN PO LIQD
237.0000 mL | Freq: Two times a day (BID) | ORAL | Status: DC
  Administered 2024-09-18 (×2): 237 mL via ORAL

## 2024-09-16 NOTE — Progress Notes (Signed)
 Initial Nutrition Assessment  DOCUMENTATION CODES:      INTERVENTION:   -MVI with minerals daily -Ensure Plus High Protein po BID, each supplement provides 350 kcal and 20 grams of protein  -Feeding assistance with meals -Obtain new wt  NUTRITION DIAGNOSIS:   Increased nutrient needs related to acute illness as evidenced by estimated needs.  GOAL:   Patient will meet greater than or equal to 90% of their needs  MONITOR:   PO intake  REASON FOR ASSESSMENT:   Malnutrition Screening Tool    ASSESSMENT:   Pt with hx Dementia, stroke, CAD with hx PCI, Diastolic HF, MR, HTN, HLD, DM type 2, CKD stage III, prior discitis OM at C7-T1 completed Abx, who presented with GLF, reportedly worse mental status compared to baseline. Found to have borderline AKI and likely UTI  Pt admitted with GLF and acute on chronic encephalopathy.   Reviewed I/O's: -560 ml x 24 hours and +4.5 L since admission  UOP: 800 ml x 24 hours  Pt unavailable at time of visit. Attempted to speak with pt via call to hospital room phone, however, unable to reach. RD unable to obtain further nutrition-related history or complete nutrition-focused physical exam at this time.    Case discussed in interdisciplinary care rounds.  Imaging revealed no acute abnormalities.  Per nursing, pt is very dependent in care and requires feeding assistance with meals.   Pt currently on a dysphagia 2 diet. Noted meal completions 20-100% of meals.  Reviewed wt hx; unsure if current wt is a stated wt or measured wt. RD will obtain new wt to better assess wt changes.   Medications reviewed and include plavix , melatonin, and remeron .  Per MD, likely discharge tomorrow (back to SNF[facility] with hospice). Palliative care following; family wants to d/c back to facility with hospice.   Lab Results  Component Value Date   HGBA1C 5.3 09/11/2024   PTA DM medications are 0.75 mg trulicity weekly.   Labs reviewed: CBGS:  99-159 (inpatient orders for glycemic control are none).    Diet Order:   Diet Order             DIET DYS 2 Room service appropriate? Yes; Fluid consistency: Thin  Diet effective now                   EDUCATION NEEDS:      Skin:  Skin Assessment: Reviewed RN Assessment  Last BM:  09/13/24  Height:   Ht Readings from Last 1 Encounters:  09/14/24 5' 8 (1.727 m)    Weight:   Wt Readings from Last 1 Encounters:  09/09/24 70.3 kg    Ideal Body Weight:  63.6 kg  BMI:  Body mass index is 23.57 kg/m.  Estimated Nutritional Needs:   Kcal:  1600-1800  Protein:  80-95 grams  Fluid:  1.6-1.8 L    Margery ORN, RD, LDN, CDCES Registered Dietitian III Certified Diabetes Care and Education Specialist If unable to reach this RD, please use RD Inpatient group chat on secure chat between hours of 8am-4 pm daily

## 2024-09-16 NOTE — Progress Notes (Signed)
                                                                                                                                                          Daily Progress Note   Patient Name: Katelyn Ballard       Date: 09/16/2024 DOB: 01-06-1945  Age: 79 y.o. MRN#: [id number] Attending Physician: Evonnie Lenis, MD Primary Care Physician: Bluford Carlin LABOR, MD Admit Date: 09/09/2024  Reason for Consultation/Follow-up: {Reason for Consult:23484}  Subjective: ***  Today, ***  Chart review/care coordination:  Completed extensive chart review including EPIC notes, ***. Coordinated care with ****.    Length of Stay: 6   Physical Exam          Vital Signs: BP (!) 114/55 (BP Location: Left Arm)   Pulse 62   Temp 98.1 F (36.7 C) (Oral)   Resp 20   Ht 5' 8 (1.727 m)   Wt 70.3 kg   SpO2 95%   BMI 23.57 kg/m  SpO2: SpO2: 95 % O2 Device: O2 Device: Room Air O2 Flow Rate: O2 Flow Rate (L/min): 2 L/min      Palliative Assessment/Data:   Palliative Care Assessment & Plan   Patient Profile/Assessment:  ***   Recommendations/Plan: ***   Symptom management:  ***  Prognosis:  {Palliative Care Prognosis:23504}    Discharge Planning: {Palliative dispostion:23505}    Detailed review of medical records (labs, imaging, vital signs), medically appropriate exam, discussed with treatment team, counseling and education to patient, family, & staff, documenting clinical information, medication management, coordination of care   Total time: I spent *** minutes in the care of the patient today in the above activities and documenting the encounter.   Billing based on MDM: ***  {Problems Addressed:304933}  {Amount and/or Complexity of Ijuj:695065}  {Risks:304936}         [name], NP  Palliative Medicine Team Team phone # [phone]  Thank you for allowing the Palliative Medicine Team to assist in the care of this patient. Please utilize secure chat with  additional questions, if there is no response within 30 minutes please call the above phone number.  Palliative Medicine Team providers are available by phone from 7am to 7pm daily and can be reached through the team cell phone.  Should this patient require assistance outside of these hours, please call the patient's attending physician.

## 2024-09-16 NOTE — TOC Progression Note (Signed)
 Transition of Care [facility]) - Progression Note    Patient Details  Name: Katelyn Ballard MRN: [id number] Date of Birth: 11/08/1945  [facility] CM/SW Contact  Sharlyne Stabs, RN Phone Number: 09/16/2024, 12:57 PM  Clinical Narrative:   Planning for patient to discharge back to [facility]. Palliative discussion continue here, family will work with [employer] [facility] to select and set up hospice at the facility once the patient is back there. Michael at [facility] updated.   AddendumBETHA Charlott Ballard, Plan to discharge back to facility tomorrow.   Expected Discharge Plan: Long Term Acute Care (LTAC) Barriers to Discharge: Continued Medical Work up   Expected Discharge Plan and Services     Post Acute Care Choice: Durable Medical Equipment, Skilled Nursing Facility Living arrangements for the past 2 months: Skilled Nursing Facility            Social Drivers of Health (SDOH) Interventions SDOH Screenings   Food Insecurity: Patient Unable To Answer (09/10/2024)  Housing: Patient Unable To Answer (09/10/2024)  Transportation Needs: Patient Unable To Answer (09/10/2024)  Utilities: Patient Unable To Answer (09/10/2024)  Depression (PHQ2-9): Low Risk  (12/26/2023)  Recent Concern: Depression (PHQ2-9) - Medium Risk (12/11/2023)  Financial Resource Strain: Low Risk  (08/06/2024)   Received from [facility]  Physical Activity: Insufficiently Active (02/05/2022)  Social Connections: Patient Unable To Answer (09/10/2024)  Stress: No Stress Concern Present (02/05/2022)   Received from [facility]  Recent Concern: Stress - Stress Concern Present (02/05/2022)  Tobacco Use: Medium Risk (09/09/2024)    Readmission Risk Interventions     No data to display

## 2024-09-16 NOTE — Plan of Care (Signed)
   Problem: Education: Goal: Knowledge of General Education information will improve Description Including pain rating scale, medication(s)/side effects and non-pharmacologic comfort measures Outcome: Progressing

## 2024-09-16 NOTE — Progress Notes (Signed)
 PROGRESS NOTE  Katelyn Ballard FMW:[id number] DOB: [birth year] DOA: 09/09/2024 PCP: Bluford Carlin LABOR, MD  Brief History:  79 y.o. female with a history of dementia, stroke CAD s/p PCI, diastolic heart failure, mitral valve regurgitation, hypertension, hyperlipidemia, diabetes mellitus type 2, CKD stage III.  Patient presented secondary to fall with altered mental status and concern for metabolic encephalopathy secondary to UTI. Empiric antibiotics started. During hospitalization, patient developed worsening hypotension, consistent with severe sepsis, and was transferred to stepdown/ICU. Goals of care discussions with palliative medicine team with expressed understanding of not escalating care.   Assessment/Plan:  Severe sepsis with septic shock  -initially on IV pressors -lactic acidosis resolved with IV fluid hydration -discussed with palliative team after family meeting, no escalation of care, daughters to discuss further with sons regarding residential hospice placement.  -sepsis physiology has resolved   E coli UTI - multidrug resistant organism -- continue IV meropenem    Acute metabolic encephalopathy  -- improving  -- remains encephalopathic but slowly improving   Hypokalemia --repleted   Hypomagnesemia -- IV replacement ordered    Hypernatremia --resolved with increased free water    AKI on CKD stage 3a -- baseline creatinine 0.9-1.2 --serum creatinine peaked 1.96 --due to sepsis/hypotension and volume depletion   Type 2 DM  --controlled --09/11/24 A1C = 5.3 --stop SSI  Thrombocytopenia -- DC subcutaneous heparin  and place SCDs -due sepsis -improving -no sign of bleeding       Family Communication:   no Family at bedside  Consultants:  palliative  Code Status:  DNR  DVT Prophylaxis:  SCDs   Procedures: As Listed in Progress Note Above  Antibiotics: Merrem  9/12>>      Subjective: Patient denies fevers, chills, headache, chest  pain, dyspnea, nausea, vomiting, diarrhea, abdominal pain,    Objective: Vitals:   09/15/24 1133 09/15/24 1412 09/15/24 2242 09/16/24 1420  BP: 94/80 111/68 (!) 114/55 99/60  Pulse: 87 87 62 (!) 106  Resp:      Temp:  97.8 F (36.6 C) 98.1 F (36.7 C) 97.7 F (36.5 C)  TempSrc:  Oral Oral Oral  SpO2:  (!) 87% 95% 94%  Weight:      Height:        Intake/Output Summary (Last 24 hours) at 09/16/2024 1458 Last data filed at 09/16/2024 0400 Gross per 24 hour  Intake --  Output 800 ml  Net -800 ml   Weight change:  Exam:  General:  Pt is alert, follows commands appropriately, not in acute distress HEENT: No icterus, No thrush, No neck mass, Schleicher/AT Cardiovascular: RRR, S1/S2, no rubs, no gallops Respiratory: bibasilar rales.  No wheeze Abdomen: Soft/+BS, non tender, non distended, no guarding Extremities: No edema, No lymphangitis, No petechiae, No rashes, no synovitis   Data Reviewed: I have personally reviewed following labs and imaging studies Basic Metabolic Panel: Recent Labs  Lab 09/10/24 0525 09/11/24 0613 09/12/24 0538 09/13/24 0544 09/14/24 0512 09/16/24 0442  NA 147* 139 140 139 143 143  K 3.2* 4.0 3.7 3.4* 4.3 3.8  CL 109 108 111 108 116* 110  CO2 24 23 22 22 23 26   GLUCOSE 82 151* 161* 154* 85 94  BUN 33* 39* 32* 23 21 21   CREATININE 1.55* 1.96* 1.47* 1.28* 1.06* 1.04*  CALCIUM  8.4* 8.0* 8.2* 8.3* 8.5* 8.5*  MG 1.8  --   --  1.6* 2.4  --   PHOS 2.9  --   --   --   --   --  Liver Function Tests: Recent Labs  Lab 09/09/24 1932  AST 12*  ALT 12  ALKPHOS 95  BILITOT 0.6  PROT 6.6  ALBUMIN 2.6*   No results for input(s): LIPASE, AMYLASE in the last 168 hours. No results for input(s): AMMONIA in the last 168 hours. Coagulation Profile: No results for input(s): INR, PROTIME in the last 168 hours. CBC: Recent Labs  Lab 09/09/24 1932 09/10/24 0525 09/11/24 9386 09/12/24 0538 09/13/24 0544 09/14/24 0512 09/16/24 0442  WBC 9.2   <  > 25.0* 18.3* 11.1* 8.3 9.5  NEUTROABS 3.9  --   --  15.1* 7.9* 4.7  --   HGB 11.8*   < > 9.9* 9.7* 9.5* 9.5* 10.1*  HCT 38.5   < > 31.7* 31.5* 30.7* 31.5* 32.6*  MCV 95.3   < > 96.1 96.6 95.6 95.2 94.8  PLT 108*   < > 98* 93* 79* 91* 119*   < > = values in this interval not displayed.   Cardiac Enzymes: No results for input(s): CKTOTAL, CKMB, CKMBINDEX, TROPONINI in the last 168 hours. BNP: Invalid input(s): POCBNP CBG: Recent Labs  Lab 09/15/24 0734 09/15/24 1151 09/15/24 1641 09/16/24 0803 09/16/24 1140  GLUCAP 99 159* 119* 101* 145*   HbA1C: No results for input(s): HGBA1C in the last 72 hours. Urine analysis:    Component Value Date/Time   COLORURINE YELLOW 09/09/2024 2000   APPEARANCEUR TURBID (A) 09/09/2024 2000   LABSPEC 1.014 09/09/2024 2000   PHURINE 5.0 09/09/2024 2000   GLUCOSEU NEGATIVE 09/09/2024 2000   HGBUR NEGATIVE 09/09/2024 2000   BILIRUBINUR NEGATIVE 09/09/2024 2000   KETONESUR NEGATIVE 09/09/2024 2000   PROTEINUR 30 (A) 09/09/2024 2000   NITRITE POSITIVE (A) 09/09/2024 2000   LEUKOCYTESUR MODERATE (A) 09/09/2024 2000   Sepsis Labs: @LABRCNTIP (procalcitonin:4,lacticidven:4) ) Recent Results (from the past 240 hours)  Urine Culture     Status: Abnormal   Collection Time: 09/09/24  8:00 PM   Specimen: Urine, Clean Catch  Result Value Ref Range Status   Specimen Description   Final    URINE, CLEAN CATCH Performed at [facility] [address] [place], [place] [address]    Special Requests   Final    NONE Performed at [facility], [address] [place], [place] [address]    Culture (A)  Final    >=100,000 COLONIES/mL ESCHERICHIA COLI Confirmed Extended Spectrum Beta-Lactamase Producer (ESBL).  In bloodstream infections from ESBL organisms, carbapenems are preferred over piperacillin/tazobactam. They are shown to have a lower risk of mortality.    Report Status 09/12/2024 FINAL  Final   Organism ID, Bacteria ESCHERICHIA COLI  (A)  Final      Susceptibility   Escherichia coli - MIC*    AMPICILLIN >=32 RESISTANT Resistant     CEFAZOLIN (URINE) Value in next row Resistant      >=32 RESISTANTThis is a modified FDA-approved test that has been validated and its performance characteristics determined by the reporting laboratory.  This laboratory is certified under the Clinical Laboratory Improvement Amendments CLIA as qualified to perform high complexity clinical laboratory testing.    CEFEPIME  Value in next row Resistant      >=32 RESISTANTThis is a modified FDA-approved test that has been validated and its performance characteristics determined by the reporting laboratory.  This laboratory is certified under the Clinical Laboratory Improvement Amendments CLIA as qualified to perform high complexity clinical laboratory testing.    ERTAPENEM Value in next row Sensitive      >=32  RESISTANTThis is a modified FDA-approved test that has been validated and its performance characteristics determined by the reporting laboratory.  This laboratory is certified under the Clinical Laboratory Improvement Amendments CLIA as qualified to perform high complexity clinical laboratory testing.    CEFTRIAXONE  Value in next row Resistant      >=32 RESISTANTThis is a modified FDA-approved test that has been validated and its performance characteristics determined by the reporting laboratory.  This laboratory is certified under the Clinical Laboratory Improvement Amendments CLIA as qualified to perform high complexity clinical laboratory testing.    CIPROFLOXACIN Value in next row Resistant      >=32 RESISTANTThis is a modified FDA-approved test that has been validated and its performance characteristics determined by the reporting laboratory.  This laboratory is certified under the Clinical Laboratory Improvement Amendments CLIA as qualified to perform high complexity clinical laboratory testing.    GENTAMICIN Value in next row Sensitive      >=32  RESISTANTThis is a modified FDA-approved test that has been validated and its performance characteristics determined by the reporting laboratory.  This laboratory is certified under the Clinical Laboratory Improvement Amendments CLIA as qualified to perform high complexity clinical laboratory testing.    NITROFURANTOIN Value in next row Sensitive      >=32 RESISTANTThis is a modified FDA-approved test that has been validated and its performance characteristics determined by the reporting laboratory.  This laboratory is certified under the Clinical Laboratory Improvement Amendments CLIA as qualified to perform high complexity clinical laboratory testing.    TRIMETH/SULFA Value in next row Resistant      >=32 RESISTANTThis is a modified FDA-approved test that has been validated and its performance characteristics determined by the reporting laboratory.  This laboratory is certified under the Clinical Laboratory Improvement Amendments CLIA as qualified to perform high complexity clinical laboratory testing.    AMPICILLIN/SULBACTAM Value in next row Resistant      >=32 RESISTANTThis is a modified FDA-approved test that has been validated and its performance characteristics determined by the reporting laboratory.  This laboratory is certified under the Clinical Laboratory Improvement Amendments CLIA as qualified to perform high complexity clinical laboratory testing.    PIP/TAZO Value in next row Sensitive ug/mL     8 SENSITIVEThis is a modified FDA-approved test that has been validated and its performance characteristics determined by the reporting laboratory.  This laboratory is certified under the Clinical Laboratory Improvement Amendments CLIA as qualified to perform high complexity clinical laboratory testing.    MEROPENEM  Value in next row Sensitive      8 SENSITIVEThis is a modified FDA-approved test that has been validated and its performance characteristics determined by the reporting laboratory.  This  laboratory is certified under the Clinical Laboratory Improvement Amendments CLIA as qualified to perform high complexity clinical laboratory testing.    * >=100,000 COLONIES/mL ESCHERICHIA COLI  MRSA Next Gen by PCR, Nasal     Status: None   Collection Time: 09/10/24  3:10 PM   Specimen: Nasal Mucosa; Nasal Swab  Result Value Ref Range Status   MRSA by PCR Next Gen NOT DETECTED NOT DETECTED Final    Comment: (NOTE) The GeneXpert MRSA Assay (FDA approved for NASAL specimens only), is one component of a comprehensive MRSA colonization surveillance program. It is not intended to diagnose MRSA infection nor to guide or monitor treatment for MRSA infections. Test performance is not FDA approved in patients less than 30 years old. Performed at [facility], [address] [place],  [place] [address]   Culture, blood (Routine X 2) w Reflex to ID Panel     Status: None   Collection Time: 09/10/24  3:34 PM   Specimen: BLOOD  Result Value Ref Range Status   Specimen Description   Final    BLOOD LEFT ANTECUBITAL Performed at [facility], [address], [place], [place] [address]    Special Requests   Final    BOTTLES DRAWN AEROBIC ONLY Blood Culture adequate volume Performed at [facility], [address], [place], [place] [address]    Culture   Final    NO GROWTH 5 DAYS Performed at [facility], [address], [place], [place] [address]    Report Status 09/15/2024 FINAL  Final  Culture, blood (Routine X 2) w Reflex to ID Panel     Status: None   Collection Time: 09/10/24  3:34 PM   Specimen: BLOOD  Result Value Ref Range Status   Specimen Description   Final    BLOOD BLOOD LEFT HAND Performed at [facility], [address], [place], [place] [address]    Special Requests   Final    BOTTLES DRAWN AEROBIC ONLY Blood Culture results may not be optimal due to an inadequate volume of blood received in culture bottles Performed at [facility], [address], [place], [place]  [address]    Culture   Final    NO GROWTH 5 DAYS Performed at [facility], [address] [place], [place] [address]    Report Status 09/15/2024 FINAL  Final     Scheduled Meds:  allopurinol   300 mg Oral Daily   Chlorhexidine  Gluconate Cloth  6 each Topical Daily   clopidogrel   75 mg Oral q AM   divalproex   375 mg Oral TID   insulin  aspart  0-9 Units Subcutaneous TID WC   melatonin  6 mg Oral QHS   metoprolol  tartrate  12.5 mg Oral BID   mirtazapine   15 mg Oral QHS   rosuvastatin   10 mg Oral QPM   Continuous Infusions:  meropenem  (MERREM ) IV 1 g (09/16/24 0313)    Procedures/Studies: DG CHEST PORT 1 VIEW Result Date: 09/11/2024 CLINICAL DATA:  Hypotension. EXAM: PORTABLE CHEST 1 VIEW COMPARISON:  September 23, 2023. FINDINGS: Stable cardiomediastinal silhouette. Minimal bibasilar subsegmental atelectasis or scarring is noted. Bony thorax is unremarkable. IMPRESSION: Minimal bibasilar subsegmental atelectasis or scarring. Electronically Signed   By: Lynwood Landy Raddle M.D.   On: 09/11/2024 11:24   CT ABDOMEN PELVIS WO CONTRAST Result Date: 09/10/2024 CLINICAL DATA:  Acute kidney injury assess for intra-abdominal infection EXAM: CT ABDOMEN AND PELVIS WITHOUT CONTRAST TECHNIQUE: Multidetector CT imaging of the abdomen and pelvis was performed following the standard protocol without IV contrast. RADIATION DOSE REDUCTION: This exam was performed according to the departmental dose-optimization program which includes automated exposure control, adjustment of the mA and/or kV according to patient size and/or use of iterative reconstruction technique. COMPARISON:  None Available. FINDINGS: Lower chest: Lung bases demonstrate no acute airspace disease. Atelectasis at the lung bases. Multi-vessel coronary vascular calcification. Hepatobiliary: Motion degradation. Gallstones. No biliary dilatation. Pancreas: Unremarkable. No pancreatic ductal dilatation or surrounding inflammatory changes. Spleen:  Normal in size without focal abnormality. Adrenals/Urinary Tract: Adrenal glands are normal. Kidneys show no hydronephrosis. Multiple cysts in the left kidney, no imaging follow-up is recommended. The bladder is unremarkable Stomach/Bowel: The stomach is within normal limits. No dilated small bowel. No acute bowel wall thickening. Negative appendix. Mild diverticular  disease of the left colon Vascular/Lymphatic: Aortic atherosclerosis. No enlarged abdominal or pelvic lymph nodes. Reproductive: Hysterectomy.  No adnexal mass Other: Negative for pelvic effusion or free air. Moderate fat containing periumbilical hernia Musculoskeletal: No acute or suspicious osseous abnormality IMPRESSION: 1. No CT evidence for acute intra-abdominal or pelvic abnormality allowing for motion degradation. 2. Gallstones. 3. Mild diverticular disease of the left colon without acute inflammatory process. 4. Aortic atherosclerosis. Aortic Atherosclerosis (ICD10-I70.0). Electronically Signed   By: Luke Bun M.D.   On: 09/10/2024 23:44   CT Head Wo Contrast Result Date: 09/09/2024 CLINICAL DATA:  Fall with neck pain hematoma to right eye and forehead EXAM: CT HEAD WITHOUT CONTRAST CT MAXILLOFACIAL WITHOUT CONTRAST CT CERVICAL SPINE WITHOUT CONTRAST TECHNIQUE: Multidetector CT imaging of the head, cervical spine, and maxillofacial structures were performed using the standard protocol without intravenous contrast. Multiplanar CT image reconstructions of the cervical spine and maxillofacial structures were also generated. RADIATION DOSE REDUCTION: This exam was performed according to the departmental dose-optimization program which includes automated exposure control, adjustment of the mA and/or kV according to patient size and/or use of iterative reconstruction technique. COMPARISON:  MRI 12/10/2023, CT brain and cervical spine 09/23/2023 FINDINGS: CT HEAD FINDINGS Brain: No acute territorial infarction, hemorrhage or intracranial mass.  Advanced atrophy. Moderate severe chronic small vessel ischemic changes of the white matter. The ventricles are enlarged but stable in size, presumably due to atrophy. Vascular: No hyperdense vessels. Vertebral and carotid vascular calcification Skull: Normal. Negative for fracture or focal lesion. Other: None CT MAXILLOFACIAL FINDINGS Osseous: Slightly limited by motion degradation. Mastoid air cells are clear. No definitive mandibular fracture. Pterygoid plates and zygomatic arches are intact. No acute nasal bone fracture Orbits: Negative. No traumatic or inflammatory finding. Sinuses: Clear. Soft tissues: Negative. CT CERVICAL SPINE FINDINGS Alignment: No subluxation.  Facet alignment is normal Skull base and vertebrae: No acute fracture. Vertebral body heights are grossly maintained Soft tissues and spinal canal: No prevertebral fluid or swelling. No visible canal hematoma. Disc levels: Advanced multilevel degenerative changes C3 through C7 with multilevel disc space narrowing, osteophyte and partial ankylosis. Irregular endplate erosive changes at C7-T1 with interval sclerosis, consistent with sequela of known osteomyelitis discitis. Multilevel facet degenerative change and ankylosis. Multilevel foraminal narrowing. Upper chest: Lung apices are clear.  Emphysema Other: None IMPRESSION: 1. No CT evidence for acute intracranial abnormality. Atrophy and chronic small vessel ischemic changes of the white matter. 2. No acute facial bone fracture allowing for motion degradation. 3. No acute osseous abnormality of the cervical spine. 4. 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 5. Emphysema Electronically Signed   By: Luke Bun M.D.   On: 09/09/2024 19:45   CT Maxillofacial Wo Contrast Result Date: 09/09/2024 CLINICAL DATA:  Fall with neck pain hematoma to right eye and forehead EXAM: CT HEAD WITHOUT CONTRAST CT MAXILLOFACIAL  WITHOUT CONTRAST CT CERVICAL SPINE WITHOUT CONTRAST TECHNIQUE: Multidetector CT imaging of the head, cervical spine, and maxillofacial structures were performed using the standard protocol without intravenous contrast. Multiplanar CT image reconstructions of the cervical spine and maxillofacial structures were also generated. RADIATION DOSE REDUCTION: This exam was performed according to the departmental dose-optimization program which includes automated exposure control, adjustment of the mA and/or kV according to patient size and/or use of iterative reconstruction technique. COMPARISON:  MRI 12/10/2023, CT brain and cervical spine 09/23/2023 FINDINGS: CT HEAD FINDINGS Brain: No acute territorial infarction, hemorrhage or intracranial mass. Advanced  atrophy. Moderate severe chronic small vessel ischemic changes of the white matter. The ventricles are enlarged but stable in size, presumably due to atrophy. Vascular: No hyperdense vessels. Vertebral and carotid vascular calcification Skull: Normal. Negative for fracture or focal lesion. Other: None CT MAXILLOFACIAL FINDINGS Osseous: Slightly limited by motion degradation. Mastoid air cells are clear. No definitive mandibular fracture. Pterygoid plates and zygomatic arches are intact. No acute nasal bone fracture Orbits: Negative. No traumatic or inflammatory finding. Sinuses: Clear. Soft tissues: Negative. CT CERVICAL SPINE FINDINGS Alignment: No subluxation.  Facet alignment is normal Skull base and vertebrae: No acute fracture. Vertebral body heights are grossly maintained Soft tissues and spinal canal: No prevertebral fluid or swelling. No visible canal hematoma. Disc levels: Advanced multilevel degenerative changes C3 through C7 with multilevel disc space narrowing, osteophyte and partial ankylosis. Irregular endplate erosive changes at C7-T1 with interval sclerosis, consistent with sequela of known osteomyelitis discitis. Multilevel facet degenerative change and  ankylosis. Multilevel foraminal narrowing. Upper chest: Lung apices are clear.  Emphysema Other: None IMPRESSION: 1. No CT evidence for acute intracranial abnormality. Atrophy and chronic small vessel ischemic changes of the white matter. 2. No acute facial bone fracture allowing for motion degradation. 3. No acute osseous abnormality of the cervical spine. 4. 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 5. Emphysema Electronically Signed   By: Luke Bun M.D.   On: 09/09/2024 19:45   CT Cervical Spine Wo Contrast Result Date: 09/09/2024 CLINICAL DATA:  Fall with neck pain hematoma to right eye and forehead EXAM: CT HEAD WITHOUT CONTRAST CT MAXILLOFACIAL WITHOUT CONTRAST CT CERVICAL SPINE WITHOUT CONTRAST TECHNIQUE: Multidetector CT imaging of the head, cervical spine, and maxillofacial structures were performed using the standard protocol without intravenous contrast. Multiplanar CT image reconstructions of the cervical spine and maxillofacial structures were also generated. RADIATION DOSE REDUCTION: This exam was performed according to the departmental dose-optimization program which includes automated exposure control, adjustment of the mA and/or kV according to patient size and/or use of iterative reconstruction technique. COMPARISON:  MRI 12/10/2023, CT brain and cervical spine 09/23/2023 FINDINGS: CT HEAD FINDINGS Brain: No acute territorial infarction, hemorrhage or intracranial mass. Advanced atrophy. Moderate severe chronic small vessel ischemic changes of the white matter. The ventricles are enlarged but stable in size, presumably due to atrophy. Vascular: No hyperdense vessels. Vertebral and carotid vascular calcification Skull: Normal. Negative for fracture or focal lesion. Other: None CT MAXILLOFACIAL FINDINGS Osseous: Slightly limited by motion degradation. Mastoid air cells are clear. No definitive mandibular  fracture. Pterygoid plates and zygomatic arches are intact. No acute nasal bone fracture Orbits: Negative. No traumatic or inflammatory finding. Sinuses: Clear. Soft tissues: Negative. CT CERVICAL SPINE FINDINGS Alignment: No subluxation.  Facet alignment is normal Skull base and vertebrae: No acute fracture. Vertebral body heights are grossly maintained Soft tissues and spinal canal: No prevertebral fluid or swelling. No visible canal hematoma. Disc levels: Advanced multilevel degenerative changes C3 through C7 with multilevel disc space narrowing, osteophyte and partial ankylosis. Irregular endplate erosive changes at C7-T1 with interval sclerosis, consistent with sequela of known osteomyelitis discitis. Multilevel facet degenerative change and ankylosis. Multilevel foraminal narrowing. Upper chest: Lung apices are clear.  Emphysema Other: None IMPRESSION: 1. No CT evidence for acute intracranial abnormality. Atrophy and chronic small vessel ischemic changes of the white matter. 2. No acute facial bone fracture allowing for motion degradation. 3. No acute osseous abnormality of the cervical spine. 4. Irregular  disc space disease at C7-T1 with endplate erosive change corresponding to history of known discitis osteomyelitis. 5. Emphysema Electronically Signed   By: Luke Bun M.D.   On: 09/09/2024 19:45    Alm Schneider, DO  [facility]  If 7PM-7AM, please contact night-coverage [email or web address] Password [email or web address] 09/16/2024, 2:58 PM   LOS: 6 days

## 2024-09-17 ENCOUNTER — Other Ambulatory Visit (HOSPITAL_COMMUNITY): Payer: Self-pay

## 2024-09-17 ENCOUNTER — Telehealth (HOSPITAL_COMMUNITY): Payer: Self-pay | Admitting: Pharmacy Technician

## 2024-09-17 DIAGNOSIS — A419 Sepsis, unspecified organism: Secondary | ICD-10-CM | POA: Diagnosis not present

## 2024-09-17 DIAGNOSIS — N179 Acute kidney failure, unspecified: Secondary | ICD-10-CM | POA: Diagnosis not present

## 2024-09-17 DIAGNOSIS — I480 Paroxysmal atrial fibrillation: Secondary | ICD-10-CM

## 2024-09-17 LAB — BASIC METABOLIC PANEL WITH GFR
Anion gap: 10 (ref 5–15)
BUN: 20 mg/dL (ref 8–23)
CO2: 24 mmol/L (ref 22–32)
Calcium: 8.4 mg/dL — ABNORMAL LOW (ref 8.9–10.3)
Chloride: 109 mmol/L (ref 98–111)
Creatinine, Ser: 0.96 mg/dL (ref 0.44–1.00)
GFR, Estimated: 60 mL/min — ABNORMAL LOW (ref >60)
Glucose, Bld: 124 mg/dL — ABNORMAL HIGH (ref 70–99)
Potassium: 3.8 mmol/L (ref 3.5–5.1)
Sodium: 143 mmol/L (ref 135–145)

## 2024-09-17 LAB — MAGNESIUM: Magnesium: 1.8 mg/dL (ref 1.7–2.4)

## 2024-09-17 MED ORDER — LACTATED RINGERS IV SOLN: INTRAVENOUS | Status: AC

## 2024-09-17 MED ORDER — POTASSIUM CHLORIDE CRYS ER 20 MEQ PO TBCR
40.0000 meq | EXTENDED_RELEASE_TABLET | Freq: Once | ORAL | Status: AC
  Administered 2024-09-17: 40 meq via ORAL
  Filled 2024-09-17: qty 2

## 2024-09-17 MED ORDER — APIXABAN 5 MG PO TABS
5.0000 mg | ORAL_TABLET | Freq: Two times a day (BID) | ORAL | Status: DC
  Administered 2024-09-17 – 2024-09-18 (×4): 5 mg via ORAL
  Filled 2024-09-17 (×4): qty 1

## 2024-09-17 MED ORDER — MAGNESIUM SULFATE 2 GM/50ML IV SOLN
2.0000 g | Freq: Once | INTRAVENOUS | Status: AC
  Administered 2024-09-17: 2 g via INTRAVENOUS
  Filled 2024-09-17: qty 50

## 2024-09-17 MED ORDER — DILTIAZEM HCL 30 MG PO TABS
30.0000 mg | ORAL_TABLET | Freq: Four times a day (QID) | ORAL | Status: DC
  Administered 2024-09-17 – 2024-09-18 (×5): 30 mg via ORAL
  Filled 2024-09-17 (×5): qty 1

## 2024-09-17 MED ORDER — LACTATED RINGERS IV BOLUS
1000.0000 mL | Freq: Once | INTRAVENOUS | Status: AC
  Administered 2024-09-17: 1000 mL via INTRAVENOUS

## 2024-09-17 NOTE — Telephone Encounter (Signed)
 Pharmacy Patient Advocate Encounter  Insurance verification completed.    The patient is insured through [facility].     Ran test claim for Eliquis  5mg  tablets and the current 30 day co-pay is $0.00.   This test claim was processed through [facility]- copay amounts may vary at other pharmacies due to pharmacy/plan contracts, or as the patient moves through the different stages of their insurance plan.

## 2024-09-17 NOTE — Progress Notes (Addendum)
 PROGRESS NOTE  Katelyn Ballard FMW:[id number] DOB: 01-Mar-1945 DOA: 09/09/2024 PCP: Bluford Carlin LABOR, MD  Brief History:  79 y.o. female with a history of dementia, stroke CAD s/p PCI, diastolic heart failure, mitral valve regurgitation, hypertension, hyperlipidemia, diabetes mellitus type 2, CKD stage III.  Patient presented secondary to fall with altered mental status and concern for metabolic encephalopathy secondary to UTI. Empiric antibiotics started. During hospitalization, patient developed worsening hypotension, consistent with severe sepsis, and was transferred to stepdown/ICU. Goals of care discussions with palliative medicine team with expressed understanding of not escalating care should patient decompensate.  The Plan is continuing to treat the treatable and optimizing patient until she can be discharged to SNF where hospice/palliative will follow.   Assessment/Plan:  Severe sepsis with septic shock  -initially on IV pressors -lactic acidosis resolved with IV fluid hydration -discussed with palliative team after family meeting, no escalation of care, daughters to discuss further with sons regarding residential hospice placement.  -sepsis physiology has resolved   E coli UTI - multidrug resistant organism -- continue IV meropenem  - finish 7 days on 09/17/24  Atrial fibrillation with RVR -Echo -start diltiazem  -9/18 discussed risks/benefits/alternatives with daughter regarding apixaban .  Daughter wants to start apixaban  -CHADSVASC2 = 8    Acute metabolic encephalopathy  -- improving  -- remains encephalopathic but slowly improving   Hypokalemia --repleted   Hypomagnesemia -- IV replacement ordered    Hypernatremia --resolved with increased free water    AKI on CKD stage 3a -- baseline creatinine 0.9-1.2 --serum creatinine peaked 1.96 --due to sepsis/hypotension and volume depletion   Type 2 DM  --controlled --09/11/24 A1C = 5.3 --stop SSI    Thrombocytopenia -- DC subcutaneous heparin  and place SCDs -due sepsis -improving -no sign of bleeding             Family Communication:   daughter 9/18   Consultants:  palliative   Code Status:  DNR   DVT Prophylaxis:  SCDs     Procedures: As Listed in Progress Note Above   Antibiotics: Merrem  9/12>>        Subjective: Pt is pleasantly confused.  Denies sob, abd pain, cp.  Remainder ROS not possible due to dementia  Objective: Vitals:   09/16/24 2046 09/16/24 2353 09/17/24 0444 09/17/24 0811  BP: (!) 122/93 120/80 111/73 (!) 114/96  Pulse: (!) 112 81 94 78  Resp:  15  18  Temp:  98.3 F (36.8 C) (!) 97.3 F (36.3 C) 98.1 F (36.7 C)  TempSrc:  Oral Oral Oral  SpO2: 98% 97% 96%   Weight:      Height:        Intake/Output Summary (Last 24 hours) at 09/17/2024 1012 Last data filed at 09/17/2024 0900 Gross per 24 hour  Intake 120 ml  Output --  Net 120 ml   Weight change:  Exam:  General:  Pt is alert, follows commands appropriately, not in acute distress HEENT: No icterus, No thrush, No neck mass, [place]/AT Cardiovascular: IRRR, S1/S2, no rubs, no gallops Respiratory: bibasilar crackles.  No wheeze Abdomen: Soft/+BS, non tender, non distended, no guarding Extremities: No edema, No lymphangitis, No petechiae, No rashes, no synovitis   Data Reviewed: I have personally reviewed following labs and imaging studies Basic Metabolic Panel: Recent Labs  Lab 09/11/24 0613 09/12/24 0538 09/13/24 0544 09/14/24 0512 09/16/24 0442  NA 139 140 139 143 143  K 4.0 3.7 3.4* 4.3 3.8  CL 108 111 108 116* 110  CO2 23 22 22 23 26   GLUCOSE 151* 161* 154* 85 94  BUN 39* 32* 23 21 21   CREATININE 1.96* 1.47* 1.28* 1.06* 1.04*  CALCIUM  8.0* 8.2* 8.3* 8.5* 8.5*  MG  --   --  1.6* 2.4  --    Liver Function Tests: No results for input(s): AST, ALT, ALKPHOS, BILITOT, PROT, ALBUMIN in the last 168 hours. No results for input(s): LIPASE, AMYLASE in  the last 168 hours. No results for input(s): AMMONIA in the last 168 hours. Coagulation Profile: No results for input(s): INR, PROTIME in the last 168 hours. CBC: Recent Labs  Lab 09/11/24 0613 09/12/24 0538 09/13/24 0544 09/14/24 0512 09/16/24 0442  WBC 25.0* 18.3* 11.1* 8.3 9.5  NEUTROABS  --  15.1* 7.9* 4.7  --   HGB 9.9* 9.7* 9.5* 9.5* 10.1*  HCT 31.7* 31.5* 30.7* 31.5* 32.6*  MCV 96.1 96.6 95.6 95.2 94.8  PLT 98* 93* 79* 91* 119*   Cardiac Enzymes: No results for input(s): CKTOTAL, CKMB, CKMBINDEX, TROPONINI in the last 168 hours. BNP: Invalid input(s): POCBNP CBG: Recent Labs  Lab 09/15/24 0734 09/15/24 1151 09/15/24 1641 09/16/24 0803 09/16/24 1140  GLUCAP 99 159* 119* 101* 145*   HbA1C: No results for input(s): HGBA1C in the last 72 hours. Urine analysis:    Component Value Date/Time   COLORURINE YELLOW 09/09/2024 2000   APPEARANCEUR TURBID (A) 09/09/2024 2000   LABSPEC 1.014 09/09/2024 2000   PHURINE 5.0 09/09/2024 2000   GLUCOSEU NEGATIVE 09/09/2024 2000   HGBUR NEGATIVE 09/09/2024 2000   BILIRUBINUR NEGATIVE 09/09/2024 2000   KETONESUR NEGATIVE 09/09/2024 2000   PROTEINUR 30 (A) 09/09/2024 2000   NITRITE POSITIVE (A) 09/09/2024 2000   LEUKOCYTESUR MODERATE (A) 09/09/2024 2000   Sepsis Labs: @LABRCNTIP (procalcitonin:4,lacticidven:4) ) Recent Results (from the past 240 hours)  Urine Culture     Status: Abnormal   Collection Time: 09/09/24  8:00 PM   Specimen: Urine, Clean Catch  Result Value Ref Range Status   Specimen Description   Final    URINE, CLEAN CATCH Performed at [facility], [address] [place], [place] [address]    Special Requests   Final    NONE Performed at [facility], [address] [place], [place] [address]    Culture (A)  Final    >=100,000 COLONIES/mL ESCHERICHIA COLI Confirmed Extended Spectrum Beta-Lactamase Producer (ESBL).  In bloodstream infections from ESBL organisms, carbapenems are  preferred over piperacillin/tazobactam. They are shown to have a lower risk of mortality.    Report Status 09/12/2024 FINAL  Final   Organism ID, Bacteria ESCHERICHIA COLI (A)  Final      Susceptibility   Escherichia coli - MIC*    AMPICILLIN >=32 RESISTANT Resistant     CEFAZOLIN (URINE) Value in next row Resistant      >=32 RESISTANTThis is a modified FDA-approved test that has been validated and its performance characteristics determined by the reporting laboratory.  This laboratory is certified under the Clinical Laboratory Improvement Amendments CLIA as qualified to perform high complexity clinical laboratory testing.    CEFEPIME  Value in next row Resistant      >=32 RESISTANTThis is a modified FDA-approved test that has been validated and its performance characteristics determined by the reporting laboratory.  This laboratory is certified under the Clinical Laboratory Improvement Amendments CLIA as qualified to perform high complexity clinical laboratory testing.    ERTAPENEM Value in next row Sensitive      >=  32 RESISTANTThis is a modified FDA-approved test that has been validated and its performance characteristics determined by the reporting laboratory.  This laboratory is certified under the Clinical Laboratory Improvement Amendments CLIA as qualified to perform high complexity clinical laboratory testing.    CEFTRIAXONE  Value in next row Resistant      >=32 RESISTANTThis is a modified FDA-approved test that has been validated and its performance characteristics determined by the reporting laboratory.  This laboratory is certified under the Clinical Laboratory Improvement Amendments CLIA as qualified to perform high complexity clinical laboratory testing.    CIPROFLOXACIN Value in next row Resistant      >=32 RESISTANTThis is a modified FDA-approved test that has been validated and its performance characteristics determined by the reporting laboratory.  This laboratory is certified under  the Clinical Laboratory Improvement Amendments CLIA as qualified to perform high complexity clinical laboratory testing.    GENTAMICIN Value in next row Sensitive      >=32 RESISTANTThis is a modified FDA-approved test that has been validated and its performance characteristics determined by the reporting laboratory.  This laboratory is certified under the Clinical Laboratory Improvement Amendments CLIA as qualified to perform high complexity clinical laboratory testing.    NITROFURANTOIN Value in next row Sensitive      >=32 RESISTANTThis is a modified FDA-approved test that has been validated and its performance characteristics determined by the reporting laboratory.  This laboratory is certified under the Clinical Laboratory Improvement Amendments CLIA as qualified to perform high complexity clinical laboratory testing.    TRIMETH/SULFA Value in next row Resistant      >=32 RESISTANTThis is a modified FDA-approved test that has been validated and its performance characteristics determined by the reporting laboratory.  This laboratory is certified under the Clinical Laboratory Improvement Amendments CLIA as qualified to perform high complexity clinical laboratory testing.    AMPICILLIN/SULBACTAM Value in next row Resistant      >=32 RESISTANTThis is a modified FDA-approved test that has been validated and its performance characteristics determined by the reporting laboratory.  This laboratory is certified under the Clinical Laboratory Improvement Amendments CLIA as qualified to perform high complexity clinical laboratory testing.    PIP/TAZO Value in next row Sensitive ug/mL     8 SENSITIVEThis is a modified FDA-approved test that has been validated and its performance characteristics determined by the reporting laboratory.  This laboratory is certified under the Clinical Laboratory Improvement Amendments CLIA as qualified to perform high complexity clinical laboratory testing.    MEROPENEM  Value in  next row Sensitive      8 SENSITIVEThis is a modified FDA-approved test that has been validated and its performance characteristics determined by the reporting laboratory.  This laboratory is certified under the Clinical Laboratory Improvement Amendments CLIA as qualified to perform high complexity clinical laboratory testing.    * >=100,000 COLONIES/mL ESCHERICHIA COLI  MRSA Next Gen by PCR, Nasal     Status: None   Collection Time: 09/10/24  3:10 PM   Specimen: Nasal Mucosa; Nasal Swab  Result Value Ref Range Status   MRSA by PCR Next Gen NOT DETECTED NOT DETECTED Final    Comment: (NOTE) The GeneXpert MRSA Assay (FDA approved for NASAL specimens only), is one component of a comprehensive MRSA colonization surveillance program. It is not intended to diagnose MRSA infection nor to guide or monitor treatment for MRSA infections. Test performance is not FDA approved in patients less than 47 years old. Performed at [facility], [address],  [place], [place] [address]   Culture, blood (Routine X 2) w Reflex to ID Panel     Status: None   Collection Time: 09/10/24  3:34 PM   Specimen: BLOOD  Result Value Ref Range Status   Specimen Description   Final    BLOOD LEFT ANTECUBITAL Performed at [facility], [address], [place], [place] [address]    Special Requests   Final    BOTTLES DRAWN AEROBIC ONLY Blood Culture adequate volume Performed at [facility], [address] [place], [place] [address]    Culture   Final    NO GROWTH 5 DAYS Performed at [facility], [address], [place], [place] [address]    Report Status 09/15/2024 FINAL  Final  Culture, blood (Routine X 2) w Reflex to ID Panel     Status: None   Collection Time: 09/10/24  3:34 PM   Specimen: BLOOD  Result Value Ref Range Status   Specimen Description   Final    BLOOD BLOOD LEFT HAND Performed at [facility], [address] [place], [place] [address]    Special Requests   Final    BOTTLES DRAWN  AEROBIC ONLY Blood Culture results may not be optimal due to an inadequate volume of blood received in culture bottles Performed at [facility], [address] [place], [place] [address]    Culture   Final    NO GROWTH 5 DAYS Performed at [facility], [address] [place], [place] [address]    Report Status 09/15/2024 FINAL  Final     Scheduled Meds:  allopurinol   300 mg Oral Daily   apixaban   5 mg Oral BID   Chlorhexidine  Gluconate Cloth  6 each Topical Daily   clopidogrel   75 mg Oral q AM   diltiazem   30 mg Oral Q6H   divalproex   375 mg Oral TID   feeding supplement  237 mL Oral BID BM   melatonin  6 mg Oral QHS   mirtazapine   15 mg Oral QHS   rosuvastatin   10 mg Oral QPM   Continuous Infusions:  meropenem  (MERREM ) IV 1 g (09/17/24 0224)    Procedures/Studies: DG CHEST PORT 1 VIEW Result Date: 09/11/2024 CLINICAL DATA:  Hypotension. EXAM: PORTABLE CHEST 1 VIEW COMPARISON:  September 23, 2023. FINDINGS: Stable cardiomediastinal silhouette. Minimal bibasilar subsegmental atelectasis or scarring is noted. Bony thorax is unremarkable. IMPRESSION: Minimal bibasilar subsegmental atelectasis or scarring. Electronically Signed   By: Lynwood Landy Raddle M.D.   On: 09/11/2024 11:24   CT ABDOMEN PELVIS WO CONTRAST Result Date: 09/10/2024 CLINICAL DATA:  Acute kidney injury assess for intra-abdominal infection EXAM: CT ABDOMEN AND PELVIS WITHOUT CONTRAST TECHNIQUE: Multidetector CT imaging of the abdomen and pelvis was performed following the standard protocol without IV contrast. RADIATION DOSE REDUCTION: This exam was performed according to the departmental dose-optimization program which includes automated exposure control, adjustment of the mA and/or kV according to patient size and/or use of iterative reconstruction technique. COMPARISON:  None Available. FINDINGS: Lower chest: Lung bases demonstrate no acute airspace disease. Atelectasis at the lung bases. Multi-vessel coronary  vascular calcification. Hepatobiliary: Motion degradation. Gallstones. No biliary dilatation. Pancreas: Unremarkable. No pancreatic ductal dilatation or surrounding inflammatory changes. Spleen: Normal in size without focal abnormality. Adrenals/Urinary Tract: Adrenal glands are normal. Kidneys show no hydronephrosis. Multiple cysts in the left kidney, no imaging follow-up is recommended. The bladder is unremarkable Stomach/Bowel: The stomach is within normal limits. No dilated small bowel. No  acute bowel wall thickening. Negative appendix. Mild diverticular disease of the left colon Vascular/Lymphatic: Aortic atherosclerosis. No enlarged abdominal or pelvic lymph nodes. Reproductive: Hysterectomy.  No adnexal mass Other: Negative for pelvic effusion or free air. Moderate fat containing periumbilical hernia Musculoskeletal: No acute or suspicious osseous abnormality IMPRESSION: 1. No CT evidence for acute intra-abdominal or pelvic abnormality allowing for motion degradation. 2. Gallstones. 3. Mild diverticular disease of the left colon without acute inflammatory process. 4. Aortic atherosclerosis. Aortic Atherosclerosis (ICD10-I70.0). Electronically Signed   By: Luke Bun M.D.   On: 09/10/2024 23:44   CT Head Wo Contrast Result Date: 09/09/2024 CLINICAL DATA:  Fall with neck pain hematoma to right eye and forehead EXAM: CT HEAD WITHOUT CONTRAST CT MAXILLOFACIAL WITHOUT CONTRAST CT CERVICAL SPINE WITHOUT CONTRAST TECHNIQUE: Multidetector CT imaging of the head, cervical spine, and maxillofacial structures were performed using the standard protocol without intravenous contrast. Multiplanar CT image reconstructions of the cervical spine and maxillofacial structures were also generated. RADIATION DOSE REDUCTION: This exam was performed according to the departmental dose-optimization program which includes automated exposure control, adjustment of the mA and/or kV according to patient size and/or use of iterative  reconstruction technique. COMPARISON:  MRI 12/10/2023, CT brain and cervical spine 09/23/2023 FINDINGS: CT HEAD FINDINGS Brain: No acute territorial infarction, hemorrhage or intracranial mass. Advanced atrophy. Moderate severe chronic small vessel ischemic changes of the white matter. The ventricles are enlarged but stable in size, presumably due to atrophy. Vascular: No hyperdense vessels. Vertebral and carotid vascular calcification Skull: Normal. Negative for fracture or focal lesion. Other: None CT MAXILLOFACIAL FINDINGS Osseous: Slightly limited by motion degradation. Mastoid air cells are clear. No definitive mandibular fracture. Pterygoid plates and zygomatic arches are intact. No acute nasal bone fracture Orbits: Negative. No traumatic or inflammatory finding. Sinuses: Clear. Soft tissues: Negative. CT CERVICAL SPINE FINDINGS Alignment: No subluxation.  Facet alignment is normal Skull base and vertebrae: No acute fracture. Vertebral body heights are grossly maintained Soft tissues and spinal canal: No prevertebral fluid or swelling. No visible canal hematoma. Disc levels: Advanced multilevel degenerative changes C3 through C7 with multilevel disc space narrowing, osteophyte and partial ankylosis. Irregular endplate erosive changes at C7-T1 with interval sclerosis, consistent with sequela of known osteomyelitis discitis. Multilevel facet degenerative change and ankylosis. Multilevel foraminal narrowing. Upper chest: Lung apices are clear.  Emphysema Other: None IMPRESSION: 1. No CT evidence for acute intracranial abnormality. Atrophy and chronic small vessel ischemic changes of the white matter. 2. No acute facial bone fracture allowing for motion degradation. 3. No acute osseous abnormality of the cervical spine. 4. 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 5. Emphysema Electronically Signed   By: Luke Bun M.D.   On:  09/09/2024 19:45   CT Maxillofacial Wo Contrast Result Date: 09/09/2024 CLINICAL DATA:  Fall with neck pain hematoma to right eye and forehead EXAM: CT HEAD WITHOUT CONTRAST CT MAXILLOFACIAL WITHOUT CONTRAST CT CERVICAL SPINE WITHOUT CONTRAST TECHNIQUE: Multidetector CT imaging of the head, cervical spine, and maxillofacial structures were performed using the standard protocol without intravenous contrast. Multiplanar CT image reconstructions of the cervical spine and maxillofacial structures were also generated. RADIATION DOSE REDUCTION: This exam was performed according to the departmental dose-optimization program which includes automated exposure control, adjustment of the mA and/or kV according to patient size and/or use of iterative reconstruction technique. COMPARISON:  MRI 12/10/2023, CT brain and cervical spine 09/23/2023 FINDINGS: CT HEAD FINDINGS Brain: No  acute territorial infarction, hemorrhage or intracranial mass. Advanced atrophy. Moderate severe chronic small vessel ischemic changes of the white matter. The ventricles are enlarged but stable in size, presumably due to atrophy. Vascular: No hyperdense vessels. Vertebral and carotid vascular calcification Skull: Normal. Negative for fracture or focal lesion. Other: None CT MAXILLOFACIAL FINDINGS Osseous: Slightly limited by motion degradation. Mastoid air cells are clear. No definitive mandibular fracture. Pterygoid plates and zygomatic arches are intact. No acute nasal bone fracture Orbits: Negative. No traumatic or inflammatory finding. Sinuses: Clear. Soft tissues: Negative. CT CERVICAL SPINE FINDINGS Alignment: No subluxation.  Facet alignment is normal Skull base and vertebrae: No acute fracture. Vertebral body heights are grossly maintained Soft tissues and spinal canal: No prevertebral fluid or swelling. No visible canal hematoma. Disc levels: Advanced multilevel degenerative changes C3 through C7 with multilevel disc space narrowing,  osteophyte and partial ankylosis. Irregular endplate erosive changes at C7-T1 with interval sclerosis, consistent with sequela of known osteomyelitis discitis. Multilevel facet degenerative change and ankylosis. Multilevel foraminal narrowing. Upper chest: Lung apices are clear.  Emphysema Other: None IMPRESSION: 1. No CT evidence for acute intracranial abnormality. Atrophy and chronic small vessel ischemic changes of the white matter. 2. No acute facial bone fracture allowing for motion degradation. 3. No acute osseous abnormality of the cervical spine. 4. 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 5. Emphysema Electronically Signed   By: Luke Bun M.D.   On: 09/09/2024 19:45   CT Cervical Spine Wo Contrast Result Date: 09/09/2024 CLINICAL DATA:  Fall with neck pain hematoma to right eye and forehead EXAM: CT HEAD WITHOUT CONTRAST CT MAXILLOFACIAL WITHOUT CONTRAST CT CERVICAL SPINE WITHOUT CONTRAST TECHNIQUE: Multidetector CT imaging of the head, cervical spine, and maxillofacial structures were performed using the standard protocol without intravenous contrast. Multiplanar CT image reconstructions of the cervical spine and maxillofacial structures were also generated. RADIATION DOSE REDUCTION: This exam was performed according to the departmental dose-optimization program which includes automated exposure control, adjustment of the mA and/or kV according to patient size and/or use of iterative reconstruction technique. COMPARISON:  MRI 12/10/2023, CT brain and cervical spine 09/23/2023 FINDINGS: CT HEAD FINDINGS Brain: No acute territorial infarction, hemorrhage or intracranial mass. Advanced atrophy. Moderate severe chronic small vessel ischemic changes of the white matter. The ventricles are enlarged but stable in size, presumably due to atrophy. Vascular: No hyperdense vessels. Vertebral and carotid vascular calcification  Skull: Normal. Negative for fracture or focal lesion. Other: None CT MAXILLOFACIAL FINDINGS Osseous: Slightly limited by motion degradation. Mastoid air cells are clear. No definitive mandibular fracture. Pterygoid plates and zygomatic arches are intact. No acute nasal bone fracture Orbits: Negative. No traumatic or inflammatory finding. Sinuses: Clear. Soft tissues: Negative. CT CERVICAL SPINE FINDINGS Alignment: No subluxation.  Facet alignment is normal Skull base and vertebrae: No acute fracture. Vertebral body heights are grossly maintained Soft tissues and spinal canal: No prevertebral fluid or swelling. No visible canal hematoma. Disc levels: Advanced multilevel degenerative changes C3 through C7 with multilevel disc space narrowing, osteophyte and partial ankylosis. Irregular endplate erosive changes at C7-T1 with interval sclerosis, consistent with sequela of known osteomyelitis discitis. Multilevel facet degenerative change and ankylosis. Multilevel foraminal narrowing. Upper chest: Lung apices are clear.  Emphysema Other: None IMPRESSION: 1. No CT evidence for acute intracranial abnormality. Atrophy and chronic small vessel ischemic changes of the white matter. 2. No acute facial bone fracture allowing for motion degradation. 3. No acute  osseous abnormality of the cervical spine. 4. 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 5. Emphysema Electronically Signed   By: Luke Bun M.D.   On: 09/09/2024 19:45    Alm Schneider, DO  [facility]  If 7PM-7AM, please contact night-coverage [email or web address] Password [email or web address] 09/17/2024, 10:12 AM   LOS: 7 days

## 2024-09-17 NOTE — Progress Notes (Addendum)
 Afternoon Rounds   Subjective: Pt awake and alert.  Conversant.  Pleasantly confused  Vitals:   09/17/24 0444 09/17/24 0811 09/17/24 1147 09/17/24 1150  BP: 111/73 (!) 114/96 104/88 97/66  Pulse: 94 78 99   Resp:  18 19   Temp: (!) 97.3 F (36.3 C) 98.1 F (36.7 C) 97.6 F (36.4 C)   TempSrc: Oral Oral Oral   SpO2: 96%     Weight:      Height:       CV--IRRR Lung--fine bibasilar rales. Abd--soft+BS/NT   Assessment/Plan: Afib with RVR -continue diltiazem  30 mg q 6 -HR 110s -bolused 1L -restarted IVF -notified by RN earlier has not urinated today but purewick and cloth around it is wet.  bladder scanned and was 79ml multiple times  -am BMP -daughter at bedside updated and questions answered -daughter states that this is the most alert she has seen the patient the entire hospitalization     Alm Schneider, DO [facility]

## 2024-09-18 ENCOUNTER — Other Ambulatory Visit (HOSPITAL_COMMUNITY): Payer: Self-pay | Admitting: *Deleted

## 2024-09-18 ENCOUNTER — Inpatient Hospital Stay (HOSPITAL_COMMUNITY)

## 2024-09-18 DIAGNOSIS — M7989 Other specified soft tissue disorders: Secondary | ICD-10-CM | POA: Diagnosis not present

## 2024-09-18 DIAGNOSIS — A419 Sepsis, unspecified organism: Secondary | ICD-10-CM | POA: Diagnosis not present

## 2024-09-18 DIAGNOSIS — I4891 Unspecified atrial fibrillation: Secondary | ICD-10-CM | POA: Diagnosis not present

## 2024-09-18 DIAGNOSIS — M19072 Primary osteoarthritis, left ankle and foot: Secondary | ICD-10-CM | POA: Diagnosis not present

## 2024-09-18 DIAGNOSIS — M7732 Calcaneal spur, left foot: Secondary | ICD-10-CM | POA: Diagnosis not present

## 2024-09-18 DIAGNOSIS — N179 Acute kidney failure, unspecified: Secondary | ICD-10-CM | POA: Diagnosis not present

## 2024-09-18 DIAGNOSIS — I480 Paroxysmal atrial fibrillation: Secondary | ICD-10-CM | POA: Diagnosis not present

## 2024-09-18 DIAGNOSIS — M25572 Pain in left ankle and joints of left foot: Secondary | ICD-10-CM | POA: Diagnosis not present

## 2024-09-18 DIAGNOSIS — M2012 Hallux valgus (acquired), left foot: Secondary | ICD-10-CM | POA: Diagnosis not present

## 2024-09-18 LAB — BASIC METABOLIC PANEL WITH GFR
Anion gap: 5 (ref 5–15)
BUN: 18 mg/dL (ref 8–23)
CO2: 26 mmol/L (ref 22–32)
Calcium: 8.1 mg/dL — ABNORMAL LOW (ref 8.9–10.3)
Chloride: 113 mmol/L — ABNORMAL HIGH (ref 98–111)
Creatinine, Ser: 0.92 mg/dL (ref 0.44–1.00)
GFR, Estimated: >60 mL/min (ref >60)
Glucose, Bld: 74 mg/dL (ref 70–99)
Potassium: 4 mmol/L (ref 3.5–5.1)
Sodium: 144 mmol/L (ref 135–145)

## 2024-09-18 LAB — ECHOCARDIOGRAM COMPLETE
AR max vel: 1.9 cm2
AV Area VTI: 1.72 cm2
AV Area mean vel: 1.75 cm2
AV Mean grad: 3.5 mmHg
AV Peak grad: 7.4 mmHg
Ao pk vel: 1.36 m/s
Area-P 1/2: 4.06 cm2
Height: 68 in
MV M vel: 4.07 m/s
MV Peak grad: 66.3 mmHg
Radius: 0.6 cm
S' Lateral: 3.2 cm
Weight: 2289.26 [oz_av]

## 2024-09-18 LAB — CBC
HCT: 30 % — ABNORMAL LOW (ref 36.0–46.0)
Hemoglobin: 9.3 g/dL — ABNORMAL LOW (ref 12.0–15.0)
MCH: 29.9 pg (ref 26.0–34.0)
MCHC: 31 g/dL (ref 30.0–36.0)
MCV: 96.5 fL (ref 80.0–100.0)
Platelets: 179 K/uL (ref 150–400)
RBC: 3.11 MIL/uL — ABNORMAL LOW (ref 3.87–5.11)
RDW: 16.9 % — ABNORMAL HIGH (ref 11.5–15.5)
WBC: 7.6 K/uL (ref 4.0–10.5)
nRBC: 0.8 % — ABNORMAL HIGH (ref 0.0–0.2)

## 2024-09-18 LAB — URIC ACID: Uric Acid, Serum: 2.3 mg/dL — ABNORMAL LOW (ref 2.5–7.1)

## 2024-09-18 LAB — MAGNESIUM: Magnesium: 2.1 mg/dL (ref 1.7–2.4)

## 2024-09-18 MED ORDER — CLONAZEPAM 0.5 MG PO TABS: 0.5000 mg | ORAL_TABLET | Freq: Three times a day (TID) | ORAL | 0 refills | Status: AC | PRN

## 2024-09-18 MED ORDER — ENSURE PLUS HIGH PROTEIN PO LIQD: 237.0000 mL | Freq: Two times a day (BID) | ORAL | Status: AC

## 2024-09-18 MED ORDER — OXYCODONE HCL 5 MG PO TABS: 5.0000 mg | ORAL_TABLET | Freq: Two times a day (BID) | ORAL | 0 refills | Status: AC | PRN

## 2024-09-18 MED ORDER — DILTIAZEM HCL ER COATED BEADS 120 MG PO CP24
120.0000 mg | ORAL_CAPSULE | Freq: Every day | ORAL | Status: DC
  Administered 2024-09-18: 120 mg via ORAL
  Filled 2024-09-18: qty 1

## 2024-09-18 MED ORDER — APIXABAN 5 MG PO TABS: 5.0000 mg | ORAL_TABLET | Freq: Two times a day (BID) | ORAL | Status: AC

## 2024-09-18 MED ORDER — DILTIAZEM HCL ER COATED BEADS 120 MG PO CP24: 120.0000 mg | ORAL_CAPSULE | Freq: Every day | ORAL | Status: AC

## 2024-09-18 NOTE — TOC Transition Note (Signed)
 Transition of Care [facility]) - Discharge Note   Patient Details  Name: Katelyn Ballard MRN: [id number] Date of Birth: 1945/02/16  [facility] CM/SW Contact:  Lucie Lunger, LCSWA Phone Number: 09/18/2024, 3:13 PM  Clinical Narrative:    CSW updated pt is medically stable for D/C back to [facility]. CSW sent D/C clinicals to facility via HUB. CSW updated [place] with facility of plan for D/C, they are ready for pt to return today. Pts daughter updated on plan for D/C. CSW to provide RN with room and report numbers. Med necessity to be printed to floor for RN. EMS to be called for transport. TOC signing off.   Final next level of care: Skilled Nursing Facility Barriers to Discharge: Barriers Resolved   Patient Goals and CMS Choice Patient states their goals for this hospitalization and ongoing recovery are:: return back to [facility] Compare Post Acute Care list provided to:: Patient Represenative (must comment) Choice offered to / list presented to : Adult Children      Discharge Placement              Patient chooses bed at: [facility] Patient to be transferred to facility by: EMS Name of family member notified: daughter Patient and family notified of of transfer: 09/18/24  Discharge Plan and Services Additional resources added to the After Visit Summary for       Post Acute Care Choice: Durable Medical Equipment, Skilled Nursing Facility                               Social Drivers of Health (SDOH) Interventions SDOH Screenings   Food Insecurity: Patient Unable To Answer (09/10/2024)  Housing: Patient Unable To Answer (09/10/2024)  Transportation Needs: Patient Unable To Answer (09/10/2024)  Utilities: Patient Unable To Answer (09/10/2024)  Depression (PHQ2-9): Low Risk  (12/26/2023)  Recent Concern: Depression (PHQ2-9) - Medium Risk (12/11/2023)  Financial Resource Strain: Low Risk  (08/06/2024)   Received from [facility]  Physical  Activity: Insufficiently Active (02/05/2022)  Social Connections: Patient Unable To Answer (09/10/2024)  Stress: No Stress Concern Present (02/05/2022)   Received from [facility]  Recent Concern: Stress - Stress Concern Present (02/05/2022)  Tobacco Use: Medium Risk (09/09/2024)     Readmission Risk Interventions     No data to display

## 2024-09-18 NOTE — Progress Notes (Signed)
*  PRELIMINARY RESULTS* Echocardiogram 2D Echocardiogram has been performed.  Katelyn Ballard 09/18/2024, 12:21 PM

## 2024-09-18 NOTE — Discharge Summary (Signed)
 Physician Discharge Summary   Patient: Katelyn Ballard MRN: [id number] DOB: 08/21/1945  Admit date:     09/09/2024  Discharge date: 09/18/24  Discharge Physician: Alm Madilynne Mullan   PCP: Bluford Carlin LABOR, MD   Recommendations at discharge:   Please follow up with primary care provider within 1-2 weeks  Please repeat BMP and CBC in one week    Hospital Course: 79 y.o. female with a history of dementia, stroke CAD s/p PCI, diastolic heart failure, mitral valve regurgitation, hypertension, hyperlipidemia, diabetes mellitus type 2, CKD stage III.  Patient presented secondary to fall with altered mental status and concern for metabolic encephalopathy secondary to UTI. Empiric antibiotics started. During hospitalization, patient developed worsening hypotension, consistent with severe sepsis, and was transferred to stepdown/ICU. Goals of care discussions with palliative medicine team with expressed understanding of not escalating care should patient decompensate.  The Plan is continuing to treat the treatable and optimizing patient until she can be discharged to SNF where hospice/palliative will follow.  Assessment and Plan:     Severe sepsis with septic shock  -initially on IV pressors -lactic acidosis resolved with IV fluid hydration -discussed with palliative team after family meeting, no escalation of care, daughters to discuss further with sons regarding residential hospice placement.  -sepsis physiology has resolved   E coli UTI - multidrug resistant organism -- continue IV meropenem  - finished 7 days on 09/17/24   Paroxysmal Atrial fibrillation with RVR -Echo EF 60-65%, no WMA, normal RVF, mod MR/TR -started diltiazem >>diltiazem  CD -9/18 discussed risks/benefits/alternatives with daughter regarding apixaban .  Daughter wants to start apixaban  -CHADSVASC2 = 8 -converted to sinus on day of dc    Acute metabolic encephalopathy  -- improving  -- remains encephalopathic but slowly  improving --09/17/24 daughter states her mentation is the best she has seen it throughout the hospitalization   Hypokalemia --repleted   Hypomagnesemia -- IV replacement ordered    Hypernatremia --resolved with increased free water    AKI on CKD stage 3a -- baseline creatinine 0.9-1.2 --serum creatinine peaked 1.96 --due to sepsis/hypotension and volume depletion - serum creatinine 0.92 on day of dc   Type 2 DM  --controlled --09/11/24 A1C = 5.3 --stop SSI   Thrombocytopenia -- DC subcutaneous heparin  and place SCDs -due sepsis -improved>>179 on day of dc -no sign of bleeding  Left foot/ankle pain -xrays of foot/ankle>>no osseus abnormalities -venous duplex LLE-- No thrombus seen within the LEFT common femoral vein and saphenofemoral junction. Patient refused evaluation of remaining LEFT lower extremity veins -already on apixaban  -uric acid 2.3  Social -GOC discussed with family by palliative -pt confirmed DNR    Consultants: palliative Procedures performed: none  Disposition: Skilled nursing facility Diet recommendation:  Dysphagia type 2 thin Liquid DISCHARGE MEDICATION: Allergies as of 09/18/2024       Reactions   Atorvastatin  Other (See Comments)   Myalgias    Penicillins Other (See Comments)   Unknown    Shellfish Allergy Other (See Comments)   Unknown        Medication List     STOP taking these medications    Dextromethorphan-quiNIDine 20-10 MG capsule Commonly known as: NUEDEXTA   metoprolol  tartrate 25 MG tablet Commonly known as: LOPRESSOR        TAKE these medications    acetaminophen  325 MG tablet Commonly known as: TYLENOL  Take 650 mg by mouth every 4 (four) hours as needed for mild pain (pain score 1-3).   allopurinol  300 MG tablet Commonly known as: ZYLOPRIM   Take 1 tablet (300 mg total) by mouth daily.   apixaban  5 MG Tabs tablet Commonly known as: ELIQUIS  Take 1 tablet (5 mg total) by mouth 2 (two) times daily.    blood glucose meter kit and supplies Check blood sugar bid and as needed Dx: E11.42   blood glucose meter kit and supplies Dispense based on patient and insurance preference. Use up to four times daily as directed. (FOR ICD-10 E10.9, E11.9).   Calmoseptine 0.44-20.6 % Oint Generic drug: Menthol-Zinc Oxide Apply 1 Application topically in the morning, at noon, and at bedtime. Apply to inner buttocks, perineum every shift for moisture associated skin damage   cetirizine 10 MG tablet Commonly known as: ZYRTEC Take 10 mg by mouth daily as needed for allergies.   Cholecalciferol 50 MCG (2000 UT) Caps Take 2,000 Units by mouth daily.   clonazePAM  0.5 MG tablet Commonly known as: KLONOPIN  Take 1 tablet (0.5 mg total) by mouth 3 (three) times daily as needed (anxiety). What changed:  when to take this reasons to take this   clopidogrel  75 MG tablet Commonly known as: PLAVIX  Take 75 mg by mouth in the morning.   diltiazem  120 MG 24 hr capsule Commonly known as: CARDIZEM  CD Take 1 capsule (120 mg total) by mouth daily.   divalproex  125 MG capsule Commonly known as: DEPAKOTE  SPRINKLE Take 375 mg by mouth 3 (three) times daily.   escitalopram  10 MG tablet Commonly known as: LEXAPRO  Take 10 mg by mouth daily.   feeding supplement Liqd Take 237 mLs by mouth 2 (two) times daily between meals.   furosemide  20 MG tablet Commonly known as: LASIX  Take 20 mg by mouth daily.   melatonin 3 MG Tabs tablet Take 3 mg by mouth at bedtime.   methocarbamol 500 MG tablet Commonly known as: ROBAXIN Take 500 mg by mouth every evening.   mirtazapine  15 MG tablet Commonly known as: REMERON  Take 15 mg by mouth at bedtime.   ondansetron  4 MG disintegrating tablet Commonly known as: ZOFRAN -ODT Take 4 mg by mouth every 4 (four) hours as needed for nausea or vomiting. For 2 doses. If not resolved after 2 doses then Phenergan 25 mg (IM, PR, or per tube) PRN   OneTouch Ultra test  strip Generic drug: glucose blood USE 1 STRIP TO CHECK GLUCOSE 2 TO 3 TIMES DAILY   oxybutynin 5 MG tablet Commonly known as: DITROPAN Take 5 mg by mouth at bedtime.   oxyCODONE  5 MG immediate release tablet Commonly known as: Oxy IR/ROXICODONE  Take 5 mg by mouth daily at 2 PM.   oxyCODONE  5 MG immediate release tablet Commonly known as: Oxy IR/ROXICODONE  Take 1 tablet (5 mg total) by mouth every 12 (twelve) hours as needed for severe pain (pain score 7-10).   rosuvastatin  40 MG tablet Commonly known as: CRESTOR  Take 40 mg by mouth every evening.   sennosides-docusate sodium 8.6-50 MG tablet Commonly known as: SENOKOT-S Take 2 tablets by mouth in the morning.   Trulicity 0.75 MG/0.5ML Soaj Generic drug: Dulaglutide Inject 0.75 mg into the skin every Saturday.        Contact information for after-discharge care     Destination     [facility] .   Service: Skilled Nursing Contact information: [address] [place]  [phone]                    Discharge Exam: Katelyn Ballard   09/09/24 1706 09/18/24 0500  Weight:  70.3 kg 64.9 kg   HEENT:  [place]/AT, No thrush, no icterus CV:  RRR, no rub, no S3, no S4 Lung:  bibasilar crackles . No wheeze Abd:  soft/+BS, NT Ext:  trace LE edema, no lymphangitis, no synovitis, no rash   Condition at discharge: stable  The results of significant diagnostics from this hospitalization (including imaging, microbiology, ancillary and laboratory) are listed below for reference.   Imaging Studies: US  Venous Img Lower Unilateral Left (DVT) Result Date: 09/18/2024 CLINICAL DATA:  LEFT lower extremity pain EXAM: LEFT LOWER EXTREMITY VENOUS DOPPLER ULTRASOUND TECHNIQUE: Gray-scale sonography with compression, as well as color and duplex ultrasound, were performed to evaluate the deep venous system(s) from at the level of the common femoral vein. Patient refused evaluation of remaining venous structures  of the LEFT lower extremity. COMPARISON:  None available FINDINGS: VENOUS Normal color Doppler flow and spectral waveform of the common femoral vein and saphenofemoral junction. Patient refused evaluation of the remainder of the LEFT lower extremity veins. Limited views of the contralateral common femoral vein are unremarkable. OTHER None. Limitations: none IMPRESSION: Limited evaluation. No thrombus seen within the LEFT common femoral vein and saphenofemoral junction. Patient refused evaluation of remaining LEFT lower extremity veins. Electronically Signed   By: Aliene Lloyd M.D.   On: 09/18/2024 14:06   ECHOCARDIOGRAM COMPLETE Result Date: 09/18/2024    ECHOCARDIOGRAM REPORT   Patient Name:   FELMA PFEFFERLE Date of Exam: 09/18/2024 Medical Rec #:  [id number]        Height:       68.0 in Accession #:    [id number]       Weight:       143.1 lb Date of Birth:  March 07, 1945        BSA:          1.773 m Patient Age:    79 years         BP:           120/71 mmHg Patient Gender: F                HR:           85 bpm. Exam Location:  [facility] Procedure: 2D Echo, Cardiac Doppler and Color Doppler (Both Spectral and Color            Flow Doppler were utilized during procedure). Indications:    Atrial Fibrillation I48.91  History:        Patient has no prior history of Echocardiogram examinations.                 TIA, Arrythmias:Atrial Fibrillation; Risk Factors:Hypertension,                 Diabetes and Dyslipidemia. Hx of COVID-19.  Sonographer:    Aida Pizza RCS Referring Phys: [phone] Copeland Neisen IMPRESSIONS  1. Left ventricular ejection fraction, by estimation, is 60 to 65%. The left ventricle has normal function. The left ventricle has no regional wall motion abnormalities. Left ventricular diastolic parameters are indeterminate.  2. Right ventricular systolic function is normal. The right ventricular size is mildly enlarged. There is mildly elevated pulmonary artery systolic pressure.  3. Left atrial size was  severely dilated.  4. Right atrial size was mildly dilated.  5. The mitral valve is abnormal. Moderate mitral valve regurgitation. No evidence of mitral stenosis.  6. The tricuspid valve is abnormal. Tricuspid valve regurgitation is moderate.  7. The aortic valve is tricuspid. There is  mild calcification of the aortic valve. There is mild thickening of the aortic valve. Aortic valve regurgitation is not visualized.  8. The inferior vena cava is normal in size with greater than 50% respiratory variability, suggesting right atrial pressure of 3 mmHg. FINDINGS  Left Ventricle: Left ventricular ejection fraction, by estimation, is 60 to 65%. The left ventricle has normal function. The left ventricle has no regional wall motion abnormalities. The left ventricular internal cavity size was normal in size. There is  no left ventricular hypertrophy. Left ventricular diastolic parameters are indeterminate. Right Ventricle: The right ventricular size is mildly enlarged. Right vetricular wall thickness was not well visualized. Right ventricular systolic function is normal. There is mildly elevated pulmonary artery systolic pressure. The tricuspid regurgitant  velocity is 3.24 m/s, and with an assumed right atrial pressure of 3 mmHg, the estimated right ventricular systolic pressure is 45.0 mmHg. Left Atrium: Left atrial size was severely dilated. Right Atrium: Right atrial size was mildly dilated. Pericardium: There is no evidence of pericardial effusion. Mitral Valve: The mitral valve is abnormal. There is mild thickening of the mitral valve leaflet(s). There is mild calcification of the mitral valve leaflet(s). Mild mitral annular calcification. Moderate mitral valve regurgitation. No evidence of mitral  valve stenosis. Tricuspid Valve: The tricuspid valve is abnormal. Tricuspid valve regurgitation is moderate . No evidence of tricuspid stenosis. Aortic Valve: The aortic valve is tricuspid. There is mild calcification of the  aortic valve. There is mild thickening of the aortic valve. There is mild aortic valve annular calcification. Aortic valve regurgitation is not visualized. Aortic valve mean gradient measures 3.5 mmHg. Aortic valve peak gradient measures 7.4 mmHg. Aortic valve area, by VTI measures 1.72 cm. Pulmonic Valve: The pulmonic valve was not well visualized. Pulmonic valve regurgitation is mild. No evidence of pulmonic stenosis. Aorta: The aortic root is normal in size and structure. Venous: The inferior vena cava is normal in size with greater than 50% respiratory variability, suggesting right atrial pressure of 3 mmHg. IAS/Shunts: No atrial level shunt detected by color flow Doppler.  LEFT VENTRICLE PLAX 2D LVIDd:         4.80 cm   Diastology LVIDs:         3.20 cm   LV e' medial:    8.95 cm/s LV PW:         0.90 cm   LV E/e' medial:  10.0 LV IVS:        0.80 cm   LV e' lateral:   12.10 cm/s LVOT diam:     1.90 cm   LV E/e' lateral: 7.4 LV SV:         42 LV SV Index:   24 LVOT Area:     2.84 cm  RIGHT VENTRICLE RV S prime:     15.70 cm/s TAPSE (M-mode): 1.7 cm LEFT ATRIUM             Index        RIGHT ATRIUM           Index LA diam:        3.80 cm 2.14 cm/m   RA Area:     22.70 cm LA Vol (A2C):   82.9 ml 46.77 ml/m  RA Volume:   69.20 ml  39.04 ml/m LA Vol (A4C):   87.9 ml 49.59 ml/m LA Biplane Vol: 86.3 ml 48.69 ml/m  AORTIC VALVE AV Area (Vmax):    1.90 cm AV Area (Vmean):   1.75 cm  AV Area (VTI):     1.72 cm AV Vmax:           136.00 cm/s AV Vmean:          87.565 cm/s AV VTI:            0.247 m AV Peak Grad:      7.4 mmHg AV Mean Grad:      3.5 mmHg LVOT Vmax:         91.00 cm/s LVOT Vmean:        54.100 cm/s LVOT VTI:          0.150 m LVOT/AV VTI ratio: 0.61  AORTA Ao Root diam: 3.30 cm MITRAL VALVE                  TRICUSPID VALVE MV Area (PHT): 4.06 cm       TR Peak grad:   42.0 mmHg MV Decel Time: 187 msec       TR Vmax:        324.00 cm/s MR Peak grad:    66.3 mmHg MR Mean grad:    41.0 mmHg    SHUNTS  MR Vmax:         407.00 cm/s  Systemic VTI:  0.15 m MR Vmean:        299.0 cm/s   Systemic Diam: 1.90 cm MR PISA:         2.26 cm MR PISA Eff ROA: 12 mm MR PISA Radius:  0.60 cm MV E velocity: 89.50 cm/s MV A velocity: 36.50 cm/s MV E/A ratio:  2.45 Dorn Ross MD Electronically signed by Dorn Ross MD Signature Date/Time: 09/18/2024/1:22:55 PM    Final    DG Foot 2 Views Left Result Date: 09/18/2024 EXAM: 2 VIEW(S) XRAY OF THE LEFT FOOT 09/18/2024 12:28:00 PM COMPARISON: None available. CLINICAL HISTORY: Left lower leg pain, slight swelling. Pt had limited ROM, pain with ROM. Best images attainable. FINDINGS: BONES AND JOINTS: No acute fracture. No focal osseous lesion. No joint dislocation. Hallux valgus deformity noted. Calcaneal spur along the plantar aponeurosis attachment site. Moderate degenerative changes of the talonavicular joint. SOFT TISSUES: Soft tissue swelling throughout the left foot. IMPRESSION: 1. No acute fracture. 2. Soft tissue swelling throughout the left foot. 3. Hallux valgus deformity. Electronically signed by: Ryan Chess MD 09/18/2024 12:57 PM EDT RP Workstation: [id number]   DG Ankle 2 Views Left Result Date: 09/18/2024 EXAM: 2 VIEW(S) XRAY OF THE LEFT ANKLE 09/18/2024 12:28:00 PM CLINICAL HISTORY: Left lower leg pain, slight swelling. Pt had limited ROM, pain with ROM. Best images attainable. COMPARISON: None available. FINDINGS: BONES AND JOINTS: No acute fracture. No focal osseous lesion. No joint dislocation. Plantar calcaneal spur. Moderate degenerative changes of the left ankle. SOFT TISSUES: The soft tissues are unremarkable. IMPRESSION: 1. No acute osseous abnormality. 2. Moderate degenerative changes of the left ankle. Electronically signed by: Ryan Chess MD 09/18/2024 12:54 PM EDT RP Workstation: [id number]   DG CHEST PORT 1 VIEW Result Date: 09/11/2024 CLINICAL DATA:  Hypotension. EXAM: PORTABLE CHEST 1 VIEW COMPARISON:  September 23, 2023. FINDINGS:  Stable cardiomediastinal silhouette. Minimal bibasilar subsegmental atelectasis or scarring is noted. Bony thorax is unremarkable. IMPRESSION: Minimal bibasilar subsegmental atelectasis or scarring. Electronically Signed   By: Lynwood Landy Raddle M.D.   On: 09/11/2024 11:24   CT ABDOMEN PELVIS WO CONTRAST Result Date: 09/10/2024 CLINICAL DATA:  Acute kidney injury assess for intra-abdominal infection EXAM: CT ABDOMEN AND PELVIS WITHOUT CONTRAST TECHNIQUE: Multidetector CT imaging  of the abdomen and pelvis was performed following the standard protocol without IV contrast. RADIATION DOSE REDUCTION: This exam was performed according to the departmental dose-optimization program which includes automated exposure control, adjustment of the mA and/or kV according to patient size and/or use of iterative reconstruction technique. COMPARISON:  None Available. FINDINGS: Lower chest: Lung bases demonstrate no acute airspace disease. Atelectasis at the lung bases. Multi-vessel coronary vascular calcification. Hepatobiliary: Motion degradation. Gallstones. No biliary dilatation. Pancreas: Unremarkable. No pancreatic ductal dilatation or surrounding inflammatory changes. Spleen: Normal in size without focal abnormality. Adrenals/Urinary Tract: Adrenal glands are normal. Kidneys show no hydronephrosis. Multiple cysts in the left kidney, no imaging follow-up is recommended. The bladder is unremarkable Stomach/Bowel: The stomach is within normal limits. No dilated small bowel. No acute bowel wall thickening. Negative appendix. Mild diverticular disease of the left colon Vascular/Lymphatic: Aortic atherosclerosis. No enlarged abdominal or pelvic lymph nodes. Reproductive: Hysterectomy.  No adnexal mass Other: Negative for pelvic effusion or free air. Moderate fat containing periumbilical hernia Musculoskeletal: No acute or suspicious osseous abnormality IMPRESSION: 1. No CT evidence for acute intra-abdominal or pelvic abnormality  allowing for motion degradation. 2. Gallstones. 3. Mild diverticular disease of the left colon without acute inflammatory process. 4. Aortic atherosclerosis. Aortic Atherosclerosis (ICD10-I70.0). Electronically Signed   By: Luke Bun M.D.   On: 09/10/2024 23:44   CT Head Wo Contrast Result Date: 09/09/2024 CLINICAL DATA:  Fall with neck pain hematoma to right eye and forehead EXAM: CT HEAD WITHOUT CONTRAST CT MAXILLOFACIAL WITHOUT CONTRAST CT CERVICAL SPINE WITHOUT CONTRAST TECHNIQUE: Multidetector CT imaging of the head, cervical spine, and maxillofacial structures were performed using the standard protocol without intravenous contrast. Multiplanar CT image reconstructions of the cervical spine and maxillofacial structures were also generated. RADIATION DOSE REDUCTION: This exam was performed according to the departmental dose-optimization program which includes automated exposure control, adjustment of the mA and/or kV according to patient size and/or use of iterative reconstruction technique. COMPARISON:  MRI 12/10/2023, CT brain and cervical spine 09/23/2023 FINDINGS: CT HEAD FINDINGS Brain: No acute territorial infarction, hemorrhage or intracranial mass. Advanced atrophy. Moderate severe chronic small vessel ischemic changes of the white matter. The ventricles are enlarged but stable in size, presumably due to atrophy. Vascular: No hyperdense vessels. Vertebral and carotid vascular calcification Skull: Normal. Negative for fracture or focal lesion. Other: None CT MAXILLOFACIAL FINDINGS Osseous: Slightly limited by motion degradation. Mastoid air cells are clear. No definitive mandibular fracture. Pterygoid plates and zygomatic arches are intact. No acute nasal bone fracture Orbits: Negative. No traumatic or inflammatory finding. Sinuses: Clear. Soft tissues: Negative. CT CERVICAL SPINE FINDINGS Alignment: No subluxation.  Facet alignment is normal Skull base and vertebrae: No acute fracture. Vertebral  body heights are grossly maintained Soft tissues and spinal canal: No prevertebral fluid or swelling. No visible canal hematoma. Disc levels: Advanced multilevel degenerative changes C3 through C7 with multilevel disc space narrowing, osteophyte and partial ankylosis. Irregular endplate erosive changes at C7-T1 with interval sclerosis, consistent with sequela of known osteomyelitis discitis. Multilevel facet degenerative change and ankylosis. Multilevel foraminal narrowing. Upper chest: Lung apices are clear.  Emphysema Other: None IMPRESSION: 1. No CT evidence for acute intracranial abnormality. Atrophy and chronic small vessel ischemic changes of the white matter. 2. No acute facial bone fracture allowing for motion degradation. 3. No acute osseous abnormality of the cervical spine. 4. 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 5. Emphysema Electronically Signed  By: Luke Bun M.D.   On: 09/09/2024 19:45   CT Maxillofacial Wo Contrast Result Date: 09/09/2024 CLINICAL DATA:  Fall with neck pain hematoma to right eye and forehead EXAM: CT HEAD WITHOUT CONTRAST CT MAXILLOFACIAL WITHOUT CONTRAST CT CERVICAL SPINE WITHOUT CONTRAST TECHNIQUE: Multidetector CT imaging of the head, cervical spine, and maxillofacial structures were performed using the standard protocol without intravenous contrast. Multiplanar CT image reconstructions of the cervical spine and maxillofacial structures were also generated. RADIATION DOSE REDUCTION: This exam was performed according to the departmental dose-optimization program which includes automated exposure control, adjustment of the mA and/or kV according to patient size and/or use of iterative reconstruction technique. COMPARISON:  MRI 12/10/2023, CT brain and cervical spine 09/23/2023 FINDINGS: CT HEAD FINDINGS Brain: No acute territorial infarction, hemorrhage or intracranial mass. Advanced  atrophy. Moderate severe chronic small vessel ischemic changes of the white matter. The ventricles are enlarged but stable in size, presumably due to atrophy. Vascular: No hyperdense vessels. Vertebral and carotid vascular calcification Skull: Normal. Negative for fracture or focal lesion. Other: None CT MAXILLOFACIAL FINDINGS Osseous: Slightly limited by motion degradation. Mastoid air cells are clear. No definitive mandibular fracture. Pterygoid plates and zygomatic arches are intact. No acute nasal bone fracture Orbits: Negative. No traumatic or inflammatory finding. Sinuses: Clear. Soft tissues: Negative. CT CERVICAL SPINE FINDINGS Alignment: No subluxation.  Facet alignment is normal Skull base and vertebrae: No acute fracture. Vertebral body heights are grossly maintained Soft tissues and spinal canal: No prevertebral fluid or swelling. No visible canal hematoma. Disc levels: Advanced multilevel degenerative changes C3 through C7 with multilevel disc space narrowing, osteophyte and partial ankylosis. Irregular endplate erosive changes at C7-T1 with interval sclerosis, consistent with sequela of known osteomyelitis discitis. Multilevel facet degenerative change and ankylosis. Multilevel foraminal narrowing. Upper chest: Lung apices are clear.  Emphysema Other: None IMPRESSION: 1. No CT evidence for acute intracranial abnormality. Atrophy and chronic small vessel ischemic changes of the white matter. 2. No acute facial bone fracture allowing for motion degradation. 3. No acute osseous abnormality of the cervical spine. 4. 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 5. Emphysema Electronically Signed   By: Luke Bun M.D.   On: 09/09/2024 19:45   CT Cervical Spine Wo Contrast Result Date: 09/09/2024 CLINICAL DATA:  Fall with neck pain hematoma to right eye and forehead EXAM: CT HEAD WITHOUT CONTRAST CT MAXILLOFACIAL WITHOUT  CONTRAST CT CERVICAL SPINE WITHOUT CONTRAST TECHNIQUE: Multidetector CT imaging of the head, cervical spine, and maxillofacial structures were performed using the standard protocol without intravenous contrast. Multiplanar CT image reconstructions of the cervical spine and maxillofacial structures were also generated. RADIATION DOSE REDUCTION: This exam was performed according to the departmental dose-optimization program which includes automated exposure control, adjustment of the mA and/or kV according to patient size and/or use of iterative reconstruction technique. COMPARISON:  MRI 12/10/2023, CT brain and cervical spine 09/23/2023 FINDINGS: CT HEAD FINDINGS Brain: No acute territorial infarction, hemorrhage or intracranial mass. Advanced atrophy. Moderate severe chronic small vessel ischemic changes of the white matter. The ventricles are enlarged but stable in size, presumably due to atrophy. Vascular: No hyperdense vessels. Vertebral and carotid vascular calcification Skull: Normal. Negative for fracture or focal lesion. Other: None CT MAXILLOFACIAL FINDINGS Osseous: Slightly limited by motion degradation. Mastoid air cells are clear. No definitive mandibular fracture. Pterygoid plates and zygomatic arches are intact. No acute nasal bone fracture Orbits: Negative. No traumatic or inflammatory  finding. Sinuses: Clear. Soft tissues: Negative. CT CERVICAL SPINE FINDINGS Alignment: No subluxation.  Facet alignment is normal Skull base and vertebrae: No acute fracture. Vertebral body heights are grossly maintained Soft tissues and spinal canal: No prevertebral fluid or swelling. No visible canal hematoma. Disc levels: Advanced multilevel degenerative changes C3 through C7 with multilevel disc space narrowing, osteophyte and partial ankylosis. Irregular endplate erosive changes at C7-T1 with interval sclerosis, consistent with sequela of known osteomyelitis discitis. Multilevel facet degenerative change and  ankylosis. Multilevel foraminal narrowing. Upper chest: Lung apices are clear.  Emphysema Other: None IMPRESSION: 1. No CT evidence for acute intracranial abnormality. Atrophy and chronic small vessel ischemic changes of the white matter. 2. No acute facial bone fracture allowing for motion degradation. 3. No acute osseous abnormality of the cervical spine. 4. 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 5. Emphysema Electronically Signed   By: Luke Bun M.D.   On: 09/09/2024 19:45    Microbiology: Results for orders placed or performed during the hospital encounter of 09/09/24  Urine Culture     Status: Abnormal   Collection Time: 09/09/24  8:00 PM   Specimen: Urine, Clean Catch  Result Value Ref Range Status   Specimen Description   Final    URINE, CLEAN CATCH Performed at [facility], [address] [place], [place] [address]    Special Requests   Final    NONE Performed at [facility], [address], [place], [place] [address]    Culture (A)  Final    >=100,000 COLONIES/mL ESCHERICHIA COLI Confirmed Extended Spectrum Beta-Lactamase Producer (ESBL).  In bloodstream infections from ESBL organisms, carbapenems are preferred over piperacillin/tazobactam. They are shown to have a lower risk of mortality.    Report Status 09/12/2024 FINAL  Final   Organism ID, Bacteria ESCHERICHIA COLI (A)  Final      Susceptibility   Escherichia coli - MIC*    AMPICILLIN >=32 RESISTANT Resistant     CEFAZOLIN (URINE) Value in next row Resistant      >=32 RESISTANTThis is a modified FDA-approved test that has been validated and its performance characteristics determined by the reporting laboratory.  This laboratory is certified under the Clinical Laboratory Improvement Amendments CLIA as qualified to perform high complexity clinical laboratory testing.    CEFEPIME  Value in next row Resistant      >=32 RESISTANTThis is a  modified FDA-approved test that has been validated and its performance characteristics determined by the reporting laboratory.  This laboratory is certified under the Clinical Laboratory Improvement Amendments CLIA as qualified to perform high complexity clinical laboratory testing.    ERTAPENEM Value in next row Sensitive      >=32 RESISTANTThis is a modified FDA-approved test that has been validated and its performance characteristics determined by the reporting laboratory.  This laboratory is certified under the Clinical Laboratory Improvement Amendments CLIA as qualified to perform high complexity clinical laboratory testing.    CEFTRIAXONE  Value in next row Resistant      >=32 RESISTANTThis is a modified FDA-approved test that has been validated and its performance characteristics determined by the reporting laboratory.  This laboratory is certified under the Clinical Laboratory Improvement Amendments CLIA as qualified to perform high complexity clinical laboratory testing.    CIPROFLOXACIN Value in next row Resistant      >=32 RESISTANTThis is a modified FDA-approved test that has been validated and its performance characteristics determined by the reporting  laboratory.  This laboratory is certified under the Clinical Laboratory Improvement Amendments CLIA as qualified to perform high complexity clinical laboratory testing.    GENTAMICIN Value in next row Sensitive      >=32 RESISTANTThis is a modified FDA-approved test that has been validated and its performance characteristics determined by the reporting laboratory.  This laboratory is certified under the Clinical Laboratory Improvement Amendments CLIA as qualified to perform high complexity clinical laboratory testing.    NITROFURANTOIN Value in next row Sensitive      >=32 RESISTANTThis is a modified FDA-approved test that has been validated and its performance characteristics determined by the reporting laboratory.  This laboratory is certified  under the Clinical Laboratory Improvement Amendments CLIA as qualified to perform high complexity clinical laboratory testing.    TRIMETH/SULFA Value in next row Resistant      >=32 RESISTANTThis is a modified FDA-approved test that has been validated and its performance characteristics determined by the reporting laboratory.  This laboratory is certified under the Clinical Laboratory Improvement Amendments CLIA as qualified to perform high complexity clinical laboratory testing.    AMPICILLIN/SULBACTAM Value in next row Resistant      >=32 RESISTANTThis is a modified FDA-approved test that has been validated and its performance characteristics determined by the reporting laboratory.  This laboratory is certified under the Clinical Laboratory Improvement Amendments CLIA as qualified to perform high complexity clinical laboratory testing.    PIP/TAZO Value in next row Sensitive ug/mL     8 SENSITIVEThis is a modified FDA-approved test that has been validated and its performance characteristics determined by the reporting laboratory.  This laboratory is certified under the Clinical Laboratory Improvement Amendments CLIA as qualified to perform high complexity clinical laboratory testing.    MEROPENEM  Value in next row Sensitive      8 SENSITIVEThis is a modified FDA-approved test that has been validated and its performance characteristics determined by the reporting laboratory.  This laboratory is certified under the Clinical Laboratory Improvement Amendments CLIA as qualified to perform high complexity clinical laboratory testing.    * >=100,000 COLONIES/mL ESCHERICHIA COLI  MRSA Next Gen by PCR, Nasal     Status: None   Collection Time: 09/10/24  3:10 PM   Specimen: Nasal Mucosa; Nasal Swab  Result Value Ref Range Status   MRSA by PCR Next Gen NOT DETECTED NOT DETECTED Final    Comment: (NOTE) The GeneXpert MRSA Assay (FDA approved for NASAL specimens only), is one component of a comprehensive MRSA  colonization surveillance program. It is not intended to diagnose MRSA infection nor to guide or monitor treatment for MRSA infections. Test performance is not FDA approved in patients less than 79 years old. Performed at [facility], [address] [place], [place] [address]   Culture, blood (Routine X 2) w Reflex to ID Panel     Status: None   Collection Time: 09/10/24  3:34 PM   Specimen: BLOOD  Result Value Ref Range Status   Specimen Description   Final    BLOOD LEFT ANTECUBITAL Performed at [facility], [address], [place], [place] [address]    Special Requests   Final    BOTTLES DRAWN AEROBIC ONLY Blood Culture adequate volume Performed at [facility], [address], [place], [place] [address]    Culture   Final    NO GROWTH 5 DAYS Performed at [facility], [address] [place], [place] [address]    Report Status 09/15/2024 FINAL  Final  Culture, blood (Routine X 2) w Reflex to ID Panel     Status: None   Collection Time: 09/10/24  3:34 PM   Specimen: BLOOD  Result Value Ref Range Status   Specimen Description   Final    BLOOD BLOOD LEFT HAND Performed at [facility], [address] [place], [place] [address]    Special Requests   Final    BOTTLES DRAWN AEROBIC ONLY Blood Culture results may not be optimal due to an inadequate volume of blood received in culture bottles Performed at [facility], [address] [place], [place] [address]    Culture   Final    NO GROWTH 5 DAYS Performed at [facility], [address] [place], [place] [address]    Report Status 09/15/2024 FINAL  Final    Labs: CBC: Recent Labs  Lab 09/12/24 0538 09/13/24 0544 09/14/24 0512 09/16/24 0442 09/18/24 0339  WBC 18.3* 11.1* 8.3 9.5 7.6  NEUTROABS 15.1* 7.9* 4.7  --   --   HGB 9.7* 9.5* 9.5* 10.1* 9.3*  HCT 31.5* 30.7* 31.5* 32.6* 30.0*  MCV 96.6 95.6 95.2 94.8 96.5  PLT 93* 79* 91* 119* 179   Basic Metabolic Panel: Recent Labs  Lab 09/13/24 0544  09/14/24 0512 09/16/24 0442 09/17/24 1045 09/18/24 0339  NA 139 143 143 143 144  K 3.4* 4.3 3.8 3.8 4.0  CL 108 116* 110 109 113*  CO2 22 23 26 24 26   GLUCOSE 154* 85 94 124* 74  BUN 23 21 21 20 18   CREATININE 1.28* 1.06* 1.04* 0.96 0.92  CALCIUM  8.3* 8.5* 8.5* 8.4* 8.1*  MG 1.6* 2.4  --  1.8 2.1   Liver Function Tests: No results for input(s): AST, ALT, ALKPHOS, BILITOT, PROT, ALBUMIN in the last 168 hours. CBG: Recent Labs  Lab 09/15/24 0734 09/15/24 1151 09/15/24 1641 09/16/24 0803 09/16/24 1140  GLUCAP 99 159* 119* 101* 145*    Discharge time spent: greater than 30 minutes.  Signed: Alm Schneider, MD [facility] 09/18/2024

## 2024-09-18 NOTE — Progress Notes (Signed)
 Report called to [facility] rehab. Patient awaiting transport via EMS, daughter at bedside.

## 2024-09-18 NOTE — Plan of Care (Signed)
  Problem: Clinical Measurements: Goal: Respiratory complications will improve Outcome: Progressing Goal: Cardiovascular complication will be avoided Outcome: Progressing   Problem: Coping: Goal: Level of anxiety will decrease Outcome: Progressing   Problem: Pain Managment: Goal: General experience of comfort will improve and/or be controlled Outcome: Progressing

## 2024-09-19 NOTE — Plan of Care (Signed)
 Problem: Education: Goal: Knowledge of General Education information will improve Description: Including pain rating scale, medication(s)/side effects and non-pharmacologic comfort measures 09/19/2024 0302 by Olene Corean CROME, RN Outcome: Adequate for Discharge 09/19/2024 0208 by Olene Corean CROME, RN Outcome: Adequate for Discharge   Problem: Health Behavior/Discharge Planning: Goal: Ability to manage health-related needs will improve 09/19/2024 0302 by Olene Corean CROME, RN Outcome: Adequate for Discharge 09/19/2024 0208 by Olene Corean CROME, RN Outcome: Adequate for Discharge   Problem: Clinical Measurements: Goal: Ability to maintain clinical measurements within normal limits will improve 09/19/2024 0302 by Olene Corean CROME, RN Outcome: Adequate for Discharge 09/19/2024 0208 by Olene Corean CROME, RN Outcome: Adequate for Discharge Goal: Will remain free from infection 09/19/2024 0302 by Olene Corean CROME, RN Outcome: Adequate for Discharge 09/19/2024 0208 by Olene Corean CROME, RN Outcome: Adequate for Discharge Goal: Diagnostic test results will improve 09/19/2024 0302 by Olene Corean CROME, RN Outcome: Adequate for Discharge 09/19/2024 0208 by Olene Corean CROME, RN Outcome: Adequate for Discharge Goal: Respiratory complications will improve 09/19/2024 0302 by Olene Corean CROME, RN Outcome: Adequate for Discharge 09/19/2024 0208 by Olene Corean CROME, RN Outcome: Adequate for Discharge Goal: Cardiovascular complication will be avoided 09/19/2024 0302 by Olene Corean CROME, RN Outcome: Adequate for Discharge 09/19/2024 0208 by Olene Corean CROME, RN Outcome: Adequate for Discharge   Problem: Activity: Goal: Risk for activity intolerance will decrease 09/19/2024 0302 by Olene Corean CROME, RN Outcome: Adequate for Discharge 09/19/2024 0208 by Olene Corean CROME, RN Outcome: Adequate for Discharge    Problem: Nutrition: Goal: Adequate nutrition will be maintained 09/19/2024 0302 by Olene Corean CROME, RN Outcome: Adequate for Discharge 09/19/2024 0208 by Olene Corean CROME, RN Outcome: Adequate for Discharge   Problem: Coping: Goal: Level of anxiety will decrease 09/19/2024 0302 by Olene Corean CROME, RN Outcome: Adequate for Discharge 09/19/2024 0208 by Olene Corean CROME, RN Outcome: Adequate for Discharge   Problem: Elimination: Goal: Will not experience complications related to bowel motility 09/19/2024 0302 by Olene Corean CROME, RN Outcome: Adequate for Discharge 09/19/2024 0208 by Olene Corean CROME, RN Outcome: Adequate for Discharge Goal: Will not experience complications related to urinary retention 09/19/2024 0302 by Olene Corean CROME, RN Outcome: Adequate for Discharge 09/19/2024 0208 by Olene Corean CROME, RN Outcome: Adequate for Discharge   Problem: Pain Managment: Goal: General experience of comfort will improve and/or be controlled 09/19/2024 0302 by Olene Corean CROME, RN Outcome: Adequate for Discharge 09/19/2024 0208 by Olene Corean CROME, RN Outcome: Adequate for Discharge   Problem: Safety: Goal: Ability to remain free from injury will improve 09/19/2024 0302 by Olene Corean CROME, RN Outcome: Adequate for Discharge 09/19/2024 0208 by Olene Corean CROME, RN Outcome: Adequate for Discharge   Problem: Skin Integrity: Goal: Risk for impaired skin integrity will decrease 09/19/2024 0302 by Olene Corean CROME, RN Outcome: Adequate for Discharge 09/19/2024 0208 by Olene Corean CROME, RN Outcome: Adequate for Discharge   Problem: Education: Goal: Ability to describe self-care measures that may prevent or decrease complications (Diabetes Survival Skills Education) will improve 09/19/2024 0302 by Olene Corean CROME, RN Outcome: Adequate for Discharge 09/19/2024 0208 by Olene Corean CROME,  RN Outcome: Adequate for Discharge Goal: Individualized Educational Video(s) 09/19/2024 0302 by Olene Corean CROME, RN Outcome: Adequate for Discharge 09/19/2024 0208 by Olene Corean CROME, RN Outcome: Adequate for Discharge   Problem: Coping: Goal: Ability to adjust to condition or change in health will improve 09/19/2024 0302 by Olene Corean CROME, RN Outcome: Adequate for Discharge 09/19/2024 0208 by  Olene Corean CROME, RN Outcome: Adequate for Discharge   Problem: Fluid Volume: Goal: Ability to maintain a balanced intake and output will improve 09/19/2024 0302 by Olene Corean CROME, RN Outcome: Adequate for Discharge 09/19/2024 0208 by Olene Corean CROME, RN Outcome: Adequate for Discharge   Problem: Health Behavior/Discharge Planning: Goal: Ability to identify and utilize available resources and services will improve 09/19/2024 0302 by Olene Corean CROME, RN Outcome: Adequate for Discharge 09/19/2024 0208 by Olene Corean CROME, RN Outcome: Adequate for Discharge Goal: Ability to manage health-related needs will improve 09/19/2024 0302 by Olene Corean CROME, RN Outcome: Adequate for Discharge 09/19/2024 0208 by Olene Corean CROME, RN Outcome: Adequate for Discharge   Problem: Metabolic: Goal: Ability to maintain appropriate glucose levels will improve 09/19/2024 0302 by Olene Corean CROME, RN Outcome: Adequate for Discharge 09/19/2024 0208 by Olene Corean CROME, RN Outcome: Adequate for Discharge   Problem: Nutritional: Goal: Maintenance of adequate nutrition will improve 09/19/2024 0302 by Olene Corean CROME, RN Outcome: Adequate for Discharge 09/19/2024 0208 by Olene Corean CROME, RN Outcome: Adequate for Discharge Goal: Progress toward achieving an optimal weight will improve 09/19/2024 0302 by Olene Corean CROME, RN Outcome: Adequate for Discharge 09/19/2024 0208 by Olene Corean CROME, RN Outcome: Adequate for  Discharge   Problem: Skin Integrity: Goal: Risk for impaired skin integrity will decrease 09/19/2024 0302 by Olene Corean CROME, RN Outcome: Adequate for Discharge 09/19/2024 0208 by Olene Corean CROME, RN Outcome: Adequate for Discharge   Problem: Tissue Perfusion: Goal: Adequacy of tissue perfusion will improve 09/19/2024 0302 by Olene Corean CROME, RN Outcome: Adequate for Discharge 09/19/2024 0208 by Olene Corean CROME, RN Outcome: Adequate for Discharge   Problem: Safety: Goal: Non-violent Restraint(s) 09/19/2024 0302 by Olene Corean CROME, RN Outcome: Adequate for Discharge 09/19/2024 0208 by Olene Corean CROME, RN Outcome: Adequate for Discharge

## 2024-09-19 NOTE — Progress Notes (Signed)
 Transport arrived to the facility to transport the patient back to [facility] where the patient resides at as the patient was discharged by the doctors during the previous shift. Patient took pm meds. Ivs were removed as well as telemetry by previous RN and previous RN also stated that she had already called report to [facility] and had the patients packet of paperwork already prepared and at the desk for the facility. Charge RN asked that this Clinical research associate call the patients daughter in the morning prior to shift change and inform the patients daughter of the patients transport back to the facility due to the daughter not wanting the patient to transfer this late. This Clinical research associate will attempt to notify the patients daughter in the am. Pt was discharged at this time and personal belongings were sent with patient back to facility.

## 2024-09-19 NOTE — Plan of Care (Signed)
  Problem: Education: Goal: Knowledge of General Education information will improve Description: Including pain rating scale, medication(s)/side effects and non-pharmacologic comfort measures Outcome: Adequate for Discharge   Problem: Health Behavior/Discharge Planning: Goal: Ability to manage health-related needs will improve Outcome: Adequate for Discharge   Problem: Clinical Measurements: Goal: Ability to maintain clinical measurements within normal limits will improve Outcome: Adequate for Discharge Goal: Will remain free from infection Outcome: Adequate for Discharge Goal: Diagnostic test results will improve Outcome: Adequate for Discharge Goal: Respiratory complications will improve Outcome: Adequate for Discharge Goal: Cardiovascular complication will be avoided Outcome: Adequate for Discharge   Problem: Activity: Goal: Risk for activity intolerance will decrease Outcome: Adequate for Discharge   Problem: Nutrition: Goal: Adequate nutrition will be maintained Outcome: Adequate for Discharge   Problem: Coping: Goal: Level of anxiety will decrease Outcome: Adequate for Discharge   Problem: Elimination: Goal: Will not experience complications related to bowel motility Outcome: Adequate for Discharge Goal: Will not experience complications related to urinary retention Outcome: Adequate for Discharge   Problem: Pain Managment: Goal: General experience of comfort will improve and/or be controlled Outcome: Adequate for Discharge   Problem: Safety: Goal: Ability to remain free from injury will improve Outcome: Adequate for Discharge   Problem: Skin Integrity: Goal: Risk for impaired skin integrity will decrease Outcome: Adequate for Discharge   Problem: Education: Goal: Ability to describe self-care measures that may prevent or decrease complications (Diabetes Survival Skills Education) will improve Outcome: Adequate for Discharge Goal: Individualized Educational  Video(s) Outcome: Adequate for Discharge   Problem: Coping: Goal: Ability to adjust to condition or change in health will improve Outcome: Adequate for Discharge   Problem: Fluid Volume: Goal: Ability to maintain a balanced intake and output will improve Outcome: Adequate for Discharge   Problem: Health Behavior/Discharge Planning: Goal: Ability to identify and utilize available resources and services will improve Outcome: Adequate for Discharge Goal: Ability to manage health-related needs will improve Outcome: Adequate for Discharge   Problem: Metabolic: Goal: Ability to maintain appropriate glucose levels will improve Outcome: Adequate for Discharge   Problem: Nutritional: Goal: Maintenance of adequate nutrition will improve Outcome: Adequate for Discharge Goal: Progress toward achieving an optimal weight will improve Outcome: Adequate for Discharge   Problem: Skin Integrity: Goal: Risk for impaired skin integrity will decrease Outcome: Adequate for Discharge   Problem: Tissue Perfusion: Goal: Adequacy of tissue perfusion will improve Outcome: Adequate for Discharge   Problem: Safety: Goal: Non-violent Restraint(s) Outcome: Adequate for Discharge

## 2024-09-21 DIAGNOSIS — R6521 Severe sepsis with septic shock: Secondary | ICD-10-CM | POA: Diagnosis not present

## 2024-09-21 DIAGNOSIS — Z7189 Other specified counseling: Secondary | ICD-10-CM | POA: Diagnosis not present

## 2024-09-21 DIAGNOSIS — G9341 Metabolic encephalopathy: Secondary | ICD-10-CM | POA: Diagnosis not present

## 2024-09-21 DIAGNOSIS — Z79899 Other long term (current) drug therapy: Secondary | ICD-10-CM | POA: Diagnosis not present

## 2024-09-21 DIAGNOSIS — N390 Urinary tract infection, site not specified: Secondary | ICD-10-CM | POA: Diagnosis not present

## 2024-09-21 DIAGNOSIS — A419 Sepsis, unspecified organism: Secondary | ICD-10-CM | POA: Diagnosis not present

## 2024-09-21 DIAGNOSIS — I480 Paroxysmal atrial fibrillation: Secondary | ICD-10-CM | POA: Diagnosis not present

## 2024-09-22 DIAGNOSIS — R269 Unspecified abnormalities of gait and mobility: Secondary | ICD-10-CM | POA: Diagnosis not present

## 2024-09-22 DIAGNOSIS — R296 Repeated falls: Secondary | ICD-10-CM | POA: Diagnosis not present

## 2024-09-22 DIAGNOSIS — M6281 Muscle weakness (generalized): Secondary | ICD-10-CM | POA: Diagnosis not present

## 2024-09-22 DIAGNOSIS — R1312 Dysphagia, oropharyngeal phase: Secondary | ICD-10-CM | POA: Diagnosis not present

## 2024-09-22 DIAGNOSIS — G309 Alzheimer's disease, unspecified: Secondary | ICD-10-CM | POA: Diagnosis not present

## 2024-09-22 DIAGNOSIS — R2689 Other abnormalities of gait and mobility: Secondary | ICD-10-CM | POA: Diagnosis not present

## 2024-09-23 ENCOUNTER — Emergency Department (HOSPITAL_COMMUNITY)
Admission: EM | Admit: 2024-09-23 | Discharge: 2024-09-24 | Disposition: A | Discharge status: Home / Self Care | Source: Skilled Nursing Facility | Attending: Emergency Medicine | Admitting: Emergency Medicine

## 2024-09-23 ENCOUNTER — Encounter (HOSPITAL_COMMUNITY): Payer: Self-pay

## 2024-09-23 ENCOUNTER — Emergency Department (HOSPITAL_COMMUNITY)

## 2024-09-23 ENCOUNTER — Other Ambulatory Visit: Payer: Self-pay

## 2024-09-23 DIAGNOSIS — R6521 Severe sepsis with septic shock: Secondary | ICD-10-CM | POA: Diagnosis not present

## 2024-09-23 DIAGNOSIS — M5090 Cervical disc disorder, unspecified, unspecified cervical region: Secondary | ICD-10-CM | POA: Diagnosis not present

## 2024-09-23 DIAGNOSIS — R1312 Dysphagia, oropharyngeal phase: Secondary | ICD-10-CM | POA: Diagnosis not present

## 2024-09-23 DIAGNOSIS — E119 Type 2 diabetes mellitus without complications: Secondary | ICD-10-CM | POA: Diagnosis not present

## 2024-09-23 DIAGNOSIS — W01198A Fall on same level from slipping, tripping and stumbling with subsequent striking against other object, initial encounter: Secondary | ICD-10-CM | POA: Diagnosis not present

## 2024-09-23 DIAGNOSIS — Z7901 Long term (current) use of anticoagulants: Secondary | ICD-10-CM | POA: Insufficient documentation

## 2024-09-23 DIAGNOSIS — R269 Unspecified abnormalities of gait and mobility: Secondary | ICD-10-CM | POA: Diagnosis not present

## 2024-09-23 DIAGNOSIS — M47812 Spondylosis without myelopathy or radiculopathy, cervical region: Secondary | ICD-10-CM | POA: Diagnosis not present

## 2024-09-23 DIAGNOSIS — W19XXXA Unspecified fall, initial encounter: Secondary | ICD-10-CM

## 2024-09-23 DIAGNOSIS — R220 Localized swelling, mass and lump, head: Secondary | ICD-10-CM | POA: Diagnosis not present

## 2024-09-23 DIAGNOSIS — I5032 Chronic diastolic (congestive) heart failure: Secondary | ICD-10-CM | POA: Diagnosis not present

## 2024-09-23 DIAGNOSIS — S199XXA Unspecified injury of neck, initial encounter: Secondary | ICD-10-CM | POA: Diagnosis not present

## 2024-09-23 DIAGNOSIS — S0232XA Fracture of orbital floor, left side, initial encounter for closed fracture: Secondary | ICD-10-CM | POA: Diagnosis not present

## 2024-09-23 DIAGNOSIS — S0240DA Maxillary fracture, left side, initial encounter for closed fracture: Secondary | ICD-10-CM | POA: Diagnosis not present

## 2024-09-23 DIAGNOSIS — S02842A Fracture of lateral orbital wall, left side, initial encounter for closed fracture: Secondary | ICD-10-CM | POA: Diagnosis not present

## 2024-09-23 DIAGNOSIS — R296 Repeated falls: Secondary | ICD-10-CM | POA: Diagnosis not present

## 2024-09-23 DIAGNOSIS — I10 Essential (primary) hypertension: Secondary | ICD-10-CM | POA: Insufficient documentation

## 2024-09-23 DIAGNOSIS — M6281 Muscle weakness (generalized): Secondary | ICD-10-CM | POA: Diagnosis not present

## 2024-09-23 DIAGNOSIS — Z79899 Other long term (current) drug therapy: Secondary | ICD-10-CM | POA: Insufficient documentation

## 2024-09-23 DIAGNOSIS — G309 Alzheimer's disease, unspecified: Secondary | ICD-10-CM | POA: Diagnosis not present

## 2024-09-23 DIAGNOSIS — S0292XA Unspecified fracture of facial bones, initial encounter for closed fracture: Secondary | ICD-10-CM | POA: Diagnosis not present

## 2024-09-23 DIAGNOSIS — S0990XA Unspecified injury of head, initial encounter: Secondary | ICD-10-CM | POA: Insufficient documentation

## 2024-09-23 DIAGNOSIS — Z043 Encounter for examination and observation following other accident: Secondary | ICD-10-CM | POA: Diagnosis not present

## 2024-09-23 DIAGNOSIS — N390 Urinary tract infection, site not specified: Secondary | ICD-10-CM | POA: Diagnosis not present

## 2024-09-23 DIAGNOSIS — R2689 Other abnormalities of gait and mobility: Secondary | ICD-10-CM | POA: Diagnosis not present

## 2024-09-23 DIAGNOSIS — I480 Paroxysmal atrial fibrillation: Secondary | ICD-10-CM | POA: Diagnosis not present

## 2024-09-23 LAB — CBC WITH DIFFERENTIAL/PLATELET
Abs Immature Granulocytes: 0.07 K/uL (ref 0.00–0.07)
Basophils Absolute: 0.1 K/uL (ref 0.0–0.1)
Basophils Relative: 1 %
Eosinophils Absolute: 0.3 K/uL (ref 0.0–0.5)
Eosinophils Relative: 2 %
HCT: 34.6 % — ABNORMAL LOW (ref 36.0–46.0)
Hemoglobin: 10.4 g/dL — ABNORMAL LOW (ref 12.0–15.0)
Immature Granulocytes: 1 %
Lymphocytes Relative: 20 %
Lymphs Abs: 2.2 K/uL (ref 0.7–4.0)
MCH: 29.4 pg (ref 26.0–34.0)
MCHC: 30.1 g/dL (ref 30.0–36.0)
MCV: 97.7 fL (ref 80.0–100.0)
Monocytes Absolute: 0.6 K/uL (ref 0.1–1.0)
Monocytes Relative: 5 %
Neutro Abs: 7.8 K/uL — ABNORMAL HIGH (ref 1.7–7.7)
Neutrophils Relative %: 71 %
Platelets: 302 K/uL (ref 150–400)
RBC: 3.54 MIL/uL — ABNORMAL LOW (ref 3.87–5.11)
RDW: 18 % — ABNORMAL HIGH (ref 11.5–15.5)
WBC: 11 K/uL — ABNORMAL HIGH (ref 4.0–10.5)
nRBC: 0 % (ref 0.0–0.2)

## 2024-09-23 LAB — COMPREHENSIVE METABOLIC PANEL WITH GFR
ALT: 12 U/L (ref 0–44)
AST: <10 U/L — ABNORMAL LOW (ref 15–41)
Albumin: 2 g/dL — ABNORMAL LOW (ref 3.5–5.0)
Alkaline Phosphatase: 103 U/L (ref 38–126)
Anion gap: 12 (ref 5–15)
BUN: 13 mg/dL (ref 8–23)
CO2: 26 mmol/L (ref 22–32)
Calcium: 8.1 mg/dL — ABNORMAL LOW (ref 8.9–10.3)
Chloride: 106 mmol/L (ref 98–111)
Creatinine, Ser: 1.04 mg/dL — ABNORMAL HIGH (ref 0.44–1.00)
GFR, Estimated: 54 mL/min — ABNORMAL LOW (ref >60)
Glucose, Bld: 98 mg/dL (ref 70–99)
Potassium: 3.9 mmol/L (ref 3.5–5.1)
Sodium: 144 mmol/L (ref 135–145)
Total Bilirubin: 0.6 mg/dL (ref 0.0–1.2)
Total Protein: 5.8 g/dL — ABNORMAL LOW (ref 6.5–8.1)

## 2024-09-23 LAB — CBG MONITORING, ED: Glucose-Capillary: 94 mg/dL (ref 70–99)

## 2024-09-23 LAB — PROTIME-INR
INR: 1.5 — ABNORMAL HIGH (ref 0.8–1.2)
Prothrombin Time: 18.4 s — ABNORMAL HIGH (ref 11.4–15.2)

## 2024-09-23 MED ORDER — LORAZEPAM 2 MG/ML IJ SOLN
1.0000 mg | Freq: Once | INTRAMUSCULAR | Status: AC
  Administered 2024-09-23: 1 mg via INTRAVENOUS
  Filled 2024-09-23: qty 1

## 2024-09-23 MED ORDER — TETRACAINE HCL 0.5 % OP SOLN
1.0000 [drp] | Freq: Once | OPHTHALMIC | Status: AC
  Administered 2024-09-24: 1 [drp] via OPHTHALMIC
  Filled 2024-09-23: qty 4

## 2024-09-23 MED ORDER — MORPHINE SULFATE (PF) 4 MG/ML IV SOLN
2.0000 mg | Freq: Once | INTRAVENOUS | Status: AC
  Administered 2024-09-23: 2 mg via INTRAVENOUS
  Filled 2024-09-23: qty 1

## 2024-09-23 NOTE — ED Provider Notes (Signed)
 [place] EMERGENCY DEPARTMENT AT [facility] Provider Note   CSN: [id number] Arrival date & time: 09/23/24  2056     History {Add pertinent medical, surgical, social history, OB history to HPI:1} Chief Complaint  Patient presents with  . Fall    Katelyn Ballard is a 79 y.o. female with PMH as listed below who presents brought in by [place] [facility] from a fall from [place].  Daughters at bedside who provides additional history.  Daughter states that patient fell out of her wheelchair Pt bib [facility] for a fall from [place]. EMS stated facility controlled the bleeding above her eye prior to their arrival. Left eye is swollen shut. Abrasion observed on left knee. Brusing also observed on left arm. Pt is alert to self.     .    Past Medical History:  Diagnosis Date  . Ankle fracture, right    1990's  . Diabetes mellitus without complication (HCC)   . Gout 2006  . Hyperlipidemia   . Hypertension   . Osteopenia        Home Medications Prior to Admission medications   Medication Sig Start Date End Date Taking? Authorizing Provider  acetaminophen  (TYLENOL ) 325 MG tablet Take 650 mg by mouth every 4 (four) hours as needed for mild pain (pain score 1-3).   Yes [provider]  allopurinol  (ZYLOPRIM ) 300 MG tablet Take 1 tablet (300 mg total) by mouth daily. Patient taking differently: Take 300 mg by mouth daily. 08/24/21  Yes Martin, Mary-Margaret, FNP  apixaban  (ELIQUIS ) 5 MG TABS tablet Take 1 tablet (5 mg total) by mouth 2 (two) times daily. 09/18/24  Yes Tat, Alm, MD  cetirizine (ZYRTEC) 10 MG tablet Take 10 mg by mouth daily as needed for allergies.   Yes [provider]  Cholecalciferol 50 MCG (2000 UT) CAPS Take 2,000 Units by mouth daily.   Yes [provider]  clonazePAM  (KLONOPIN ) 0.5 MG tablet Take 1 tablet (0.5 mg total) by mouth 3 (three) times daily as needed (anxiety). Patient taking differently: Take 0.5 mg by  mouth every 8 (eight) hours. 09/18/24  Yes Tat, Alm, MD  clopidogrel  (PLAVIX ) 75 MG tablet Take 75 mg by mouth in the morning.   Yes [provider]  diltiazem  (CARDIZEM  CD) 120 MG 24 hr capsule Take 1 capsule (120 mg total) by mouth daily. Patient taking differently: Take 120 mg by mouth daily. 09/18/24  Yes Tat, Alm, MD  divalproex  (DEPAKOTE  SPRINKLE) 125 MG capsule Take 375 mg by mouth 3 (three) times daily. 07/24/23  Yes [provider]  escitalopram  (LEXAPRO ) 10 MG tablet Take 10 mg by mouth daily. 07/24/23  Yes [provider]  furosemide  (LASIX ) 20 MG tablet Take 20 mg by mouth daily. 07/24/23  Yes [provider]  melatonin 3 MG TABS tablet Take 3 mg by mouth at bedtime.   Yes [provider]  Menthol-Zinc Oxide (CALMOSEPTINE) 0.44-20.6 % OINT Apply 1 Application topically in the morning, at noon, and at bedtime. Apply to inner buttocks, perineum every shift for moisture associated skin damage   Yes [provider]  methocarbamol (ROBAXIN) 500 MG tablet Take 500 mg by mouth every evening. 07/24/23  Yes [provider]  mirtazapine  (REMERON ) 15 MG tablet Take 15 mg by mouth at bedtime. 07/24/23  Yes [provider]  oxybutynin (DITROPAN) 5 MG tablet Take 5 mg by mouth at bedtime. 02/13/22  Yes [provider]  oxyCODONE  (OXY IR/ROXICODONE ) 5  MG immediate release tablet Take 5 mg by mouth daily at 2 PM.   Yes [provider]  oxyCODONE  (OXY IR/ROXICODONE ) 5 MG immediate release tablet Take 1 tablet (5 mg total) by mouth every 12 (twelve) hours as needed for severe pain (pain score 7-10). 09/18/24  Yes Tat, Alm, MD  rosuvastatin  (CRESTOR ) 40 MG tablet Take 40 mg by mouth every evening. 1700 07/24/23  Yes [provider]  sennosides-docusate sodium (SENOKOT-S) 8.6-50 MG tablet Take 2 tablets by mouth in the morning.   Yes [provider]  TRULICITY 0.75 MG/0.5ML SOPN Inject 0.75 mg into the  skin every Saturday. 08/31/23  Yes [provider]  blood glucose meter kit and supplies Check blood sugar bid and as needed Dx: E11.42 03/11/20   Gladis Mustard, FNP  blood glucose meter kit and supplies Dispense based on patient and insurance preference. Use up to four times daily as directed. (FOR ICD-10 E10.9, E11.9). 04/18/21   Gladis Mary-Margaret, FNP  feeding supplement (ENSURE PLUS HIGH PROTEIN) LIQD Take 237 mLs by mouth 2 (two) times daily between meals. Patient not taking: Reported on 09/23/2024 09/18/24   Tat, Alm, MD  glucose blood (ONETOUCH ULTRA) test strip USE 1 STRIP TO CHECK GLUCOSE 2 TO 3 TIMES DAILY 03/30/21   Gladis Mustard, FNP      Allergies    Atorvastatin , Penicillins, and Shellfish allergy    Review of Systems   Review of Systems A 10 point review of systems was performed and is negative unless otherwise reported in HPI.  Physical Exam Updated Vital Signs BP 112/66   Pulse 81   Temp 98.1 F (36.7 C) (Oral)   Resp 16   Ht 5' 8 (1.727 m)   Wt 64.9 kg   SpO2 96%   BMI 21.76 kg/m  Physical Exam General: Normal appearing {Desc; female/female:11659}, lying in bed.  HEENT: PERRLA, Sclera anicteric, MMM, trachea midline.  Cardiology: RRR, no murmurs/rubs/gallops. BL radial and DP pulses equal bilaterally.  Resp: Normal respiratory rate and effort. CTAB, no wheezes, rhonchi, crackles.  Abd: Soft, non-tender, non-distended. No rebound tenderness or guarding.  GU: Deferred. MSK: No peripheral edema or signs of trauma. Extremities without deformity or TTP. No cyanosis or clubbing. Skin: warm, dry. No rashes or lesions. Back: No CVA tenderness Neuro: A&Ox4, CNs II-XII grossly intact. MAEs. Sensation grossly intact.  Psych: Normal mood and affect.   ED Results / Procedures / Treatments   Labs (all labs ordered are listed, but only abnormal results are displayed) Labs Reviewed  CBC WITH DIFFERENTIAL/PLATELET - Abnormal; Notable for the  following components:      Result Value   WBC 11.0 (*)    RBC 3.54 (*)    Hemoglobin 10.4 (*)    HCT 34.6 (*)    RDW 18.0 (*)    Neutro Abs 7.8 (*)    All other components within normal limits  COMPREHENSIVE METABOLIC PANEL WITH GFR  URINALYSIS, ROUTINE W REFLEX MICROSCOPIC  PROTIME-INR  CBG MONITORING, ED    EKG EKG Interpretation Date/Time:  Wednesday September 23 2024 21:19:19 EDT Ventricular Rate:  93 PR Interval:    QRS Duration:  77 QT Interval:  358 QTC Calculation: 446 R Axis:   -5  Text Interpretation: Atrial fibrillation Borderline T abnormalities, diffuse leads Confirmed by Franklyn Gills [phone]) on 09/23/2024 10:02:31 PM  Radiology CT Cervical Spine Wo Contrast Result Date: 09/23/2024 CLINICAL DATA:  Neck trauma (Age >= 65y).  Fall. EXAM: CT CERVICAL SPINE WITHOUT CONTRAST  TECHNIQUE: Multidetector CT imaging of the cervical spine was performed without intravenous contrast. Multiplanar CT image reconstructions were also generated. RADIATION DOSE REDUCTION: This exam was performed according to the departmental dose-optimization program which includes automated exposure control, adjustment of the mA and/or kV according to patient size and/or use of iterative reconstruction technique. COMPARISON:  09/09/2024 FINDINGS: Alignment: Normal Skull base and vertebrae: No acute fracture. No primary bone lesion or focal pathologic process. Soft tissues and spinal canal: No prevertebral fluid or swelling. No visible canal hematoma. Disc levels: Advanced multi level degenerative changes C3 through C7 and diffuse bilateral facet disease. Upper chest: No acute findings Other: None IMPRESSION: Advanced multilevel degenerative changes. No acute bony abnormality. Electronically Signed   By: Franky Crease M.D.   On: 09/23/2024 21:57   CT Maxillofacial Wo Contrast Result Date: 09/23/2024 CLINICAL DATA:  Facial trauma, blunt.  Fall. EXAM: CT MAXILLOFACIAL WITHOUT CONTRAST TECHNIQUE: Multidetector  CT imaging of the maxillofacial structures was performed. Multiplanar CT image reconstructions were also generated. RADIATION DOSE REDUCTION: This exam was performed according to the departmental dose-optimization program which includes automated exposure control, adjustment of the mA and/or kV according to patient size and/or use of iterative reconstruction technique. COMPARISON:  09/09/2024 FINDINGS: Osseous: Fracture through the posterior wall of the left maxillary sinus. Zygomatic arches and mandible intact. Orbits: Fracture through the left orbital floor and inferior orbital rim. No evidence of entrapment. Globes are intact. Fracture through the lateral orbital wall. Sinuses: Air-fluid level in the left maxillary sinus. Soft tissues: Soft tissue swelling over the left forehead and orbit. Limited intracranial: See head CT report. IMPRESSION: Fractures through the left lateral orbital wall, left orbital floor and inferior orbital rim. Fracture through the posterior wall of the left maxillary sinus. Air-fluid level in the left maxillary sinus. Electronically Signed   By: Franky Crease M.D.   On: 09/23/2024 21:56   CT Head Wo Contrast Result Date: 09/23/2024 CLINICAL DATA:  Head trauma, minor (Age >= 65y).  Fall. EXAM: CT HEAD WITHOUT CONTRAST TECHNIQUE: Contiguous axial images were obtained from the base of the skull through the vertex without intravenous contrast. RADIATION DOSE REDUCTION: This exam was performed according to the departmental dose-optimization program which includes automated exposure control, adjustment of the mA and/or kV according to patient size and/or use of iterative reconstruction technique. COMPARISON:  09/23/2024 FINDINGS: Brain: There is atrophy and chronic small vessel disease changes. No acute intracranial abnormality. Specifically, no hemorrhage, hydrocephalus, mass lesion, acute infarction, or significant intracranial injury. Vascular: No hyperdense vessel or unexpected  calcification. Skull: No acute calvarial abnormality. Sinuses/Orbits: Soft tissue swelling over the left orbit. Fracture through the floor of the left orbit and through the posterior wall of the left maxillary sinus. Zygomatic arches intact. Globe is intact. Air-fluid level in the left maxillary sinus. Other: Soft tissue swelling over the left forehead and orbit. IMPRESSION: Atrophy, chronic microvascular disease. No acute intracranial abnormality. Left orbital floor and posterior maxillary wall fractures. See maxillofacial CT report. Soft tissue swelling over the left orbit and forehead. Electronically Signed   By: Franky Crease M.D.   On: 09/23/2024 21:54    Procedures Procedures  {Document cardiac monitor, telemetry assessment procedure when appropriate:1}  Medications Ordered in ED Medications  morphine  (PF) 4 MG/ML injection 2 mg (has no administration in time range)  tetracaine  (PONTOCAINE) 0.5 % ophthalmic solution 1 drop (has no administration in time range)    ED Course/ Medical Decision Making/ A&P  Medical Decision Making Amount and/or Complexity of Data Reviewed Labs: ordered. Radiology: ordered. Decision-making details documented in ED Course.  Risk Prescription drug management.    This patient presents to the ED for concern of ***, this involves an extensive number of treatment options, and is a complaint that carries with it a high risk of complications and morbidity.  I considered the following differential and admission for this acute, potentially life threatening condition.   MDM:    ***  Clinical Course as of 09/26/24 1028  Wed Sep 23, 2024  2201 CT Cervical Spine Wo Contrast Advanced multilevel degenerative changes. No acute bony abnormality. [HN]  2202 CT Maxillofacial Wo Contrast Fractures through the left lateral orbital wall, left orbital floor and inferior orbital rim.  Fracture through the posterior wall of the left maxillary  sinus.  Air-fluid level in the left maxillary sinus.   [HN]  2202 CT Head Wo Contrast Atrophy, chronic microvascular disease.  No acute intracranial abnormality.  Left orbital floor and posterior maxillary wall fractures. See maxillofacial CT report. Soft tissue swelling over the left orbit and forehead.   [HN]  2356 Able to obtain left intraocular pressure after morphine  and Ativan  IV.  OS IOP 28 mmHg. Will consult to ENT and ophtho. [HN]  Thu Sep 24, 2024  0012 Ophtho states can f/u in clinic [HN]    Clinical Course User Index [HN] Franklyn Sid SAILOR, MD    Labs: I Ordered, and personally interpreted labs.  The pertinent results include:  ***  Imaging Studies ordered: I ordered imaging studies including *** I independently visualized and interpreted imaging. I agree with the radiologist interpretation  Additional history obtained from ***.  External records from outside source obtained and reviewed including ***  Cardiac Monitoring: .The patient was maintained on a cardiac monitor.  I personally viewed and interpreted the cardiac monitored which showed an underlying rhythm of: ***  Reevaluation: After the interventions noted above, I reevaluated the patient and found that they have :{resolved/improved/worsened:23923::improved}  Social Determinants of Health: .***  Disposition:  ***  Co morbidities that complicate the patient evaluation . Past Medical History:  Diagnosis Date  . Ankle fracture, right    1990's  . Diabetes mellitus without complication (HCC)   . Gout 2006  . Hyperlipidemia   . Hypertension   . Osteopenia      Medicines Meds ordered this encounter  Medications  . morphine  (PF) 4 MG/ML injection 2 mg    Refill:  0  . tetracaine  (PONTOCAINE) 0.5 % ophthalmic solution 1 drop    I have reviewed the patients home medicines and have made adjustments as needed  Problem List / ED Course: Problem List Items Addressed This Visit   None         {Document critical care time when appropriate:1} {Document review of labs and clinical decision tools ie heart score, Chads2Vasc2 etc:1}  {Document your independent review of radiology images, and any outside records:1} {Document your discussion with family members, caretakers, and with consultants:1} {Document social determinants of health affecting pt's care:1} {Document your decision making why or why not admission, treatments were needed:1}  This note was created using dictation software, which may contain spelling or grammatical errors.

## 2024-09-23 NOTE — ED Notes (Signed)
 Pt is calling out for her mom and dad. Pt asked staff is we would let her parents know she is here.

## 2024-09-23 NOTE — ED Triage Notes (Signed)
 Pt bib Katelyn Ballard for a fall from [employer]. EMS stated facility controlled the bleeding above her eye prior to their arrival. Left eye is swollen shut. Abrasion observed on left knee. Brusing also observed on left arm. Pt is alert to self.

## 2024-09-24 DIAGNOSIS — W19XXXD Unspecified fall, subsequent encounter: Secondary | ICD-10-CM | POA: Diagnosis not present

## 2024-09-24 DIAGNOSIS — R2689 Other abnormalities of gait and mobility: Secondary | ICD-10-CM | POA: Diagnosis not present

## 2024-09-24 DIAGNOSIS — Z7401 Bed confinement status: Secondary | ICD-10-CM | POA: Diagnosis not present

## 2024-09-24 DIAGNOSIS — R296 Repeated falls: Secondary | ICD-10-CM | POA: Diagnosis not present

## 2024-09-24 DIAGNOSIS — S0990XA Unspecified injury of head, initial encounter: Secondary | ICD-10-CM | POA: Diagnosis not present

## 2024-09-24 DIAGNOSIS — R52 Pain, unspecified: Secondary | ICD-10-CM | POA: Diagnosis not present

## 2024-09-24 DIAGNOSIS — M6281 Muscle weakness (generalized): Secondary | ICD-10-CM | POA: Diagnosis not present

## 2024-09-24 DIAGNOSIS — R404 Transient alteration of awareness: Secondary | ICD-10-CM | POA: Diagnosis not present

## 2024-09-24 DIAGNOSIS — R269 Unspecified abnormalities of gait and mobility: Secondary | ICD-10-CM | POA: Diagnosis not present

## 2024-09-24 DIAGNOSIS — R1312 Dysphagia, oropharyngeal phase: Secondary | ICD-10-CM | POA: Diagnosis not present

## 2024-09-24 DIAGNOSIS — Z79899 Other long term (current) drug therapy: Secondary | ICD-10-CM | POA: Diagnosis not present

## 2024-09-24 DIAGNOSIS — S0083XD Contusion of other part of head, subsequent encounter: Secondary | ICD-10-CM | POA: Diagnosis not present

## 2024-09-24 DIAGNOSIS — G309 Alzheimer's disease, unspecified: Secondary | ICD-10-CM | POA: Diagnosis not present

## 2024-09-24 NOTE — Discharge Instructions (Signed)
 Continue medications as previously prescribed.  Follow-up with ENT in the next week.  The contact information for [facility] has been provided in this discharge summary for you to call and make these arrangements.

## 2024-09-24 NOTE — ED Provider Notes (Signed)
  Physical Exam  BP (!) 116/94   Pulse 87   Temp 98.1 F (36.7 C) (Oral)   Resp (!) 21   Ht 5' 8 (1.727 m)   Wt 64.9 kg   SpO2 95%   BMI 21.76 kg/m   Physical Exam Vitals and nursing note reviewed.  Constitutional:      Appearance: Normal appearance.  Pulmonary:     Effort: Pulmonary effort is normal.  Neurological:     Mental Status: She is alert.     Procedures  Procedures  ED Course / MDM   Clinical Course as of 09/24/24 0117  Wed Sep 23, 2024  2201 CT Cervical Spine Wo Contrast Advanced multilevel degenerative changes. No acute bony abnormality. [HN]  2202 CT Maxillofacial Wo Contrast Fractures through the left lateral orbital wall, left orbital floor and inferior orbital rim.  Fracture through the posterior wall of the left maxillary sinus.  Air-fluid level in the left maxillary sinus.   [HN]  2202 CT Head Wo Contrast Atrophy, chronic microvascular disease.  No acute intracranial abnormality.  Left orbital floor and posterior maxillary wall fractures. See maxillofacial CT report. Soft tissue swelling over the left orbit and forehead.   [HN]  2356 Able to obtain left intraocular pressure after morphine  and Ativan  IV.  OS IOP 28 mmHg. Will consult to ENT and ophtho. [HN]  Thu Sep 24, 2024  0012 Ophtho states can f/u in clinic [HN]    Clinical Course User Index [HN] Franklyn Sid SAILOR, MD   Medical Decision Making Amount and/or Complexity of Data Reviewed Labs: ordered. Radiology: ordered. Decision-making details documented in ED Course.  Risk Prescription drug management.    Care assumed from Dr. Franklyn at shift change.  Patient sent from her extended care facility after a fall.  Imaging studies revealed fractures of the lateral wall of the orbit, inferior wall of the orbit, and maxillary sinus.  Patient has been observed and is now resting comfortably.  I do not feel as though any further workup is indicated at this time.  Patient to be  discharged with fall precautions and as needed return.  I have not received a return call from the ENT, but facial fractures appear nondisplaced and nonsurgical.  Patient can be followed up as an outpatient.     Geroldine Berg, MD 09/24/24 [place]

## 2024-09-25 DIAGNOSIS — R269 Unspecified abnormalities of gait and mobility: Secondary | ICD-10-CM | POA: Diagnosis not present

## 2024-09-25 DIAGNOSIS — R296 Repeated falls: Secondary | ICD-10-CM | POA: Diagnosis not present

## 2024-09-25 DIAGNOSIS — R1312 Dysphagia, oropharyngeal phase: Secondary | ICD-10-CM | POA: Diagnosis not present

## 2024-09-25 DIAGNOSIS — M6281 Muscle weakness (generalized): Secondary | ICD-10-CM | POA: Diagnosis not present

## 2024-09-25 DIAGNOSIS — R2689 Other abnormalities of gait and mobility: Secondary | ICD-10-CM | POA: Diagnosis not present

## 2024-09-25 DIAGNOSIS — G309 Alzheimer's disease, unspecified: Secondary | ICD-10-CM | POA: Diagnosis not present

## 2024-09-28 DIAGNOSIS — R269 Unspecified abnormalities of gait and mobility: Secondary | ICD-10-CM | POA: Diagnosis not present

## 2024-09-28 DIAGNOSIS — G309 Alzheimer's disease, unspecified: Secondary | ICD-10-CM | POA: Diagnosis not present

## 2024-09-28 DIAGNOSIS — R1312 Dysphagia, oropharyngeal phase: Secondary | ICD-10-CM | POA: Diagnosis not present

## 2024-09-28 DIAGNOSIS — R296 Repeated falls: Secondary | ICD-10-CM | POA: Diagnosis not present

## 2024-09-28 DIAGNOSIS — Z79899 Other long term (current) drug therapy: Secondary | ICD-10-CM | POA: Diagnosis not present

## 2024-09-28 DIAGNOSIS — A498 Other bacterial infections of unspecified site: Secondary | ICD-10-CM | POA: Diagnosis not present

## 2024-09-28 DIAGNOSIS — M6281 Muscle weakness (generalized): Secondary | ICD-10-CM | POA: Diagnosis not present

## 2024-09-28 DIAGNOSIS — R2689 Other abnormalities of gait and mobility: Secondary | ICD-10-CM | POA: Diagnosis not present

## 2024-09-29 DIAGNOSIS — R1312 Dysphagia, oropharyngeal phase: Secondary | ICD-10-CM | POA: Diagnosis not present

## 2024-09-29 DIAGNOSIS — G309 Alzheimer's disease, unspecified: Secondary | ICD-10-CM | POA: Diagnosis not present

## 2024-09-29 DIAGNOSIS — R296 Repeated falls: Secondary | ICD-10-CM | POA: Diagnosis not present

## 2024-09-29 DIAGNOSIS — R269 Unspecified abnormalities of gait and mobility: Secondary | ICD-10-CM | POA: Diagnosis not present

## 2024-09-29 DIAGNOSIS — R2689 Other abnormalities of gait and mobility: Secondary | ICD-10-CM | POA: Diagnosis not present

## 2024-09-29 DIAGNOSIS — M6281 Muscle weakness (generalized): Secondary | ICD-10-CM | POA: Diagnosis not present

## 2024-10-07 ENCOUNTER — Ambulatory Visit: Admitting: Internal Medicine

## 2024-10-28 ENCOUNTER — Ambulatory Visit: Admitting: Internal Medicine
